# Patient Record
Sex: Male | Born: 1940 | Race: White | Hispanic: No | Smoking: Light tobacco smoker
Health system: Southern US, Community
[De-identification: ages and names within clinical notes are randomized; demographics above are authoritative.]

## PROBLEM LIST (undated history)

## (undated) DIAGNOSIS — F419 Anxiety disorder, unspecified: Secondary | ICD-10-CM

## (undated) DIAGNOSIS — G4730 Sleep apnea, unspecified: Secondary | ICD-10-CM

## (undated) DIAGNOSIS — J302 Other seasonal allergic rhinitis: Secondary | ICD-10-CM

## (undated) DIAGNOSIS — J42 Unspecified chronic bronchitis: Secondary | ICD-10-CM

## (undated) DIAGNOSIS — Z5189 Encounter for other specified aftercare: Secondary | ICD-10-CM

## (undated) DIAGNOSIS — Z87442 Personal history of urinary calculi: Secondary | ICD-10-CM

## (undated) DIAGNOSIS — H269 Unspecified cataract: Secondary | ICD-10-CM

## (undated) DIAGNOSIS — J449 Chronic obstructive pulmonary disease, unspecified: Secondary | ICD-10-CM

## (undated) DIAGNOSIS — I350 Nonrheumatic aortic (valve) stenosis: Secondary | ICD-10-CM

## (undated) DIAGNOSIS — M1990 Unspecified osteoarthritis, unspecified site: Secondary | ICD-10-CM

## (undated) DIAGNOSIS — N400 Enlarged prostate without lower urinary tract symptoms: Secondary | ICD-10-CM

## (undated) DIAGNOSIS — I2699 Other pulmonary embolism without acute cor pulmonale: Secondary | ICD-10-CM

## (undated) DIAGNOSIS — I2510 Atherosclerotic heart disease of native coronary artery without angina pectoris: Secondary | ICD-10-CM

## (undated) DIAGNOSIS — K635 Polyp of colon: Secondary | ICD-10-CM

## (undated) DIAGNOSIS — K219 Gastro-esophageal reflux disease without esophagitis: Secondary | ICD-10-CM

## (undated) DIAGNOSIS — I10 Essential (primary) hypertension: Secondary | ICD-10-CM

## (undated) DIAGNOSIS — T4145XA Adverse effect of unspecified anesthetic, initial encounter: Secondary | ICD-10-CM

## (undated) DIAGNOSIS — T8859XA Other complications of anesthesia, initial encounter: Secondary | ICD-10-CM

## (undated) DIAGNOSIS — N189 Chronic kidney disease, unspecified: Secondary | ICD-10-CM

## (undated) DIAGNOSIS — C4490 Unspecified malignant neoplasm of skin, unspecified: Secondary | ICD-10-CM

## (undated) DIAGNOSIS — E785 Hyperlipidemia, unspecified: Secondary | ICD-10-CM

## (undated) HISTORY — PX: SKIN SURGERY: SHX2413

## (undated) HISTORY — DX: Hyperlipidemia, unspecified: E78.5

## (undated) HISTORY — DX: Unspecified cataract: H26.9

## (undated) HISTORY — PX: SKIN CANCER EXCISION: SHX779

## (undated) HISTORY — DX: Encounter for other specified aftercare: Z51.89

## (undated) HISTORY — DX: Chronic kidney disease, unspecified: N18.9

## (undated) HISTORY — DX: Polyp of colon: K63.5

## (undated) HISTORY — DX: Unspecified malignant neoplasm of skin, unspecified: C44.90

## (undated) HISTORY — DX: Essential (primary) hypertension: I10

## (undated) HISTORY — DX: Nonrheumatic aortic (valve) stenosis: I35.0

## (undated) HISTORY — DX: Other pulmonary embolism without acute cor pulmonale: I26.99

## (undated) HISTORY — PX: RHINOPLASTY: SUR1284

## (undated) HISTORY — DX: Gastro-esophageal reflux disease without esophagitis: K21.9

## (undated) HISTORY — DX: Atherosclerotic heart disease of native coronary artery without angina pectoris: I25.10

## (undated) HISTORY — PX: OTHER SURGICAL HISTORY: SHX169

## (undated) HISTORY — PX: REPLACEMENT TOTAL KNEE: SUR1224

## (undated) HISTORY — PX: EYE SURGERY: SHX253

## (undated) HISTORY — PX: COLONOSCOPY: SHX174

---

## 1997-10-07 ENCOUNTER — Ambulatory Visit (HOSPITAL_COMMUNITY): Admission: RE | Admit: 1997-10-07 | Discharge: 1997-10-07 | Payer: Self-pay | Admitting: *Deleted

## 1997-12-14 ENCOUNTER — Ambulatory Visit: Admission: RE | Admit: 1997-12-14 | Discharge: 1997-12-14 | Payer: Self-pay | Admitting: Internal Medicine

## 1998-05-01 ENCOUNTER — Ambulatory Visit: Admission: RE | Admit: 1998-05-01 | Discharge: 1998-05-01 | Payer: Self-pay | Admitting: Otolaryngology

## 1998-05-20 HISTORY — PX: OTHER SURGICAL HISTORY: SHX169

## 1998-06-20 ENCOUNTER — Ambulatory Visit (HOSPITAL_COMMUNITY): Admission: RE | Admit: 1998-06-20 | Discharge: 1998-06-21 | Payer: Self-pay | Admitting: Otolaryngology

## 1998-09-12 ENCOUNTER — Ambulatory Visit: Admission: RE | Admit: 1998-09-12 | Discharge: 1998-09-12 | Payer: Self-pay | Admitting: Otolaryngology

## 2001-02-03 ENCOUNTER — Encounter (INDEPENDENT_AMBULATORY_CARE_PROVIDER_SITE_OTHER): Payer: Self-pay | Admitting: *Deleted

## 2001-02-03 ENCOUNTER — Ambulatory Visit (HOSPITAL_COMMUNITY): Admission: RE | Admit: 2001-02-03 | Discharge: 2001-02-03 | Payer: Self-pay | Admitting: Gastroenterology

## 2004-03-16 ENCOUNTER — Encounter (INDEPENDENT_AMBULATORY_CARE_PROVIDER_SITE_OTHER): Payer: Self-pay | Admitting: *Deleted

## 2004-03-16 ENCOUNTER — Ambulatory Visit (HOSPITAL_COMMUNITY): Admission: RE | Admit: 2004-03-16 | Discharge: 2004-03-16 | Payer: Self-pay | Admitting: *Deleted

## 2007-03-11 ENCOUNTER — Ambulatory Visit (HOSPITAL_COMMUNITY): Admission: RE | Admit: 2007-03-11 | Discharge: 2007-03-11 | Payer: Self-pay | Admitting: Otolaryngology

## 2007-03-11 ENCOUNTER — Encounter (INDEPENDENT_AMBULATORY_CARE_PROVIDER_SITE_OTHER): Payer: Self-pay | Admitting: Otolaryngology

## 2007-08-14 ENCOUNTER — Encounter (INDEPENDENT_AMBULATORY_CARE_PROVIDER_SITE_OTHER): Payer: Self-pay | Admitting: Otolaryngology

## 2007-08-14 ENCOUNTER — Ambulatory Visit (HOSPITAL_COMMUNITY): Admission: RE | Admit: 2007-08-14 | Discharge: 2007-08-15 | Payer: Self-pay | Admitting: Otolaryngology

## 2008-10-10 ENCOUNTER — Encounter: Admission: RE | Admit: 2008-10-10 | Discharge: 2008-10-10 | Payer: Self-pay | Admitting: Orthopedic Surgery

## 2009-02-16 DIAGNOSIS — G4733 Obstructive sleep apnea (adult) (pediatric): Secondary | ICD-10-CM | POA: Insufficient documentation

## 2009-02-16 DIAGNOSIS — K219 Gastro-esophageal reflux disease without esophagitis: Secondary | ICD-10-CM | POA: Insufficient documentation

## 2009-02-16 DIAGNOSIS — E785 Hyperlipidemia, unspecified: Secondary | ICD-10-CM | POA: Insufficient documentation

## 2009-02-16 DIAGNOSIS — E669 Obesity, unspecified: Secondary | ICD-10-CM | POA: Insufficient documentation

## 2009-07-06 DIAGNOSIS — J302 Other seasonal allergic rhinitis: Secondary | ICD-10-CM | POA: Insufficient documentation

## 2009-08-24 ENCOUNTER — Encounter (INDEPENDENT_AMBULATORY_CARE_PROVIDER_SITE_OTHER): Payer: Self-pay | Admitting: *Deleted

## 2009-09-19 ENCOUNTER — Encounter: Payer: Self-pay | Admitting: Gastroenterology

## 2009-09-20 ENCOUNTER — Encounter (INDEPENDENT_AMBULATORY_CARE_PROVIDER_SITE_OTHER): Payer: Self-pay | Admitting: *Deleted

## 2009-09-20 ENCOUNTER — Ambulatory Visit: Payer: Self-pay | Admitting: Gastroenterology

## 2009-10-06 ENCOUNTER — Ambulatory Visit: Payer: Self-pay | Admitting: Gastroenterology

## 2009-10-10 ENCOUNTER — Encounter: Payer: Self-pay | Admitting: Gastroenterology

## 2010-05-20 HISTORY — PX: JOINT REPLACEMENT: SHX530

## 2010-06-21 NOTE — Letter (Signed)
Summary: Patient Notice- Polyp Results   Gastroenterology  [address] [place], [place] [address]   Phone: [phone]  Fax: [phone]        Oct 10, 2009 MRN: [id number]    [name] [address] [place], [place]  [address]    Dear [name],  I am pleased to inform you that the colon polyp(s) removed during your recent colonoscopy was (were) found to be benign (no cancer detected) upon pathologic examination.  I recommend you have a repeat colonoscopy examination in _5 years to look for recurrent polyps, as having colon polyps increases your risk for having recurrent polyps or even colon cancer in the future.  Should you develop new or worsening symptoms of abdominal pain, bowel habit changes or bleeding from the rectum or bowels, please schedule an evaluation with either your primary care physician or with me.  Additional information/recommendations:  __ No further action with gastroenterology is needed at this time. Please      follow-up with your primary care physician for your other healthcare      needs.  __ Please call [phone] to schedule a return visit to review your      situation.  __ Please keep your follow-up visit as already scheduled.  _x_ Continue treatment plan as outlined the day of your exam.  Please call us if you are having persistent problems or have questions about your condition that have not been fully answered at this time.  Sincerely,  Louis Meckel MD  This letter has been electronically signed by your physician.  Appended Document: Patient Notice- Polyp Results letter mailed.

## 2010-06-21 NOTE — Letter (Signed)
Summary: [facility] Instructions  [facility]  [address] [place], [place] [address]   Phone: [phone]  Fax: [phone]       [name]    [birth year]    MRN: [id number]        Procedure Day Dorna Bloom:  Farrell Ours  10/06/09     Arrival Time:  7:30AM      Procedure Time:  8:00AM     Location of Procedure:                    Juliann Pares  [facility] (4th Floor)                        PREPARATION FOR COLONOSCOPY WITH MOVIPREP   Starting 5 days prior to your procedure 10/01/09 do not eat nuts, seeds, popcorn, corn, beans, peas,  salads, or any raw vegetables.  Do not take any fiber supplements (e.g. Metamucil, Citrucel, and Benefiber).  THE DAY BEFORE YOUR PROCEDURE         DATE: 10/05/09  DAY: THURSDAY  1.  Drink clear liquids the entire day-NO SOLID FOOD  2.  Do not drink anything colored red or purple.  Avoid juices with pulp.  No orange juice.  3.  Drink at least 64 oz. (8 glasses) of fluid/clear liquids during the day to prevent dehydration and help the prep work efficiently.  CLEAR LIQUIDS INCLUDE: Water Jello Ice Popsicles Tea (sugar ok, no milk/cream) Powdered fruit flavored drinks Coffee (sugar ok, no milk/cream) Gatorade Juice: apple, white grape, white cranberry  Lemonade Clear bullion, consomm, broth Carbonated beverages (any kind) Strained chicken noodle soup Hard Candy                             4.  In the morning, mix first dose of MoviPrep solution:    Empty 1 Pouch A and 1 Pouch B into the disposable container    Add lukewarm drinking water to the top line of the container. Mix to dissolve    Refrigerate (mixed solution should be used within 24 hrs)  5.  Begin drinking the prep at 5:00 p.m. The MoviPrep container is divided by 4 marks.   Every 15 minutes drink the solution down to the next mark (approximately 8 oz) until the full liter is complete.   6.  Follow completed prep with 16 oz of clear liquid of your choice (Nothing  red or purple).  Continue to drink clear liquids until bedtime.  7.  Before going to bed, mix second dose of MoviPrep solution:    Empty 1 Pouch A and 1 Pouch B into the disposable container    Add lukewarm drinking water to the top line of the container. Mix to dissolve    Refrigerate  THE DAY OF YOUR PROCEDURE      DATE: 10/06/09  DAY: FRIDAY  Beginning at 3:00AM (5 hours before procedure):         1. Every 15 minutes, drink the solution down to the next mark (approx 8 oz) until the full liter is complete.  2. Follow completed prep with 16 oz. of clear liquid of your choice.    3. You may drink clear liquids until 6:00AM (2 HOURS BEFORE PROCEDURE).   MEDICATION INSTRUCTIONS  Unless otherwise instructed, you should take regular prescription medications with a small sip of water   as early as possible the  morning of your procedure.         OTHER INSTRUCTIONS  You will need a responsible adult at least 68 years of age to accompany you and drive you home.   This person must remain in the waiting room during your procedure.  Wear loose fitting clothing that is easily removed.  Leave jewelry and other valuables at home.  However, you may wish to bring a book to read or  an iPod/MP3 player to listen to music as you wait for your procedure to start.  Remove all body piercing jewelry and leave at home.  Total time from sign-in until discharge is approximately 2-3 hours.  You should go home directly after your procedure and rest.  You can resume normal activities the  day after your procedure.  The day of your procedure you should not:   Drive   Make legal decisions   Operate machinery   Drink alcohol   Return to work  You will receive specific instructions about eating, activities and medications before you leave.    The above instructions have been reviewed and explained to me by   Wyona Almas RN  Sep 20, 2009 9:29 AM     I fully understand and can  verbalize these instructions _____________________________ Date _________

## 2010-06-21 NOTE — Letter (Signed)
Summary: Previsit letter  [facility]  [address] [place], [place] [address]   Phone: [phone]  Fax: [phone]       08/24/2009 MRN: [id number]  [name] [address] [place], [place]  [address]  Dear [name],  Welcome to the Gastroenterology Division at [facility].    You are scheduled to see a nurse for your pre-procedure visit on 09-22-09 at 9:00a.m. on the 3rd floor at [facility], [address]  We ask that you try to arrive at our office 15 minutes prior to your appointment time to allow for check-in.  Your nurse visit will consist of discussing your medical and surgical history, your immediate family medical history, and your medications.    Please bring a complete list of all your medications or, if you prefer, bring the medication bottles and we will list them.  We will need to be aware of both prescribed and over the counter drugs.  We will need to know exact dosage information as well.  If you are on blood thinners (Coumadin, Plavix, Aggrenox, Ticlid, etc.) please call our office today/prior to your appointment, as we need to consult with your physician about holding your medication.   Please be prepared to read and sign documents such as consent forms, a financial agreement, and acknowledgement forms.  If necessary, and with your consent, a friend or relative is welcome to sit-in on the nurse visit with you.  Please bring your insurance card so that we may make a copy of it.  If your insurance requires a referral to see a specialist, please bring your referral form from your primary care physician.  No co-pay is required for this nurse visit.     If you cannot keep your appointment, please call [phone] to cancel or reschedule prior to your appointment date.  This allows [place] the opportunity to schedule an appointment for another patient in need of care.    Thank you for choosing [facility] for your medical needs.  We  appreciate the opportunity to care for you.  Please visit [place] at our website  to learn more about our practice.                     Sincerely.                                                                                                                   The Gastroenterology Division

## 2010-06-21 NOTE — Procedures (Signed)
Summary: Colonoscopy  Patient: [name] Note: All result statuses are Final unless otherwise noted.  Tests: (1) Colonoscopy (COL)   COL Colonoscopy           DONE     [facility]     [address]     [place], [place]  [address]           COLONOSCOPY PROCEDURE REPORT           PATIENT:  Derek, Bender  MR#:  [id number]     BIRTHDATE:  [birth year], 69 yrs. old  GENDER:  male           ENDOSCOPIST:  Barbette Hair. Arlyce Dice, MD     Referred by:           PROCEDURE DATE:  10/06/2009     PROCEDURE:  Colonoscopy with snare polypectomy     ASA CLASS:  Class II     INDICATIONS:  1) screening  2) history of pre-cancerous     (adenomatous) colon polyps           MEDICATIONS:   Fentanyl 75 mcg IV, Versed 6 mg IV           DESCRIPTION OF PROCEDURE:   After the risks benefits and     alternatives of the procedure were thoroughly explained, informed     consent was obtained.  Digital rectal exam was performed and     revealed no abnormalities.   The LB CF-[id number] endoscope     was introduced through the anus and advanced to the cecum, which     was identified by both the appendix and ileocecal valve, without     limitations.  The quality of the prep was good, using MoviPrep.     The instrument was then slowly withdrawn as the colon was fully     examined.     <<PROCEDUREIMAGES>>           FINDINGS:  A sessile polyp was found in the mid transverse colon.     It was 3 mm in size. Polyp was snared without cautery. Retrieval     was successful (see image6). snare polyp  Moderate diverticulosis     was found in the sigmoid to descending colon segments (see     image2).  This was otherwise a normal examination of the colon     (see image3, image4, image5, image7, image11, and image12).     Retroflexed views in the rectum revealed no abnormalities.    The     time to cecum =  3.25  minutes. The scope was then withdrawn (time     =  8.25  min) from the patient and the procedure  completed.           COMPLICATIONS:  None           ENDOSCOPIC IMPRESSION:     1) 3 mm sessile polyp in the mid transverse colon     2) Moderate diverticulosis in the sigmoid to descending colon     segments     3) Otherwise normal examination     RECOMMENDATIONS:     1) If the polyp(s) removed today are proven to be adenomatous     (pre-cancerous) polyps, you will need a repeat colonoscopy in 5     years. Otherwise you should continue to follow colorectal cancer     screening guidelines for "routine risk" patients with colonoscopy     in  10 years.           REPEAT EXAM:   You will receive a letter from Dr. Arlyce Dice in 1-2     weeks, after reviewing the final pathology, with followup     recommendations.           ______________________________     Barbette Hair Arlyce Dice, MD           CC: Creola Corn, MD           n.     Rosalie Doctor:   Barbette Hair. Alioune Hodgkin at 10/06/2009 08:54 AM           Griffith Citron, [id number]  Note: An exclamation mark (!) indicates a result that was not dispersed into the flowsheet. Document Creation Date: 10/06/2009 1:52 PM _______________________________________________________________________  (1) Order result status: Final Collection or observation date-time: 10/06/2009 08:34 Requested date-time:  Receipt date-time:  Reported date-time:  Referring Physician:   Ordering Physician: Melvia Heaps [phone]) Specimen Source:  Source: Launa Grill Order Number: [id number] Lab site:   Appended Document: Colonoscopy     Procedures Next Due Date:    Colonoscopy: 09/2014

## 2010-06-21 NOTE — Miscellaneous (Signed)
Summary: [facility] Previsit/prep  Clinical Lists Changes  Medications: Added new medication of MOVIPREP 100 GM  SOLR (PEG-KCL-NACL-NASULF-NA ASC-C) As per prep instructions. - Signed Rx of MOVIPREP 100 GM  SOLR (PEG-KCL-NACL-NASULF-NA ASC-C) As per prep instructions.;  #1 x 0;  Signed;  Entered by: Wyona Almas RN;  Authorized by: Mardella Layman MD [facility];  Method used: Electronically to [facility] [address] [place], [place]  [id number], Ph: [phone] or [phone], Fax: [phone] Allergies: Added new allergy or adverse reaction of PENICILLIN Added new allergy or adverse reaction of CECLOR Observations: Added new observation of NKA: F (09/20/2009 8:53)    Prescriptions: MOVIPREP 100 GM  SOLR (PEG-KCL-NACL-NASULF-NA ASC-C) As per prep instructions.  #1 x 0   Entered by:   Wyona Almas RN   Authorized by:   Mardella Layman MD [facility]   Signed by:   Wyona Almas RN on 09/20/2009   Method used:   Electronically to        [facility] (retail)       [address]       [place], [place]  [id number]       Ph: [phone] or [phone]       Fax: [phone]   RxID:   [id number]

## 2010-06-21 NOTE — Letter (Signed)
Summary: Pre Visit No Show Letter  [facility]  [address] [place], [place] [address]   Phone: [phone]  Fax: [phone]        Sep 19, 2009 MRN: [id number]    [name] [address] [place], [place]  [address]    Dear Mr. HOADLEY,   We have been unable to reach you by phone concerning the pre-procedure visit that you missed on 09/19/09. For this reason,your procedure scheduled on 10/06/09 has been cancelled. Our scheduling staff will gladly assist you with rescheduling your appointments at a more convenient time. Please call our office at [phone] between the hours of 8:00am and 5:00pm, press option #2 to reach an appointment scheduler. Please consider updating your contact numbers at this time so that we can reach you by phone in the future with schedule changes or results.    Thank you,    Wyona Almas RN [facility]

## 2010-07-11 DIAGNOSIS — Z85820 Personal history of malignant melanoma of skin: Secondary | ICD-10-CM | POA: Insufficient documentation

## 2010-07-11 DIAGNOSIS — C069 Malignant neoplasm of mouth, unspecified: Secondary | ICD-10-CM | POA: Insufficient documentation

## 2010-07-11 DIAGNOSIS — E559 Vitamin D deficiency, unspecified: Secondary | ICD-10-CM | POA: Insufficient documentation

## 2010-10-02 NOTE — Op Note (Signed)
NAME:  [name], [name]                 ACCOUNT NO.:  [id number]   MEDICAL RECORD NO.:  [id number]          PATIENT TYPE:  AMB   LOCATION:  SDS                          FACILITY:  [facility]   PHYSICIAN:  Kinnie Scales. Annalee Genta, M.D.DATE OF BIRTH:  1941/04/11   DATE OF PROCEDURE:  03/11/2007  DATE OF DISCHARGE:                               OPERATIVE REPORT   PREOPERATIVE DIAGNOSIS:  Anterior floor of mouth carcinoma.   POSTOPERATIVE DIAGNOSIS:  Anterior floor of mouth carcinoma.   SURGICAL PROCEDURES:  CO2 laser excision of 1 x 2 cm superficial  anterior floor of mouth lesion with cannulation of submandibular ducts.   SURGEON:  Kinnie Scales. Annalee Genta, M.D.   ANESTHESIA:  General.   COMPLICATIONS:  None.   BLOOD LOSS:  Less than 50 mL.   DISPOSITION:  The patient was transferred from the operating room to the  recovery room in stable condition.   BRIEF HISTORY:  [name] is a 70 year old white male who has been  followed with a history of obstructive sleep apnea and has undergone  prior sleep apnea surgery.  He underwent evaluation by his dentist for  an anterior floor of mouth lesion.  The patient has been a chronic cigar  smoker and was found to have an area of ulcerated mucosa in the anterior  floor of mouth. Biopsy showed carcinoma in situ and he was referred to  our office for re-evaluation and further surgical intervention.  Examination in the office revealed a superficial mucosal lesion in the  anterior floor of mouth at the junction of the submandibular ducts. The  patient has normal tongue mobility and no other active head and neck  findings.  No lymphadenopathy or mass.  Given his history, I recommended  excisional biopsy of the area under general anesthesia. The risks,  benefits, and possible complications of the procedure were discussed in  detail and the patient understood and concurred with our plan which was  scheduled as above.   SURGICAL PROCEDURE:  The patient was brought  to the operating room on  March 11, 2007, and placed in a supine position on the operating  table.  General endotracheal anesthesia was established without  difficulty.  When the patient was adequately anesthetized and positioned  on the operating table, he was prepped and draped in a sterile fashion.  The patient's mouth was restrained using an anterior mouth guard, the  tongue was elevated, and the floor of mouth was inspected.  The patient  had a small erythroplakic lesion in the anterior floor of mouth adjacent  to the papilla of the submandibular ducts bilaterally.  The patient had  good flow through the ducts prior to the surgical procedure. The area  around the lesion was demarcated with a CO2 laser and excision was  carried out through the mucosa and underlying deep submucosa using the  CO2 laser. The deep component of the dissection was then transected  using the laser.  The submandibular ducts were identified and cannulated  bilaterally. The ducts were then opened using a #15 scalpel and the  lateral portions of  the duct mucosa were sutured to the lateral incision  sites bilaterally.  The patient had good flow through the ducts. The  anterior mucosa was then reapproximated with interrupted 4-0 gut suture  on a Keith needle.  The patient's oral cavity and oropharynx were  irrigated and suctioned.  There was no bleeding.  The orogastric tube was passed, the stomach  contents were aspirated.  The patient was awakened from his anesthetic,  he was extubated, and transferred from the operating room to the  recovery room in stable condition.           ______________________________  Kinnie Scales Annalee Genta, M.D.     DLS/MEDQ  D:  16/02/9603  T:  03/11/2007  Job:  [id number]

## 2010-10-02 NOTE — Op Note (Signed)
NAME:  [name], [name] NO.:  [id number]   MEDICAL RECORD NO.:  [id number]          PATIENT TYPE:  OIB   LOCATION:  5128                         FACILITY:  [facility]   PHYSICIAN:  Kinnie Scales. Annalee Genta, M.D.DATE OF BIRTH:  03-Jun-1940   DATE OF PROCEDURE:  08/14/2007  DATE OF DISCHARGE:                               OPERATIVE REPORT   PREOPERATIVE DIAGNOSES:  1. Right submandibular gland obstruction.  2. Status post excision, anterior floor of mouth cancer (October      2008).   POSTOPERATIVE DIAGNOSES:  1. Right submandibular gland obstruction.  2. Status post excision, anterior floor of mouth cancer (October      2008).   INDICATIONS FOR SURGERY:  1. Right submandibular gland obstruction.  2. Status post excision, anterior floor of mouth cancer (October      2008).   SURGICAL PROCEDURE:  Excision, right submandibular gland.   ANESTHESIA:  General endotracheal.   SURGEON:  Kinnie Scales. Annalee Genta, M.D.   COMPLICATIONS:  None.   ESTIMATED BLOOD LOSS:  Approximately 50 mL.   The patient was transferred from the operating room to the recovery room  in stable condition.   BRIEF HISTORY:  Mr. 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  The  patient underwent excision of an approximately 2-cm anterior floor of  mouth lesion in October 2008.  The submandibular ducts were involved  with the lesion bilaterally.  At the time of the procedure, care was  taken to divert the ducts to maintain adequate outflow.  On the left-  hand side the outcome was positive but on the right the patient had  scarring and significant recurrent obstruction with chronic symptoms of  fullness, swelling and pain.  Given the patient's history and  examination, we discussed various treatment options and he opted to  undergo excision of the submandibular gland for management of chronic  obstruction.  The risks, benefits  and possible complications of  procedure were discussed in detail.  The patient understood and  concurred with our plan for surgery, which is scheduled for August 14, 2007.   PROCEDURE:  The patient was brought to the operating room at [facility] OR, placed in supine position on the operating table.  General endotracheal anesthesia was established without difficulty and  when the patient was adequately anesthetized, he was positioned on the  operating table and prepped and draped in a sterile fashion.  He was  injected with 2 mL of 1% lidocaine and 1:100,000 solution of  epinephrine, which was injected in skin overlying the proposed skin  incision along the right submandibular space.  The surgical procedure  was then begun by creating an approximately 3-cm horizontally-oriented  skin incision in a preexisting skin crease.  This was carried through  the skin and deep subcutaneous tissue using a #15 scalpel blade.  The  soft tissue was retracted, subcutaneous fat was dissected, and the  platysma muscle was identified and divided.  Dissection was then carried  out along the  submandibular space with gentle elevation of the soft  tissue.  The marginal mandibular nerve was identified.  It was crossed  at the inferior aspect of the gland and it was carefully dissected  proximally and distally to preserve it throughout its course.  With the  inferior aspect of the gland visualized, the fascia overlying the gland,  which also included the previously-dissected marginal mandibular nerve,  was reflected superiorly.  Dissection then carried out in the  submandibular space.  The anterior and posterior bellies of the  digastric muscle were identified.  The inferior aspect the gland was  gently dissected free from the deep tissues.  The hypoglossal nerve was  identified and preserved throughout its course.  Dissection was then  carried out anteriorly, where the mylohyoid muscle was  identified.  This  was elevated and the anterior aspects of the gland were dissected.  The  duct was identified and palpated and traced anteriorly.  He was divided  and suture ligated.  Attention was then turned to the superior aspect of  the gland, where vascular contributions were divided and suture ligated.  The lingual nerve was identified.  Its contribution to the gland was  divided and suture ligated, carefully preserving the nerve.  Posteriorly, the common facial vein was identified and divided and  suture ligated in order to allow access to the posterior aspect of the  gland.  There was a moderate amount of dense adhesion, which was  carefully dissected.  The gland was removed and sent to pathology for  gross and microscopic evaluation.  The patient's surgical wound was then  thoroughly irrigated with sterile saline solution.  There was no  bleeding.  A 7-French round drain was then placed the depth of the  incision and carried out through the posterior aspect of the skin  incision and sutured in position with 4-0 Ethilon suture.  The wound was  then closed in multiple layers beginning with approximation of the  platysma muscle with 4-0 Vicryl suture, deep subcutaneous tissue closed  with the same stitch, and final skin edge was closed with 5-0 Ethilon  suture in a running locked fashion.  The wound was dressed with  bacitracin ointment.  The patient was then awakened from his anesthetic.  He was extubated and was transferred from the operating room to the  recovery room in stable condition.  There were no complications.  Blood  loss was approximately 50 mL.           ______________________________  Kinnie Scales. Annalee Genta, M.D.     DLS/MEDQ  D:  16/02/9603  T:  08/15/2007  Job:  [id number]

## 2010-12-20 ENCOUNTER — Encounter (HOSPITAL_COMMUNITY)
Admission: RE | Admit: 2010-12-20 | Discharge: 2010-12-20 | Disposition: A | Discharge status: Home / Self Care | Payer: Medicare Other | Source: Ambulatory Visit | Attending: Orthopedic Surgery | Admitting: Orthopedic Surgery

## 2010-12-20 LAB — BASIC METABOLIC PANEL
CO2: 28 mEq/L (ref 19–32)
Calcium: 9.6 mg/dL (ref 8.4–10.5)
Chloride: 103 mEq/L (ref 96–112)
GFR calc Af Amer: >60 mL/min (ref >60)
GFR calc non Af Amer: 53 mL/min — ABNORMAL LOW (ref >60)
Potassium: 4.3 mEq/L (ref 3.5–5.1)
Sodium: 140 mEq/L (ref 135–145)

## 2010-12-20 LAB — TYPE AND SCREEN: ABO/RH(D): AB POS

## 2010-12-20 LAB — CBC
HCT: 45.7 % (ref 39.0–52.0)
WBC: 7.3 10*3/uL (ref 4.0–10.5)

## 2010-12-20 LAB — URINALYSIS, ROUTINE W REFLEX MICROSCOPIC
Glucose, UA: NEGATIVE mg/dL
Hgb urine dipstick: NEGATIVE
Nitrite: NEGATIVE
pH: 6.5 (ref 5.0–8.0)

## 2010-12-20 LAB — DIFFERENTIAL
Basophils Absolute: 0 10*3/uL (ref 0.0–0.1)
Basophils Relative: 0 % (ref 0–1)
Eosinophils Absolute: 0.2 10*3/uL (ref 0.0–0.7)
Eosinophils Relative: 2 % (ref 0–5)
Lymphs Abs: 2 10*3/uL (ref 0.7–4.0)
Monocytes Relative: 7 % (ref 3–12)
Neutro Abs: 4.6 10*3/uL (ref 1.7–7.7)
Neutrophils Relative %: 63 % (ref 43–77)

## 2010-12-20 LAB — APTT: aPTT: 27 seconds (ref 24–37)

## 2010-12-28 ENCOUNTER — Inpatient Hospital Stay (HOSPITAL_COMMUNITY)
Admission: RE | Admit: 2010-12-28 | Discharge: 2010-12-31 | DRG: 470 | Disposition: A | Discharge status: Home Health | Payer: Medicare Other | Source: Ambulatory Visit | Attending: Orthopedic Surgery | Admitting: Orthopedic Surgery

## 2010-12-28 ENCOUNTER — Inpatient Hospital Stay (HOSPITAL_COMMUNITY): Payer: Medicare Other

## 2010-12-28 DIAGNOSIS — M1710 Unilateral primary osteoarthritis, unspecified knee: Principal | ICD-10-CM | POA: Diagnosis present

## 2010-12-28 DIAGNOSIS — Z01812 Encounter for preprocedural laboratory examination: Secondary | ICD-10-CM

## 2010-12-28 DIAGNOSIS — I10 Essential (primary) hypertension: Secondary | ICD-10-CM | POA: Diagnosis present

## 2010-12-28 DIAGNOSIS — Z79899 Other long term (current) drug therapy: Secondary | ICD-10-CM

## 2010-12-28 DIAGNOSIS — F1010 Alcohol abuse, uncomplicated: Secondary | ICD-10-CM | POA: Diagnosis present

## 2010-12-28 DIAGNOSIS — Z7982 Long term (current) use of aspirin: Secondary | ICD-10-CM

## 2010-12-28 DIAGNOSIS — Z880 Allergy status to penicillin: Secondary | ICD-10-CM

## 2010-12-28 DIAGNOSIS — Z7901 Long term (current) use of anticoagulants: Secondary | ICD-10-CM

## 2010-12-28 DIAGNOSIS — K219 Gastro-esophageal reflux disease without esophagitis: Secondary | ICD-10-CM | POA: Diagnosis present

## 2010-12-28 DIAGNOSIS — E669 Obesity, unspecified: Secondary | ICD-10-CM | POA: Diagnosis present

## 2010-12-28 DIAGNOSIS — E785 Hyperlipidemia, unspecified: Secondary | ICD-10-CM | POA: Diagnosis present

## 2010-12-28 DIAGNOSIS — F17200 Nicotine dependence, unspecified, uncomplicated: Secondary | ICD-10-CM | POA: Diagnosis present

## 2010-12-29 LAB — CBC
MCHC: 32.4 g/dL (ref 30.0–36.0)
MCV: 93.5 fL (ref 78.0–100.0)
RDW: 13.7 % (ref 11.5–15.5)
WBC: 9.9 10*3/uL (ref 4.0–10.5)

## 2010-12-29 LAB — BASIC METABOLIC PANEL
BUN: 13 mg/dL (ref 6–23)
CO2: 27 mEq/L (ref 19–32)
Chloride: 103 mEq/L (ref 96–112)
GFR calc Af Amer: >60 mL/min (ref >60)
Potassium: 4 mEq/L (ref 3.5–5.1)

## 2010-12-30 LAB — PROTIME-INR: INR: 1.31 (ref 0.00–1.49)

## 2010-12-30 LAB — BASIC METABOLIC PANEL
BUN: 11 mg/dL (ref 6–23)
CO2: 26 mEq/L (ref 19–32)
Calcium: 9 mg/dL (ref 8.4–10.5)
Chloride: 101 mEq/L (ref 96–112)
Chloride: 103 mEq/L (ref 96–112)
Creatinine, Ser: 0.99 mg/dL (ref 0.50–1.35)
Creatinine, Ser: 1.01 mg/dL (ref 0.50–1.35)
GFR calc Af Amer: >60 mL/min (ref >60)
GFR calc Af Amer: >60 mL/min (ref >60)
GFR calc non Af Amer: >60 mL/min (ref >60)
Glucose, Bld: 129 mg/dL — ABNORMAL HIGH (ref 70–99)
Potassium: 3.6 mEq/L (ref 3.5–5.1)
Sodium: 137 mEq/L (ref 135–145)

## 2010-12-30 LAB — CBC
MCH: 30.6 pg (ref 26.0–34.0)
MCHC: 33.1 g/dL (ref 30.0–36.0)
Platelets: 142 10*3/uL — ABNORMAL LOW (ref 150–400)
RDW: 13.6 % (ref 11.5–15.5)
WBC: 10.1 10*3/uL (ref 4.0–10.5)

## 2010-12-31 LAB — PROTIME-INR: INR: 1.48 (ref 0.00–1.49)

## 2010-12-31 LAB — BASIC METABOLIC PANEL
CO2: 30 mEq/L (ref 19–32)
Calcium: 9.1 mg/dL (ref 8.4–10.5)
Glucose, Bld: 111 mg/dL — ABNORMAL HIGH (ref 70–99)
Potassium: 4 mEq/L (ref 3.5–5.1)
Sodium: 137 mEq/L (ref 135–145)

## 2010-12-31 LAB — CBC
HCT: 39.1 % (ref 39.0–52.0)
Platelets: 176 10*3/uL (ref 150–400)
WBC: 11 10*3/uL — ABNORMAL HIGH (ref 4.0–10.5)

## 2011-01-04 NOTE — Op Note (Signed)
[name], [name] NO.:  [id number]  MEDICAL RECORD NO.:  [id number]  LOCATION:  2550                         FACILITY:  [facility]  PHYSICIAN:  [name], M.D. DATE OF BIRTH:  1941/03/26  DATE OF PROCEDURE:  12/28/2010 DATE OF DISCHARGE:                              OPERATIVE REPORT   PREOPERATIVE DIAGNOSIS:  Left knee end-stage osteoarthritis.  POSTOPERATIVE DIAGNOSIS:  Left knee end-stage osteoarthritis.  PROCEDURE PERFORMED:  Left total knee replacement using DePuy segmental rotating platform prosthesis.  ATTENDING SURGEON:  Derek Balls. Ranell Patrick, MD.  ASSISTANT:  [name]. Dixon, PA.  ANESTHESIA:  General anesthesia was used plus femoral block.  ESTIMATED BLOOD LOSS:  Minimal.  FLUID REPLACED:  2100 mL of crystalloid.  TOURNIQUET TIME:  One hour and 10 minutes at 350 mmHg.  INSTRUMENT COUNTS:  Correct.  There were no complications.  Perioperative antibiotics were given.  INDICATIONS:  The patient is a 70 year old male with worsening left knee pain secondary to end-stage osteoarthritis.  The patient has had progressive pain despite conservative management.  The patient desires operative treatment to restore function and eliminate pain.  Informed consent was obtained.  DESCRIPTION OF PROCEDURE:  After an adequate level of anesthesia was achieved, the patient was positioned in the supine position.  The left leg was correctly identified.  Nonsterile tourniquet was placed on the left proximal thigh.  Bump was used in the left hip.  We padded the right leg appropriately and secured to the table.  After sterile prep and drape of the left leg, we exsanguinated the limb using an Esmarch bandage, elevated the tourniquet to 350 mmHg.  A longitudinal midline incision was created with the knee in flexion.  Dissection down through subcutaneous tissues using the knife.  A fresh 10-blade was utilized for medial parapatellar arthrotomy.  We everted the  patella, divide the lateral patellofemoral ligaments.  We then entered the distal femur with a step-cut drill and then placed our intramedullary distal femoral resection guide, set on 5 degrees left with 10 mm of resection.  We then sized our femur to size 4, anterior down, performed our anterior- posterior chamfer cuts using the 4-in-1 block.  Next, we directed our attention towards the tibia.  We resected ACL, PCL, and meniscal tissue, subluxed the tibia anteriorly with a McCullough retractor, and then we went ahead and resect the tibia, 2 mm off the affected medial side, perpendicular long axis of the tibia with minimal posterior slope using a posterior cruciate-substituting prosthesis. At this point, we checked our gaps which were symmetric at 12.5 mm both in flexion and extension. We then went ahead and resected extra posterior bone off the posterior femoral condyles with lamina spreader for visualization.  We then went ahead and completed our tibial preparation with a modular drill and keel punch for the rotating platform tibial prosthesis and then did our box cut for the femur for the posterior cruciate-substituting femoral prosthesis.  We impacted size 4 tibia size 4 femur, reduced with a size 12.5 insert, and we were able to obtain full extension and excellent stability both in extension and flexion initially, and then went ahead and resurfaced the  patella going from a size 23 mm down to a size 16. At this point, we went ahead and drilled for the 38 patella and placed a 38 patellar button in place.  We had a nice tracking through full range of motion with no-hands technique.  We removed all trial components, pulse irrigated the knee thoroughly, and then cemented the components into place, size 4 tibia size 4 femur left, and then a 12.5 trial insert and then placed a clamp.  Once the cement was allowed to harden, we removed the excess cement with Cornish curved osteotome.  We  trialed with a 15 and felt like the 15 give little bit better flexion stability, but still get full extension, so we placed 15 real polyethylene insert in and reduced the knee.  We were happy with patellar tracking and stability.  We thoroughly irrigated the knee and closed the knee in the following manner.  We used a #1 Vicryl for the interrupted medial parapatellar arthrotomy repair and then closed with a knee in flexion, and then we closed subcutaneous tissues with 2-0 Vicryl followed by 4-0 running Monocryl for skin.  Steri-Strips and sterile dressing were applied.  The patient was placed in a knee immobilizer.     [name], M.D.     SRN/MEDQ  D:  12/28/2010  T:  12/28/2010  Job:  [id number]  Electronically Signed by Malon Kindle  on 01/04/2011 10:26:22 AM

## 2011-01-29 NOTE — Discharge Summary (Signed)
  [name], LADAY NO.:  [id number]  MEDICAL RECORD NO.:  [id number]  LOCATION:  5010                         FACILITY:  [facility]  PHYSICIAN:  Almedia Balls. Ranell Patrick, M.D. DATE OF BIRTH:  1940/11/16  DATE OF ADMISSION:  12/28/2010 DATE OF DISCHARGE:  12/31/2010                              DISCHARGE SUMMARY   ADMISSION DIAGNOSIS:  Left knee pain secondary to end-stage osteoarthritis.  DISCHARGE DIAGNOSIS:  Left knee pain secondary to end-stage osteoarthritis, status post total knee arthroplasty.  BRIEF HISTORY:  The patient is a 70 year old male with worsening left knee pain secondary to end-stage osteoarthritis.  The patient has elected for total knee replacement to decrease pain and increase function of the left lower extremity.  PROCEDURE:  The patient had left total knee arthroplasty by Dr. Malon Kindle on December 28, 2010.  Assistant was [employer], PA-C.  General anesthesia was used.  No complications.  HOSPITAL COURSE:  The patient was admitted on December 28, 2010, for the above-stated procedure, which he tolerated well.  After adequate time in the Postanesthesia Care Unit, he was transferred to 5000.  On postop day #1, the patient complained of some mild pain with actually had some bouts of hypertension, like 200/100s, which he is normally normotensive, around 120/80.  The patient was asymptomatic throughout the entire episode.  There was no signs of headaches, stroke, dizziness, blurred vision, diaphoresis, chest pains, or cough.  The patient was treated with antihypertensives and his blood pressure became under control after hospital consult.  We appreciate Internal Medicine seeing [name]. The patient overall was doing great and well with physical therapy; and after postop day #3, he was discharged to home.  DISCHARGE PLAN:  The patient was discharged to home on home health physical therapy.  His condition is stable.  His diet is  regular.  DISCHARGE MEDICATIONS: 1. Robaxin 500 mg p.o. q.6 h. 2. Percocet 5/325 one to two tablets q.4-6 h. p.r.n. pain. 3. Coumadin per pharmacy protocol. 4. Norvasc 5 mg daily. 5. Aspirin 81 mg daily. 6. Cozaar 50 mg daily. 7. Probenecid one tablet daily. 8. Testosterone 5 grams three times a day as needed.  FOLLOWUP:  The patient will follow back up with Dr. Malon Kindle in 2 weeks.     Thomas B. Dixon, P.A.   ______________________________ Almedia Balls. Ranell Patrick, M.D.    TBD/MEDQ  D:  12/31/2010  T:  12/31/2010  Job:  [id number]  Electronically Signed by Standley Dakins P.A. on 01/08/2011 09:10:45 AM Electronically Signed by Malon Kindle  on 01/29/2011 03:08:28 AM

## 2011-01-29 NOTE — H&P (Signed)
  NAMEREYES, ALDACO NO.:  [id number]  MEDICAL RECORD NO.:  [id number]  LOCATION:                                 FACILITY:  PHYSICIAN:  Almedia Balls. Ranell Patrick, M.D. DATE OF BIRTH:  Apr 20, 1941  DATE OF ADMISSION:  12/25/2010 DATE OF DISCHARGE:                             HISTORY & PHYSICAL   CHIEF COMPLAINT:  Left knee pain.  HISTORY OF PRESENT ILLNESS:  The patient is a 70 year old male with worsening left knee pain secondary to end-stage osteoarthritis.  The patient has elected to have left total knee arthroplasty by Dr. Malon Kindle to decrease pain and increase function.  PAST MEDICAL HISTORY:  Hypertension, sleep apnea, and hyperlipidemia.  FAMILY MEDICAL HISTORY:  Negative.  SOCIAL HISTORY:  The patient does smoke and use alcohol.  No illegal drugs.  The patient of Gwen Pounds, MD  DRUG ALLERGIES:  PENICILLIN and CECLOR.  CURRENT MEDICATIONS: 1. Probenecid 500 mg daily. 2. Losartan 2 mg daily. 3. TriCor 145 mg daily. 4. Cialis 20 mg p.r.n. 5. Nexium 40 mg daily. 6. Fish oil. 7. Centrum.  The patient was seen treated with home for wound for pain.  REVIEW OF SYSTEMS:  Pain on range of motion of bilateral knees with increased crepitus.  PHYSICAL EXAMINATION:  VITAL SIGNS:  Pulse 68, respirations 16, blood pressure 120/78. GENERAL:  The patient is a healthy-appearing 70 year old male, in no acute distress.  Pleasant mood and affect. Alert and oriented x3. HEAD and NECK:  Cranial nerves II-XII were grossly intact.  Neck shows full range of motion without any tenderness. CHEST:  Active breath sounds bilaterally.  No wheezes, rhonchi, or rales. HEART:  Regular rate and rhythm.  No murmur. ABDOMEN:  Nontender, nondistended with active bowel sounds. EXTREMITIES:  Moderate tenderness of the left knee with range of motion. Neurovascularly, he is intact distally.  No edema.  X-rays showed end-stage osteoarthritis, left knee.  IMPRESSION:   End-stage osteoarthritis, left knee.  PLAN OF ACTION:  Left total knee arthroplasty by Almedia Balls. Ranell Patrick, MD     Donnie Coffin. Dixon, P.A.   ______________________________ Almedia Balls. Ranell Patrick, M.D.    TBD/MEDQ  D:  12/25/2010  T:  12/25/2010  Job:  [id number]  Electronically Signed by Standley Dakins P.A. on 01/08/2011 09:10:50 AM Electronically Signed by Malon Kindle  on 01/29/2011 03:08:32 AM

## 2011-02-11 LAB — CBC
HCT: 41.6
Hemoglobin: 14.6
MCHC: 35
RBC: 4.57

## 2011-02-11 LAB — BASIC METABOLIC PANEL
CO2: 26
Calcium: 9.4
GFR calc Af Amer: >60
GFR calc non Af Amer: 56 — ABNORMAL LOW
Glucose, Bld: 108 — ABNORMAL HIGH
Potassium: 4.4
Sodium: 139

## 2011-02-27 LAB — CBC
HCT: 40.4
Hemoglobin: 13.9
MCHC: 34.3
MCV: 90.8
Platelets: 262
RDW: 13.5

## 2011-02-27 LAB — BASIC METABOLIC PANEL
BUN: 15
CO2: 25
Glucose, Bld: 98
Potassium: 4.1
Sodium: 134 — ABNORMAL LOW

## 2011-05-08 ENCOUNTER — Ambulatory Visit: Payer: Medicare Other | Admitting: Physician Assistant

## 2011-05-08 ENCOUNTER — Ambulatory Visit (INDEPENDENT_AMBULATORY_CARE_PROVIDER_SITE_OTHER): Payer: Medicare Other | Admitting: Physician Assistant

## 2011-05-08 ENCOUNTER — Encounter: Payer: Self-pay | Admitting: Physician Assistant

## 2011-05-08 ENCOUNTER — Telehealth: Payer: Self-pay

## 2011-05-08 ENCOUNTER — Other Ambulatory Visit (INDEPENDENT_AMBULATORY_CARE_PROVIDER_SITE_OTHER): Payer: Medicare Other

## 2011-05-08 VITALS — BP 142/82 | HR 112 | Ht 70.0 in | Wt 236.0 lb

## 2011-05-08 DIAGNOSIS — I739 Peripheral vascular disease, unspecified: Secondary | ICD-10-CM | POA: Insufficient documentation

## 2011-05-08 DIAGNOSIS — R197 Diarrhea, unspecified: Secondary | ICD-10-CM

## 2011-05-08 DIAGNOSIS — Z86010 Personal history of colonic polyps: Secondary | ICD-10-CM

## 2011-05-08 DIAGNOSIS — I10 Essential (primary) hypertension: Secondary | ICD-10-CM

## 2011-05-08 DIAGNOSIS — Z8719 Personal history of other diseases of the digestive system: Secondary | ICD-10-CM

## 2011-05-08 DIAGNOSIS — Z860101 Personal history of adenomatous and serrated colon polyps: Secondary | ICD-10-CM

## 2011-05-08 DIAGNOSIS — K5730 Diverticulosis of large intestine without perforation or abscess without bleeding: Secondary | ICD-10-CM | POA: Insufficient documentation

## 2011-05-08 DIAGNOSIS — M109 Gout, unspecified: Secondary | ICD-10-CM | POA: Insufficient documentation

## 2011-05-08 DIAGNOSIS — Z96659 Presence of unspecified artificial knee joint: Secondary | ICD-10-CM

## 2011-05-08 DIAGNOSIS — K5790 Diverticulosis of intestine, part unspecified, without perforation or abscess without bleeding: Secondary | ICD-10-CM

## 2011-05-08 LAB — CBC WITH DIFFERENTIAL/PLATELET
Basophils Absolute: 0 10*3/uL (ref 0.0–0.1)
Eosinophils Absolute: 0.1 10*3/uL (ref 0.0–0.7)
Lymphocytes Relative: 28.5 % (ref 12.0–46.0)
MCHC: 34.4 g/dL (ref 30.0–36.0)
Neutro Abs: 4.3 10*3/uL (ref 1.4–7.7)
Neutrophils Relative %: 58.6 % (ref 43.0–77.0)
Platelets: 178 10*3/uL (ref 150.0–400.0)
RDW: 15.1 % — ABNORMAL HIGH (ref 11.5–14.6)

## 2011-05-08 NOTE — Patient Instructions (Addendum)
You have been given a separate informational sheet regarding your tobacco use, the importance of quitting and local resources to help you quit.  Call us back this afternoon if bleeding occurs.  [phone].  Come to our lab tomorrow AM 05-09-2011. If you are feeling bad come to the 3rd floor and ask for Pam, assistant to [employer].

## 2011-05-08 NOTE — Telephone Encounter (Signed)
Dr Christella Hartigan left a voice mail about this pt, the pt called last night with bloody diarrhea and no other symptoms.  Dr Christella Hartigan asked to get the pt an appt today and a CBC.  Appt with Amy for today at 130 pm and he will also come in early for labs.  Pt has been notified

## 2011-05-08 NOTE — Progress Notes (Signed)
Subjective:    Patient ID: [name], male    DOB: 04-13-1941, 70 y.o.   MRN: [id number]  HPI [name] is a 70 year old white male known previously to Dr. Arlyce Dice from colonoscopy was done in May of 2011. This showed moderate diverticulosis and a sessile polyp in the mid transverse colon which was a tubular adenoma. He did undergo a left knee replacement in August and says he has done well. He comes in today as an acute abdominal with onset of rectal bleeding early this morning. He says he felt fine yesterday and got up at about 2 AM with urge for bowel movement and passed a lot of dark red blood. He says he has no associated pain cramping diaphoresis nausea weakness dizziness etc. He went back to bed at about 6 AM had a second bloody bowel movement of about the same volume. About 8:15 this morning he had a third bowel movement grossly bloody again. He feels fine currently denies any weakness dizziness etc. His pulse was noted to be 112 1 he checked and he says he always has a rapid pulse and his baseline heart rate is in the 90s.  The patient has not had any prior rectal bleeding, he has been taking a 325 mg aspirin once daily and to Aleve each day because of his knee.    Review of Systems  Constitutional: Negative.   HENT: Negative.   Eyes: Negative.   Respiratory: Negative.   Cardiovascular: Negative.   Gastrointestinal: Positive for blood in stool.  Genitourinary: Negative.   Skin: Negative.   Neurological: Negative.   Hematological: Negative.   Psychiatric/Behavioral: Negative.    Outpatient Encounter Prescriptions as of 05/08/2011  Medication Sig Dispense Refill  . aspirin 325 MG tablet Take 325 mg by mouth daily.        Marland Kitchen esomeprazole (NEXIUM) 40 MG capsule Take 40 mg by mouth daily before breakfast. As needed       . Fenofibrate (TRICOR PO) Take by mouth 1 day or 1 dose.        Marland Kitchen LOSARTAN POTASSIUM PO Take by mouth 1 day or 1 dose.        Marland Kitchen PROBENECID PO Take by mouth 1 day or 1  dose.        . Tadalafil (CIALIS PO) Take by mouth 1 day or 1 dose. As needed         Allergies  Allergen Reactions  . Cefaclor     REACTION: skin split  . Penicillins     REACTION: rash      Objective:   Physical Exam well-developed white male in no acute distress, alert and oriented x3, pleasant. Blood pressure 142/82, pulse 112. ;Heent; nontraumatic normocephalic EOMI PERRLA sclera  Supple no JVD, Cardiovascular; regular rate and rhythm with S1 and S2 slightly tachycardia, pulmonary; clear bilaterally, Abdomen; soft, nontender nondistended, bowel sounds active, no palpable mass or hepatosplenomegaly, Rectal; dark maroonish blood on examining glove, Extremities; no clubbing cyanosis or edema, he is status post left knee replacement, Psych; mood and affect normal and appropriate.    Assessment & Plan:  #1 70 yo male with acute painless rectal bleeding most consistent with diverticular hemorrhage. He is stable, and stat hgb this am is 15.    Pt advised that hospitalization  Is warranted, however he adamantly declines hospitalization at this time due to work constraints,deadlines etc.  He is agreeable to take it easy, stay in town, stop his asa, and aleve. He will  call or go to the emergency room if he has further active bleeding thru the day. He will  come in in am tomorrow for follow up labs , and will decide course at that time.

## 2011-05-08 NOTE — Progress Notes (Signed)
I have discussed this patient with Ms. Monica Becton. She explained that it was in his best interest to be admitted to the hospital and observe but he declined. I agree with the care provided. Iva Boop, MD, Clementeen Graham

## 2011-05-09 ENCOUNTER — Telehealth: Payer: Self-pay | Admitting: *Deleted

## 2011-05-09 ENCOUNTER — Other Ambulatory Visit (INDEPENDENT_AMBULATORY_CARE_PROVIDER_SITE_OTHER): Payer: Medicare Other

## 2011-05-09 DIAGNOSIS — Z8719 Personal history of other diseases of the digestive system: Secondary | ICD-10-CM

## 2011-05-09 LAB — CBC WITH DIFFERENTIAL/PLATELET
Basophils Absolute: 0 10*3/uL (ref 0.0–0.1)
Eosinophils Relative: 2.9 % (ref 0.0–5.0)
Hemoglobin: 15.6 g/dL (ref 13.0–17.0)
Lymphocytes Relative: 28.7 % (ref 12.0–46.0)
Monocytes Relative: 6 % (ref 3.0–12.0)
Neutro Abs: 4.2 10*3/uL (ref 1.4–7.7)
RBC: 5.12 Mil/uL (ref 4.22–5.81)
RDW: 14.8 % — ABNORMAL HIGH (ref 11.5–14.6)
WBC: 6.9 10*3/uL (ref 4.5–10.5)

## 2011-05-09 NOTE — Telephone Encounter (Signed)
The patient called and asked about his labs.  Amy said his labs were completely normal.  He said he is feeling fine and his stools had very little discoloration. I told him to call us if he has any further problems.

## 2011-06-06 DIAGNOSIS — M1710 Unilateral primary osteoarthritis, unspecified knee: Secondary | ICD-10-CM | POA: Diagnosis not present

## 2011-06-06 DIAGNOSIS — IMO0002 Reserved for concepts with insufficient information to code with codable children: Secondary | ICD-10-CM | POA: Diagnosis not present

## 2011-06-17 DIAGNOSIS — N400 Enlarged prostate without lower urinary tract symptoms: Secondary | ICD-10-CM | POA: Diagnosis not present

## 2011-06-17 DIAGNOSIS — E291 Testicular hypofunction: Secondary | ICD-10-CM | POA: Diagnosis not present

## 2011-07-10 DIAGNOSIS — I10 Essential (primary) hypertension: Secondary | ICD-10-CM | POA: Diagnosis not present

## 2011-07-10 DIAGNOSIS — R05 Cough: Secondary | ICD-10-CM | POA: Diagnosis not present

## 2011-07-10 DIAGNOSIS — J320 Chronic maxillary sinusitis: Secondary | ICD-10-CM | POA: Diagnosis not present

## 2011-07-10 DIAGNOSIS — R059 Cough, unspecified: Secondary | ICD-10-CM | POA: Diagnosis not present

## 2011-07-18 DIAGNOSIS — I10 Essential (primary) hypertension: Secondary | ICD-10-CM | POA: Diagnosis not present

## 2011-07-18 DIAGNOSIS — Z85819 Personal history of malignant neoplasm of unspecified site of lip, oral cavity, and pharynx: Secondary | ICD-10-CM | POA: Diagnosis not present

## 2011-07-18 DIAGNOSIS — J310 Chronic rhinitis: Secondary | ICD-10-CM | POA: Diagnosis not present

## 2011-07-23 DIAGNOSIS — E785 Hyperlipidemia, unspecified: Secondary | ICD-10-CM | POA: Diagnosis not present

## 2011-07-23 DIAGNOSIS — I10 Essential (primary) hypertension: Secondary | ICD-10-CM | POA: Diagnosis not present

## 2011-07-23 DIAGNOSIS — M810 Age-related osteoporosis without current pathological fracture: Secondary | ICD-10-CM | POA: Diagnosis not present

## 2011-07-23 DIAGNOSIS — Z125 Encounter for screening for malignant neoplasm of prostate: Secondary | ICD-10-CM | POA: Diagnosis not present

## 2011-07-23 DIAGNOSIS — M109 Gout, unspecified: Secondary | ICD-10-CM | POA: Diagnosis not present

## 2011-07-30 DIAGNOSIS — I10 Essential (primary) hypertension: Secondary | ICD-10-CM | POA: Diagnosis not present

## 2011-07-30 DIAGNOSIS — E291 Testicular hypofunction: Secondary | ICD-10-CM | POA: Insufficient documentation

## 2011-07-30 DIAGNOSIS — E559 Vitamin D deficiency, unspecified: Secondary | ICD-10-CM | POA: Diagnosis not present

## 2011-07-30 DIAGNOSIS — Z0000 Encounter for general adult medical examination without abnormal findings: Secondary | ICD-10-CM | POA: Diagnosis not present

## 2011-08-01 DIAGNOSIS — D485 Neoplasm of uncertain behavior of skin: Secondary | ICD-10-CM | POA: Diagnosis not present

## 2011-08-01 DIAGNOSIS — Z85820 Personal history of malignant melanoma of skin: Secondary | ICD-10-CM | POA: Diagnosis not present

## 2011-08-01 DIAGNOSIS — L570 Actinic keratosis: Secondary | ICD-10-CM | POA: Diagnosis not present

## 2011-08-01 DIAGNOSIS — D235 Other benign neoplasm of skin of trunk: Secondary | ICD-10-CM | POA: Diagnosis not present

## 2011-08-06 DIAGNOSIS — H2510 Age-related nuclear cataract, unspecified eye: Secondary | ICD-10-CM | POA: Diagnosis not present

## 2011-09-23 DIAGNOSIS — L905 Scar conditions and fibrosis of skin: Secondary | ICD-10-CM | POA: Diagnosis not present

## 2011-09-23 DIAGNOSIS — L820 Inflamed seborrheic keratosis: Secondary | ICD-10-CM | POA: Diagnosis not present

## 2011-10-15 DIAGNOSIS — Z85819 Personal history of malignant neoplasm of unspecified site of lip, oral cavity, and pharynx: Secondary | ICD-10-CM | POA: Diagnosis not present

## 2011-10-15 DIAGNOSIS — J310 Chronic rhinitis: Secondary | ICD-10-CM | POA: Diagnosis not present

## 2011-10-21 DIAGNOSIS — IMO0002 Reserved for concepts with insufficient information to code with codable children: Secondary | ICD-10-CM | POA: Diagnosis not present

## 2011-10-21 DIAGNOSIS — M6740 Ganglion, unspecified site: Secondary | ICD-10-CM | POA: Diagnosis not present

## 2011-10-21 DIAGNOSIS — M1710 Unilateral primary osteoarthritis, unspecified knee: Secondary | ICD-10-CM | POA: Diagnosis not present

## 2011-11-19 DIAGNOSIS — M6740 Ganglion, unspecified site: Secondary | ICD-10-CM | POA: Diagnosis not present

## 2011-12-09 DIAGNOSIS — M6740 Ganglion, unspecified site: Secondary | ICD-10-CM | POA: Diagnosis not present

## 2011-12-16 DIAGNOSIS — N400 Enlarged prostate without lower urinary tract symptoms: Secondary | ICD-10-CM | POA: Diagnosis not present

## 2011-12-16 DIAGNOSIS — N138 Other obstructive and reflux uropathy: Secondary | ICD-10-CM | POA: Diagnosis not present

## 2011-12-16 DIAGNOSIS — N401 Enlarged prostate with lower urinary tract symptoms: Secondary | ICD-10-CM | POA: Diagnosis not present

## 2011-12-16 DIAGNOSIS — E291 Testicular hypofunction: Secondary | ICD-10-CM | POA: Diagnosis not present

## 2011-12-24 DIAGNOSIS — J310 Chronic rhinitis: Secondary | ICD-10-CM | POA: Diagnosis not present

## 2011-12-24 DIAGNOSIS — K117 Disturbances of salivary secretion: Secondary | ICD-10-CM | POA: Diagnosis not present

## 2012-01-21 DIAGNOSIS — L570 Actinic keratosis: Secondary | ICD-10-CM | POA: Diagnosis not present

## 2012-01-21 DIAGNOSIS — L905 Scar conditions and fibrosis of skin: Secondary | ICD-10-CM | POA: Diagnosis not present

## 2012-01-21 DIAGNOSIS — D235 Other benign neoplasm of skin of trunk: Secondary | ICD-10-CM | POA: Diagnosis not present

## 2012-01-21 DIAGNOSIS — Z85820 Personal history of malignant melanoma of skin: Secondary | ICD-10-CM | POA: Diagnosis not present

## 2012-02-04 DIAGNOSIS — Z23 Encounter for immunization: Secondary | ICD-10-CM | POA: Diagnosis not present

## 2012-02-04 DIAGNOSIS — C069 Malignant neoplasm of mouth, unspecified: Secondary | ICD-10-CM | POA: Diagnosis not present

## 2012-02-04 DIAGNOSIS — G4733 Obstructive sleep apnea (adult) (pediatric): Secondary | ICD-10-CM | POA: Diagnosis not present

## 2012-02-04 DIAGNOSIS — I10 Essential (primary) hypertension: Secondary | ICD-10-CM | POA: Diagnosis not present

## 2012-04-08 DIAGNOSIS — G4733 Obstructive sleep apnea (adult) (pediatric): Secondary | ICD-10-CM | POA: Diagnosis not present

## 2012-04-08 DIAGNOSIS — Z85819 Personal history of malignant neoplasm of unspecified site of lip, oral cavity, and pharynx: Secondary | ICD-10-CM | POA: Diagnosis not present

## 2012-04-08 DIAGNOSIS — J310 Chronic rhinitis: Secondary | ICD-10-CM | POA: Diagnosis not present

## 2012-06-12 DIAGNOSIS — E291 Testicular hypofunction: Secondary | ICD-10-CM | POA: Diagnosis not present

## 2012-06-22 DIAGNOSIS — N400 Enlarged prostate without lower urinary tract symptoms: Secondary | ICD-10-CM | POA: Diagnosis not present

## 2012-06-22 DIAGNOSIS — E291 Testicular hypofunction: Secondary | ICD-10-CM | POA: Diagnosis not present

## 2012-07-07 DIAGNOSIS — M1710 Unilateral primary osteoarthritis, unspecified knee: Secondary | ICD-10-CM | POA: Diagnosis not present

## 2012-07-07 DIAGNOSIS — IMO0002 Reserved for concepts with insufficient information to code with codable children: Secondary | ICD-10-CM | POA: Diagnosis not present

## 2012-07-27 DIAGNOSIS — Z125 Encounter for screening for malignant neoplasm of prostate: Secondary | ICD-10-CM | POA: Diagnosis not present

## 2012-07-27 DIAGNOSIS — E785 Hyperlipidemia, unspecified: Secondary | ICD-10-CM | POA: Diagnosis not present

## 2012-07-27 DIAGNOSIS — M109 Gout, unspecified: Secondary | ICD-10-CM | POA: Diagnosis not present

## 2012-07-27 DIAGNOSIS — E559 Vitamin D deficiency, unspecified: Secondary | ICD-10-CM | POA: Diagnosis not present

## 2012-07-27 DIAGNOSIS — E291 Testicular hypofunction: Secondary | ICD-10-CM | POA: Diagnosis not present

## 2012-08-03 DIAGNOSIS — C069 Malignant neoplasm of mouth, unspecified: Secondary | ICD-10-CM | POA: Diagnosis not present

## 2012-08-03 DIAGNOSIS — M949 Disorder of cartilage, unspecified: Secondary | ICD-10-CM | POA: Diagnosis not present

## 2012-08-03 DIAGNOSIS — J309 Allergic rhinitis, unspecified: Secondary | ICD-10-CM | POA: Diagnosis not present

## 2012-08-03 DIAGNOSIS — E785 Hyperlipidemia, unspecified: Secondary | ICD-10-CM | POA: Diagnosis not present

## 2012-08-03 DIAGNOSIS — N200 Calculus of kidney: Secondary | ICD-10-CM | POA: Diagnosis not present

## 2012-08-03 DIAGNOSIS — Z0000 Encounter for general adult medical examination without abnormal findings: Secondary | ICD-10-CM | POA: Diagnosis not present

## 2012-08-03 DIAGNOSIS — M899 Disorder of bone, unspecified: Secondary | ICD-10-CM | POA: Diagnosis not present

## 2012-08-03 DIAGNOSIS — Z1212 Encounter for screening for malignant neoplasm of rectum: Secondary | ICD-10-CM | POA: Diagnosis not present

## 2012-08-03 DIAGNOSIS — I10 Essential (primary) hypertension: Secondary | ICD-10-CM | POA: Diagnosis not present

## 2012-08-03 DIAGNOSIS — E291 Testicular hypofunction: Secondary | ICD-10-CM | POA: Diagnosis not present

## 2012-08-03 DIAGNOSIS — M109 Gout, unspecified: Secondary | ICD-10-CM | POA: Diagnosis not present

## 2012-08-04 DIAGNOSIS — H2510 Age-related nuclear cataract, unspecified eye: Secondary | ICD-10-CM | POA: Diagnosis not present

## 2012-08-04 DIAGNOSIS — D485 Neoplasm of uncertain behavior of skin: Secondary | ICD-10-CM | POA: Diagnosis not present

## 2012-08-04 DIAGNOSIS — Z85820 Personal history of malignant melanoma of skin: Secondary | ICD-10-CM | POA: Diagnosis not present

## 2012-08-04 DIAGNOSIS — D235 Other benign neoplasm of skin of trunk: Secondary | ICD-10-CM | POA: Diagnosis not present

## 2012-09-01 DIAGNOSIS — M1710 Unilateral primary osteoarthritis, unspecified knee: Secondary | ICD-10-CM | POA: Diagnosis not present

## 2012-09-01 DIAGNOSIS — IMO0002 Reserved for concepts with insufficient information to code with codable children: Secondary | ICD-10-CM | POA: Diagnosis not present

## 2012-09-01 DIAGNOSIS — M79609 Pain in unspecified limb: Secondary | ICD-10-CM | POA: Diagnosis not present

## 2012-09-22 DIAGNOSIS — Z85819 Personal history of malignant neoplasm of unspecified site of lip, oral cavity, and pharynx: Secondary | ICD-10-CM | POA: Diagnosis not present

## 2012-10-19 DIAGNOSIS — D485 Neoplasm of uncertain behavior of skin: Secondary | ICD-10-CM | POA: Diagnosis not present

## 2012-10-19 DIAGNOSIS — D235 Other benign neoplasm of skin of trunk: Secondary | ICD-10-CM | POA: Diagnosis not present

## 2012-10-19 DIAGNOSIS — Z85820 Personal history of malignant melanoma of skin: Secondary | ICD-10-CM | POA: Diagnosis not present

## 2012-12-29 DIAGNOSIS — Z85819 Personal history of malignant neoplasm of unspecified site of lip, oral cavity, and pharynx: Secondary | ICD-10-CM | POA: Diagnosis not present

## 2012-12-29 DIAGNOSIS — G4733 Obstructive sleep apnea (adult) (pediatric): Secondary | ICD-10-CM | POA: Diagnosis not present

## 2012-12-29 DIAGNOSIS — K117 Disturbances of salivary secretion: Secondary | ICD-10-CM | POA: Diagnosis not present

## 2012-12-29 DIAGNOSIS — J310 Chronic rhinitis: Secondary | ICD-10-CM | POA: Diagnosis not present

## 2013-02-01 DIAGNOSIS — M109 Gout, unspecified: Secondary | ICD-10-CM | POA: Diagnosis not present

## 2013-02-01 DIAGNOSIS — Z1331 Encounter for screening for depression: Secondary | ICD-10-CM | POA: Diagnosis not present

## 2013-02-01 DIAGNOSIS — E291 Testicular hypofunction: Secondary | ICD-10-CM | POA: Diagnosis not present

## 2013-02-01 DIAGNOSIS — F17200 Nicotine dependence, unspecified, uncomplicated: Secondary | ICD-10-CM | POA: Diagnosis not present

## 2013-02-01 DIAGNOSIS — C069 Malignant neoplasm of mouth, unspecified: Secondary | ICD-10-CM | POA: Diagnosis not present

## 2013-02-01 DIAGNOSIS — Z85820 Personal history of malignant melanoma of skin: Secondary | ICD-10-CM | POA: Diagnosis not present

## 2013-02-01 DIAGNOSIS — I10 Essential (primary) hypertension: Secondary | ICD-10-CM | POA: Diagnosis not present

## 2013-02-01 DIAGNOSIS — Z23 Encounter for immunization: Secondary | ICD-10-CM | POA: Diagnosis not present

## 2013-02-01 DIAGNOSIS — E785 Hyperlipidemia, unspecified: Secondary | ICD-10-CM | POA: Diagnosis not present

## 2013-02-01 DIAGNOSIS — E559 Vitamin D deficiency, unspecified: Secondary | ICD-10-CM | POA: Diagnosis not present

## 2013-02-02 DIAGNOSIS — L819 Disorder of pigmentation, unspecified: Secondary | ICD-10-CM | POA: Diagnosis not present

## 2013-02-02 DIAGNOSIS — L570 Actinic keratosis: Secondary | ICD-10-CM | POA: Diagnosis not present

## 2013-02-02 DIAGNOSIS — Z85820 Personal history of malignant melanoma of skin: Secondary | ICD-10-CM | POA: Diagnosis not present

## 2013-03-17 DIAGNOSIS — M25569 Pain in unspecified knee: Secondary | ICD-10-CM | POA: Diagnosis not present

## 2013-04-02 DIAGNOSIS — J310 Chronic rhinitis: Secondary | ICD-10-CM | POA: Diagnosis not present

## 2013-04-02 DIAGNOSIS — Z85819 Personal history of malignant neoplasm of unspecified site of lip, oral cavity, and pharynx: Secondary | ICD-10-CM | POA: Diagnosis not present

## 2013-04-19 DIAGNOSIS — L570 Actinic keratosis: Secondary | ICD-10-CM | POA: Diagnosis not present

## 2013-04-19 DIAGNOSIS — Z85828 Personal history of other malignant neoplasm of skin: Secondary | ICD-10-CM | POA: Diagnosis not present

## 2013-04-19 DIAGNOSIS — Z85820 Personal history of malignant melanoma of skin: Secondary | ICD-10-CM | POA: Diagnosis not present

## 2013-04-19 DIAGNOSIS — L820 Inflamed seborrheic keratosis: Secondary | ICD-10-CM | POA: Diagnosis not present

## 2013-06-16 DIAGNOSIS — N400 Enlarged prostate without lower urinary tract symptoms: Secondary | ICD-10-CM | POA: Diagnosis not present

## 2013-06-16 DIAGNOSIS — E291 Testicular hypofunction: Secondary | ICD-10-CM | POA: Diagnosis not present

## 2013-06-22 DIAGNOSIS — K117 Disturbances of salivary secretion: Secondary | ICD-10-CM | POA: Diagnosis not present

## 2013-06-22 DIAGNOSIS — J310 Chronic rhinitis: Secondary | ICD-10-CM | POA: Diagnosis not present

## 2013-06-22 DIAGNOSIS — Z85819 Personal history of malignant neoplasm of unspecified site of lip, oral cavity, and pharynx: Secondary | ICD-10-CM | POA: Diagnosis not present

## 2013-06-22 DIAGNOSIS — G4733 Obstructive sleep apnea (adult) (pediatric): Secondary | ICD-10-CM | POA: Diagnosis not present

## 2013-06-28 DIAGNOSIS — D485 Neoplasm of uncertain behavior of skin: Secondary | ICD-10-CM | POA: Diagnosis not present

## 2013-06-28 DIAGNOSIS — L820 Inflamed seborrheic keratosis: Secondary | ICD-10-CM | POA: Diagnosis not present

## 2013-07-12 DIAGNOSIS — N139 Obstructive and reflux uropathy, unspecified: Secondary | ICD-10-CM | POA: Diagnosis not present

## 2013-07-12 DIAGNOSIS — E291 Testicular hypofunction: Secondary | ICD-10-CM | POA: Diagnosis not present

## 2013-07-12 DIAGNOSIS — N138 Other obstructive and reflux uropathy: Secondary | ICD-10-CM | POA: Diagnosis not present

## 2013-07-12 DIAGNOSIS — N401 Enlarged prostate with lower urinary tract symptoms: Secondary | ICD-10-CM | POA: Diagnosis not present

## 2013-07-20 DIAGNOSIS — K130 Diseases of lips: Secondary | ICD-10-CM | POA: Diagnosis not present

## 2013-07-20 DIAGNOSIS — M25569 Pain in unspecified knee: Secondary | ICD-10-CM | POA: Diagnosis not present

## 2013-07-20 DIAGNOSIS — M25519 Pain in unspecified shoulder: Secondary | ICD-10-CM | POA: Diagnosis not present

## 2013-07-29 DIAGNOSIS — M25569 Pain in unspecified knee: Secondary | ICD-10-CM | POA: Diagnosis not present

## 2013-08-02 DIAGNOSIS — M109 Gout, unspecified: Secondary | ICD-10-CM | POA: Diagnosis not present

## 2013-08-02 DIAGNOSIS — R609 Edema, unspecified: Secondary | ICD-10-CM | POA: Diagnosis not present

## 2013-08-02 DIAGNOSIS — M899 Disorder of bone, unspecified: Secondary | ICD-10-CM | POA: Diagnosis not present

## 2013-08-02 DIAGNOSIS — Z125 Encounter for screening for malignant neoplasm of prostate: Secondary | ICD-10-CM | POA: Diagnosis not present

## 2013-08-02 DIAGNOSIS — M949 Disorder of cartilage, unspecified: Secondary | ICD-10-CM | POA: Diagnosis not present

## 2013-08-02 DIAGNOSIS — I10 Essential (primary) hypertension: Secondary | ICD-10-CM | POA: Diagnosis not present

## 2013-08-02 DIAGNOSIS — E559 Vitamin D deficiency, unspecified: Secondary | ICD-10-CM | POA: Diagnosis not present

## 2013-08-02 DIAGNOSIS — E785 Hyperlipidemia, unspecified: Secondary | ICD-10-CM | POA: Diagnosis not present

## 2013-08-02 DIAGNOSIS — E291 Testicular hypofunction: Secondary | ICD-10-CM | POA: Diagnosis not present

## 2013-08-05 DIAGNOSIS — I6529 Occlusion and stenosis of unspecified carotid artery: Secondary | ICD-10-CM | POA: Diagnosis not present

## 2013-08-05 DIAGNOSIS — I10 Essential (primary) hypertension: Secondary | ICD-10-CM | POA: Diagnosis not present

## 2013-08-05 DIAGNOSIS — M810 Age-related osteoporosis without current pathological fracture: Secondary | ICD-10-CM | POA: Diagnosis not present

## 2013-08-05 DIAGNOSIS — Z0000 Encounter for general adult medical examination without abnormal findings: Secondary | ICD-10-CM | POA: Diagnosis not present

## 2013-08-05 DIAGNOSIS — N138 Other obstructive and reflux uropathy: Secondary | ICD-10-CM | POA: Diagnosis not present

## 2013-08-05 DIAGNOSIS — E291 Testicular hypofunction: Secondary | ICD-10-CM | POA: Diagnosis not present

## 2013-08-05 DIAGNOSIS — C069 Malignant neoplasm of mouth, unspecified: Secondary | ICD-10-CM | POA: Diagnosis not present

## 2013-08-05 DIAGNOSIS — N401 Enlarged prostate with lower urinary tract symptoms: Secondary | ICD-10-CM | POA: Diagnosis not present

## 2013-08-05 DIAGNOSIS — Z1212 Encounter for screening for malignant neoplasm of rectum: Secondary | ICD-10-CM | POA: Diagnosis not present

## 2013-08-05 DIAGNOSIS — E559 Vitamin D deficiency, unspecified: Secondary | ICD-10-CM | POA: Diagnosis not present

## 2013-08-06 ENCOUNTER — Other Ambulatory Visit (HOSPITAL_COMMUNITY): Payer: Self-pay | Admitting: Internal Medicine

## 2013-08-06 ENCOUNTER — Ambulatory Visit (HOSPITAL_COMMUNITY)
Admission: RE | Admit: 2013-08-06 | Discharge: 2013-08-06 | Disposition: A | Discharge status: Home / Self Care | Payer: Medicare Other | Source: Ambulatory Visit | Attending: Vascular Surgery | Admitting: Vascular Surgery

## 2013-08-06 DIAGNOSIS — I739 Peripheral vascular disease, unspecified: Secondary | ICD-10-CM

## 2013-08-06 DIAGNOSIS — I779 Disorder of arteries and arterioles, unspecified: Secondary | ICD-10-CM

## 2013-08-10 DIAGNOSIS — I10 Essential (primary) hypertension: Secondary | ICD-10-CM | POA: Diagnosis not present

## 2013-08-10 DIAGNOSIS — M545 Low back pain, unspecified: Secondary | ICD-10-CM | POA: Diagnosis not present

## 2013-08-10 DIAGNOSIS — IMO0002 Reserved for concepts with insufficient information to code with codable children: Secondary | ICD-10-CM | POA: Diagnosis not present

## 2013-08-10 DIAGNOSIS — M1710 Unilateral primary osteoarthritis, unspecified knee: Secondary | ICD-10-CM | POA: Diagnosis not present

## 2013-08-10 DIAGNOSIS — M4310 Spondylolisthesis, site unspecified: Secondary | ICD-10-CM | POA: Diagnosis not present

## 2013-08-10 DIAGNOSIS — Z87891 Personal history of nicotine dependence: Secondary | ICD-10-CM | POA: Diagnosis not present

## 2013-08-11 DIAGNOSIS — H2510 Age-related nuclear cataract, unspecified eye: Secondary | ICD-10-CM | POA: Diagnosis not present

## 2013-08-24 DIAGNOSIS — N138 Other obstructive and reflux uropathy: Secondary | ICD-10-CM | POA: Diagnosis not present

## 2013-08-24 DIAGNOSIS — E291 Testicular hypofunction: Secondary | ICD-10-CM | POA: Diagnosis not present

## 2013-08-24 DIAGNOSIS — N139 Obstructive and reflux uropathy, unspecified: Secondary | ICD-10-CM | POA: Diagnosis not present

## 2013-08-24 DIAGNOSIS — N401 Enlarged prostate with lower urinary tract symptoms: Secondary | ICD-10-CM | POA: Diagnosis not present

## 2013-09-13 ENCOUNTER — Encounter: Payer: Self-pay | Admitting: Vascular Surgery

## 2013-09-14 ENCOUNTER — Ambulatory Visit (INDEPENDENT_AMBULATORY_CARE_PROVIDER_SITE_OTHER): Payer: Medicare Other | Admitting: Vascular Surgery

## 2013-09-14 ENCOUNTER — Encounter: Payer: Self-pay | Admitting: Vascular Surgery

## 2013-09-14 VITALS — BP 111/75 | HR 99 | Ht 70.0 in | Wt 224.0 lb

## 2013-09-14 DIAGNOSIS — I6529 Occlusion and stenosis of unspecified carotid artery: Secondary | ICD-10-CM | POA: Diagnosis not present

## 2013-09-14 NOTE — Progress Notes (Signed)
Patient name: [name] MRN: [id number] DOB: 1940-07-22 Sex: male   Referred by: Virgina Jock  Reason for referral:  Chief Complaint  Patient presents with  . New Evaluation    carotid stenosis     HISTORY OF PRESENT ILLNESS: Patient is a very pleasant 73 year old gentleman who is been very compliant for 10 years of having lifeline screening. His most recent study showed a marked progression in stenosis in his right internal carotid artery. He's all our vascular lab on 08/06/2013 for repeat evaluation this does show proximal 80% stenosis of his right internal carotid artery. No significant stenosis in his left carotid system. He is right-handed. He specifically denies any prior episodes of amaurosis fugax, transient ischemic attack or stroke. He is a lifelong cigar smoker. He does have a history of hyperlipidemia and elevated blood pressure which is controlled with medication. He denies any prior cardiac difficulties.  Past Medical History  Diagnosis Date  . Hyperlipemia   . GERD (gastroesophageal reflux disease)   . Gout   . Hypertension   . Colon polyps     Past Surgical History  Procedure Laterality Date  . Replacement total knee Left   . 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 Marital Status: Divorced    Spouse Name: N/A    Number of Children: N/A  . Years of Education: N/A   Occupational History  . 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 Smoking status: Current Every Day Smoker    Types: Cigars  . Smokeless tobacco: Never Used     Comment: pt states he smokes a couple of cigars per day  . Alcohol Use: 12.6 oz/week    7 Glasses of wine, 14 Shots of liquor per week  . Drug Use: No  . Sexual Activity: Not on file   Other Topics Concern  . Not on file   Social History Narrative  . No narrative on file    Family History  Problem Relation Age of Onset   . Diverticulosis Mother   . Cancer Mother   . Varicose Veins Mother   . Hyperlipidemia Father   . Heart disease Father   . Heart attack Father     Allergies as of 09/14/2013 - Review Complete 09/14/2013  Allergen Reaction Noted  . Cefaclor  09/20/2009  . Penicillins  09/20/2009    Current Outpatient Prescriptions on File Prior to Visit  Medication Sig Dispense Refill  . aspirin 325 MG tablet Take 325 mg by mouth daily.        Marland Kitchen esomeprazole (NEXIUM) 40 MG capsule Take 40 mg by mouth as needed. As needed      . Fenofibrate (TRICOR PO) Take 145 mg by mouth daily.       Marland Kitchen LOSARTAN POTASSIUM PO Take 1 tablet by mouth daily.       Marland Kitchen PROBENECID PO Take 500 mg by mouth daily.       . Tadalafil (CIALIS PO) Take 5 mg by mouth as needed. As needed       No current facility-administered medications on file prior to visit.     REVIEW OF SYSTEMS:  Positives indicated with an "X"  CARDIOVASCULAR:  [ ]  chest pain   [ ]  chest pressure   [ ]  palpitations   [ ]  orthopnea   [ ]  dyspnea  on exertion   [ ]  claudication   [ ]  rest pain   [ ]  DVT   [ ]  phlebitis PULMONARY:   [ ]  productive cough   [ ]  asthma   [ ]  wheezing NEUROLOGIC:   [ ]  weakness  [ ]  paresthesias  [ ]  aphasia  [ ]  amaurosis  [ ]  dizziness HEMATOLOGIC:   [ ]  bleeding problems   [ ]  clotting disorders MUSCULOSKELETAL:  [ ]  joint pain   [ ]  joint swelling GASTROINTESTINAL: [ ]   blood in stool  [ ]   hematemesis GENITOURINARY:  [ ]   dysuria  [ ]   hematuria PSYCHIATRIC:  [ ]  history of major depression INTEGUMENTARY:  [ ]  rashes  [ ]  ulcers CONSTITUTIONAL:  [ ]  fever   [ ]  chills  PHYSICAL EXAMINATION:  General: The patient is a well-nourished male, in no acute distress. Vital signs are BP 111/75  Pulse 99  Ht 5\' 10"  (1.778 m)  Wt 224 lb (101.606 kg)  BMI 32.14 kg/m2  SpO2 97% Pulmonary: There is a good air exchange bilaterally without wheezing or rales. Abdomen: Soft and non-tender with normal pitch bowel  sounds. Musculoskeletal: There are no major deformities.  There is no significant extremity pain. Neurologic: No focal weakness or paresthesias are detected, Skin: There are no ulcer or rashes noted. Psychiatric: The patient has normal affect. Cardiovascular: There is a regular rate and rhythm without significant murmur appreciated. Carotid arteries without bruits bilaterally Pulse status 2+ radial 2+ femoral and 2 posterior cells pedis pulses bilaterally  [facility] Vascular Lab Studies:  Markedly elevated velocities right carotid system with velocity in the systolic of 932 and diastolic 99 cm per sec  Impression and Plan:  High-grade right internal carotid artery stenosis. I discussed the significance of this great length with the patient. I did explain that he is at the threshold of repair. I explained that we could repeat the study in 6 months but certainly feel comfortable with proceeding with surgery with his stenosis at the 80% range. He does not want to continue to watch this off of this to the appropriate he wished to proceed with elective repair. I would agree with his young age in excellent health. I did explain the procedure and one night hospitalization typically. Also explained the 1.5 risk of stroke with surgery and also the very slight risk for cranial nerve injury bleeding or infection. He did have impacted salivary gland excised on the right side several years ago and I do not feel that this will make any difference regarding his surgery. He will check his schedule. 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

## 2013-09-16 ENCOUNTER — Other Ambulatory Visit: Payer: Self-pay

## 2013-09-21 DIAGNOSIS — Z85819 Personal history of malignant neoplasm of unspecified site of lip, oral cavity, and pharynx: Secondary | ICD-10-CM | POA: Diagnosis not present

## 2013-09-21 DIAGNOSIS — K117 Disturbances of salivary secretion: Secondary | ICD-10-CM | POA: Diagnosis not present

## 2013-09-23 ENCOUNTER — Encounter (HOSPITAL_COMMUNITY)
Admission: RE | Admit: 2013-09-23 | Discharge: 2013-09-23 | Disposition: A | Discharge status: Home / Self Care | Payer: Medicare Other | Source: Ambulatory Visit | Attending: Vascular Surgery | Admitting: Vascular Surgery

## 2013-09-23 ENCOUNTER — Encounter (HOSPITAL_COMMUNITY): Payer: Self-pay

## 2013-09-23 DIAGNOSIS — Z01810 Encounter for preprocedural cardiovascular examination: Secondary | ICD-10-CM | POA: Diagnosis not present

## 2013-09-23 DIAGNOSIS — Z01812 Encounter for preprocedural laboratory examination: Secondary | ICD-10-CM | POA: Diagnosis not present

## 2013-09-23 HISTORY — DX: Sleep apnea, unspecified: G47.30

## 2013-09-23 HISTORY — DX: Benign prostatic hyperplasia without lower urinary tract symptoms: N40.0

## 2013-09-23 HISTORY — DX: Unspecified osteoarthritis, unspecified site: M19.90

## 2013-09-23 LAB — CBC
HCT: 46.4 % (ref 39.0–52.0)
Hemoglobin: 15.3 g/dL (ref 13.0–17.0)
MCH: 31.1 pg (ref 26.0–34.0)
MCHC: 33 g/dL (ref 30.0–36.0)
MCV: 94.3 fL (ref 78.0–100.0)
PLATELETS: 171 10*3/uL (ref 150–400)
RBC: 4.92 MIL/uL (ref 4.22–5.81)
RDW: 13.9 % (ref 11.5–15.5)
WBC: 6.6 10*3/uL (ref 4.0–10.5)

## 2013-09-23 LAB — URINALYSIS, ROUTINE W REFLEX MICROSCOPIC
BILIRUBIN URINE: NEGATIVE
Glucose, UA: NEGATIVE mg/dL
Hgb urine dipstick: NEGATIVE
Ketones, ur: NEGATIVE mg/dL
Leukocytes, UA: NEGATIVE
Nitrite: NEGATIVE
PROTEIN: NEGATIVE mg/dL
SPECIFIC GRAVITY, URINE: 1.024 (ref 1.005–1.030)
UROBILINOGEN UA: 1 mg/dL (ref 0.0–1.0)
pH: 6.5 (ref 5.0–8.0)

## 2013-09-23 LAB — COMPREHENSIVE METABOLIC PANEL
ALK PHOS: 35 U/L — AB (ref 39–117)
ALT: 22 U/L (ref 0–53)
AST: 17 U/L (ref 0–37)
Albumin: 3.8 g/dL (ref 3.5–5.2)
BUN: 24 mg/dL — ABNORMAL HIGH (ref 6–23)
CO2: 26 mEq/L (ref 19–32)
Calcium: 9.2 mg/dL (ref 8.4–10.5)
Chloride: 103 mEq/L (ref 96–112)
Creatinine, Ser: 1.25 mg/dL (ref 0.50–1.35)
GFR calc Af Amer: 65 mL/min — ABNORMAL LOW (ref >90)
GFR calc non Af Amer: 56 mL/min — ABNORMAL LOW (ref >90)
Glucose, Bld: 120 mg/dL — ABNORMAL HIGH (ref 70–99)
POTASSIUM: 3.5 meq/L — AB (ref 3.7–5.3)
SODIUM: 143 meq/L (ref 137–147)
Total Bilirubin: 0.3 mg/dL (ref 0.3–1.2)
Total Protein: 6.6 g/dL (ref 6.0–8.3)

## 2013-09-23 LAB — TYPE AND SCREEN
ABO/RH(D): AB POS
Antibody Screen: NEGATIVE

## 2013-09-23 LAB — PROTIME-INR
INR: 1.21 (ref 0.00–1.49)
Prothrombin Time: 15 seconds (ref 11.6–15.2)

## 2013-09-23 LAB — SURGICAL PCR SCREEN
MRSA, PCR: NEGATIVE
STAPHYLOCOCCUS AUREUS: NEGATIVE

## 2013-09-23 LAB — APTT: aPTT: 27 seconds (ref 24–37)

## 2013-09-23 MED ORDER — CHLORHEXIDINE GLUCONATE 4 % EX LIQD: 60.0000 mL | Freq: Once | CUTANEOUS | Status: DC

## 2013-09-23 NOTE — Pre-Procedure Instructions (Signed)
[name]  09/23/2013   Your procedure is scheduled on:  10-01-2013  Friday   Report to [facility] Admitting at 5:30 AM .  Call this number if you have problems the morning of surgery: [phone]   Remember:   Do not eat food or drink liquids after midnight.    Take these medicines the morning of surgery with A SIP OF WATER: nexium,   Do not wear jewelry,  Do not wear lotions, powders, or perfumes. You may wear deodorant.  Do not shave 48 hours prior to surgery. Men may shave face and neck.  Do not bring valuables to the hospital.  [facility] is not responsible  for any belongings or valuables.               Contacts, dentures or bridgework may not be worn into surgery.   Leave suitcase in the car. After surgery it may be brought to your room.  For patients admitted to the hospital, discharge time is determined by your treatment team.               Patients discharged the day of surgery will not be allowed to drive home.    Special Instructions: SEE ATTACHED INSTRUCTIONS FOR CHG SHOWER/BATH   Please read over the following fact sheets that you were given: Pain Booklet, Blood Transfusion Information and Surgical Site Infection Prevention

## 2013-09-23 NOTE — Progress Notes (Signed)
Requested CXR from Dr. Shon Baton .

## 2013-09-28 ENCOUNTER — Encounter: Payer: Self-pay | Admitting: Gastroenterology

## 2013-09-28 ENCOUNTER — Ambulatory Visit (INDEPENDENT_AMBULATORY_CARE_PROVIDER_SITE_OTHER): Payer: Medicare Other | Admitting: Gastroenterology

## 2013-09-28 VITALS — BP 90/60 | HR 104 | Ht 67.32 in | Wt 222.0 lb

## 2013-09-28 DIAGNOSIS — K5730 Diverticulosis of large intestine without perforation or abscess without bleeding: Secondary | ICD-10-CM | POA: Diagnosis not present

## 2013-09-28 DIAGNOSIS — R195 Other fecal abnormalities: Secondary | ICD-10-CM | POA: Diagnosis not present

## 2013-09-28 DIAGNOSIS — Z86010 Personal history of colonic polyps: Secondary | ICD-10-CM

## 2013-09-28 DIAGNOSIS — K5790 Diverticulosis of intestine, part unspecified, without perforation or abscess without bleeding: Secondary | ICD-10-CM

## 2013-09-28 NOTE — Patient Instructions (Addendum)
You have been given a separate informational sheet regarding your tobacco use, the importance of quitting and local resources to help you quit. You have been scheduled for an endoscopy with propofol. Please follow written instructions given to you at your visit today. If you use inhalers (even only as needed), please bring them with you on the day of your procedure. Your physician has requested that you go to www.startemmi.com and enter the access code given to you at your visit today. This web site gives a general overview about your procedure. However, you should still follow specific instructions given to you by our office regarding your preparation for the procedure.

## 2013-09-28 NOTE — Assessment & Plan Note (Signed)
Occult blood may be related to NSAID use.  Peptic ulcer disease and he wrote his or ulcerations along the GI tract are possibilities.  Colonic bleeding source is less likely.  Recommendations #1 upper endoscopy

## 2013-09-28 NOTE — Progress Notes (Signed)
_                                                                                                                History of Present Illness:  Pleasant 73 year old white male referred for Hemoccult-positive stool.  This was noted on routine testing.  The patient has no GI complaints including abdominal pain, change in bowel habits, melena or hematochezia.  He takes Aleve at least twice daily for arthritic pain.  He takes Nexium when necessary for pyrosis.  An adenomatous polyp was removed in 2011.  Diverticulosis was also seen.  He had an acute lower GI bleed in 2012 (patient refused hospitalization) that was felt due to  a diverticular bleed.  Recent hemoglobin was 15.3.    Past Medical History  Diagnosis Date  . GERD (gastroesophageal reflux disease)   . Gout     pt denies 10/01/2013  . Hypertension   . Colon polyps   . Sleep apnea     had suegery in 2000 for sleep apnea  . Prostate hypertrophy   . Arthritis   . Skin cancer    Past Surgical History  Procedure Laterality Date  . Replacement total knee Left   . 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 Removal of melonomia    . Sleep apnea surgery    . Rhinoplasty     family history includes Diverticulosis in his mother; Heart attack in his father; Heart disease in his father; Hyperlipidemia in his father; Skin cancer in his mother; Varicose Veins in his mother. Current Outpatient Prescriptions  Medication Sig Dispense Refill  . aspirin 325 MG tablet Take 325 mg by mouth at bedtime.       . Cholecalciferol (VITAMIN D PO) Take 6,000 Units by mouth daily.      Marland Kitchen esomeprazole (NEXIUM) 40 MG capsule Take 40 mg by mouth daily as needed (acid reflux).       . fenofibrate (TRICOR) 145 MG tablet Take 145 mg by mouth daily.      Marland Kitchen losartan-hydrochlorothiazide (HYZAAR) 100-25 MG per tablet Take 1 tablet by mouth daily.      . naproxen sodium (ALEVE) 220 MG  tablet Take 440 mg by mouth daily.      . Omega-3 Fatty Acids (FISH OIL) 1200 MG CAPS Take 4,800 mg by mouth at bedtime.      . probenecid (BENEMID) 500 MG tablet Take 500 mg by mouth daily.      . rosuvastatin (CRESTOR) 10 MG tablet Take 10 mg by mouth daily.      . tadalafil (CIALIS) 5 MG tablet Take 5 mg by mouth daily.      . Testosterone (AXIRON) 30 MG/ACT SOLN Place 180 mg onto the skin See admin instructions. Apply 3 pumps (90 mg) in each armpit every morning      . clindamycin (CLEOCIN) 150 MG capsule Take 600 mg by mouth See admin instructions. Take 4 capsules  by mouth 1 hour prior to dental appointment       No current facility-administered medications for this visit.   Allergies as of 09/28/2013 - Review Complete 09/28/2013  Allergen Reaction Noted  . Cefaclor Other (See Comments) 09/20/2009  . Penicillins Rash 09/20/2009    reports that he has been smoking Cigars.  He has never used smokeless tobacco. He reports that he drinks about 12.6 ounces of alcohol per week. He reports that he does not use illicit drugs.     Review of Systems: He complains of arthritic pain in his hands Pertinent positive and negative review of systems were noted in the above HPI section. All other review of systems were otherwise negative.  Vital signs were reviewed in today's medical record Physical Exam: General: Well developed , well nourished, no acute distress Skin: anicteric Head: Normocephalic and atraumatic Eyes:  sclerae anicteric, EOMI Ears: Normal auditory acuity Mouth: No deformity or lesions Neck: Supple, no masses or thyromegaly Lungs: Clear throughout to auscultation Heart: Regular rate and rhythm; no murmurs, rubs or bruits Abdomen: Soft, non tender and non distended. No masses, hepatosplenomegaly or hernias noted. Normal Bowel sounds Rectal:deferred Musculoskeletal: Symmetrical with no gross deformities  Skin: No lesions on visible extremities Pulses:  Normal pulses  noted Extremities: No clubbing, cyanosis, edema or deformities noted Neurological: Alert oriented x 4, grossly nonfocal Cervical Nodes:  No significant cervical adenopathy Inguinal Nodes: No significant inguinal adenopathy Psychological:  Alert and cooperative. Normal mood and affect  See Assessment and Plan under Problem List

## 2013-09-28 NOTE — Assessment & Plan Note (Signed)
Followup colonoscopy 2016 

## 2013-09-30 MED ORDER — VANCOMYCIN HCL 10 G IV SOLR
1500.0000 mg | INTRAVENOUS | Status: AC
  Administered 2013-10-01: 1500 mg via INTRAVENOUS
  Filled 2013-09-30: qty 1500

## 2013-09-30 MED ORDER — SODIUM CHLORIDE 0.9 % IV SOLN: INTRAVENOUS | Status: DC

## 2013-10-01 ENCOUNTER — Encounter (HOSPITAL_COMMUNITY): Payer: Medicare Other | Admitting: Certified Registered Nurse Anesthetist

## 2013-10-01 ENCOUNTER — Inpatient Hospital Stay (HOSPITAL_COMMUNITY): Payer: Medicare Other | Admitting: Certified Registered Nurse Anesthetist

## 2013-10-01 ENCOUNTER — Inpatient Hospital Stay (HOSPITAL_COMMUNITY)
Admission: RE | Admit: 2013-10-01 | Discharge: 2013-10-02 | DRG: 038 | Disposition: A | Discharge status: Home / Self Care | Payer: Medicare Other | Source: Ambulatory Visit | Attending: Vascular Surgery | Admitting: Vascular Surgery

## 2013-10-01 ENCOUNTER — Encounter (HOSPITAL_COMMUNITY)
Admission: RE | Disposition: A | Discharge status: Home / Self Care | Payer: Self-pay | Source: Ambulatory Visit | Attending: Vascular Surgery

## 2013-10-01 ENCOUNTER — Encounter (HOSPITAL_COMMUNITY): Payer: Self-pay | Admitting: *Deleted

## 2013-10-01 DIAGNOSIS — F17200 Nicotine dependence, unspecified, uncomplicated: Secondary | ICD-10-CM | POA: Diagnosis present

## 2013-10-01 DIAGNOSIS — I6529 Occlusion and stenosis of unspecified carotid artery: Secondary | ICD-10-CM | POA: Diagnosis not present

## 2013-10-01 DIAGNOSIS — K219 Gastro-esophageal reflux disease without esophagitis: Secondary | ICD-10-CM | POA: Diagnosis present

## 2013-10-01 DIAGNOSIS — Z85828 Personal history of other malignant neoplasm of skin: Secondary | ICD-10-CM

## 2013-10-01 DIAGNOSIS — Z79899 Other long term (current) drug therapy: Secondary | ICD-10-CM | POA: Diagnosis not present

## 2013-10-01 DIAGNOSIS — N390 Urinary tract infection, site not specified: Secondary | ICD-10-CM | POA: Diagnosis not present

## 2013-10-01 DIAGNOSIS — Z85820 Personal history of malignant melanoma of skin: Secondary | ICD-10-CM | POA: Diagnosis not present

## 2013-10-01 DIAGNOSIS — Z96659 Presence of unspecified artificial knee joint: Secondary | ICD-10-CM

## 2013-10-01 DIAGNOSIS — D126 Benign neoplasm of colon, unspecified: Secondary | ICD-10-CM | POA: Diagnosis not present

## 2013-10-01 DIAGNOSIS — I10 Essential (primary) hypertension: Secondary | ICD-10-CM | POA: Diagnosis present

## 2013-10-01 DIAGNOSIS — Z7982 Long term (current) use of aspirin: Secondary | ICD-10-CM | POA: Diagnosis not present

## 2013-10-01 DIAGNOSIS — E785 Hyperlipidemia, unspecified: Secondary | ICD-10-CM | POA: Diagnosis not present

## 2013-10-01 DIAGNOSIS — Z860100 Personal history of colon polyps, unspecified: Secondary | ICD-10-CM

## 2013-10-01 HISTORY — PX: PATCH ANGIOPLASTY: SHX6230

## 2013-10-01 HISTORY — PX: ENDARTERECTOMY: SHX5162

## 2013-10-01 HISTORY — PX: CAROTID ENDARTERECTOMY: SUR193

## 2013-10-01 SURGERY — ENDARTERECTOMY, CAROTID
Anesthesia: General | Site: Neck | Laterality: Right

## 2013-10-01 MED ORDER — EPHEDRINE SULFATE 50 MG/ML IJ SOLN
INTRAMUSCULAR | Status: AC
  Filled 2013-10-01: qty 1

## 2013-10-01 MED ORDER — SODIUM CHLORIDE 0.9 % IV SOLN
500.0000 mL | Freq: Once | INTRAVENOUS | Status: AC | PRN
  Administered 2013-10-01 (×2): 500 mL via INTRAVENOUS

## 2013-10-01 MED ORDER — ROCURONIUM BROMIDE 50 MG/5ML IV SOLN
INTRAVENOUS | Status: AC
  Filled 2013-10-01: qty 1

## 2013-10-01 MED ORDER — EPHEDRINE SULFATE 50 MG/ML IJ SOLN
INTRAMUSCULAR | Status: DC | PRN
  Administered 2013-10-01 (×2): 10 mg via INTRAVENOUS

## 2013-10-01 MED ORDER — DOPAMINE-DEXTROSE 3.2-5 MG/ML-% IV SOLN
3.0000 ug/kg/min | INTRAVENOUS | Status: DC
  Administered 2013-10-01: 3 ug/kg/min via INTRAVENOUS
  Administered 2013-10-01: 4 ug/kg/min via INTRAVENOUS

## 2013-10-01 MED ORDER — METOPROLOL TARTRATE 1 MG/ML IV SOLN: 2.0000 mg | INTRAVENOUS | Status: DC | PRN

## 2013-10-01 MED ORDER — NEOSTIGMINE METHYLSULFATE 10 MG/10ML IV SOLN
INTRAVENOUS | Status: AC
  Filled 2013-10-01: qty 1

## 2013-10-01 MED ORDER — OXYCODONE-ACETAMINOPHEN 5-325 MG PO TABS: 1.0000 | ORAL_TABLET | ORAL | Status: DC | PRN

## 2013-10-01 MED ORDER — 0.9 % SODIUM CHLORIDE (POUR BTL) OPTIME
TOPICAL | Status: DC | PRN
  Administered 2013-10-01: 2000 mL

## 2013-10-01 MED ORDER — OXYCODONE-ACETAMINOPHEN 5-325 MG PO TABS: 1.0000 | ORAL_TABLET | Freq: Four times a day (QID) | ORAL | Status: DC | PRN

## 2013-10-01 MED ORDER — STERILE WATER FOR INJECTION IJ SOLN
INTRAMUSCULAR | Status: AC
  Filled 2013-10-01: qty 10

## 2013-10-01 MED ORDER — DOPAMINE-DEXTROSE 3.2-5 MG/ML-% IV SOLN
INTRAVENOUS | Status: AC
  Filled 2013-10-01: qty 250

## 2013-10-01 MED ORDER — ALBUMIN HUMAN 5 % IV SOLN
INTRAVENOUS | Status: AC
  Filled 2013-10-01: qty 250

## 2013-10-01 MED ORDER — PANTOPRAZOLE SODIUM 40 MG PO TBEC: 40.0000 mg | DELAYED_RELEASE_TABLET | Freq: Every day | ORAL | Status: DC

## 2013-10-01 MED ORDER — ROCURONIUM BROMIDE 100 MG/10ML IV SOLN
INTRAVENOUS | Status: DC | PRN
  Administered 2013-10-01: 50 mg via INTRAVENOUS
  Administered 2013-10-01: 30 mg via INTRAVENOUS

## 2013-10-01 MED ORDER — PHENOL 1.4 % MT LIQD: 1.0000 | OROMUCOSAL | Status: DC | PRN

## 2013-10-01 MED ORDER — SENNOSIDES-DOCUSATE SODIUM 8.6-50 MG PO TABS
1.0000 | ORAL_TABLET | Freq: Every evening | ORAL | Status: DC | PRN
  Filled 2013-10-01: qty 1

## 2013-10-01 MED ORDER — SODIUM CHLORIDE 0.9 % IV SOLN
INTRAVENOUS | Status: DC
  Administered 2013-10-01: 20:00:00 via INTRAVENOUS

## 2013-10-01 MED ORDER — PROTAMINE SULFATE 10 MG/ML IV SOLN
INTRAVENOUS | Status: DC | PRN
  Administered 2013-10-01: 10 mg via INTRAVENOUS
  Administered 2013-10-01: 20 mg via INTRAVENOUS

## 2013-10-01 MED ORDER — NEOSTIGMINE METHYLSULFATE 10 MG/10ML IV SOLN
INTRAVENOUS | Status: DC | PRN
  Administered 2013-10-01 (×2): 2 mg via INTRAVENOUS

## 2013-10-01 MED ORDER — ONDANSETRON HCL 4 MG/2ML IJ SOLN
INTRAMUSCULAR | Status: DC | PRN
  Administered 2013-10-01: 4 mg via INTRAVENOUS

## 2013-10-01 MED ORDER — LIDOCAINE HCL (PF) 1 % IJ SOLN
INTRAMUSCULAR | Status: AC
  Filled 2013-10-01: qty 30

## 2013-10-01 MED ORDER — ONDANSETRON HCL 4 MG/2ML IJ SOLN: 4.0000 mg | Freq: Four times a day (QID) | INTRAMUSCULAR | Status: DC | PRN

## 2013-10-01 MED ORDER — HEPARIN SODIUM (PORCINE) 1000 UNIT/ML IJ SOLN
INTRAMUSCULAR | Status: DC | PRN
  Administered 2013-10-01: 9 mL via INTRAVENOUS

## 2013-10-01 MED ORDER — LOSARTAN POTASSIUM 50 MG PO TABS
100.0000 mg | ORAL_TABLET | Freq: Every day | ORAL | Status: DC
  Filled 2013-10-01: qty 2

## 2013-10-01 MED ORDER — ONDANSETRON HCL 4 MG/2ML IJ SOLN
INTRAMUSCULAR | Status: AC
  Filled 2013-10-01: qty 2

## 2013-10-01 MED ORDER — MAGNESIUM SULFATE 40 MG/ML IJ SOLN: 2.0000 g | Freq: Every day | INTRAMUSCULAR | Status: DC | PRN

## 2013-10-01 MED ORDER — ALBUMIN HUMAN 5 % IV SOLN
INTRAVENOUS | Status: DC | PRN
Start: 2013-10-01 — End: 2013-10-01
  Administered 2013-10-01: 09:00:00 via INTRAVENOUS

## 2013-10-01 MED ORDER — ALUM & MAG HYDROXIDE-SIMETH 200-200-20 MG/5ML PO SUSP: 15.0000 mL | ORAL | Status: DC | PRN

## 2013-10-01 MED ORDER — ALBUMIN HUMAN 5 % IV SOLN
12.5000 g | Freq: Once | INTRAVENOUS | Status: AC
  Administered 2013-10-01: 12.5 g via INTRAVENOUS

## 2013-10-01 MED ORDER — MIDAZOLAM HCL 5 MG/5ML IJ SOLN
INTRAMUSCULAR | Status: DC | PRN
  Administered 2013-10-01: 2 mg via INTRAVENOUS

## 2013-10-01 MED ORDER — GLYCOPYRROLATE 0.2 MG/ML IJ SOLN
INTRAMUSCULAR | Status: AC
  Filled 2013-10-01: qty 6

## 2013-10-01 MED ORDER — LIDOCAINE HCL (CARDIAC) 20 MG/ML IV SOLN
INTRAVENOUS | Status: AC
  Filled 2013-10-01: qty 5

## 2013-10-01 MED ORDER — HYDROCHLOROTHIAZIDE 25 MG PO TABS
25.0000 mg | ORAL_TABLET | Freq: Every day | ORAL | Status: DC
  Filled 2013-10-01: qty 1

## 2013-10-01 MED ORDER — MORPHINE SULFATE 2 MG/ML IJ SOLN
2.0000 mg | INTRAMUSCULAR | Status: DC | PRN
  Administered 2013-10-01: 2 mg via INTRAVENOUS
  Filled 2013-10-01: qty 1

## 2013-10-01 MED ORDER — FENTANYL CITRATE 0.05 MG/ML IJ SOLN
INTRAMUSCULAR | Status: AC
  Filled 2013-10-01: qty 5

## 2013-10-01 MED ORDER — ACETAMINOPHEN 650 MG RE SUPP
325.0000 mg | RECTAL | Status: DC | PRN
Start: 2013-10-01 — End: 2013-10-02

## 2013-10-01 MED ORDER — LOSARTAN POTASSIUM-HCTZ 100-25 MG PO TABS: 1.0000 | ORAL_TABLET | Freq: Every day | ORAL | Status: DC

## 2013-10-01 MED ORDER — ATORVASTATIN CALCIUM 20 MG PO TABS
20.0000 mg | ORAL_TABLET | Freq: Every day | ORAL | Status: DC
  Filled 2013-10-01: qty 1

## 2013-10-01 MED ORDER — BISACODYL 10 MG RE SUPP: 10.0000 mg | Freq: Every day | RECTAL | Status: DC | PRN

## 2013-10-01 MED ORDER — SODIUM CHLORIDE 0.9 % IR SOLN
Status: DC | PRN
  Administered 2013-10-01: 08:00:00

## 2013-10-01 MED ORDER — CLINDAMYCIN HCL 300 MG PO CAPS: 600.0000 mg | ORAL_CAPSULE | ORAL | Status: DC

## 2013-10-01 MED ORDER — PHENYLEPHRINE HCL 10 MG/ML IJ SOLN
10.0000 mg | INTRAVENOUS | Status: DC | PRN
  Administered 2013-10-01: 20 ug/min via INTRAVENOUS

## 2013-10-01 MED ORDER — LABETALOL HCL 5 MG/ML IV SOLN: 10.0000 mg | INTRAVENOUS | Status: DC | PRN

## 2013-10-01 MED ORDER — PROPOFOL 10 MG/ML IV BOLUS
INTRAVENOUS | Status: AC
  Filled 2013-10-01: qty 20

## 2013-10-01 MED ORDER — DOCUSATE SODIUM 100 MG PO CAPS: 100.0000 mg | ORAL_CAPSULE | Freq: Every day | ORAL | Status: DC

## 2013-10-01 MED ORDER — GUAIFENESIN-DM 100-10 MG/5ML PO SYRP: 15.0000 mL | ORAL_SOLUTION | ORAL | Status: DC | PRN

## 2013-10-01 MED ORDER — MIDAZOLAM HCL 2 MG/2ML IJ SOLN
INTRAMUSCULAR | Status: AC
  Filled 2013-10-01: qty 2

## 2013-10-01 MED ORDER — LACTATED RINGERS IV SOLN
INTRAVENOUS | Status: DC | PRN
  Administered 2013-10-01 (×2): via INTRAVENOUS

## 2013-10-01 MED ORDER — ACETAMINOPHEN 325 MG PO TABS: 325.0000 mg | ORAL_TABLET | ORAL | Status: DC | PRN

## 2013-10-01 MED ORDER — FENTANYL CITRATE 0.05 MG/ML IJ SOLN
25.0000 ug | INTRAMUSCULAR | Status: DC | PRN
  Administered 2013-10-01: 25 ug via INTRAVENOUS

## 2013-10-01 MED ORDER — FENTANYL CITRATE 0.05 MG/ML IJ SOLN
INTRAMUSCULAR | Status: AC
  Filled 2013-10-01: qty 2

## 2013-10-01 MED ORDER — POTASSIUM CHLORIDE CRYS ER 20 MEQ PO TBCR: 20.0000 meq | EXTENDED_RELEASE_TABLET | Freq: Every day | ORAL | Status: DC | PRN

## 2013-10-01 MED ORDER — TADALAFIL 5 MG PO TABS: 5.0000 mg | ORAL_TABLET | Freq: Every day | ORAL | Status: DC

## 2013-10-01 MED ORDER — ENOXAPARIN SODIUM 40 MG/0.4ML ~~LOC~~ SOLN
40.0000 mg | SUBCUTANEOUS | Status: DC
  Filled 2013-10-01 (×2): qty 0.4

## 2013-10-01 MED ORDER — FENTANYL CITRATE 0.05 MG/ML IJ SOLN
INTRAMUSCULAR | Status: DC | PRN
  Administered 2013-10-01 (×2): 100 ug via INTRAVENOUS
  Administered 2013-10-01: 50 ug via INTRAVENOUS

## 2013-10-01 MED ORDER — HYDRALAZINE HCL 20 MG/ML IJ SOLN: 10.0000 mg | INTRAMUSCULAR | Status: DC | PRN

## 2013-10-01 MED ORDER — GLYCOPYRROLATE 0.2 MG/ML IJ SOLN
INTRAMUSCULAR | Status: DC | PRN
  Administered 2013-10-01: 0.4 mg via INTRAVENOUS
  Administered 2013-10-01: 0.2 mg via INTRAVENOUS
  Administered 2013-10-01: 0.4 mg via INTRAVENOUS
  Administered 2013-10-01: 0.2 mg via INTRAVENOUS

## 2013-10-01 MED ORDER — ASPIRIN 325 MG PO TABS
325.0000 mg | ORAL_TABLET | Freq: Every day | ORAL | Status: DC
  Filled 2013-10-01: qty 1

## 2013-10-01 MED ORDER — PROPOFOL 10 MG/ML IV BOLUS
INTRAVENOUS | Status: DC | PRN
  Administered 2013-10-01: 150 mg via INTRAVENOUS

## 2013-10-01 MED ORDER — LIDOCAINE HCL (CARDIAC) 20 MG/ML IV SOLN
INTRAVENOUS | Status: DC | PRN
  Administered 2013-10-01: 80 mg via INTRAVENOUS

## 2013-10-01 MED ORDER — PROBENECID 500 MG PO TABS
500.0000 mg | ORAL_TABLET | Freq: Every day | ORAL | Status: DC
  Administered 2013-10-02: 500 mg via ORAL
  Filled 2013-10-01 (×2): qty 1

## 2013-10-01 MED ORDER — VANCOMYCIN HCL IN DEXTROSE 1-5 GM/200ML-% IV SOLN
1000.0000 mg | Freq: Two times a day (BID) | INTRAVENOUS | Status: AC
  Administered 2013-10-01 – 2013-10-02 (×2): 1000 mg via INTRAVENOUS
  Filled 2013-10-01 (×2): qty 200

## 2013-10-01 SURGICAL SUPPLY — 50 items
APL SKNCLS STERI-STRIP NONHPOA (GAUZE/BANDAGES/DRESSINGS) ×2
BENZOIN TINCTURE PRP APPL 2/3 (GAUZE/BANDAGES/DRESSINGS) ×3 IMPLANT
BLADE 10 SAFETY STRL DISP (BLADE) ×3 IMPLANT
CANISTER SUCTION 2500CC (MISCELLANEOUS) ×3 IMPLANT
CATH ROBINSON RED A/P 18FR (CATHETERS) ×3 IMPLANT
CLIP LIGATING EXTRA MED SLVR (CLIP) ×3 IMPLANT
CLIP LIGATING EXTRA SM BLUE (MISCELLANEOUS) ×3 IMPLANT
COVER SURGICAL LIGHT HANDLE (MISCELLANEOUS) ×3 IMPLANT
CRADLE DONUT ADULT HEAD (MISCELLANEOUS) ×3 IMPLANT
DECANTER SPIKE VIAL GLASS SM (MISCELLANEOUS) IMPLANT
DRAIN HEMOVAC 1/8 X 5 (WOUND CARE) IMPLANT
DRAPE WARM FLUID 44X44 (DRAPE) ×3 IMPLANT
DRSG COVADERM 4X8 (GAUZE/BANDAGES/DRESSINGS) ×1 IMPLANT
ELECT REM PT RETURN 9FT ADLT (ELECTROSURGICAL) ×3
ELECTRODE REM PT RTRN 9FT ADLT (ELECTROSURGICAL) ×2 IMPLANT
EVACUATOR SILICONE 100CC (DRAIN) IMPLANT
GEL ULTRASOUND 20GR AQUASONIC (MISCELLANEOUS) IMPLANT
GLOVE BIOGEL PI IND STRL 7.0 (GLOVE) IMPLANT
GLOVE BIOGEL PI IND STRL 7.5 (GLOVE) IMPLANT
GLOVE BIOGEL PI INDICATOR 7.0 (GLOVE) ×1
GLOVE BIOGEL PI INDICATOR 7.5 (GLOVE) ×1
GLOVE ECLIPSE 6.5 STRL STRAW (GLOVE) ×1 IMPLANT
GLOVE SS BIOGEL STRL SZ 7 (GLOVE) IMPLANT
GLOVE SS BIOGEL STRL SZ 7.5 (GLOVE) ×2 IMPLANT
GLOVE SUPERSENSE BIOGEL SZ 7 (GLOVE) ×1
GLOVE SUPERSENSE BIOGEL SZ 7.5 (GLOVE) ×1
GOWN STRL REUS W/ TWL LRG LVL3 (GOWN DISPOSABLE) ×6 IMPLANT
GOWN STRL REUS W/TWL LRG LVL3 (GOWN DISPOSABLE) ×9
KIT BASIN OR (CUSTOM PROCEDURE TRAY) ×3 IMPLANT
KIT ROOM TURNOVER OR (KITS) ×3 IMPLANT
NEEDLE 22X1 1/2 (OR ONLY) (NEEDLE) IMPLANT
NS IRRIG 1000ML POUR BTL (IV SOLUTION) ×6 IMPLANT
PACK CAROTID (CUSTOM PROCEDURE TRAY) ×3 IMPLANT
PAD ARMBOARD 7.5X6 YLW CONV (MISCELLANEOUS) ×6 IMPLANT
PATCH HEMASHIELD 8X75 (Vascular Products) ×1 IMPLANT
SHUNT CAROTID BYPASS 10 (VASCULAR PRODUCTS) ×1 IMPLANT
SHUNT CAROTID BYPASS 12FRX15.5 (VASCULAR PRODUCTS) IMPLANT
SPONGE INTESTINAL PEANUT (DISPOSABLE) ×1 IMPLANT
STRIP CLOSURE SKIN 1/2X4 (GAUZE/BANDAGES/DRESSINGS) ×3 IMPLANT
SUT ETHILON 3 0 PS 1 (SUTURE) IMPLANT
SUT PROLENE 6 0 CC (SUTURE) ×5 IMPLANT
SUT SILK 3 0 (SUTURE)
SUT SILK 3-0 18XBRD TIE 12 (SUTURE) IMPLANT
SUT VIC AB 3-0 SH 27 (SUTURE) ×6
SUT VIC AB 3-0 SH 27X BRD (SUTURE) ×4 IMPLANT
SUT VICRYL 4-0 PS2 18IN ABS (SUTURE) ×3 IMPLANT
SYR CONTROL 10ML LL (SYRINGE) IMPLANT
TOWEL OR 17X24 6PK STRL BLUE (TOWEL DISPOSABLE) ×3 IMPLANT
TOWEL OR 17X26 10 PK STRL BLUE (TOWEL DISPOSABLE) ×3 IMPLANT
WATER STERILE IRR 1000ML POUR (IV SOLUTION) ×3 IMPLANT

## 2013-10-01 NOTE — Anesthesia Procedure Notes (Signed)
Procedure Name: Intubation Date/Time: 10/01/2013 7:44 AM Performed by: Ollen Bowl Pre-anesthesia Checklist: Patient identified, Emergency Drugs available, Suction available, Patient being monitored and Timeout performed Patient Re-evaluated:Patient Re-evaluated prior to inductionOxygen Delivery Method: Circle system utilized and Simple face mask Preoxygenation: Pre-oxygenation with 100% oxygen Intubation Type: IV induction Ventilation: Mask ventilation without difficulty Laryngoscope Size: Mac and 4 Grade View: Grade II Tube type: Oral Tube size: 7.5 mm Number of attempts: 1 Airway Equipment and Method: Patient positioned with wedge pillow and Stylet Placement Confirmation: ETT inserted through vocal cords under direct vision,  positive ETCO2 and breath sounds checked- equal and bilateral Secured at: 23 cm Tube secured with: Tape Dental Injury: Teeth and Oropharynx as per pre-operative assessment

## 2013-10-01 NOTE — Anesthesia Postprocedure Evaluation (Signed)
  Anesthesia Post-op Note  Patient: [name]  Procedure(s) Performed: Procedure(s): ENDARTERECTOMY CAROTID-RIGHT (Right) PATCH ANGIOPLASTY USING HEMASHIELD FINESSE PATCH (Right)  Patient Location: PACU  Anesthesia Type:General  Level of Consciousness: awake  Airway and Oxygen Therapy: Patient Spontanous Breathing  Post-op Pain: mild  Post-op Assessment: Post-op Vital signs reviewed  Post-op Vital Signs: Reviewed  Last Vitals:  Filed Vitals:   10/01/13 1245  BP: 98/51  Pulse: 73  Temp:   Resp: 18    Complications: No apparent anesthesia complications

## 2013-10-01 NOTE — H&P (View-Only) (Signed)
Patient name: [name] MRN: [id number] DOB: Oct 11, 1940 Sex: male   Referred by: Virgina Jock  Reason for referral:  Chief Complaint  Patient presents with  . New Evaluation    carotid stenosis     HISTORY OF PRESENT ILLNESS: Patient is a very pleasant 73 year old gentleman who is been very compliant for 10 years of having lifeline screening. His most recent study showed a marked progression in stenosis in his right internal carotid artery. He's all our vascular lab on 08/06/2013 for repeat evaluation this does show proximal 80% stenosis of his right internal carotid artery. No significant stenosis in his left carotid system. He is right-handed. He specifically denies any prior episodes of amaurosis fugax, transient ischemic attack or stroke. He is a lifelong cigar smoker. He does have a history of hyperlipidemia and elevated blood pressure which is controlled with medication. He denies any prior cardiac difficulties.  Past Medical History  Diagnosis Date  . Hyperlipemia   . GERD (gastroesophageal reflux disease)   . Gout   . Hypertension   . Colon polyps     Past Surgical History  Procedure Laterality Date  . Replacement total knee Left   . 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 Marital Status: Divorced    Spouse Name: N/A    Number of Children: N/A  . Years of Education: N/A   Occupational History  . 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 Smoking status: Current Every Day Smoker    Types: Cigars  . Smokeless tobacco: Never Used     Comment: pt states he smokes a couple of cigars per day  . Alcohol Use: 12.6 oz/week    7 Glasses of wine, 14 Shots of liquor per week  . Drug Use: No  . Sexual Activity: Not on file   Other Topics Concern  . Not on file   Social History Narrative  . No narrative on file    Family History  Problem Relation Age of Onset   . Diverticulosis Mother   . Cancer Mother   . Varicose Veins Mother   . Hyperlipidemia Father   . Heart disease Father   . Heart attack Father     Allergies as of 09/14/2013 - Review Complete 09/14/2013  Allergen Reaction Noted  . Cefaclor  09/20/2009  . Penicillins  09/20/2009    Current Outpatient Prescriptions on File Prior to Visit  Medication Sig Dispense Refill  . aspirin 325 MG tablet Take 325 mg by mouth daily.        Marland Kitchen esomeprazole (NEXIUM) 40 MG capsule Take 40 mg by mouth as needed. As needed      . Fenofibrate (TRICOR PO) Take 145 mg by mouth daily.       Marland Kitchen LOSARTAN POTASSIUM PO Take 1 tablet by mouth daily.       Marland Kitchen PROBENECID PO Take 500 mg by mouth daily.       . Tadalafil (CIALIS PO) Take 5 mg by mouth as needed. As needed       No current facility-administered medications on file prior to visit.     REVIEW OF SYSTEMS:  Positives indicated with an "X"  CARDIOVASCULAR:  [ ]  chest pain   [ ]  chest pressure   [ ]  palpitations   [ ]  orthopnea   [ ]  dyspnea  on exertion   [ ]  claudication   [ ]  rest pain   [ ]  DVT   [ ]  phlebitis PULMONARY:   [ ]  productive cough   [ ]  asthma   [ ]  wheezing NEUROLOGIC:   [ ]  weakness  [ ]  paresthesias  [ ]  aphasia  [ ]  amaurosis  [ ]  dizziness HEMATOLOGIC:   [ ]  bleeding problems   [ ]  clotting disorders MUSCULOSKELETAL:  [ ]  joint pain   [ ]  joint swelling GASTROINTESTINAL: [ ]   blood in stool  [ ]   hematemesis GENITOURINARY:  [ ]   dysuria  [ ]   hematuria PSYCHIATRIC:  [ ]  history of major depression INTEGUMENTARY:  [ ]  rashes  [ ]  ulcers CONSTITUTIONAL:  [ ]  fever   [ ]  chills  PHYSICAL EXAMINATION:  General: The patient is a well-nourished male, in no acute distress. Vital signs are BP 111/75  Pulse 99  Ht 5\' 10"  (1.778 m)  Wt 224 lb (101.606 kg)  BMI 32.14 kg/m2  SpO2 97% Pulmonary: There is a good air exchange bilaterally without wheezing or rales. Abdomen: Soft and non-tender with normal pitch bowel  sounds. Musculoskeletal: There are no major deformities.  There is no significant extremity pain. Neurologic: No focal weakness or paresthesias are detected, Skin: There are no ulcer or rashes noted. Psychiatric: The patient has normal affect. Cardiovascular: There is a regular rate and rhythm without significant murmur appreciated. Carotid arteries without bruits bilaterally Pulse status 2+ radial 2+ femoral and 2 posterior cells pedis pulses bilaterally  VVS Vascular Lab Studies:  Markedly elevated velocities right carotid system with velocity in the systolic of 932 and diastolic 99 cm per sec  Impression and Plan:  High-grade right internal carotid artery stenosis. I discussed the significance of this great length with the patient. I did explain that he is at the threshold of repair. I explained that we could repeat the study in 6 months but certainly feel comfortable with proceeding with surgery with his stenosis at the 80% range. He does not want to continue to watch this off of this to the appropriate he wished to proceed with elective repair. I would agree with his young age in excellent health. I did explain the procedure and one night hospitalization typically. Also explained the 1.5 risk of stroke with surgery and also the very slight risk for cranial nerve injury bleeding or infection. He did have impacted salivary gland excised on the right side several years ago and I do not feel that this will make any difference regarding his surgery. He will check his schedule. 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

## 2013-10-01 NOTE — Transfer of Care (Signed)
Immediate Anesthesia Transfer of Care Note  Patient: [name]  Procedure(s) Performed: Procedure(s): ENDARTERECTOMY CAROTID-RIGHT (Right) PATCH ANGIOPLASTY USING HEMASHIELD FINESSE PATCH (Right)  Patient Location: PACU  Anesthesia Type:General  Level of Consciousness: awake, alert  and oriented  Airway & Oxygen Therapy: Patient Spontanous Breathing and Patient connected to face mask oxygen  Post-op Assessment: Report given to PACU RN, Post -op Vital signs reviewed and stable, Patient moving all extremities X 4 and Patient able to stick tongue midline  Post vital signs: Reviewed and stable  Complications: No apparent anesthesia complications

## 2013-10-01 NOTE — Interval H&P Note (Signed)
History and Physical Interval Note:  10/01/2013 6:52 AM  [name]  has presented today for surgery, with the diagnosis of Right Internal Carotid Artery Stenosis  The various methods of treatment have been discussed with the patient and family. After consideration of risks, benefits and other options for treatment, the patient has consented to  Procedure(s): ENDARTERECTOMY CAROTID-RIGHT (Right) as a surgical intervention .  The patient's history has been reviewed, patient examined, no change in status, stable for surgery.  I have reviewed the patient's chart and labs.  Questions were answered to the patient's satisfaction.     Arvilla Meres Early

## 2013-10-01 NOTE — Anesthesia Preprocedure Evaluation (Signed)
Anesthesia Evaluation  Patient identified by MRN, date of birth, ID band Patient awake    Reviewed: Allergy & Precautions, H&P , NPO status   Airway Mallampati: II      Dental   Pulmonary sleep apnea , Current Smoker,  breath sounds clear to auscultation        Cardiovascular hypertension, + Peripheral Vascular Disease Rhythm:Regular Rate:Normal     Neuro/Psych    GI/Hepatic GERD-  ,  Endo/Other    Renal/GU      Musculoskeletal   Abdominal   Peds  Hematology   Anesthesia Other Findings   Reproductive/Obstetrics                           Anesthesia Physical Anesthesia Plan  ASA: III  Anesthesia Plan: General   Post-op Pain Management:    Induction: Intravenous  Airway Management Planned: Oral ETT  Additional Equipment: Arterial line  Intra-op Plan:   Post-operative Plan: Possible Post-op intubation/ventilation  Informed Consent: I have reviewed the patients History and Physical, chart, labs and discussed the procedure including the risks, benefits and alternatives for the proposed anesthesia with the patient or authorized representative who has indicated his/her understanding and acceptance.   Dental advisory given  Plan Discussed with: CRNA and Anesthesiologist  Anesthesia Plan Comments:         Anesthesia Quick Evaluation

## 2013-10-01 NOTE — Op Note (Signed)
     Patient name: [name] MRN: [id number] DOB: 06/11/1940 Sex: male  10/01/2013 Pre-operative Diagnosis: Asymptomatic right carotid stenosis Post-operative diagnosis:  Same Surgeon:  Rosetta Posner, M.D. Assistants:  Theda Sers Procedure:    right carotid Endarterectomy with Dacron patch angioplasty Anesthesia:  General Blood Loss:  See anesthesia record   Indications for surgery:  Severe asymptomatic  Procedure in detail:  The patient was taken to the operating and placed in the supine position. The neck was prepped and draped in the usual sterile fashion. An incision was made anterior to the sternocleidomastoid muscle and continued with electrocautery through the platysma muscle. The muscle was retracted posteriorly and the carotid sheath was opened. The facial vein was ligated with 2-0 silk ties and divided. The common carotid artery was encircled with an umbilical tape and Rummel tourniquet. Dissection was continued onto the carotid bifurcation. The superior thyroid artery was controlled with a 2-0 silk Potts tie. The external carotid organ was encircled with a vessel loop and the internal carotid was encircled with umbilical tape and Rummel tourniquet. The hypoglossal and vagus nerves were identified and preserved.  The patient was given systemic heparinization. After adequate circulation time, the internal,external and common carotid arteries were occluded. The common carotid was opened with an 11 blade and the arteriotomy was continued with Potts scissors onto the internal carotid artery. A 10 shunt was passed up the internal carotid artery, allowed to back bleed, and then passed down the common carotid artery. The shunt was secured with Rummel tourniquet. The endarterectomy was begun on the common carotid artery  plaque was divided proximally with Potts scissors. The endarterectomy was continued onto the carotid bifurcation. The external carotid was endarterectomized by eversion technique  and the internal carotid artery was endarterectomized in an open fashion. Remaining debris was removed from the endarterectomy plane. A Dacron patch was brought to the field and sewn as a patch angioplasty. Prior to completing the anastomosis, the shunt was removed and the usual flushing maneuvers were undertaken. The anastomosis was then completed and flow was restored first to the external and then the internal carotid artery. Excellent flow characteristics were noted with hand-held Doppler in the internal and external carotid arteries.  The patient was given protamine to reverse the heparin. Hemostasis was obtained with electrocautery. The wounds were irrigated with saline. The wound was closed by first reapproximating the sternocleidomastoid muscle over the carotid artery with interrupted 3-0 Vicryl sutures. Next, the platysma was closed with a running 3-0 Vicryl suture. The skin was closed with a 4-0 subcuticular Vicryl suture. Benzoin and Steri-Strips were applied to the incision. A sterile dressing was placed over the incision. All sponge and needle counts were correct. The patient was awakened in the operating room, neurologically intact. They were transferred to the PACU in stable condition.  Carotid stenosis at surgery:80%  Disposition:  To PACU in stable condition,neurologically intact  Relevant Operative Details:  Normal anatomy  Rosetta Posner, M.D. [facility] [place] Office: [phone] Pager:  [phone]

## 2013-10-02 LAB — CBC
HCT: 35.7 % — ABNORMAL LOW (ref 39.0–52.0)
HEMATOCRIT: 39 % (ref 39.0–52.0)
Hemoglobin: 11.1 g/dL — ABNORMAL LOW (ref 13.0–17.0)
Hemoglobin: 12.6 g/dL — ABNORMAL LOW (ref 13.0–17.0)
MCH: 28.4 pg (ref 26.0–34.0)
MCH: 31 pg (ref 26.0–34.0)
MCHC: 31.1 g/dL (ref 30.0–36.0)
MCHC: 32.3 g/dL (ref 30.0–36.0)
MCV: 91.3 fL (ref 78.0–100.0)
MCV: 96.1 fL (ref 78.0–100.0)
PLATELETS: 199 10*3/uL (ref 150–400)
Platelets: 136 10*3/uL — ABNORMAL LOW (ref 150–400)
RBC: 3.91 MIL/uL — AB (ref 4.22–5.81)
RBC: 4.06 MIL/uL — ABNORMAL LOW (ref 4.22–5.81)
RDW: 14.3 % (ref 11.5–15.5)
RDW: 14.1 % (ref 11.5–15.5)
WBC: 31.2 10*3/uL — ABNORMAL HIGH (ref 4.0–10.5)
WBC: 5.9 10*3/uL (ref 4.0–10.5)

## 2013-10-02 LAB — URINALYSIS, ROUTINE W REFLEX MICROSCOPIC
Bilirubin Urine: NEGATIVE
GLUCOSE, UA: NEGATIVE mg/dL
Hgb urine dipstick: NEGATIVE
KETONES UR: NEGATIVE mg/dL
LEUKOCYTES UA: NEGATIVE
Nitrite: NEGATIVE
Protein, ur: NEGATIVE mg/dL
Specific Gravity, Urine: 1.016 (ref 1.005–1.030)
Urobilinogen, UA: 0.2 mg/dL (ref 0.0–1.0)
pH: 5 (ref 5.0–8.0)

## 2013-10-02 LAB — BASIC METABOLIC PANEL
BUN: 18 mg/dL (ref 6–23)
CALCIUM: 9 mg/dL (ref 8.4–10.5)
CO2: 24 mEq/L (ref 19–32)
Chloride: 106 mEq/L (ref 96–112)
Creatinine, Ser: 1.26 mg/dL (ref 0.50–1.35)
GFR calc Af Amer: 65 mL/min — ABNORMAL LOW (ref >90)
GFR calc non Af Amer: 56 mL/min — ABNORMAL LOW (ref >90)
Glucose, Bld: 199 mg/dL — ABNORMAL HIGH (ref 70–99)
Potassium: 4.8 mEq/L (ref 3.7–5.3)
SODIUM: 142 meq/L (ref 137–147)

## 2013-10-02 MED ORDER — CIPROFLOXACIN HCL 500 MG PO TABS: 500.0000 mg | ORAL_TABLET | Freq: Two times a day (BID) | ORAL | Status: DC

## 2013-10-02 NOTE — Progress Notes (Addendum)
  [facility] Progress Note  10/02/2013 8:04 AM 1 Day Post-Op  Subjective:  No complaints this am with exception of burning with urination  afebrile HR 40's-60's 95'K-932'I systolic 71% RA  Gtts: Dopamine   Filed Vitals:   10/02/13 0725  BP: 124/80  Pulse: 57  Temp:   Resp: 25     Physical Exam: Neuro:  In tact Incision:  C/d/i  CBC    Component Value Date/Time   WBC 31.2* 10/02/2013 0420   RBC 3.91* 10/02/2013 0420   HGB 11.1* 10/02/2013 0420   HCT 35.7* 10/02/2013 0420   PLT 199 10/02/2013 0420   MCV 91.3 10/02/2013 0420   MCH 28.4 10/02/2013 0420   MCHC 31.1 10/02/2013 0420   RDW 14.3 10/02/2013 0420   LYMPHSABS 2.0 05/09/2011 0908   MONOABS 0.4 05/09/2011 0908   EOSABS 0.2 05/09/2011 0908   BASOSABS 0.0 05/09/2011 0908    BMET    Component Value Date/Time   NA 142 10/02/2013 0420   K 4.8 10/02/2013 0420   CL 106 10/02/2013 0420   CO2 24 10/02/2013 0420   GLUCOSE 199* 10/02/2013 0420   BUN 18 10/02/2013 0420   CREATININE 1.26 10/02/2013 0420   CALCIUM 9.0 10/02/2013 0420   GFRNONAA 56* 10/02/2013 0420   GFRAA 65* 10/02/2013 0420     Intake/Output Summary (Last 24 hours) at 10/02/13 0804 Last data filed at 10/02/13 0725  Gross per 24 hour  Intake 3288.48 ml  Output    800 ml  Net 2488.48 ml      Assessment/Plan:  This is a 73 y.o. male who is s/p right CEA 1 Day Post-Op  -pt is doing well this am.  He does have an elevated WBC this am.  He states he has burning with urination and a urine sample has been collected.  He is afebrile.  Will empirically start him on Cipro for presumed UTI and will f/u the u/a and Cx outpatient. -will redraw stat cbc to check WBC -pt is requiring dopamine for BP support this am-wean dopamine -pt did have some bradycardia-he is not on a beta blocker and is not symptomatic -pt neuro exam is in tact -pt has ambulated -pt has voided   Leontine Locket, PA-C [facility] [phone] Agree with  above assessment Neck incision looks good Neurologic exam normal Unexplained increase in white count from 6600 to 31,000-we will repeat UA was negative  Will discharge on 1 week of Cipro   Addendum: Pt repeat CBC with WBC of 5.9 and U/A negative.  Will not send on Cipro.  Hulen Shouts Rhyne 10/02/2013 10:29 AM Agree with above-white count was lab error

## 2013-10-02 NOTE — Progress Notes (Signed)
Kerin Salen to be D/C'd Home per MD order.  Discussed with the patient and all questions fully answered.    Medication List         ALEVE 220 MG tablet  Generic drug:  naproxen sodium  Take 440 mg by mouth daily.     aspirin 325 MG tablet  Take 325 mg by mouth at bedtime.     AXIRON 30 MG/ACT Soln  Generic drug:  Testosterone  Place 180 mg onto the skin See admin instructions. Apply 3 pumps (90 mg) in each armpit every morning     clindamycin 150 MG capsule  Commonly known as:  CLEOCIN  Take 600 mg by mouth See admin instructions. Take 4 capsules by mouth 1 hour prior to dental appointment     esomeprazole 40 MG capsule  Commonly known as:  NEXIUM  Take 40 mg by mouth daily as needed (acid reflux).     fenofibrate 145 MG tablet  Commonly known as:  TRICOR  Take 145 mg by mouth daily.     Fish Oil 1200 MG Caps  Take 4,800 mg by mouth at bedtime.     losartan-hydrochlorothiazide 100-25 MG per tablet  Commonly known as:  HYZAAR  Take 1 tablet by mouth daily.     oxyCODONE-acetaminophen 5-325 MG per tablet  Commonly known as:  PERCOCET/ROXICET  Take 1 tablet by mouth every 6 (six) hours as needed for moderate pain.     probenecid 500 MG tablet  Commonly known as:  BENEMID  Take 500 mg by mouth daily.     rosuvastatin 10 MG tablet  Commonly known as:  CRESTOR  Take 10 mg by mouth daily.     tadalafil 5 MG tablet  Commonly known as:  CIALIS  Take 5 mg by mouth daily.     VITAMIN D PO  Take 6,000 Units by mouth daily.        VVS, Skin clean, dry and intact without evidence of skin break down, no evidence of skin tears noted. IV catheter discontinued intact. Site without signs and symptoms of complications. Dressing and pressure applied.  An After Visit Summary was printed and given to the patient.  D/c education completed with patient/family including follow up instructions, medication list, d/c activities limitations if indicated, with other d/c instructions  as indicated by MD - patient able to verbalize understanding, all questions fully answered.   Patient instructed to return to ED, call 911, or call MD for any changes in condition.   Patient escorted via [place], and D/C home via private auto.  Gracy Bruins 10/02/2013 11:47 AM

## 2013-10-02 NOTE — Discharge Summary (Signed)
[facility] Discharge Summary  [name] [birth year] 73 y.o. male  [id number]  Admission Date: 10/01/2013  Discharge Date: 10/02/13  Physician: Rosetta Posner, MD  Admission Diagnosis: Right Internal Carotid Artery Stenosis   HPI:   This is a 73 y.o. male who is been very compliant for 10 years of having lifeline screening. His most recent study showed a marked progression in stenosis in his right internal carotid artery. He's all our vascular lab on 08/06/2013 for repeat evaluation this does show proximal 80% stenosis of his right internal carotid artery. No significant stenosis in his left carotid system. He is right-handed. He specifically denies any prior episodes of amaurosis fugax, transient ischemic attack or stroke. He is a lifelong cigar smoker. He does have a history of hyperlipidemia and elevated blood pressure which is controlled with medication. He denies any prior cardiac difficulties.  Hospital Course:  The patient was admitted to the hospital and taken to the operating room on 10/01/2013 and underwent right carotid endarterectomy.  The pt tolerated the procedure well and was transported to the PACU in good condition.   By POD 1, the pt neuro status in tact.  He did have a leukocytosis being 31K.  This was repeated and was normal at [facility].  He was requiring a dopamine gtt for BP support and this was weaned successfully.  The remainder of the hospital course consisted of increasing mobilization and increasing intake of solids without difficulty.    Recent Labs  10/02/13 0420  NA 142  K 4.8  CL 106  CO2 24  GLUCOSE 199*  BUN 18  CALCIUM 9.0    Recent Labs  10/02/13 0420 10/02/13 0930  WBC 31.2* 5.9  HGB 11.1* 12.6*  HCT 35.7* 39.0  PLT 199 136*   No results found for this basename: INR,  in the last 72 hours  Discharge Instructions:   The patient is discharged to home with extensive instructions on wound care and progressive  ambulation.  They are instructed not to drive or perform any heavy lifting until returning to see the physician in his office.  Discharge Instructions   CAROTID Sugery: Call MD for difficulty swallowing or speaking; weakness in arms or legs that is a new symtom; severe headache.  If you have increased swelling in the neck and/or  are having difficulty breathing, CALL 911    Complete by:  As directed      Call MD for:  redness, tenderness, or signs of infection (pain, swelling, bleeding, redness, odor or green/yellow discharge around incision site)    Complete by:  As directed      Call MD for:  severe or increased pain, loss or decreased feeling  in affected limb(s)    Complete by:  As directed      Call MD for:  temperature >100.5    Complete by:  As directed      Discharge instructions    Complete by:  As directed   You may shower in 24 hours after surgery.     Driving Restrictions    Complete by:  As directed   No driving for  24 hours     Increase activity slowly    Complete by:  As directed   Walk with assistance use walker or cane as needed     Lifting restrictions    Complete by:  As directed   No lifting for 6 weeks     Resume previous diet  Complete by:  As directed            Discharge Diagnosis:  Right Internal Carotid Artery Stenosis  Secondary Diagnosis: Patient Active Problem List   Diagnosis Date Noted  . Carotid artery stenosis 10/01/2013  . Nonspecific abnormal finding in stool contents 09/28/2013  . Occlusion and stenosis of carotid artery without mention of cerebral infarction 09/14/2013  . Hx of adenomatous colonic polyps 05/08/2011  . Diverticulosis 05/08/2011  . HTN (hypertension) 05/08/2011  . Gout 05/08/2011  . 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 GERD (gastroesophageal reflux disease)   . Gout     pt denies 10/01/2013  . Hypertension   . Colon polyps   . Sleep apnea     had suegery in 2000 for  sleep apnea  . Prostate hypertrophy   . Arthritis   . Skin cancer       Medication List         ALEVE 220 MG tablet  Generic drug:  naproxen sodium  Take 440 mg by mouth daily.     aspirin 325 MG tablet  Take 325 mg by mouth at bedtime.     AXIRON 30 MG/ACT Soln  Generic drug:  Testosterone  Place 180 mg onto the skin See admin instructions. Apply 3 pumps (90 mg) in each armpit every morning     clindamycin 150 MG capsule  Commonly known as:  CLEOCIN  Take 600 mg by mouth See admin instructions. Take 4 capsules by mouth 1 hour prior to dental appointment     esomeprazole 40 MG capsule  Commonly known as:  NEXIUM  Take 40 mg by mouth daily as needed (acid reflux).     fenofibrate 145 MG tablet  Commonly known as:  TRICOR  Take 145 mg by mouth daily.     Fish Oil 1200 MG Caps  Take 4,800 mg by mouth at bedtime.     losartan-hydrochlorothiazide 100-25 MG per tablet  Commonly known as:  HYZAAR  Take 1 tablet by mouth daily.     oxyCODONE-acetaminophen 5-325 MG per tablet  Commonly known as:  PERCOCET/ROXICET  Take 1 tablet by mouth every 6 (six) hours as needed for moderate pain.     probenecid 500 MG tablet  Commonly known as:  BENEMID  Take 500 mg by mouth daily.     rosuvastatin 10 MG tablet  Commonly known as:  CRESTOR  Take 10 mg by mouth daily.     tadalafil 5 MG tablet  Commonly known as:  CIALIS  Take 5 mg by mouth daily.     VITAMIN D PO  Take 6,000 Units by mouth daily.        Roxicet #30 No Refill  Disposition: home  Patient's condition: is Good  Follow up: 1. Dr.  Donnetta Hutching in 2 weeks.   Leontine Locket, PA-C [facility] [phone]  --- For [facility] use --- Instructions: Press F2 to tab through selections.  Delete question if not applicable.   Modified Rankin score at D/C (0-6): 0  IV medication needed for:  1. Hypertension: No 2. Hypotension: Yes  Post-op Complications: No  1. Post-op CVA or TIA:  No  If yes: Event classification (right eye, left eye, right cortical, left cortical, verterobasilar, other): n/a  If yes: Timing of event (intra-op, <6 hrs post-op, >=6 hrs post-op, unknown): n/a  2. CN injury: No  If yes: CN n/a injuried  3. Myocardial infarction: No  If yes: Dx by (EKG or clinical, Troponin): n/a  4.  CHF: No  5.  Dysrhythmia (new): No  6. Wound infection: No  7. Reperfusion symptoms: No  8. Return to OR: No  If yes: return to OR for (bleeding, neurologic, other CEA incision, other): n/a  Discharge medications: Statin use:  Yes If No: [ ]  For Medical reasons, [ ]  Non-compliant, [ ]  Not-indicated ASA use:  Yes  If No: [ ]  For Medical reasons, [ ]  Non-compliant, [ ]  Not-indicated Beta blocker use:  No If No: [ ]  For Medical reasons, [ ]  Non-compliant, [ ]  Not-indicated ACE-Inhibitor use:  No If No: [ ]  For Medical reasons, [ ]  Non-compliant, [ ]  Not-indicated ARB:  yes P2Y12 Antagonist use: No, [ ]  Plavix, [ ]  Plasugrel, [ ]  Ticlopinine, [ ]  Ticagrelor, [ ]  Other, [ ]  No for medical reason, [ ]  Non-compliant, [ ]  Not-indicated Anti-coagulant use:  No, [ ]  Warfarin, [ ]  Rivaroxaban, [ ]  Dabigatran, [ ]  Other, [ ]  No for medical reason, [ ]  Non-compliant, [ ]  Not-indicated

## 2013-10-04 ENCOUNTER — Telehealth: Payer: Self-pay | Admitting: Vascular Surgery

## 2013-10-04 NOTE — Telephone Encounter (Addendum)
Message copied by Gena Fray on Mon Oct 04, 2013 11:38 AM ------      Message from: Peter Minium K      Created: Mon Oct 04, 2013 10:05 AM      Regarding: Schedule                   ----- Message -----         From: Gabriel Earing, PA-C         Sent: 10/02/2013   8:12 AM           To: [facility] Charge Pool            S/p right CEA 10/01/13.  F/u with Dr. Donnetta Hutching in 2 weeks.  Please make note to check u/a from hospitalization.            Thanks,      Aldona Bar ------  10/04/13: lm for pt re: appt info, dpm

## 2013-10-05 ENCOUNTER — Encounter (HOSPITAL_COMMUNITY): Payer: Self-pay | Admitting: Vascular Surgery

## 2013-10-05 NOTE — Progress Notes (Signed)
Utilization review completed.  

## 2013-10-12 ENCOUNTER — Ambulatory Visit (AMBULATORY_SURGERY_CENTER): Payer: Medicare Other | Admitting: Gastroenterology

## 2013-10-12 ENCOUNTER — Encounter: Payer: Self-pay | Admitting: Gastroenterology

## 2013-10-12 VITALS — BP 119/91 | HR 71 | Temp 97.0°F | Resp 20 | Ht 67.0 in | Wt 222.0 lb

## 2013-10-12 DIAGNOSIS — K2990 Gastroduodenitis, unspecified, without bleeding: Secondary | ICD-10-CM

## 2013-10-12 DIAGNOSIS — I2510 Atherosclerotic heart disease of native coronary artery without angina pectoris: Secondary | ICD-10-CM | POA: Diagnosis not present

## 2013-10-12 DIAGNOSIS — R195 Other fecal abnormalities: Secondary | ICD-10-CM | POA: Diagnosis not present

## 2013-10-12 DIAGNOSIS — K2970 Gastritis, unspecified, without bleeding: Secondary | ICD-10-CM

## 2013-10-12 DIAGNOSIS — K2940 Chronic atrophic gastritis without bleeding: Secondary | ICD-10-CM

## 2013-10-12 DIAGNOSIS — I10 Essential (primary) hypertension: Secondary | ICD-10-CM | POA: Diagnosis not present

## 2013-10-12 DIAGNOSIS — D131 Benign neoplasm of stomach: Secondary | ICD-10-CM | POA: Diagnosis not present

## 2013-10-12 DIAGNOSIS — K219 Gastro-esophageal reflux disease without esophagitis: Secondary | ICD-10-CM | POA: Diagnosis not present

## 2013-10-12 DIAGNOSIS — M109 Gout, unspecified: Secondary | ICD-10-CM | POA: Diagnosis not present

## 2013-10-12 HISTORY — PX: ESOPHAGOGASTRODUODENOSCOPY ENDOSCOPY: SHX5814

## 2013-10-12 MED ORDER — SODIUM CHLORIDE 0.9 % IV SOLN: 500.0000 mL | INTRAVENOUS | Status: DC

## 2013-10-12 NOTE — Patient Instructions (Signed)
YOU HAD AN ENDOSCOPIC PROCEDURE TODAY AT THE [facility]: Refer to the procedure report that was given to you for any specific questions about what was found during the examination.  If the procedure report does not answer your questions, please call your gastroenterologist to clarify.  If you requested that your care partner not be given the details of your procedure findings, then the procedure report has been included in a sealed envelope for you to review at your convenience later.  YOU SHOULD EXPECT: Some feelings of bloating in the abdomen. Passage of more gas than usual.  Walking can help get rid of the air that was put into your GI tract during the procedure and reduce the bloating. If you had a lower endoscopy (such as a colonoscopy or flexible sigmoidoscopy) you may notice spotting of blood in your stool or on the toilet paper. If you underwent a bowel prep for your procedure, then you may not have a normal bowel movement for a few days.  DIET: Your first meal following the procedure should be a light meal and then it is ok to progress to your normal diet.  A half-sandwich or bowl of soup is an example of a good first meal.  Heavy or fried foods are harder to digest and may make you feel nauseous or bloated.  Likewise meals heavy in dairy and vegetables can cause extra gas to form and this can also increase the bloating.  Drink plenty of fluids but you should avoid alcoholic beverages for 24 hours.  ACTIVITY: Your care partner should take you home directly after the procedure.  You should plan to take it easy, moving slowly for the rest of the day.  You can resume normal activity the day after the procedure however you should NOT DRIVE or use heavy machinery for 24 hours (because of the sedation medicines used during the test).    SYMPTOMS TO REPORT IMMEDIATELY: A gastroenterologist can be reached at any hour.  During normal business hours, 8:30 AM to 5:00 PM Monday through Friday,  call [phone].  After hours and on weekends, please call the GI answering service at [phone] who will take a message and have the physician on call contact you.   Following lower endoscopy (colonoscopy or flexible sigmoidoscopy):  Excessive amounts of blood in the stool  Significant tenderness or worsening of abdominal pains  Swelling of the abdomen that is new, acute  Fever of 100F or higher  Following upper endoscopy (EGD)  Vomiting of blood or coffee ground material  New chest pain or pain under the shoulder blades  Painful or persistently difficult swallowing  New shortness of breath  Fever of 100F or higher  Black, tarry-looking stools  FOLLOW UP: If any biopsies were taken you will be contacted by phone or by letter within the next 1-3 weeks.  Call your gastroenterologist if you have not heard about the biopsies in 3 weeks.  Our staff will call the home number listed on your records the next business day following your procedure to check on you and address any questions or concerns that you may have at that time regarding the information given to you following your procedure. This is a courtesy call and so if there is no answer at the home number and we have not heard from you through the emergency physician on call, we will assume that you have returned to your regular daily activities without incident.  SIGNATURES/CONFIDENTIALITY: You and/or your care   partner have signed paperwork which will be entered into your electronic medical record.  These signatures attest to the fact that that the information above on your After Visit Summary has been reviewed and is understood.  Full responsibility of the confidentiality of this discharge information lies with you and/or your care-partner.  Recommendations Hold NSAIDS Follow up hemoccults in 1 month Consider arthrotec for arthritic pain

## 2013-10-12 NOTE — Progress Notes (Signed)
Called to room to assist during endoscopic procedure.  Patient ID and intended procedure confirmed with present staff. Received instructions for my participation in the procedure from the performing physician.  

## 2013-10-12 NOTE — Op Note (Signed)
[facility]  [address] [place], [id number]   ENDOSCOPY PROCEDURE REPORT  PATIENT: [name], [name]  MR#: [id number] BIRTHDATE: [birth year] , 73  yrs. old GENDER: Male ENDOSCOPIST: Inda Castle, MD REFERRED BY:  Shon Baton, M.D. PROCEDURE DATE:  10/12/2013 PROCEDURE:  EGD w/ biopsy ASA CLASS:     Class II INDICATIONS:  Occult blood positive. MEDICATIONS: MAC sedation, administered by CRNA, propofol (Diprivan) 100mg  IV, and Simethicone 0.6cc PO TOPICAL ANESTHETIC: Cetacaine Spray  DESCRIPTION OF PROCEDURE: After the risks benefits and alternatives of the procedure were thoroughly explained, informed consent was obtained.  The LB [id number] endoscope was introduced through the mouth and advanced to the third portion of the duodenum. Without limitations.  The instrument was slowly withdrawn as the mucosa was fully examined.      In the gastric antrum there were multiple areas of subepithelial hemorrhagic mucosa.  Biopsies were taken.  It was moderate edema in the gastric body and cardia.   The remainder of the upper endoscopy exam was otherwise normal.  Retroflexed views revealed no abnormalities.     The scope was then withdrawn from the patient and the procedure completed.  COMPLICATIONS: There were no complications. ENDOSCOPIC IMPRESSION: 1.   In the gastric antrum there were multiple areas of subepithelial hemorrhagic mucosa.  Biopsies were taken.  It was moderate edema in the gastric body and cardia - NSAID-induced gastritis 2.   The remainder of the upper endoscopy exam was otherwise normal  RECOMMENDATIONS: hold NSAIDs Followup Hemoccults in one month To consider arthrotec for  arthritic pain REPEAT EXAM:  eSigned:  Inda Castle, MD 10/12/2013 11:11 AM   CC:

## 2013-10-12 NOTE — Progress Notes (Signed)
Report to pacu rn, vss, bbs=clear 

## 2013-10-13 ENCOUNTER — Telehealth: Payer: Self-pay

## 2013-10-13 NOTE — Telephone Encounter (Signed)
  Follow up Call-  Call back number 10/12/2013  Post procedure Call Back phone  # [phone]  Permission to leave phone message Yes     Patient questions:  Do you have a fever, pain , or abdominal swelling? no Pain Score  0 *  Have you tolerated food without any problems? yes  Have you been able to return to your normal activities? yes  Do you have any questions about your discharge instructions: Diet   no Medications  no Follow up visit  no  Do you have questions or concerns about your Care? no  Actions: * If pain score is 4 or above: No action needed, pain <4.  No problems per the pt. maw

## 2013-10-15 ENCOUNTER — Other Ambulatory Visit: Payer: Self-pay

## 2013-10-15 DIAGNOSIS — D649 Anemia, unspecified: Secondary | ICD-10-CM

## 2013-10-18 DIAGNOSIS — L819 Disorder of pigmentation, unspecified: Secondary | ICD-10-CM | POA: Diagnosis not present

## 2013-10-18 DIAGNOSIS — L570 Actinic keratosis: Secondary | ICD-10-CM | POA: Diagnosis not present

## 2013-10-18 DIAGNOSIS — Z85820 Personal history of malignant melanoma of skin: Secondary | ICD-10-CM | POA: Diagnosis not present

## 2013-10-18 DIAGNOSIS — D235 Other benign neoplasm of skin of trunk: Secondary | ICD-10-CM | POA: Diagnosis not present

## 2013-10-22 ENCOUNTER — Encounter: Payer: Self-pay | Admitting: Gastroenterology

## 2013-10-25 ENCOUNTER — Encounter: Payer: Self-pay | Admitting: Vascular Surgery

## 2013-10-26 ENCOUNTER — Ambulatory Visit (INDEPENDENT_AMBULATORY_CARE_PROVIDER_SITE_OTHER): Payer: Self-pay | Admitting: Vascular Surgery

## 2013-10-26 ENCOUNTER — Encounter: Payer: Self-pay | Admitting: Vascular Surgery

## 2013-10-26 VITALS — BP 93/59 | HR 102 | Resp 18 | Ht 70.0 in | Wt 215.5 lb

## 2013-10-26 DIAGNOSIS — I6529 Occlusion and stenosis of unspecified carotid artery: Secondary | ICD-10-CM

## 2013-10-26 NOTE — Progress Notes (Signed)
Here today for followup of his right carotid endarterectomy for severe asymptomatic right carotid stenosis. He did well Hospital discharged to home on postoperative day 1. He reports that he had no difficulty following the procedure. He did have difficulty sleeping immediately following the procedure but is returned to his baseline sleeping pattern. No significant pain and reports no use of anything other than over-the-counter pain medication. I has had no neurologic deficit  Physical exam his right neck incision is well healed. . He has no bruits bilaterally and is grossly intact neurologically  Impression and plan stable status post right carotid endarterectomy for severe asymptomatic disease on 10/01/2013. We'll continues usual activities. We'll see him again in 6 months with repeat carotid duplex

## 2013-11-12 NOTE — OR Nursing (Signed)
Addendum to scope.

## 2013-11-18 ENCOUNTER — Telehealth: Payer: Self-pay | Admitting: *Deleted

## 2013-11-18 ENCOUNTER — Other Ambulatory Visit (INDEPENDENT_AMBULATORY_CARE_PROVIDER_SITE_OTHER): Payer: Medicare Other

## 2013-11-18 DIAGNOSIS — D649 Anemia, unspecified: Secondary | ICD-10-CM | POA: Diagnosis not present

## 2013-11-18 LAB — HEMOCCULT SLIDES (X 3 CARDS)

## 2013-11-18 NOTE — Telephone Encounter (Signed)
The patient called me back and apologized for doing the stool cards incorrectly.  He said he had another set at home and  I told him if he wants to call me when he gets home I can go over it with him once more.  He thanked me and said he would call me if he needs to.  I told him to be sure he does the card x 3 with 3 different bowel movements.

## 2013-11-18 NOTE — Telephone Encounter (Signed)
We got a call from our lab advising that this patient brought in his stool card and the lab told me he has to redo them.  He put the stool sample on the wrong side of the cards.  I LM for the patient asking him to come to our office and ask for me. I advised I will give him another set of the cards and explain where to put the stool sample.

## 2013-12-27 DIAGNOSIS — Z85819 Personal history of malignant neoplasm of unspecified site of lip, oral cavity, and pharynx: Secondary | ICD-10-CM | POA: Diagnosis not present

## 2013-12-27 DIAGNOSIS — K219 Gastro-esophageal reflux disease without esophagitis: Secondary | ICD-10-CM | POA: Diagnosis not present

## 2013-12-27 DIAGNOSIS — J310 Chronic rhinitis: Secondary | ICD-10-CM | POA: Diagnosis not present

## 2013-12-27 DIAGNOSIS — K117 Disturbances of salivary secretion: Secondary | ICD-10-CM | POA: Diagnosis not present

## 2014-01-12 DIAGNOSIS — Z5189 Encounter for other specified aftercare: Secondary | ICD-10-CM | POA: Diagnosis not present

## 2014-01-12 DIAGNOSIS — M25819 Other specified joint disorders, unspecified shoulder: Secondary | ICD-10-CM | POA: Diagnosis not present

## 2014-02-08 DIAGNOSIS — I10 Essential (primary) hypertension: Secondary | ICD-10-CM | POA: Diagnosis not present

## 2014-02-08 DIAGNOSIS — E785 Hyperlipidemia, unspecified: Secondary | ICD-10-CM | POA: Diagnosis not present

## 2014-02-08 DIAGNOSIS — Z23 Encounter for immunization: Secondary | ICD-10-CM | POA: Diagnosis not present

## 2014-02-08 DIAGNOSIS — C069 Malignant neoplasm of mouth, unspecified: Secondary | ICD-10-CM | POA: Diagnosis not present

## 2014-02-08 DIAGNOSIS — Z85820 Personal history of malignant melanoma of skin: Secondary | ICD-10-CM | POA: Diagnosis not present

## 2014-02-08 DIAGNOSIS — I6529 Occlusion and stenosis of unspecified carotid artery: Secondary | ICD-10-CM | POA: Diagnosis not present

## 2014-02-08 DIAGNOSIS — Z1331 Encounter for screening for depression: Secondary | ICD-10-CM | POA: Diagnosis not present

## 2014-02-08 DIAGNOSIS — E669 Obesity, unspecified: Secondary | ICD-10-CM | POA: Diagnosis not present

## 2014-02-08 DIAGNOSIS — F17200 Nicotine dependence, unspecified, uncomplicated: Secondary | ICD-10-CM | POA: Diagnosis not present

## 2014-02-08 DIAGNOSIS — E291 Testicular hypofunction: Secondary | ICD-10-CM | POA: Diagnosis not present

## 2014-02-09 DIAGNOSIS — L570 Actinic keratosis: Secondary | ICD-10-CM | POA: Diagnosis not present

## 2014-02-09 DIAGNOSIS — L821 Other seborrheic keratosis: Secondary | ICD-10-CM | POA: Diagnosis not present

## 2014-03-01 ENCOUNTER — Encounter: Payer: Self-pay | Admitting: Gastroenterology

## 2014-03-09 DIAGNOSIS — Z85819 Personal history of malignant neoplasm of unspecified site of lip, oral cavity, and pharynx: Secondary | ICD-10-CM | POA: Diagnosis not present

## 2014-03-09 DIAGNOSIS — K117 Disturbances of salivary secretion: Secondary | ICD-10-CM | POA: Diagnosis not present

## 2014-03-09 DIAGNOSIS — J310 Chronic rhinitis: Secondary | ICD-10-CM | POA: Diagnosis not present

## 2014-03-28 DIAGNOSIS — R0989 Other specified symptoms and signs involving the circulatory and respiratory systems: Secondary | ICD-10-CM | POA: Diagnosis not present

## 2014-03-28 DIAGNOSIS — Z6832 Body mass index (BMI) 32.0-32.9, adult: Secondary | ICD-10-CM | POA: Diagnosis not present

## 2014-04-18 DIAGNOSIS — D225 Melanocytic nevi of trunk: Secondary | ICD-10-CM | POA: Diagnosis not present

## 2014-04-18 DIAGNOSIS — Z08 Encounter for follow-up examination after completed treatment for malignant neoplasm: Secondary | ICD-10-CM | POA: Diagnosis not present

## 2014-04-18 DIAGNOSIS — Z85820 Personal history of malignant melanoma of skin: Secondary | ICD-10-CM | POA: Diagnosis not present

## 2014-04-18 DIAGNOSIS — L814 Other melanin hyperpigmentation: Secondary | ICD-10-CM | POA: Diagnosis not present

## 2014-04-25 ENCOUNTER — Encounter: Payer: Self-pay | Admitting: Vascular Surgery

## 2014-04-26 ENCOUNTER — Encounter: Payer: Self-pay | Admitting: Family

## 2014-04-26 ENCOUNTER — Ambulatory Visit (INDEPENDENT_AMBULATORY_CARE_PROVIDER_SITE_OTHER): Payer: Medicare Other | Admitting: Family

## 2014-04-26 ENCOUNTER — Ambulatory Visit (HOSPITAL_COMMUNITY)
Admission: RE | Admit: 2014-04-26 | Discharge: 2014-04-26 | Disposition: A | Discharge status: Home / Self Care | Payer: Medicare Other | Source: Ambulatory Visit | Attending: Vascular Surgery | Admitting: Vascular Surgery

## 2014-04-26 VITALS — BP 92/65 | HR 99 | Resp 16 | Ht 70.0 in | Wt 219.0 lb

## 2014-04-26 DIAGNOSIS — Z9889 Other specified postprocedural states: Secondary | ICD-10-CM | POA: Diagnosis not present

## 2014-04-26 DIAGNOSIS — I6529 Occlusion and stenosis of unspecified carotid artery: Secondary | ICD-10-CM

## 2014-04-26 DIAGNOSIS — Z48812 Encounter for surgical aftercare following surgery on the circulatory system: Secondary | ICD-10-CM

## 2014-04-26 DIAGNOSIS — I6522 Occlusion and stenosis of left carotid artery: Secondary | ICD-10-CM | POA: Diagnosis not present

## 2014-04-26 DIAGNOSIS — I6523 Occlusion and stenosis of bilateral carotid arteries: Secondary | ICD-10-CM | POA: Insufficient documentation

## 2014-04-26 NOTE — Patient Instructions (Addendum)
Stroke Prevention Some medical conditions and behaviors are associated with an increased chance of having a stroke. You may prevent a stroke by making healthy choices and managing medical conditions. HOW CAN I REDUCE MY RISK OF HAVING A STROKE?   Stay physically active. Get at least 30 minutes of activity on most or all days.  Do not smoke. It may also be helpful to avoid exposure to secondhand smoke.  Limit alcohol use. Moderate alcohol use is considered to be:  No more than 2 drinks per day for men.  No more than 1 drink per day for nonpregnant women.  Eat healthy foods. This involves:  Eating 5 or more servings of fruits and vegetables a day.  Making dietary changes that address high blood pressure (hypertension), high cholesterol, diabetes, or obesity.  Manage your cholesterol levels.  Making food choices that are high in fiber and low in saturated fat, trans fat, and cholesterol may control cholesterol levels.  Take any prescribed medicines to control cholesterol as directed by your health care provider.  Manage your diabetes.  Controlling your carbohydrate and sugar intake is recommended to manage diabetes.  Take any prescribed medicines to control diabetes as directed by your health care provider.  Control your hypertension.  Making food choices that are low in salt (sodium), saturated fat, trans fat, and cholesterol is recommended to manage hypertension.  Take any prescribed medicines to control hypertension as directed by your health care provider.  Maintain a healthy weight.  Reducing calorie intake and making food choices that are low in sodium, saturated fat, trans fat, and cholesterol are recommended to manage weight.  Stop drug abuse.  Avoid taking birth control pills.  Talk to your health care provider about the risks of taking birth control pills if you are over 35 years old, smoke, get migraines, or have ever had a blood clot.  Get evaluated for sleep  disorders (sleep apnea).  Talk to your health care provider about getting a sleep evaluation if you snore a lot or have excessive sleepiness.  Take medicines only as directed by your health care provider.  For some people, aspirin or blood thinners (anticoagulants) are helpful in reducing the risk of forming abnormal blood clots that can lead to stroke. If you have the irregular heart rhythm of atrial fibrillation, you should be on a blood thinner unless there is a good reason you cannot take them.  Understand all your medicine instructions.  Make sure that other conditions (such as anemia or atherosclerosis) are addressed. SEEK IMMEDIATE MEDICAL CARE IF:   You have sudden weakness or numbness of the face, arm, or leg, especially on one side of the body.  Your face or eyelid droops to one side.  You have sudden confusion.  You have trouble speaking (aphasia) or understanding.  You have sudden trouble seeing in one or both eyes.  You have sudden trouble walking.  You have dizziness.  You have a loss of balance or coordination.  You have a sudden, severe headache with no known cause.  You have new chest pain or an irregular heartbeat. Any of these symptoms may represent a serious problem that is an emergency. Do not wait to see if the symptoms will go away. Get medical help at once. Call your local emergency services (911 in [place] Do not drive yourself to the hospital. Document Released: 06/13/2004 Document Revised: 09/20/2013 Document Reviewed: 11/06/2012 ExitCare Patient Information 2015 [employer]. This information is not intended to replace advice given   to you by your health care provider. Make sure you discuss any questions you have with your health care provider.    Smoking Cessation Quitting smoking is important to your health and has many advantages. However, it is not always easy to quit since nicotine is a very addictive drug. Oftentimes, people try 3 times or  more before being able to quit. This document explains the best ways for you to prepare to quit smoking. Quitting takes hard work and a lot of effort, but you can do it. ADVANTAGES OF QUITTING SMOKING  You will live longer, feel better, and live better.  Your body will feel the impact of quitting smoking almost immediately.  Within 20 minutes, blood pressure decreases. Your pulse returns to its normal level.  After 8 hours, carbon monoxide levels in the blood return to normal. Your oxygen level increases.  After 24 hours, the chance of having a heart attack starts to decrease. Your breath, hair, and body stop smelling like smoke.  After 48 hours, damaged nerve endings begin to recover. Your sense of taste and smell improve.  After 72 hours, the body is virtually free of nicotine. Your bronchial tubes relax and breathing becomes easier.  After 2 to 12 weeks, lungs can hold more air. Exercise becomes easier and circulation improves.  The risk of having a heart attack, stroke, cancer, or lung disease is greatly reduced.  After 1 year, the risk of coronary heart disease is cut in half.  After 5 years, the risk of stroke falls to the same as a nonsmoker.  After 10 years, the risk of lung cancer is cut in half and the risk of other cancers decreases significantly.  After 15 years, the risk of coronary heart disease drops, usually to the level of a nonsmoker.  If you are pregnant, quitting smoking will improve your chances of having a healthy baby.  The people you live with, especially any children, will be healthier.  You will have extra money to spend on things other than cigarettes. QUESTIONS TO THINK ABOUT BEFORE ATTEMPTING TO QUIT You may want to talk about your answers with your health care provider.  Why do you want to quit?  If you tried to quit in the past, what helped and what did not?  What will be the most difficult situations for you after you quit? How will you plan to  handle them?  Who can help you through the tough times? Your family? Friends? A health care provider?  What pleasures do you get from smoking? What ways can you still get pleasure if you quit? Here are some questions to ask your health care provider:  How can you help me to be successful at quitting?  What medicine do you think would be best for me and how should I take it?  What should I do if I need more help?  What is smoking withdrawal like? How can I get information on withdrawal? GET READY  Set a quit date.  Change your environment by getting rid of all cigarettes, ashtrays, matches, and lighters in your home, car, or work. Do not let people smoke in your home.  Review your past attempts to quit. Think about what worked and what did not. GET SUPPORT AND ENCOURAGEMENT You have a better chance of being successful if you have help. You can get support in many ways.  Tell your family, friends, and coworkers that you are going to quit and need their support. Ask them   not to smoke around you.  Get individual, group, or telephone counseling and support. Programs are available at local hospitals and health centers. Call your local health department for information about programs in your area.  Spiritual beliefs and practices may help some smokers quit.  Download a "quit meter" on your computer to keep track of quit statistics, such as how long you have gone without smoking, cigarettes not smoked, and money saved.  Get a self-help book about quitting smoking and staying off tobacco. LEARN NEW SKILLS AND BEHAVIORS  Distract yourself from urges to smoke. Talk to someone, go for a walk, or occupy your time with a task.  Change your normal routine. Take a different route to work. Drink tea instead of coffee. Eat breakfast in a different place.  Reduce your stress. Take a hot bath, exercise, or read a book.  Plan something enjoyable to do every day. Reward yourself for not  smoking.  Explore interactive web-based programs that specialize in helping you quit. GET MEDICINE AND USE IT CORRECTLY Medicines can help you stop smoking and decrease the urge to smoke. Combining medicine with the above behavioral methods and support can greatly increase your chances of successfully quitting smoking.  Nicotine replacement therapy helps deliver nicotine to your body without the negative effects and risks of smoking. Nicotine replacement therapy includes nicotine gum, lozenges, inhalers, nasal sprays, and skin patches. Some may be available over-the-counter and others require a prescription.  Antidepressant medicine helps people abstain from smoking, but how this works is unknown. This medicine is available by prescription.  Nicotinic receptor partial agonist medicine simulates the effect of nicotine in your brain. This medicine is available by prescription. Ask your health care provider for advice about which medicines to use and how to use them based on your health history. Your health care provider will tell you what side effects to look out for if you choose to be on a medicine or therapy. Carefully read the information on the package. Do not use any other product containing nicotine while using a nicotine replacement product.  RELAPSE OR DIFFICULT SITUATIONS Most relapses occur within the first 3 months after quitting. Do not be discouraged if you start smoking again. Remember, most people try several times before finally quitting. You may have symptoms of withdrawal because your body is used to nicotine. You may crave cigarettes, be irritable, feel very hungry, cough often, get headaches, or have difficulty concentrating. The withdrawal symptoms are only temporary. They are strongest when you first quit, but they will go away within 10-14 days. To reduce the chances of relapse, try to:  Avoid drinking alcohol. Drinking lowers your chances of successfully quitting.  Reduce the  amount of caffeine you consume. Once you quit smoking, the amount of caffeine in your body increases and can give you symptoms, such as a rapid heartbeat, sweating, and anxiety.  Avoid smokers because they can make you want to smoke.  Do not let weight gain distract you. Many smokers will gain weight when they quit, usually less than 10 pounds. Eat a healthy diet and stay active. You can always lose the weight gained after you quit.  Find ways to improve your mood other than smoking. FOR MORE INFORMATION  www.smokefree.gov  Document Released: 04/30/2001 Document Revised: 09/20/2013 Document Reviewed: 08/15/2011 ExitCare Patient Information 2015 [employer]. This information is not intended to replace advice given to you by your health care provider. Make sure you discuss any questions you have with your health   care provider.    Smoking Cessation, Tips for Success If you are ready to quit smoking, congratulations! You have chosen to help yourself be healthier. Cigarettes bring nicotine, tar, carbon monoxide, and other irritants into your body. Your lungs, heart, and blood vessels will be able to work better without these poisons. There are many different ways to quit smoking. Nicotine gum, nicotine patches, a nicotine inhaler, or nicotine nasal spray can help with physical craving. Hypnosis, support groups, and medicines help break the habit of smoking. WHAT THINGS CAN I DO TO MAKE QUITTING EASIER?  Here are some tips to help you quit for good:  Pick a date when you will quit smoking completely. Tell all of your friends and family about your plan to quit on that date.  Do not try to slowly cut down on the number of cigarettes you are smoking. Pick a quit date and quit smoking completely starting on that day.  Throw away all cigarettes.   Clean and remove all ashtrays from your home, work, and car.  On a card, write down your reasons for quitting. Carry the card with you and read it  when you get the urge to smoke.  Cleanse your body of nicotine. Drink enough water and fluids to keep your urine clear or pale yellow. Do this after quitting to flush the nicotine from your body.  Learn to predict your moods. Do not let a bad situation be your excuse to have a cigarette. Some situations in your life might tempt you into wanting a cigarette.  Never have "just one" cigarette. It leads to wanting another and another. Remind yourself of your decision to quit.  Change habits associated with smoking. If you smoked while driving or when feeling stressed, try other activities to replace smoking. Stand up when drinking your coffee. Brush your teeth after eating. Sit in a different chair when you read the paper. Avoid alcohol while trying to quit, and try to drink fewer caffeinated beverages. Alcohol and caffeine may urge you to smoke.  Avoid foods and drinks that can trigger a desire to smoke, such as sugary or spicy foods and alcohol.  Ask people who smoke not to smoke around you.  Have something planned to do right after eating or having a cup of coffee. For example, plan to take a walk or exercise.  Try a relaxation exercise to calm you down and decrease your stress. Remember, you may be tense and nervous for the first 2 weeks after you quit, but this will pass.  Find new activities to keep your hands busy. Play with a pen, coin, or rubber band. Doodle or draw things on paper.  Brush your teeth right after eating. This will help cut down on the craving for the taste of tobacco after meals. You can also try mouthwash.   Use oral substitutes in place of cigarettes. Try using lemon drops, carrots, cinnamon sticks, or chewing gum. Keep them handy so they are available when you have the urge to smoke.  When you have the urge to smoke, try deep breathing.  Designate your home as a nonsmoking area.  If you are a heavy smoker, ask your health care provider about a prescription for  nicotine chewing gum. It can ease your withdrawal from nicotine.  Reward yourself. Set aside the cigarette money you save and buy yourself something nice.  Look for support from others. Join a support group or smoking cessation program. Ask someone at home or at work   to help you with your plan to quit smoking.  Always ask yourself, "Do I need this cigarette or is this just a reflex?" Tell yourself, "Today, I choose not to smoke," or "I do not want to smoke." You are reminding yourself of your decision to quit.  Do not replace cigarette smoking with electronic cigarettes (commonly called e-cigarettes). The safety of e-cigarettes is unknown, and some may contain harmful chemicals.  If you relapse, do not give up! Plan ahead and think about what you will do the next time you get the urge to smoke. HOW WILL I FEEL WHEN I QUIT SMOKING? You may have symptoms of withdrawal because your body is used to nicotine (the addictive substance in cigarettes). You may crave cigarettes, be irritable, feel very hungry, cough often, get headaches, or have difficulty concentrating. The withdrawal symptoms are only temporary. They are strongest when you first quit but will go away within 10-14 days. When withdrawal symptoms occur, stay in control. Think about your reasons for quitting. Remind yourself that these are signs that your body is healing and getting used to being without cigarettes. Remember that withdrawal symptoms are easier to treat than the major diseases that smoking can cause.  Even after the withdrawal is over, expect periodic urges to smoke. However, these cravings are generally short lived and will go away whether you smoke or not. Do not smoke! WHAT RESOURCES ARE AVAILABLE TO HELP ME QUIT SMOKING? Your health care provider can direct you to community resources or hospitals for support, which may include:  Group support.  Education.  Hypnosis.  Therapy. Document Released: 02/02/2004 Document  Revised: 09/20/2013 Document Reviewed: 10/22/2012 ExitCare Patient Information 2015 [employer]. This information is not intended to replace advice given to you by your health care provider. Make sure you discuss any questions you have with your health care provider.  

## 2014-04-26 NOTE — Progress Notes (Signed)
Established Carotid Patient   History of Present Illness  [name] is a 73 y.o. male patient of Dr. Donnetta Hutching who is status post right carotid endarterectomy on 10/01/2013 for severe (80% stenosis) asymptomatic disease.  He returns today for follow up.  The patient denies any history of TIA or stroke symptoms, specifically the patient denies a history of amaurosis fugax or monocular blindness, denies a history unilateral  of facial drooping, denies a history of hemiplegia, and denies a history of receptive or expressive aphasia.    The patient states he has "a pinched nerve" in his back and has pain in both of his hips from this, sees [facility] for this. Pt states his right knee is a candidate for replacement and his right shoulder sustained an injury in the past and he receives injections in the shoulder and knee for pain.  He is physically active.  The patient denies New Medical or Surgical History.  Pt Diabetic: No Pt smoker: smoker  (couple of cigars daily, started smoking in his 20's,  states he has no intention of quitting) Pt states he consumes about 14 ETOH drinks/week  Pt meds include: Statin : Yes ASA: Yes Other anticoagulants/antiplatelets: no   Past Medical History  Diagnosis Date  . GERD (gastroesophageal reflux disease)   . Gout     pt denies 10/01/2013  . Hypertension   . Colon polyps   . Sleep apnea     had suegery in 2000 for sleep apnea  . Prostate hypertrophy   . Arthritis   . Skin cancer     Social History History  Substance Use Topics  . Smoking status: Current Every Day Smoker -- 50 years    Types: Cigars  . Smokeless tobacco: Never Used     Comment: pt states he smokes a couple of cigars per day  . Alcohol Use: 12.6 oz/week    7 Glasses of wine, 14 Shots of liquor per week    Family History Family History  Problem Relation Age of Onset  . Diverticulosis Mother   . Skin cancer Mother   . Varicose Veins Mother   .  Hyperlipidemia Father   . Heart disease Father   . Heart attack Father     Surgical History Past Surgical History  Procedure Laterality Date  . Replacement total knee Left   . 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 Removal of melonomia    . Sleep apnea surgery    . Rhinoplasty    . Endarterectomy Right 10/01/2013    Procedure: ENDARTERECTOMY CAROTID-RIGHT;  Surgeon: Rosetta Posner, MD;  Location: [facility];  Service: Vascular;  Laterality: Right;  . Patch angioplasty Right 10/01/2013    Procedure: PATCH ANGIOPLASTY USING HEMASHIELD FINESSE PATCH;  Surgeon: Rosetta Posner, MD;  Location: [facility];  Service: Vascular;  Laterality: Right;  . Esophagogastroduodenoscopy endoscopy  10-12-2013  . Carotid endarterectomy Right Oct 01, 2013    CE    Allergies  Allergen Reactions  . Cefaclor Other (See Comments)     skin split  . Penicillins Rash    Current Outpatient Prescriptions  Medication Sig Dispense Refill  . aspirin 325 MG tablet Take 325 mg by mouth at bedtime.     . Cholecalciferol (VITAMIN D PO) Take 6,000 Units by mouth daily.    . clindamycin (CLEOCIN) 150 MG capsule Take 600 mg by mouth See admin instructions. Take  4 capsules by mouth 1 hour prior to dental appointment    . esomeprazole (NEXIUM) 40 MG capsule Take 40 mg by mouth daily as needed (acid reflux).     . fenofibrate (TRICOR) 145 MG tablet Take 145 mg by mouth daily.    Marland Kitchen losartan-hydrochlorothiazide (HYZAAR) 100-25 MG per tablet Take 1 tablet by mouth daily.    . naproxen sodium (ALEVE) 220 MG tablet Take 440 mg by mouth daily.    . Omega-3 Fatty Acids (FISH OIL) 1200 MG CAPS Take 4,800 mg by mouth at bedtime.    Marland Kitchen oxyCODONE-acetaminophen (PERCOCET/ROXICET) 5-325 MG per tablet Take 1 tablet by mouth every 6 (six) hours as needed for moderate pain. 30 tablet 0  . probenecid (BENEMID) 500 MG tablet Take 500 mg by mouth daily.    . rosuvastatin (CRESTOR) 10 MG  tablet Take 10 mg by mouth daily.    . tadalafil (CIALIS) 5 MG tablet Take 5 mg by mouth daily.    . Testosterone (AXIRON) 30 MG/ACT SOLN Place 180 mg onto the skin See admin instructions. Apply 3 pumps (90 mg) in each armpit every morning     No current facility-administered medications for this visit.    Review of Systems : See HPI for pertinent positives and negatives.  Physical Examination  Filed Vitals:   04/26/14 0940 04/26/14 0943  BP: 109/69 92/65  Pulse: 101 99  Resp:  16  Height:  5\' 10"  (1.778 m)  Weight:  219 lb (99.338 kg)  SpO2:  93%   Body mass index is 31.42 kg/(m^2).  General: WDWN obese male in NAD GAIT: normal Eyes: PERRLA Pulmonary:  Non-labored, CTAB, Negative  Rales, Negative rhonchi, & Negative wheezing.  Cardiac: regular Rhythm,  Negative detected murmur.  VASCULAR EXAM Carotid Bruits Right Left   Negative Negative    Aorta is not palpable. Radial pulses: right is 2+ palpable, left is not palpable but left ulnar pulse is 2+ and left brachial is 2+ palpable.                                                                                                                            LE Pulses Right Left       POPLITEAL  not palpable   not palpable       POSTERIOR TIBIAL  not palpable    palpable        DORSALIS PEDIS      ANTERIOR TIBIAL  palpable   palpable     Gastrointestinal: soft, nontender, BS WNL, no r/g,  negative palpated masses.  Musculoskeletal: Negative muscle atrophy/wasting. M/S 5/5 throughout, extremities without ischemic changes.  Neurologic: A&O X 3; Appropriate Affect, Speech is normal CN 2-12 intact, pain and light touch intact in extremities, motor exam as listed above.   Non-Invasive Vascular Imaging CAROTID DUPLEX 04/26/2014   CEREBROVASCULAR DUPLEX EVALUATION    INDICATION: Carotid artery disease.    PREVIOUS INTERVENTION(S): Right carotid endarterectomy 10/01/2013.  DUPLEX EXAM:     RIGHT  LEFT  Peak  Systolic Velocities (cm/s) End Diastolic Velocities (cm/s) Plaque LOCATION Peak Systolic Velocities (cm/s) End Diastolic Velocities (cm/s) Plaque  116 26  CCA PROXIMAL 85 10   123 28  CCA MID 108 23   94 22  CCA DISTAL 80 18   101 13  ECA 74 12   48 12  ICA PROXIMAL 120              35                                 HT  72 24  ICA MID 160 48 HT  67 22  ICA DISTAL 113 28      ICA / CCA Ratio (PSV) 1.48  Antegrade  Vertebral Flow Antegrade   937 Brachial Systolic Pressure (mmHg) 169  Triphasic  Brachial Artery Waveforms Triphasic     Plaque Morphology:  HM = Homogeneous, HT = Heterogeneous, CP = Calcific Plaque, SP = Smooth Plaque, IP = Irregular Plaque     ADDITIONAL FINDINGS:     IMPRESSION: Patent right carotid endarterectomy site with no evidence of hyperplasia or restenosis.  Left internal carotid artery velocities suggest a 40-59% stenosis.     Compared to the previous exam:  This is the first post-operative exam.      Assessment: [name] is a 73 y.o. male who is status post right carotid endarterectomy on 10/01/2013 for severe (80% stenosis) asymptomatic disease. 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  Unfortunately the patient continues to smoke about 2 cigars daily and states he has no intention of quitting. Fortunately he does not have DM.  Plan: Follow-up in 6 months with Carotid Duplex according to surveillance timeline.   The patient was counseled re smoking cessation and given several free resources re smoking cessation.   I discussed in depth with the patient the nature of atherosclerosis, and emphasized the importance of maximal medical management including strict control of blood pressure, blood glucose, and lipid levels, obtaining regular exercise, and cessation of smoking.  The patient is aware that without maximal medical management the underlying atherosclerotic disease  process will progress, limiting the benefit of any interventions. The patient was given information about stroke prevention and what symptoms should prompt the patient to seek immediate medical care. Thank you for allowing [place] to participate in this patient's care.  Clemon Chambers, RN, MSN, FNP-C [facility] Office: [phone]  Clinic Physician: Early  04/26/2014 9:42 AM

## 2014-04-26 NOTE — Addendum Note (Signed)
Addended by: Dorthula Rue L on: 04/26/2014 02:15 PM   Modules accepted: Orders

## 2014-04-28 DIAGNOSIS — K117 Disturbances of salivary secretion: Secondary | ICD-10-CM | POA: Diagnosis not present

## 2014-04-28 DIAGNOSIS — Z85819 Personal history of malignant neoplasm of unspecified site of lip, oral cavity, and pharynx: Secondary | ICD-10-CM | POA: Diagnosis not present

## 2014-05-03 ENCOUNTER — Other Ambulatory Visit (HOSPITAL_COMMUNITY): Payer: Medicare Other

## 2014-05-03 ENCOUNTER — Ambulatory Visit: Payer: Medicare Other | Admitting: Vascular Surgery

## 2014-05-09 DIAGNOSIS — S8991XA Unspecified injury of right lower leg, initial encounter: Secondary | ICD-10-CM | POA: Diagnosis not present

## 2014-05-09 DIAGNOSIS — M1711 Unilateral primary osteoarthritis, right knee: Secondary | ICD-10-CM | POA: Diagnosis not present

## 2014-05-10 ENCOUNTER — Other Ambulatory Visit (HOSPITAL_COMMUNITY): Payer: Medicare Other

## 2014-05-10 ENCOUNTER — Ambulatory Visit: Payer: Medicare Other | Admitting: Vascular Surgery

## 2014-05-18 DIAGNOSIS — Z9889 Other specified postprocedural states: Secondary | ICD-10-CM | POA: Diagnosis not present

## 2014-05-18 DIAGNOSIS — Z01810 Encounter for preprocedural cardiovascular examination: Secondary | ICD-10-CM | POA: Diagnosis not present

## 2014-05-18 DIAGNOSIS — I6523 Occlusion and stenosis of bilateral carotid arteries: Secondary | ICD-10-CM | POA: Diagnosis not present

## 2014-05-18 DIAGNOSIS — Z136 Encounter for screening for cardiovascular disorders: Secondary | ICD-10-CM | POA: Diagnosis not present

## 2014-05-26 DIAGNOSIS — M1711 Unilateral primary osteoarthritis, right knee: Secondary | ICD-10-CM | POA: Diagnosis not present

## 2014-06-03 DIAGNOSIS — Z01810 Encounter for preprocedural cardiovascular examination: Secondary | ICD-10-CM | POA: Diagnosis not present

## 2014-06-03 DIAGNOSIS — I10 Essential (primary) hypertension: Secondary | ICD-10-CM | POA: Diagnosis not present

## 2014-06-03 DIAGNOSIS — E782 Mixed hyperlipidemia: Secondary | ICD-10-CM | POA: Diagnosis not present

## 2014-06-17 DIAGNOSIS — I6523 Occlusion and stenosis of bilateral carotid arteries: Secondary | ICD-10-CM | POA: Diagnosis not present

## 2014-06-17 DIAGNOSIS — Z136 Encounter for screening for cardiovascular disorders: Secondary | ICD-10-CM | POA: Diagnosis not present

## 2014-06-17 DIAGNOSIS — E782 Mixed hyperlipidemia: Secondary | ICD-10-CM | POA: Diagnosis not present

## 2014-06-29 DIAGNOSIS — M1711 Unilateral primary osteoarthritis, right knee: Secondary | ICD-10-CM | POA: Diagnosis not present

## 2014-06-29 DIAGNOSIS — Z85819 Personal history of malignant neoplasm of unspecified site of lip, oral cavity, and pharynx: Secondary | ICD-10-CM | POA: Diagnosis not present

## 2014-06-29 DIAGNOSIS — K117 Disturbances of salivary secretion: Secondary | ICD-10-CM | POA: Diagnosis not present

## 2014-07-05 DIAGNOSIS — E291 Testicular hypofunction: Secondary | ICD-10-CM | POA: Diagnosis not present

## 2014-07-13 DIAGNOSIS — R350 Frequency of micturition: Secondary | ICD-10-CM | POA: Diagnosis not present

## 2014-07-13 DIAGNOSIS — E291 Testicular hypofunction: Secondary | ICD-10-CM | POA: Diagnosis not present

## 2014-07-13 DIAGNOSIS — N500 Atrophy of testis: Secondary | ICD-10-CM | POA: Diagnosis not present

## 2014-07-13 DIAGNOSIS — N401 Enlarged prostate with lower urinary tract symptoms: Secondary | ICD-10-CM | POA: Diagnosis not present

## 2014-07-18 ENCOUNTER — Telehealth: Payer: Self-pay | Admitting: Gastroenterology

## 2014-07-18 NOTE — Telephone Encounter (Signed)
Patient was visiting his son in [place]. He had his normal bowel movement. No straining. No pain. He noted bright red blood on the bowel movement. He has not seen any blood since. He has no other symptoms. He is going out of country soon on a scuba dive. He wants evaluation and reassurance prior to this trip. Appointment made.

## 2014-07-20 ENCOUNTER — Encounter: Payer: Self-pay | Admitting: Gastroenterology

## 2014-07-20 ENCOUNTER — Ambulatory Visit (INDEPENDENT_AMBULATORY_CARE_PROVIDER_SITE_OTHER): Payer: Medicare Other | Admitting: Gastroenterology

## 2014-07-20 VITALS — BP 134/84 | HR 90 | Ht 70.0 in | Wt 224.4 lb

## 2014-07-20 DIAGNOSIS — K625 Hemorrhage of anus and rectum: Secondary | ICD-10-CM | POA: Diagnosis not present

## 2014-07-20 DIAGNOSIS — Z86010 Personal history of colonic polyps: Secondary | ICD-10-CM

## 2014-07-20 MED ORDER — HYDROCORTISONE 2.5 % RE CREA: 1.0000 "application " | TOPICAL_CREAM | Freq: Two times a day (BID) | RECTAL | Status: DC

## 2014-07-20 NOTE — Progress Notes (Signed)
Reviewed and agree with management. Chealsea Paske D. Davontae Prusinski, M.D., FACG  

## 2014-07-20 NOTE — Patient Instructions (Signed)
Your prescription has been sent to your pharmacy  It has been recommended to you by your physician that you have a(n) Colonoscopy completed in May. Per your request, we did not schedule the procedure(s) today. Please contact our office at [phone] should you decide to have the procedure completed.  When you call back , you will be scheduled to see a Pre-visit nurse to sign paperwork and get your instructions

## 2014-07-20 NOTE — Progress Notes (Signed)
     07/20/2014 ZIYAD DYAR [id number] 06-07-1940   History of Present Illness:  This is a pleasant 74 year old male who is known to Dr. Deatra Ina.  He had a colonoscopy in 09/2009 at which time he had one tubular adenoma removed.  Also had diverticulosis.  Repeat colonoscopy recommended in 5 years from that time.  He presents to our office today after an episode of rectal bleeding.  He says that normal his BM's are very regular, twice a day without straining.  This past Saturday morning, he had to strain to move his bowels, however, and noticed some bright red blood in the toilet bowl.  He had another BM later that day without blood and then two BM's a day since that time without any further evidence of bleeding.  Denies any other complaints.  He was concerned because he is leaving for an out of country trip in a couple of weeks and wanted to be sure that everything is ok before he leaves.   Current Medications, Allergies, Past Medical History, Past Surgical History, Family History and Social History were reviewed in [employer] record.   Physical Exam: BP 134/84 mmHg  Pulse 90  Ht 5\' 10"  (1.778 m)  Wt 224 lb 6.4 oz (101.787 kg)  BMI 32.20 kg/m2 General: Well developed white male in no acute distress Head: Normocephalic and atraumatic Eyes:  Sclerae anicteric, conjunctiva pink  Ears: Normal auditory acuity Abdomen: Soft, non-distended.  Normal bowel sounds.  Non-tender. Rectal:  No external hemorrhoids noted.  DRE did not reveal any masses.  Brown stool noted on exam glove that was heme negative. Musculoskeletal: Symmetrical with no gross deformities  Extremities: No edema  Neurological: Alert oriented x 4, grossly non-focal Psychological:  Alert and cooperative. Normal mood and affect  Assessment and Recommendations: -Rectal bleeding:  One self-limiting episode after straining to have a BM.  Most likely secondary to some internal hemorrhoids.  Will give  hydrocortisone cream to use BID internally if needed; he is going on a trip out of the country and would like something to use just in case bleeding recurs. -Personal history of colon polyps:  Due for colonoscopy in May.  Will work on getting this scheduled for him.  The risks, benefits, and alternatives were discussed with the patient and he consents to proceed.   CC:  Dr. Shon Baton

## 2014-07-28 DIAGNOSIS — Z85819 Personal history of malignant neoplasm of unspecified site of lip, oral cavity, and pharynx: Secondary | ICD-10-CM | POA: Diagnosis not present

## 2014-07-28 DIAGNOSIS — Z0000 Encounter for general adult medical examination without abnormal findings: Secondary | ICD-10-CM | POA: Diagnosis not present

## 2014-08-02 DIAGNOSIS — M1711 Unilateral primary osteoarthritis, right knee: Secondary | ICD-10-CM | POA: Diagnosis not present

## 2014-08-02 DIAGNOSIS — M7541 Impingement syndrome of right shoulder: Secondary | ICD-10-CM | POA: Diagnosis not present

## 2014-08-15 DIAGNOSIS — Z008 Encounter for other general examination: Secondary | ICD-10-CM | POA: Diagnosis not present

## 2014-08-15 DIAGNOSIS — E291 Testicular hypofunction: Secondary | ICD-10-CM | POA: Diagnosis not present

## 2014-08-15 DIAGNOSIS — E785 Hyperlipidemia, unspecified: Secondary | ICD-10-CM | POA: Diagnosis not present

## 2014-08-15 DIAGNOSIS — I10 Essential (primary) hypertension: Secondary | ICD-10-CM | POA: Diagnosis not present

## 2014-08-15 DIAGNOSIS — Z125 Encounter for screening for malignant neoplasm of prostate: Secondary | ICD-10-CM | POA: Diagnosis not present

## 2014-08-15 DIAGNOSIS — E559 Vitamin D deficiency, unspecified: Secondary | ICD-10-CM | POA: Diagnosis not present

## 2014-08-15 DIAGNOSIS — M109 Gout, unspecified: Secondary | ICD-10-CM | POA: Diagnosis not present

## 2014-08-16 DIAGNOSIS — H02403 Unspecified ptosis of bilateral eyelids: Secondary | ICD-10-CM | POA: Diagnosis not present

## 2014-08-16 DIAGNOSIS — H2513 Age-related nuclear cataract, bilateral: Secondary | ICD-10-CM | POA: Diagnosis not present

## 2014-08-18 DIAGNOSIS — E291 Testicular hypofunction: Secondary | ICD-10-CM | POA: Diagnosis not present

## 2014-08-22 DIAGNOSIS — Z0000 Encounter for general adult medical examination without abnormal findings: Secondary | ICD-10-CM | POA: Diagnosis not present

## 2014-08-22 DIAGNOSIS — R739 Hyperglycemia, unspecified: Secondary | ICD-10-CM | POA: Insufficient documentation

## 2014-08-22 DIAGNOSIS — E559 Vitamin D deficiency, unspecified: Secondary | ICD-10-CM | POA: Diagnosis not present

## 2014-08-22 DIAGNOSIS — Z23 Encounter for immunization: Secondary | ICD-10-CM | POA: Diagnosis not present

## 2014-08-22 DIAGNOSIS — R0989 Other specified symptoms and signs involving the circulatory and respiratory systems: Secondary | ICD-10-CM | POA: Diagnosis not present

## 2014-08-22 DIAGNOSIS — I6529 Occlusion and stenosis of unspecified carotid artery: Secondary | ICD-10-CM | POA: Diagnosis not present

## 2014-08-22 DIAGNOSIS — E291 Testicular hypofunction: Secondary | ICD-10-CM | POA: Diagnosis not present

## 2014-08-22 DIAGNOSIS — Z1212 Encounter for screening for malignant neoplasm of rectum: Secondary | ICD-10-CM | POA: Diagnosis not present

## 2014-08-22 DIAGNOSIS — F17210 Nicotine dependence, cigarettes, uncomplicated: Secondary | ICD-10-CM | POA: Diagnosis not present

## 2014-08-22 DIAGNOSIS — Z6832 Body mass index (BMI) 32.0-32.9, adult: Secondary | ICD-10-CM | POA: Diagnosis not present

## 2014-08-22 DIAGNOSIS — K625 Hemorrhage of anus and rectum: Secondary | ICD-10-CM | POA: Diagnosis not present

## 2014-08-22 DIAGNOSIS — M8580 Other specified disorders of bone density and structure, unspecified site: Secondary | ICD-10-CM | POA: Diagnosis not present

## 2014-08-22 DIAGNOSIS — Z01818 Encounter for other preprocedural examination: Secondary | ICD-10-CM | POA: Diagnosis not present

## 2014-08-22 DIAGNOSIS — I10 Essential (primary) hypertension: Secondary | ICD-10-CM | POA: Diagnosis not present

## 2014-08-23 ENCOUNTER — Other Ambulatory Visit (HOSPITAL_COMMUNITY): Payer: Self-pay | Admitting: *Deleted

## 2014-08-23 NOTE — H&P (Signed)
[name] is an 74 y.o. male.    Chief Complaint: right knee pain  HPI: Pt is a 74 y.o. male complaining of right knee pain for multiple years. Pain had continually increased since the beginning. X-rays in the clinic show end-stage arthritic changes of the right knee. Pt has tried various conservative treatments which have failed to alleviate their symptoms, including injections and therapy. Various options are discussed with the patient. Risks, benefits and expectations were discussed with the patient. Patient understand the risks, benefits and expectations and wishes to proceed with surgery.   PCP:  Precious Reel, MD  D/C Plans:  Home with HHPT  PMH: Past Medical History  Diagnosis Date  . GERD (gastroesophageal reflux disease)   . Gout     pt denies 10/01/2013  . Hypertension   . Colon polyps   . Sleep apnea     had suegery in 2000 for sleep apnea  . Prostate hypertrophy   . Arthritis   . Skin cancer     PSH: Past Surgical History  Procedure Laterality Date  . Replacement total knee Left   . 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 Removal of melonomia    . Sleep apnea surgery    . Rhinoplasty    . Endarterectomy Right 10/01/2013    Procedure: ENDARTERECTOMY CAROTID-RIGHT;  Surgeon: Rosetta Posner, MD;  Location: [facility];  Service: Vascular;  Laterality: Right;  . Patch angioplasty Right 10/01/2013    Procedure: PATCH ANGIOPLASTY USING HEMASHIELD FINESSE PATCH;  Surgeon: Rosetta Posner, MD;  Location: [facility];  Service: Vascular;  Laterality: Right;  . Esophagogastroduodenoscopy endoscopy  10-12-2013  . Carotid endarterectomy Right Oct 01, 2013    CE    Social History:  reports that he has been smoking Cigars.  He has never used smokeless tobacco. He reports that he drinks about 12.6 oz of alcohol per week. He reports that he does not use illicit drugs.  Allergies:  Allergies  Allergen Reactions  . Cefaclor  Other (See Comments)     skin split  . Penicillins Rash    Medications: No current facility-administered medications for this encounter.   Current Outpatient Prescriptions  Medication Sig Dispense Refill  . aspirin 81 MG tablet Take 81 mg by mouth daily.    . Cholecalciferol (VITAMIN D PO) Take 6,000 Units by mouth daily.    . clindamycin (CLEOCIN) 150 MG capsule Take 600 mg by mouth See admin instructions. Take 4 capsules by mouth 1 hour prior to dental appointment    . esomeprazole (NEXIUM) 40 MG capsule Take 40 mg by mouth daily as needed (acid reflux).     . fenofibrate (TRICOR) 145 MG tablet Take 145 mg by mouth daily.    . fluticasone (FLONASE) 50 MCG/ACT nasal spray Place 2 sprays into both nostrils daily as needed for allergies or rhinitis.    Marland Kitchen losartan-hydrochlorothiazide (HYZAAR) 100-25 MG per tablet Take 1 tablet by mouth daily.    . naproxen sodium (ALEVE) 220 MG tablet Take 440 mg by mouth daily as needed.     . Omega-3 Fatty Acids (FISH OIL) 1200 MG CAPS Take 1 capsule by mouth daily.     . probenecid (BENEMID) 500 MG tablet Take 500 mg by mouth daily.    . rosuvastatin (CRESTOR) 10 MG tablet Take 10 mg by mouth daily.    . tadalafil (CIALIS) 5 MG tablet Take  5 mg by mouth daily.    Marland Kitchen testosterone (ANDRODERM) 4 MG/24HR PT24 patch Place 1 patch onto the skin daily.    . hydrocortisone (ANUSOL-HC) 2.5 % rectal cream Place 1 application rectally 2 (two) times daily. (Patient not taking: Reported on 08/23/2014) 30 g 1  . oxyCODONE-acetaminophen (PERCOCET/ROXICET) 5-325 MG per tablet Take 1 tablet by mouth every 6 (six) hours as needed for moderate pain. (Patient not taking: Reported on 08/23/2014) 30 tablet 0    No results found for this or any previous visit (from the past 48 hour(s)). No results found.  ROS: Pain with rom of the right lower extremity  Physical Exam:  Alert and oriented 74 y.o. male in no acute distress Cranial nerves 2-12 intact Cervical spine: full rom  with no tenderness, nv intact distally Chest: active breath sounds bilaterally, no wheeze rhonchi or rales Heart: regular rate and rhythm, no murmur Abd: non tender non distended with active bowel sounds Hip is stable with rom  Right knee with moderate tenderness to medial and lateral joint line nv intact distally Antalgic gait No rashes or edema  Assessment/Plan Assessment: right knee end stage osteoarthritis  Plan: Patient will undergo a right total knee by Dr. [name] at [facility]. Risks benefits and expectations were discussed with the patient. Patient understand risks, benefits and expectations and wishes to proceed.

## 2014-08-23 NOTE — Pre-Procedure Instructions (Addendum)
BERN FARE  08/23/2014   Your procedure is scheduled on:  Friday, September 02, 2014 at 7:30 AM.   Report to [facility] Entrance "A" Admitting Office at 5:30 AM.   Call this number if you have problems the morning of surgery: [phone]               Any questions prior to day of surgery, please call [phone] between 8 & 4 PM.    Remember:   Do not eat food or drink liquids after midnight Thursday, 09/01/14.   Take these medicines the morning of surgery with A SIP OF WATER: Oxycodone - if needed, Flonase nasal spray - if needed, Nexium - if needed  Stop Aspirin, NSAIDS (Aleve, Ibuprofen, etc.) and Fish Oil 7 days prior to surgery.   Do not wear jewelry.  Do not wear lotions, powders, or cologne.   Men may shave face and neck.  Do not bring valuables to the hospital.  [facility] is not responsible  for any belongings or valuables.               Contacts, dentures or bridgework may not be worn into surgery.  Leave suitcase in the car. After surgery it may be brought to your room.  For patients admitted to the hospital, discharge time is determined by your   treatment team.               Special Instructions: See "Preparing for Surgery" Instruction sheet.    Please read over the following fact sheets that you were given: Pain Booklet, Coughing and Deep Breathing, MRSA Information and Surgical Site Infection Prevention

## 2014-08-24 ENCOUNTER — Encounter (HOSPITAL_COMMUNITY): Payer: Self-pay

## 2014-08-24 ENCOUNTER — Encounter (HOSPITAL_COMMUNITY)
Admission: RE | Admit: 2014-08-24 | Discharge: 2014-08-24 | Disposition: A | Discharge status: Home / Self Care | Payer: Medicare Other | Source: Ambulatory Visit | Attending: Orthopedic Surgery | Admitting: Orthopedic Surgery

## 2014-08-24 DIAGNOSIS — M179 Osteoarthritis of knee, unspecified: Secondary | ICD-10-CM | POA: Diagnosis not present

## 2014-08-24 DIAGNOSIS — Z7982 Long term (current) use of aspirin: Secondary | ICD-10-CM | POA: Insufficient documentation

## 2014-08-24 DIAGNOSIS — I10 Essential (primary) hypertension: Secondary | ICD-10-CM | POA: Diagnosis not present

## 2014-08-24 DIAGNOSIS — F17290 Nicotine dependence, other tobacco product, uncomplicated: Secondary | ICD-10-CM | POA: Diagnosis not present

## 2014-08-24 DIAGNOSIS — Z79899 Other long term (current) drug therapy: Secondary | ICD-10-CM | POA: Diagnosis not present

## 2014-08-24 DIAGNOSIS — Z01812 Encounter for preprocedural laboratory examination: Secondary | ICD-10-CM | POA: Diagnosis not present

## 2014-08-24 DIAGNOSIS — Z96652 Presence of left artificial knee joint: Secondary | ICD-10-CM | POA: Diagnosis not present

## 2014-08-24 DIAGNOSIS — Z01818 Encounter for other preprocedural examination: Secondary | ICD-10-CM | POA: Insufficient documentation

## 2014-08-24 DIAGNOSIS — Z880 Allergy status to penicillin: Secondary | ICD-10-CM | POA: Diagnosis not present

## 2014-08-24 DIAGNOSIS — K219 Gastro-esophageal reflux disease without esophagitis: Secondary | ICD-10-CM | POA: Insufficient documentation

## 2014-08-24 DIAGNOSIS — G4733 Obstructive sleep apnea (adult) (pediatric): Secondary | ICD-10-CM | POA: Insufficient documentation

## 2014-08-24 HISTORY — DX: Adverse effect of unspecified anesthetic, initial encounter: T41.45XA

## 2014-08-24 HISTORY — DX: Other complications of anesthesia, initial encounter: T88.59XA

## 2014-08-24 LAB — CBC
HCT: 46.2 % (ref 39.0–52.0)
Hemoglobin: 15.3 g/dL (ref 13.0–17.0)
MCH: 30.9 pg (ref 26.0–34.0)
MCHC: 33.1 g/dL (ref 30.0–36.0)
MCV: 93.3 fL (ref 78.0–100.0)
Platelets: 239 10*3/uL (ref 150–400)
RBC: 4.95 MIL/uL (ref 4.22–5.81)
RDW: 13.7 % (ref 11.5–15.5)
WBC: 9.2 10*3/uL (ref 4.0–10.5)

## 2014-08-24 LAB — BASIC METABOLIC PANEL
Anion gap: 11 (ref 5–15)
BUN: 24 mg/dL — AB (ref 6–23)
CHLORIDE: 103 mmol/L (ref 96–112)
CO2: 23 mmol/L (ref 19–32)
Calcium: 9.4 mg/dL (ref 8.4–10.5)
Creatinine, Ser: 1.44 mg/dL — ABNORMAL HIGH (ref 0.50–1.35)
GFR calc non Af Amer: 47 mL/min — ABNORMAL LOW (ref >90)
GFR, EST AFRICAN AMERICAN: 55 mL/min — AB (ref >90)
Glucose, Bld: 137 mg/dL — ABNORMAL HIGH (ref 70–99)
Potassium: 4.3 mmol/L (ref 3.5–5.1)
Sodium: 137 mmol/L (ref 135–145)

## 2014-08-24 LAB — APTT: APTT: 28 s (ref 24–37)

## 2014-08-24 LAB — PROTIME-INR
INR: 1.19 (ref 0.00–1.49)
PROTHROMBIN TIME: 15.2 s (ref 11.6–15.2)

## 2014-08-24 LAB — SURGICAL PCR SCREEN
MRSA, PCR: NEGATIVE
STAPHYLOCOCCUS AUREUS: POSITIVE — AB

## 2014-08-24 NOTE — Progress Notes (Signed)
Requested Stress Test ,OV and EKG from Dr. Irven Shelling office

## 2014-08-24 NOTE — Progress Notes (Signed)
Prescription for Mupirocin ointment call to [facility]. Pt. Notified.

## 2014-08-26 ENCOUNTER — Encounter (HOSPITAL_COMMUNITY): Payer: Self-pay

## 2014-08-26 NOTE — Progress Notes (Signed)
Anesthesia Chart Review:  Patient is a 74 year old male scheduled for right TKA on 09/02/14 by Dr. [name].  History includes smoking, HTN, GERD, arthritis, BPH, OSA treated with surgery '00, rhinoplasty, left TKA '08, melanoma excision, carotid artery disease s/p right CEA 10/01/13 (Dr. Donnetta Hutching). BMI is consistent with obesity.  PCP is Dr. Virgina Jock. Cardiologist is Dr. Einar Gip who felt patient was at "acceptable cardiac vascular risk." for right TKA.  Of note, Dr. Einar Gip noted that patient has an "aortic sclerotic murmur."  He did not obtain an echo as he did not feel findings were consistent with severe AS.  He plans to see him back in two years and obtain echo at that time unless there is a change in his symptoms.   06/03/14 Nuclear stress test [facility] CV): Perfusion imaging demonstrates very mild diaphragmatic attenuation artifact in the inferior wall.  There was no evidence of ischemia or infarct. LV systolic function calculated by QGS was normal at 52% with normal endocardial thickening in all vascular territories.  Low risk study.   05/18/14 EKG [facility] CV): NSR.  04/26/14 Carotid duplex: Patent right CEA site with no evidence of hyperplasia or restenosis. LICA velocities suggest 40-59% stenosis.    Preoperative labs noted. Cr 1.4.  By cardiology notes, Cr 02/08/14 was 1.4.  CBC, PT/PTT WNL.   If no acute changes then I anticipate that he could proceed as planned.  George Hugh [facility] Short Stay Center/Anesthesiology Phone [phone] 08/26/2014 1:23 PM

## 2014-09-01 MED ORDER — CHLORHEXIDINE GLUCONATE 4 % EX LIQD
60.0000 mL | Freq: Once | CUTANEOUS | Status: DC
  Filled 2014-09-01: qty 60

## 2014-09-01 MED ORDER — CLINDAMYCIN PHOSPHATE 900 MG/50ML IV SOLN
900.0000 mg | INTRAVENOUS | Status: AC
  Administered 2014-09-02: 900 mg via INTRAVENOUS
  Filled 2014-09-01: qty 50

## 2014-09-02 ENCOUNTER — Inpatient Hospital Stay (HOSPITAL_COMMUNITY)
Admission: RE | Admit: 2014-09-02 | Discharge: 2014-09-04 | DRG: 470 | Disposition: A | Discharge status: Home Health | Payer: Medicare Other | Source: Ambulatory Visit | Attending: Orthopedic Surgery | Admitting: Orthopedic Surgery

## 2014-09-02 ENCOUNTER — Inpatient Hospital Stay (HOSPITAL_COMMUNITY): Payer: Medicare Other

## 2014-09-02 ENCOUNTER — Inpatient Hospital Stay (HOSPITAL_COMMUNITY): Payer: Medicare Other | Admitting: Vascular Surgery

## 2014-09-02 ENCOUNTER — Encounter (HOSPITAL_COMMUNITY): Payer: Self-pay | Admitting: *Deleted

## 2014-09-02 ENCOUNTER — Inpatient Hospital Stay (HOSPITAL_COMMUNITY): Payer: Medicare Other | Admitting: Certified Registered Nurse Anesthetist

## 2014-09-02 ENCOUNTER — Encounter (HOSPITAL_COMMUNITY)
Admission: RE | Disposition: A | Discharge status: Home Health | Payer: Self-pay | Source: Ambulatory Visit | Attending: Orthopedic Surgery

## 2014-09-02 DIAGNOSIS — Z471 Aftercare following joint replacement surgery: Secondary | ICD-10-CM | POA: Diagnosis not present

## 2014-09-02 DIAGNOSIS — Z7982 Long term (current) use of aspirin: Secondary | ICD-10-CM

## 2014-09-02 DIAGNOSIS — F17290 Nicotine dependence, other tobacco product, uncomplicated: Secondary | ICD-10-CM | POA: Diagnosis present

## 2014-09-02 DIAGNOSIS — Z880 Allergy status to penicillin: Secondary | ICD-10-CM | POA: Diagnosis not present

## 2014-09-02 DIAGNOSIS — Z96652 Presence of left artificial knee joint: Secondary | ICD-10-CM | POA: Diagnosis present

## 2014-09-02 DIAGNOSIS — Z79899 Other long term (current) drug therapy: Secondary | ICD-10-CM

## 2014-09-02 DIAGNOSIS — Z9109 Other allergy status, other than to drugs and biological substances: Secondary | ICD-10-CM | POA: Diagnosis not present

## 2014-09-02 DIAGNOSIS — M1711 Unilateral primary osteoarthritis, right knee: Secondary | ICD-10-CM | POA: Diagnosis not present

## 2014-09-02 DIAGNOSIS — M179 Osteoarthritis of knee, unspecified: Secondary | ICD-10-CM | POA: Diagnosis not present

## 2014-09-02 DIAGNOSIS — K219 Gastro-esophageal reflux disease without esophagitis: Secondary | ICD-10-CM | POA: Diagnosis present

## 2014-09-02 DIAGNOSIS — Z96651 Presence of right artificial knee joint: Secondary | ICD-10-CM

## 2014-09-02 DIAGNOSIS — Z96659 Presence of unspecified artificial knee joint: Secondary | ICD-10-CM

## 2014-09-02 DIAGNOSIS — I10 Essential (primary) hypertension: Secondary | ICD-10-CM | POA: Diagnosis present

## 2014-09-02 DIAGNOSIS — G8918 Other acute postprocedural pain: Secondary | ICD-10-CM | POA: Diagnosis not present

## 2014-09-02 DIAGNOSIS — M25561 Pain in right knee: Secondary | ICD-10-CM | POA: Diagnosis not present

## 2014-09-02 HISTORY — PX: TOTAL KNEE ARTHROPLASTY: SHX125

## 2014-09-02 HISTORY — PX: JOINT REPLACEMENT: SHX530

## 2014-09-02 LAB — CBC
HEMATOCRIT: 40.2 % (ref 39.0–52.0)
Hemoglobin: 13.1 g/dL (ref 13.0–17.0)
MCH: 30.9 pg (ref 26.0–34.0)
MCHC: 32.6 g/dL (ref 30.0–36.0)
MCV: 94.8 fL (ref 78.0–100.0)
PLATELETS: 183 10*3/uL (ref 150–400)
RBC: 4.24 MIL/uL (ref 4.22–5.81)
RDW: 14.2 % (ref 11.5–15.5)
WBC: 9.4 10*3/uL (ref 4.0–10.5)

## 2014-09-02 LAB — CREATININE, SERUM
Creatinine, Ser: 1.36 mg/dL — ABNORMAL HIGH (ref 0.50–1.35)
GFR calc Af Amer: 58 mL/min — ABNORMAL LOW (ref >90)
GFR, EST NON AFRICAN AMERICAN: 50 mL/min — AB (ref >90)

## 2014-09-02 SURGERY — ARTHROPLASTY, KNEE, TOTAL
Anesthesia: Spinal | Site: Knee | Laterality: Right

## 2014-09-02 MED ORDER — FERROUS SULFATE 325 (65 FE) MG PO TABS
325.0000 mg | ORAL_TABLET | Freq: Three times a day (TID) | ORAL | Status: DC
  Administered 2014-09-03: 325 mg via ORAL
  Filled 2014-09-02 (×2): qty 1

## 2014-09-02 MED ORDER — HYDROMORPHONE HCL 1 MG/ML IJ SOLN
1.0000 mg | INTRAMUSCULAR | Status: DC | PRN
  Administered 2014-09-02 – 2014-09-03 (×4): 1 mg via INTRAVENOUS
  Administered 2014-09-03: 2 mg via INTRAVENOUS
  Filled 2014-09-02: qty 1
  Filled 2014-09-02: qty 2
  Filled 2014-09-02 (×4): qty 1

## 2014-09-02 MED ORDER — FENOFIBRATE 160 MG PO TABS
160.0000 mg | ORAL_TABLET | Freq: Every day | ORAL | Status: DC
  Filled 2014-09-02: qty 1

## 2014-09-02 MED ORDER — VITAMIN D 1000 UNITS PO TABS
6000.0000 [IU] | ORAL_TABLET | Freq: Every day | ORAL | Status: DC
  Filled 2014-09-02: qty 6

## 2014-09-02 MED ORDER — ONDANSETRON HCL 4 MG/2ML IJ SOLN
INTRAMUSCULAR | Status: DC | PRN
  Administered 2014-09-02: 4 mg via INTRAVENOUS

## 2014-09-02 MED ORDER — TESTOSTERONE 4 MG/24HR TD PT24: 1.0000 | MEDICATED_PATCH | Freq: Every day | TRANSDERMAL | Status: DC

## 2014-09-02 MED ORDER — METOCLOPRAMIDE HCL 5 MG/ML IJ SOLN: 5.0000 mg | Freq: Three times a day (TID) | INTRAMUSCULAR | Status: DC | PRN

## 2014-09-02 MED ORDER — FENTANYL CITRATE (PF) 100 MCG/2ML IJ SOLN
INTRAMUSCULAR | Status: DC | PRN
  Administered 2014-09-02 (×5): 50 ug via INTRAVENOUS

## 2014-09-02 MED ORDER — SODIUM CHLORIDE 0.9 % IV SOLN
10.0000 mg | INTRAVENOUS | Status: DC | PRN
  Administered 2014-09-02: 20 ug/min via INTRAVENOUS

## 2014-09-02 MED ORDER — HYDROCHLOROTHIAZIDE 25 MG PO TABS
25.0000 mg | ORAL_TABLET | Freq: Every day | ORAL | Status: DC
  Filled 2014-09-02: qty 1

## 2014-09-02 MED ORDER — PROPOFOL 10 MG/ML IV BOLUS
INTRAVENOUS | Status: AC
  Filled 2014-09-02: qty 20

## 2014-09-02 MED ORDER — OXYCODONE-ACETAMINOPHEN 7.5-325 MG PO TABS: 1.0000 | ORAL_TABLET | ORAL | Status: DC | PRN

## 2014-09-02 MED ORDER — OXYCODONE HCL 5 MG PO TABS
ORAL_TABLET | ORAL | Status: AC
  Administered 2014-09-02: 10 mg via ORAL
  Filled 2014-09-02: qty 2

## 2014-09-02 MED ORDER — PANTOPRAZOLE SODIUM 40 MG PO TBEC
80.0000 mg | DELAYED_RELEASE_TABLET | Freq: Every day | ORAL | Status: DC
  Administered 2014-09-03: 80 mg via ORAL
  Filled 2014-09-02: qty 2

## 2014-09-02 MED ORDER — LACTATED RINGERS IV SOLN
INTRAVENOUS | Status: DC | PRN
  Administered 2014-09-02 (×2): via INTRAVENOUS

## 2014-09-02 MED ORDER — ACETAMINOPHEN 650 MG RE SUPP
650.0000 mg | Freq: Four times a day (QID) | RECTAL | Status: DC | PRN
  Filled 2014-09-02: qty 1

## 2014-09-02 MED ORDER — WARFARIN SODIUM 5 MG PO TABS: 5.0000 mg | ORAL_TABLET | Freq: Every day | ORAL | Status: DC

## 2014-09-02 MED ORDER — LIDOCAINE HCL (CARDIAC) 20 MG/ML IV SOLN
INTRAVENOUS | Status: DC | PRN
  Administered 2014-09-02: 80 mg via INTRAVENOUS

## 2014-09-02 MED ORDER — METHOCARBAMOL 500 MG PO TABS: 500.0000 mg | ORAL_TABLET | Freq: Three times a day (TID) | ORAL | Status: DC | PRN

## 2014-09-02 MED ORDER — FENTANYL CITRATE (PF) 250 MCG/5ML IJ SOLN
INTRAMUSCULAR | Status: AC
  Filled 2014-09-02: qty 5

## 2014-09-02 MED ORDER — DOCUSATE SODIUM 100 MG PO CAPS
100.0000 mg | ORAL_CAPSULE | Freq: Two times a day (BID) | ORAL | Status: DC
  Administered 2014-09-02 – 2014-09-03 (×3): 100 mg via ORAL
  Filled 2014-09-02 (×3): qty 1

## 2014-09-02 MED ORDER — LOSARTAN POTASSIUM 50 MG PO TABS
100.0000 mg | ORAL_TABLET | Freq: Every day | ORAL | Status: DC
  Filled 2014-09-02: qty 2

## 2014-09-02 MED ORDER — PROPOFOL INFUSION 10 MG/ML OPTIME
INTRAVENOUS | Status: DC | PRN
Start: 2014-09-02 — End: 2014-09-02
  Administered 2014-09-02: 75 ug/kg/min via INTRAVENOUS

## 2014-09-02 MED ORDER — MENTHOL 3 MG MT LOZG: 1.0000 | LOZENGE | OROMUCOSAL | Status: DC | PRN

## 2014-09-02 MED ORDER — LIDOCAINE HCL (CARDIAC) 20 MG/ML IV SOLN
INTRAVENOUS | Status: AC
  Filled 2014-09-02: qty 5

## 2014-09-02 MED ORDER — SODIUM CHLORIDE 0.9 % IJ SOLN
INTRAMUSCULAR | Status: AC
  Filled 2014-09-02: qty 10

## 2014-09-02 MED ORDER — ROPIVACAINE HCL 5 MG/ML IJ SOLN
INTRAMUSCULAR | Status: DC | PRN
  Administered 2014-09-02: 25 mL via PERINEURAL

## 2014-09-02 MED ORDER — ROSUVASTATIN CALCIUM 10 MG PO TABS
10.0000 mg | ORAL_TABLET | Freq: Every day | ORAL | Status: DC
Start: 2014-09-02 — End: 2014-09-04
  Filled 2014-09-02: qty 1

## 2014-09-02 MED ORDER — POLYETHYLENE GLYCOL 3350 17 G PO PACK: 17.0000 g | PACK | Freq: Every day | ORAL | Status: DC | PRN

## 2014-09-02 MED ORDER — ONDANSETRON HCL 4 MG PO TABS: 4.0000 mg | ORAL_TABLET | Freq: Four times a day (QID) | ORAL | Status: DC | PRN

## 2014-09-02 MED ORDER — WARFARIN VIDEO
Freq: Once | Status: AC
  Administered 2014-09-04: 09:00:00

## 2014-09-02 MED ORDER — EPHEDRINE SULFATE 50 MG/ML IJ SOLN
INTRAMUSCULAR | Status: AC
  Filled 2014-09-02: qty 1

## 2014-09-02 MED ORDER — LOSARTAN POTASSIUM-HCTZ 100-25 MG PO TABS: 1.0000 | ORAL_TABLET | Freq: Every day | ORAL | Status: DC

## 2014-09-02 MED ORDER — METHOCARBAMOL 500 MG PO TABS
500.0000 mg | ORAL_TABLET | Freq: Four times a day (QID) | ORAL | Status: DC | PRN
  Administered 2014-09-02 – 2014-09-03 (×5): 500 mg via ORAL
  Filled 2014-09-02 (×5): qty 1

## 2014-09-02 MED ORDER — ASPIRIN EC 81 MG PO TBEC
81.0000 mg | DELAYED_RELEASE_TABLET | Freq: Every day | ORAL | Status: DC
  Filled 2014-09-02 (×3): qty 1

## 2014-09-02 MED ORDER — OMEGA-3-ACID ETHYL ESTERS 1 G PO CAPS
1.0000 g | ORAL_CAPSULE | Freq: Every day | ORAL | Status: DC
  Filled 2014-09-02: qty 1

## 2014-09-02 MED ORDER — WARFARIN - PHARMACIST DOSING INPATIENT: Freq: Every day | Status: DC

## 2014-09-02 MED ORDER — WARFARIN SODIUM 7.5 MG PO TABS
7.5000 mg | ORAL_TABLET | Freq: Once | ORAL | Status: AC
  Administered 2014-09-02: 7.5 mg via ORAL
  Filled 2014-09-02: qty 1

## 2014-09-02 MED ORDER — SODIUM CHLORIDE 0.9 % IV SOLN
INTRAVENOUS | Status: DC
  Administered 2014-09-02 – 2014-09-03 (×2): via INTRAVENOUS

## 2014-09-02 MED ORDER — FLUTICASONE PROPIONATE 50 MCG/ACT NA SUSP
2.0000 | Freq: Every day | NASAL | Status: DC | PRN
  Filled 2014-09-02: qty 16

## 2014-09-02 MED ORDER — OXYCODONE HCL 5 MG PO TABS
5.0000 mg | ORAL_TABLET | ORAL | Status: DC | PRN
  Administered 2014-09-02 – 2014-09-03 (×9): 10 mg via ORAL
  Filled 2014-09-02: qty 1
  Filled 2014-09-02 (×9): qty 2

## 2014-09-02 MED ORDER — ROCURONIUM BROMIDE 50 MG/5ML IV SOLN
INTRAVENOUS | Status: AC
Start: 2014-09-02 — End: 2014-09-02
  Filled 2014-09-02: qty 1

## 2014-09-02 MED ORDER — PATIENT'S GUIDE TO USING COUMADIN BOOK
Freq: Once | Status: DC
Start: 2014-09-02 — End: 2014-09-04
  Filled 2014-09-02: qty 1

## 2014-09-02 MED ORDER — METHOCARBAMOL 500 MG PO TABS
ORAL_TABLET | ORAL | Status: AC
  Administered 2014-09-02: 500 mg via ORAL
  Filled 2014-09-02: qty 1

## 2014-09-02 MED ORDER — TADALAFIL 5 MG PO TABS
5.0000 mg | ORAL_TABLET | Freq: Every day | ORAL | Status: DC
  Filled 2014-09-02: qty 1

## 2014-09-02 MED ORDER — BISACODYL 10 MG RE SUPP: 10.0000 mg | Freq: Every day | RECTAL | Status: DC | PRN

## 2014-09-02 MED ORDER — MIDAZOLAM HCL 5 MG/5ML IJ SOLN
INTRAMUSCULAR | Status: DC | PRN
  Administered 2014-09-02: 2 mg via INTRAVENOUS

## 2014-09-02 MED ORDER — ACETAMINOPHEN 325 MG PO TABS
ORAL_TABLET | ORAL | Status: AC
  Administered 2014-09-02: 650 mg via ORAL
  Filled 2014-09-02: qty 2

## 2014-09-02 MED ORDER — METHOCARBAMOL 1000 MG/10ML IJ SOLN
500.0000 mg | INTRAVENOUS | Status: DC
  Filled 2014-09-02: qty 5

## 2014-09-02 MED ORDER — SODIUM CHLORIDE 0.9 % IR SOLN
Status: DC | PRN
  Administered 2014-09-02: 1000 mL

## 2014-09-02 MED ORDER — MIDAZOLAM HCL 2 MG/2ML IJ SOLN
INTRAMUSCULAR | Status: AC
  Filled 2014-09-02: qty 2

## 2014-09-02 MED ORDER — ONDANSETRON HCL 4 MG/2ML IJ SOLN: 4.0000 mg | Freq: Four times a day (QID) | INTRAMUSCULAR | Status: DC | PRN

## 2014-09-02 MED ORDER — PROBENECID 500 MG PO TABS
500.0000 mg | ORAL_TABLET | Freq: Every day | ORAL | Status: DC
  Filled 2014-09-02 (×3): qty 1

## 2014-09-02 MED ORDER — ACETAMINOPHEN 325 MG PO TABS
650.0000 mg | ORAL_TABLET | Freq: Four times a day (QID) | ORAL | Status: DC | PRN
  Administered 2014-09-02: 650 mg via ORAL
  Filled 2014-09-02: qty 2

## 2014-09-02 MED ORDER — ENOXAPARIN SODIUM 30 MG/0.3ML ~~LOC~~ SOLN
30.0000 mg | Freq: Two times a day (BID) | SUBCUTANEOUS | Status: DC
  Administered 2014-09-03 (×2): 30 mg via SUBCUTANEOUS
  Filled 2014-09-02 (×4): qty 0.3

## 2014-09-02 MED ORDER — PHENYLEPHRINE HCL 10 MG/ML IJ SOLN
INTRAMUSCULAR | Status: DC | PRN
  Administered 2014-09-02: 80 ug via INTRAVENOUS

## 2014-09-02 MED ORDER — CLINDAMYCIN PHOSPHATE 900 MG/50ML IV SOLN
900.0000 mg | Freq: Four times a day (QID) | INTRAVENOUS | Status: AC
  Administered 2014-09-02 (×2): 900 mg via INTRAVENOUS
  Filled 2014-09-02 (×3): qty 50

## 2014-09-02 MED ORDER — METOCLOPRAMIDE HCL 5 MG PO TABS: 5.0000 mg | ORAL_TABLET | Freq: Three times a day (TID) | ORAL | Status: DC | PRN

## 2014-09-02 MED ORDER — ARTIFICIAL TEARS OP OINT
TOPICAL_OINTMENT | OPHTHALMIC | Status: AC
  Filled 2014-09-02: qty 3.5

## 2014-09-02 MED ORDER — ONDANSETRON HCL 4 MG/2ML IJ SOLN
INTRAMUSCULAR | Status: AC
  Filled 2014-09-02: qty 2

## 2014-09-02 MED ORDER — PHENOL 1.4 % MT LIQD: 1.0000 | OROMUCOSAL | Status: DC | PRN

## 2014-09-02 MED ORDER — METHOCARBAMOL 1000 MG/10ML IJ SOLN
500.0000 mg | Freq: Four times a day (QID) | INTRAVENOUS | Status: DC | PRN
  Filled 2014-09-02: qty 5

## 2014-09-02 MED ORDER — PROPOFOL 10 MG/ML IV BOLUS
INTRAVENOUS | Status: DC | PRN
  Administered 2014-09-02 (×2): 20 mg via INTRAVENOUS
  Administered 2014-09-02: 30 mg via INTRAVENOUS

## 2014-09-02 MED ORDER — HYDROCORTISONE 2.5 % RE CREA
1.0000 "application " | TOPICAL_CREAM | Freq: Two times a day (BID) | RECTAL | Status: DC | PRN
  Filled 2014-09-02: qty 28.35

## 2014-09-02 SURGICAL SUPPLY — 57 items
BANDAGE ESMARK 6X9 LF (GAUZE/BANDAGES/DRESSINGS) ×1 IMPLANT
BLADE SAG 18X100X1.27 (BLADE) ×2 IMPLANT
BLADE SAW SGTL 13.0X1.19X90.0M (BLADE) ×2 IMPLANT
BNDG CMPR 9X6 STRL LF SNTH (GAUZE/BANDAGES/DRESSINGS) ×1
BNDG CMPR MED 10X6 ELC LF (GAUZE/BANDAGES/DRESSINGS) ×1
BNDG ELASTIC 6X10 VLCR STRL LF (GAUZE/BANDAGES/DRESSINGS) ×2 IMPLANT
BNDG ESMARK 6X9 LF (GAUZE/BANDAGES/DRESSINGS) ×2
BNDG GAUZE ELAST 4 BULKY (GAUZE/BANDAGES/DRESSINGS) ×4 IMPLANT
BOWL SMART MIX CTS (DISPOSABLE) ×2 IMPLANT
CAP KNEE TOTAL 3 SIGMA ×1 IMPLANT
CEMENT HV SMART SET (Cement) ×4 IMPLANT
COVER SURGICAL LIGHT HANDLE (MISCELLANEOUS) ×2 IMPLANT
CUFF TOURNIQUET SINGLE 34IN LL (TOURNIQUET CUFF) IMPLANT
CUFF TOURNIQUET SINGLE 44IN (TOURNIQUET CUFF) IMPLANT
DRAPE EXTREMITY T 121X128X90 (DRAPE) ×2 IMPLANT
DRAPE IMP U-DRAPE 54X76 (DRAPES) ×2 IMPLANT
DRAPE PROXIMA HALF (DRAPES) ×2 IMPLANT
DRAPE U-SHAPE 47X51 STRL (DRAPES) ×2 IMPLANT
DRSG ADAPTIC 3X8 NADH LF (GAUZE/BANDAGES/DRESSINGS) ×2 IMPLANT
DRSG PAD ABDOMINAL 8X10 ST (GAUZE/BANDAGES/DRESSINGS) ×4 IMPLANT
DURAPREP 26ML APPLICATOR (WOUND CARE) ×2 IMPLANT
ELECT CAUTERY BLADE 6.4 (BLADE) ×2 IMPLANT
ELECT REM PT RETURN 9FT ADLT (ELECTROSURGICAL) ×2
ELECTRODE REM PT RTRN 9FT ADLT (ELECTROSURGICAL) ×1 IMPLANT
GAUZE SPONGE 4X4 12PLY STRL (GAUZE/BANDAGES/DRESSINGS) ×2 IMPLANT
GLOVE BIOGEL PI ORTHO PRO 7.5 (GLOVE) ×1
GLOVE BIOGEL PI ORTHO PRO SZ8 (GLOVE) ×1
GLOVE ORTHO TXT STRL SZ7.5 (GLOVE) ×2 IMPLANT
GLOVE PI ORTHO PRO STRL 7.5 (GLOVE) ×1 IMPLANT
GLOVE PI ORTHO PRO STRL SZ8 (GLOVE) ×1 IMPLANT
GLOVE SURG ORTHO 8.5 STRL (GLOVE) ×2 IMPLANT
GOWN STRL REUS W/ TWL XL LVL3 (GOWN DISPOSABLE) ×3 IMPLANT
GOWN STRL REUS W/TWL XL LVL3 (GOWN DISPOSABLE) ×6
HANDPIECE INTERPULSE COAX TIP (DISPOSABLE) ×2
IMMOBILIZER KNEE 22 UNIV (SOFTGOODS) IMPLANT
KIT BASIN OR (CUSTOM PROCEDURE TRAY) ×2 IMPLANT
KIT MANIFOLD (MISCELLANEOUS) ×2 IMPLANT
KIT ROOM TURNOVER OR (KITS) ×2 IMPLANT
MANIFOLD NEPTUNE II (INSTRUMENTS) ×2 IMPLANT
NS IRRIG 1000ML POUR BTL (IV SOLUTION) ×2 IMPLANT
PACK TOTAL JOINT (CUSTOM PROCEDURE TRAY) ×2 IMPLANT
PACK UNIVERSAL I (CUSTOM PROCEDURE TRAY) ×2 IMPLANT
PAD ARMBOARD 7.5X6 YLW CONV (MISCELLANEOUS) ×4 IMPLANT
SET HNDPC FAN SPRY TIP SCT (DISPOSABLE) ×1 IMPLANT
STRIP CLOSURE SKIN 1/2X4 (GAUZE/BANDAGES/DRESSINGS) ×4 IMPLANT
SUCTION FRAZIER TIP 10 FR DISP (SUCTIONS) ×2 IMPLANT
SUT MNCRL AB 3-0 PS2 18 (SUTURE) ×2 IMPLANT
SUT VIC AB 0 CT1 27 (SUTURE) ×4
SUT VIC AB 0 CT1 27XBRD ANBCTR (SUTURE) ×2 IMPLANT
SUT VIC AB 1 CT1 27 (SUTURE) ×6
SUT VIC AB 1 CT1 27XBRD ANBCTR (SUTURE) ×3 IMPLANT
SUT VIC AB 2-0 CT1 27 (SUTURE) ×4
SUT VIC AB 2-0 CT1 TAPERPNT 27 (SUTURE) ×2 IMPLANT
TOWEL OR 17X24 6PK STRL BLUE (TOWEL DISPOSABLE) ×2 IMPLANT
TOWEL OR 17X26 10 PK STRL BLUE (TOWEL DISPOSABLE) ×2 IMPLANT
TRAY FOLEY CATH 16FRSI W/METER (SET/KITS/TRAYS/PACK) IMPLANT
WATER STERILE IRR 1000ML POUR (IV SOLUTION) ×4 IMPLANT

## 2014-09-02 NOTE — Transfer of Care (Signed)
Immediate Anesthesia Transfer of Care Note  Patient: [name]  Procedure(s) Performed: Procedure(s): RIGHT TOTAL KNEE ARTHROPLASTY (Right)  Patient Location: PACU  Anesthesia Type:General  Level of Consciousness: awake, alert , oriented and patient cooperative  Airway & Oxygen Therapy: Patient Spontanous Breathing and Patient connected to nasal cannula oxygen  Post-op Assessment: Report given to RN, Post -op Vital signs reviewed and stable and Patient moving all extremities X 4  Post vital signs: Reviewed and stable  Last Vitals:  Filed Vitals:   09/02/14 0555  BP: 106/77  Pulse: 84  Temp: 36.8 C  Resp: 20    Complications: No apparent anesthesia complications

## 2014-09-02 NOTE — Op Note (Signed)
NAMEKENWOOD, ROSIAK NO.:  [id number]  MEDICAL RECORD NO.:  [id number]  LOCATION:  5N06C                        FACILITY:  [facility]  PHYSICIAN:  [name]. [name], M.D. DATE OF BIRTH:  10-Jun-1940  DATE OF PROCEDURE:  09/02/2014 DATE OF DISCHARGE:                              OPERATIVE REPORT   PREOPERATIVE DIAGNOSIS:  Right knee end-stage osteoarthritis.  POSTOPERATIVE DIAGNOSIS:  Right knee end-stage osteoarthritis.  PROCEDURE PERFORMED:  Right total knee replacement using DePuy Sigma rotating platform prosthesis.  ATTENDING SURGEON:  [name]. [name], MD.  ASSISTANT:  Abbott Pao. Dixon, PA-C, who scrubbed the entire procedure and was necessary for satisfactory completion of surgery.  ANESTHESIA:  Spinal block anesthesia plus MAC was used.  ESTIMATED BLOOD LOSS:  Minimal.  FLUID REPLACEMENT:  1200 mL crystalloid.  INSTRUMENT COUNTS:  Correct.  COMPLICATIONS:  There were no complications.  ANTIBIOTICS:  Perioperative antibiotics were given.  INDICATIONS:  The patient is a 74 year old male who suffers from end- stage arthritis of the right knee.  The patient has had disabling pain, limiting activities.  The patient was unable to walk more than several blocks due to pain and has bone-on-bone on a plain film, standing x- rays, and has had numerous injections over the years.  He is status post left total knee.  He has done well with that.  Desires right total knee replacement to restore function, eliminate pain to his knee.  Informed consent was obtained.  DESCRIPTION OF PROCEDURE:  After an adequate level of anesthesia was achieved, the patient was positioned in the supine position.  The right leg was correctly identified and sterilely prepped and draped in the usual manner after placement of a nonsterile tourniquet.  After, a time- out was called.  We elevated the leg and exsanguinated using the Esmarch bandage and elevated the tourniquet to 300 mmHg.  A  longitudinal midline incision was created with the knee in flexion.  The medial parapatellar arthrotomy was created with a fresh 10 blade scalpel.  The patella was everted, lateral patellofemoral ligaments were divided.  We then entered the distal femur using a step-cut drill.  We then placed the intramedullary distal femoral resection guide, set on 10 mm resection, 5 degrees right.  We made our distal cut.  We then sized the femur size 5, anterior down and made our cuts with the 4 in 1 jig.  Anterior, posterior, and chamfer cuts were performed.  ACL, PCL, remaining meniscal tissue removed.  Tibia subluxed anteriorly.  The tibia was cut proximally with the external alignment jig, set on 2 mm resection off the affected medial side.  This was an external alignment jig, with minimal posterior slope to this posterior cruciate substituting prosthesis, with our cut perpendicular to the long axis of the tibia. Once we had our tibial cut performed, we removed excess soft osteophytes off the medial tibia and sized the proximal tibia to a size 4.  We checked our gaps, which were symmetric.  It looked like a little more than 10 mm, probably 12.5 but were symmetric.  We removed excess posterior bone using a three-quarter inch curved osteotome and rongeur. Next, we went ahead  and completed our tibial preparation with a modular drill and keel punch for the tibial tray and left that trial in place, marking the midline for that.  We made good coverage of the bone with a 4.  We then went ahead and did our box cut for the posterior cruciate substituting femoral prosthesis.  Once we had that cut made with the oscillating saw, we then impacted the real size 5 right femur in place, we reduced the knee with a 12.5 poly and a nice tight fit.  We were able to get full extension; we were stable in flexion.  We then resurfaced the patella going from a size 24 mm down to 17 and then cut and drilled the lugs for the  38 patella.  We then placed the patellar button in place and ranged the knee and had nice patellar tracking with no-touch technique.  We then went ahead and removed all trial components, pulse irrigated the knee, thoroughly dried and cemented the components into place with DePuy high viscosity cement, tibia, femur, then patella.  We used a patellar clamp.  We placed a 12.5 poly insert in place with the knee in full extension for compression of the components.  We allowed the cement to harden, removed excess cement with quarter-inch curved osteotome, inspecting the posterior aspect of the knee as well.  With the trial 12.5 being nice and secure with full extension and stability in flexion and extension, we went ahead and selected 12.5 real poly, irrigated the knee thoroughly with the pulse irrigator, and then placed the real 12.5 poly in place; and we were able to reduce the knee with a nice snap medially.  We then placed the knee in about 30 degrees of flexion, irrigated thoroughly and then repaired our parapatellar arthrotomy with interrupted #1 Vicryl suture, followed by 0 Vicryl and 2- 0 Vicryl layered subcutaneous closure and 4-0 Monocryl for skin.  Steri- Strips applied followed by sterile dressing.  The patient tolerated the surgery well.     [name]. [name], M.D.     SRN/MEDQ  D:  09/02/2014  T:  09/02/2014  Job:  [id number]

## 2014-09-02 NOTE — Progress Notes (Signed)
Utilization review completed.  

## 2014-09-02 NOTE — Anesthesia Preprocedure Evaluation (Signed)
Anesthesia Evaluation  Patient identified by MRN, date of birth, ID band Patient awake    Reviewed: Allergy & Precautions, NPO status , Patient's Chart, lab work & pertinent test results  Airway Mallampati: II  TM Distance: >3 FB Neck ROM: Full    Dental no notable dental hx.    Pulmonary sleep apnea , Current Smoker,  breath sounds clear to auscultation  Pulmonary exam normal       Cardiovascular hypertension, + Peripheral Vascular Disease Rhythm:Regular Rate:Normal     Neuro/Psych negative neurological ROS  negative psych ROS   GI/Hepatic negative GI ROS, Neg liver ROS,   Endo/Other  negative endocrine ROS  Renal/GU negative Renal ROS  negative genitourinary   Musculoskeletal negative musculoskeletal ROS (+)   Abdominal   Peds negative pediatric ROS (+)  Hematology negative hematology ROS (+)   Anesthesia Other Findings   Reproductive/Obstetrics negative OB ROS                             Anesthesia Physical Anesthesia Plan  ASA: III  Anesthesia Plan: Spinal   Post-op Pain Management:    Induction: Intravenous  Airway Management Planned: Simple Face Mask  Additional Equipment:   Intra-op Plan:   Post-operative Plan:   Informed Consent: I have reviewed the patients History and Physical, chart, labs and discussed the procedure including the risks, benefits and alternatives for the proposed anesthesia with the patient or authorized representative who has indicated his/her understanding and acceptance.   Dental advisory given  Plan Discussed with: CRNA and Surgeon  Anesthesia Plan Comments:         Anesthesia Quick Evaluation

## 2014-09-02 NOTE — Interval H&P Note (Signed)
History and Physical Interval Note:  09/02/2014 7:28 AM  Kerin Salen  has presented today for surgery, with the diagnosis of RIGHT KNEE OA  The various methods of treatment have been discussed with the patient and family. After consideration of risks, benefits and other options for treatment, the patient has consented to  Procedure(s): RIGHT TOTAL KNEE ARTHROPLASTY (Right) as a surgical intervention .  The patient's history has been reviewed, patient examined, no change in status, stable for surgery.  I have reviewed the patient's chart and labs.  Questions were answered to the patient's satisfaction.     Tiera Mensinger,STEVEN R

## 2014-09-02 NOTE — Progress Notes (Signed)
Patient refusing to allow staff to dispense hospital meds, and refusing to allow staff to send home meds to pharmacy to dispense. States he "had a bad episode" during a previous hospitalization and that he will not take any medication from [place], unless it is pain meds, coumadin, or antibiotics. Dr. [name] made aware, and instructed staff to ensure patient does not take naproxen.  Patient states understanding. Will continue to monitor.

## 2014-09-02 NOTE — Anesthesia Procedure Notes (Addendum)
Anesthesia Regional Block:  Femoral nerve block  Pre-Anesthetic Checklist: ,, timeout performed, Correct Patient, Correct Site, Correct Laterality, Correct Procedure, Correct Position, site marked, Risks and benefits discussed,  Surgical consent,  Pre-op evaluation,  At surgeon's request and post-op pain management  Laterality: Right  Prep: chloraprep       Needles:  Injection technique: Single-shot  Needle Type: Echogenic Stimulator Needle     Needle Length: 9cm 9 cm Needle Gauge: 21 and 21 G    Additional Needles:  Procedures: ultrasound guided (picture in chart) Femoral nerve block Narrative:  Injection made incrementally with aspirations every 5 mL.  Performed by: Personally   Additional Notes: Patient tolerated the procedure well without complications   Spinal Patient location during procedure: OR Staffing Performed by: anesthesiologist  Preanesthetic Checklist Completed: patient identified, site marked, surgical consent, pre-op evaluation, timeout performed, IV checked, risks and benefits discussed and monitors and equipment checked Spinal Block Patient position: sitting Prep: Betadine Patient monitoring: heart rate, continuous pulse ox and blood pressure Injection technique: single-shot Needle Needle type: Sprotte  Needle gauge: 24 G Needle length: 9 cm Additional Notes Expiration date of kit checked and confirmed. Patient tolerated procedure well, without complications.

## 2014-09-02 NOTE — Brief Op Note (Signed)
09/02/2014  9:55 AM  PATIENT:  Kerin Salen  74 y.o. male  PRE-OPERATIVE DIAGNOSIS:  RIGHT KNEE OA, END STAGE  POST-OPERATIVE DIAGNOSIS:  RIGHT KNEE OA, END STAGE  PROCEDURE:  Procedure(s): RIGHT TOTAL KNEE ARTHROPLASTY (Right),DePuy Sigma RP  SURGEON:  Surgeon(s) and Role:    * Netta Cedars, MD - Primary  PHYSICIAN ASSISTANT:   ASSISTANTS: [name], PA-C   ANESTHESIA:   regional and MAC  EBL:  Total I/O In: 1500 [I.V.:1500] Out: 480 [Urine:430; Blood:50]  BLOOD ADMINISTERED:none  DRAINS: none   LOCAL MEDICATIONS USED:  NONE  SPECIMEN:  No Specimen  DISPOSITION OF SPECIMEN:  N/A  COUNTS:  YES  TOURNIQUET:   Total Tourniquet Time Documented: Thigh (Right) - 100 minutes Total: Thigh (Right) - 100 minutes   DICTATION: .Other Dictation: Dictation Number [id number]  PLAN OF CARE: Admit to inpatient   PATIENT DISPOSITION:  PACU - hemodynamically stable.   Delay start of Pharmacological VTE agent (>24hrs) due to surgical blood loss or risk of bleeding: no

## 2014-09-02 NOTE — Discharge Instructions (Signed)
Weight Bearing as Tolerated right knee  Please keep the incision clean and dry for one week, then ok to shower.  DO NOT PROP ANYTHING BEHIND THE KNEE, prop under the ankle to work on knee extension(straightening)  Do exercises every hour while awake.  CPM 6-8 hours per day in two hour blocks  Follow up with Dr [name] in 2 weeks  Coumadin for INR target of 2.5-3.0 for 30 days

## 2014-09-02 NOTE — Progress Notes (Addendum)
ANTICOAGULATION CONSULT NOTE - Initial Consult  Pharmacy Consult for Coumadin Indication: VTE prophylaxis  Allergies  Allergen Reactions  . Cefaclor Other (See Comments)     skin split  . Penicillins Rash    Patient Measurements: Height: 5\' 8"  (172.7 cm) Weight: 218 lb (98.884 kg) IBW/kg (Calculated) : 68.4  Vital Signs: Temp: 98.7 F (37.1 C) (04/15 1417) Temp Source: Oral (04/15 1417) BP: 133/78 mmHg (04/15 1417) Pulse Rate: 80 (04/15 1417)  Pre-op labs from 08/24/14:   PT = 15.2 sec, INR 1.19   Hgb 15.3, platelet count 239   Creatinine 1.44  Estimated Creatinine Clearance: 52.9 mL/min (by C-G formula based on Cr of 1.44).   Medical History: Past Medical History  Diagnosis Date  . GERD (gastroesophageal reflux disease)   . Gout     pt denies 10/01/2013  . Hypertension   . Colon polyps   . Prostate hypertrophy   . Arthritis   . Skin cancer   . Complication of anesthesia     after surgery 07-2013 unable to sleep for 3 days  . Sleep apnea     had suegery in 2000 for sleep apnea   Assessment:   74 yr old male s/p right TKA to begin Coumadin for VTE prophylaxis.  Will also begin Lovenox 30 mg sq q12hrs tonight (~ 12 hrs post-op) and continue until INR >/= 1.8.   Home meds continued, including EC ASA 81 mg daily.  Was off Aspirin for ~ 5 days pre-op.   Hx includes diverticular bleed in 2012 and blood in stool in March 2016, possibly due to internal hemorrhoids. Patient reports recent check at Dr. [employer] [employer] office that was negative.  He will let us know if any recurrence while on Coumadin. No problems when on Coumadin for ~30 days after left knee surgery.   Ferrous sulfate 325 mg TID ordered post-op, plus Docusate.  He is aware that iron can darken stools and may cause constipation.  Goal of Therapy:  INR 2-3 Monitor platelets by anticoagulation protocol: Yes   Plan:   Coumadin 7.5 mg x 1 tonight.  Lovenox 30 mg sq q12hrs until INR >/= 1.8.  Daily PT/INR.  Coumadin  book and video ordered.  Coumadin education prior to discharge.  Arty Baumgartner,  Pager: [phone] 09/02/2014,2:56 PM

## 2014-09-02 NOTE — Anesthesia Postprocedure Evaluation (Signed)
  Anesthesia Post-op Note  Patient: [name]  Procedure(s) Performed: Procedure(s) (LRB): RIGHT TOTAL KNEE ARTHROPLASTY (Right)  Patient Location: PACU  Anesthesia Type: Spinal  Level of Consciousness: awake and alert   Airway and Oxygen Therapy: Patient Spontanous Breathing  Post-op Pain: mild  Post-op Assessment: Post-op Vital signs reviewed, Patient's Cardiovascular Status Stable, Respiratory Function Stable, Patent Airway and No signs of Nausea or vomiting  Last Vitals:  Filed Vitals:   09/02/14 1130  BP:   Pulse: 76  Temp:   Resp: 18    Post-op Vital Signs: stable   Complications: No apparent anesthesia complications

## 2014-09-02 NOTE — Evaluation (Signed)
Physical Therapy Evaluation Patient Details Name: [name] MRN: [id number] DOB: 07/08/1940 Today's Date: 09/02/2014   History of Present Illness  74 y.o. male admitted to [facility] on 09/02/14 for elective R TKA.  Pt with significant PMHx of gout, HTN, and L TKA.    Clinical Impression  Pt is POD #0 and is moving well min to min guard assist with RW.  Pt will likely progress well enough to go home with his "partner" Deborah's 24/7 assist and a RW.   PT to follow acutely for deficits listed below.       Follow Up Recommendations Home health PT;Supervision for mobility/OOB    Equipment Recommendations  Rolling walker with 5" wheels    Recommendations for Other Services   NA    Precautions / Restrictions Precautions Precautions: Knee Precaution Booklet Issued: Yes (comment) Precaution Comments: exercise handout given (not filled out) Required Braces or Orthoses: Knee Immobilizer - Right Restrictions Weight Bearing Restrictions: Yes RLE Weight Bearing: Weight bearing as tolerated      Mobility  Bed Mobility Overal bed mobility: Needs Assistance Bed Mobility: Supine to Sit     Supine to sit: Min assist     General bed mobility comments: Min assist to help progress right leg over EOB.   Transfers Overall transfer level: Needs assistance Equipment used: Rolling walker (2 wheeled) Transfers: Sit to/from Stand Sit to Stand: Min guard         General transfer comment: Min guard assist to steady pt for balance during transitions.   Ambulation/Gait Ambulation/Gait assistance: Min guard Ambulation Distance (Feet): 8 Feet Assistive device: Rolling walker (2 wheeled) Gait Pattern/deviations: Step-to pattern     General Gait Details: Verbal cues for safest LE sequencing during gait.   Min guard assist for safety.          Balance Overall balance assessment: Needs assistance Sitting-balance support: Feet supported;No upper extremity supported Sitting balance-Leahy  Scale: Good     Standing balance support: Bilateral upper extremity supported Standing balance-Leahy Scale: Poor                               Pertinent Vitals/Pain Pain Assessment: 0-10 Pain Score: 2  Pain Location: right knee Pain Descriptors / Indicators: Aching;Burning Pain Intervention(s): Limited activity within patient's tolerance;Monitored during session;Repositioned    Home Living Family/patient expects to be discharged to:: Private residence Living Arrangements: Spouse/significant other (Debroah) Available Help at Discharge: Family;Available 24 hours/day Type of Home: House Home Access: Stairs to enter Entrance Stairs-Rails: Right;Left;Can reach both ("grab bars") Entrance Stairs-Number of Steps: 3 Home Layout: Able to live on main level with bedroom/bathroom Home Equipment: Cane - single point;Bedside commode;Grab bars - tub/shower      Prior Function Level of Independence: Independent         Comments: works full time from home     Hand Dominance   Dominant Hand: Right    Extremity/Trunk Assessment   Upper Extremity Assessment: Overall WFL for tasks assessed           Lower Extremity Assessment: RLE deficits/detail RLE Deficits / Details: right leg with normal post op pain and weakness.  Pt with 3/4 ankle PF/DF, 2/5 knee, 2+/5 hip flexion    Cervical / Trunk Assessment: Normal  Communication   Communication: No difficulties  Cognition Arousal/Alertness: Awake/alert Behavior During Therapy: WFL for tasks assessed/performed Overall Cognitive Status: Within Functional Limits for tasks assessed  Exercises Total Joint Exercises Ankle Circles/Pumps: AROM;Both;10 reps;Supine      Assessment/Plan    PT Assessment Patient needs continued PT services  PT Diagnosis Difficulty walking;Abnormality of gait;Generalized weakness;Acute pain   PT Problem List Decreased strength;Decreased range of  motion;Decreased activity tolerance;Decreased balance;Decreased mobility;Decreased knowledge of use of DME;Decreased knowledge of precautions;Pain  PT Treatment Interventions DME instruction;Gait training;Stair training;Functional mobility training;Therapeutic activities;Therapeutic exercise;Balance training;Neuromuscular re-education;Patient/family education;Modalities;Manual techniques   PT Goals (Current goals can be found in the Care Plan section) Acute Rehab PT Goals Patient Stated Goal: to go home in AM on Sunday PT Goal Formulation: With patient Time For Goal Achievement: 09/09/14 Potential to Achieve Goals: Good    Frequency 7X/week    End of Session Equipment Utilized During Treatment: Right knee immobilizer Activity Tolerance: Patient limited by fatigue;Patient limited by pain Patient left: in chair;with call bell/phone within reach           Time: 1633-1650 PT Time Calculation (min) (ACUTE ONLY): 17 min   Charges:   PT Evaluation $Initial PT Evaluation Tier I: 1 Procedure          Terica Yogi B. Sherrill, [place], DPT [phone]   09/02/2014, 5:07 PM

## 2014-09-02 NOTE — Progress Notes (Signed)
Orthopedic Tech Progress Note Patient Details:  [name] 04-20-1941 [id number]  CPM Right Knee CPM Right Knee: On Right Knee Flexion (Degrees): 60 Right Knee Extension (Degrees): 0   Cammer, Theodoro Parma 09/02/2014, 11:46 AM

## 2014-09-03 LAB — BASIC METABOLIC PANEL
Anion gap: 9 (ref 5–15)
BUN: 12 mg/dL (ref 6–23)
CALCIUM: 8.3 mg/dL — AB (ref 8.4–10.5)
CO2: 29 mmol/L (ref 19–32)
Chloride: 97 mmol/L (ref 96–112)
Creatinine, Ser: 1.22 mg/dL (ref 0.50–1.35)
GFR, EST AFRICAN AMERICAN: 67 mL/min — AB (ref >90)
GFR, EST NON AFRICAN AMERICAN: 57 mL/min — AB (ref >90)
Glucose, Bld: 113 mg/dL — ABNORMAL HIGH (ref 70–99)
Potassium: 4.1 mmol/L (ref 3.5–5.1)
Sodium: 135 mmol/L (ref 135–145)

## 2014-09-03 LAB — PROTIME-INR
INR: 1.4 (ref 0.00–1.49)
PROTHROMBIN TIME: 17.3 s — AB (ref 11.6–15.2)

## 2014-09-03 LAB — CBC
HCT: 36.9 % — ABNORMAL LOW (ref 39.0–52.0)
HEMOGLOBIN: 11.9 g/dL — AB (ref 13.0–17.0)
MCH: 30.9 pg (ref 26.0–34.0)
MCHC: 32.2 g/dL (ref 30.0–36.0)
MCV: 95.8 fL (ref 78.0–100.0)
Platelets: 154 10*3/uL (ref 150–400)
RBC: 3.85 MIL/uL — ABNORMAL LOW (ref 4.22–5.81)
RDW: 14 % (ref 11.5–15.5)
WBC: 7.6 10*3/uL (ref 4.0–10.5)

## 2014-09-03 MED ORDER — ALUM & MAG HYDROXIDE-SIMETH 200-200-20 MG/5ML PO SUSP
30.0000 mL | Freq: Four times a day (QID) | ORAL | Status: DC | PRN
  Administered 2014-09-03: 30 mL via ORAL
  Filled 2014-09-03: qty 30

## 2014-09-03 MED ORDER — WARFARIN SODIUM 7.5 MG PO TABS
7.5000 mg | ORAL_TABLET | Freq: Once | ORAL | Status: AC
  Administered 2014-09-03: 7.5 mg via ORAL
  Filled 2014-09-03: qty 1

## 2014-09-03 NOTE — Progress Notes (Signed)
Physical Therapy Treatment Patient Details Name: [name] MRN: [id number] DOB: 01/13/1941 Today's Date: 09/03/2014    History of Present Illness 74 y.o. male admitted to [facility] on 09/02/14 for elective R TKA.  Pt with significant PMHx of gout, HTN, and L TKA.      PT Comments    Patient agreeable to therapy, Supervision for mobility and transfers, demonstrates good quad strength but still used knee Immobilizer for tx session.  Patient cleared by PT to ambulate in room/hall with RW and KI.  Patient remains appropriate for skilled PT services, will continue.  Follow Up Recommendations  Home health PT;Supervision for mobility/OOB     Equipment Recommendations  Rolling walker with 5" wheels    Recommendations for Other Services       Precautions / Restrictions Precautions Precautions: Knee Required Braces or Orthoses: Knee Immobilizer - Right Restrictions Weight Bearing Restrictions: Yes RLE Weight Bearing: Weight bearing as tolerated    Mobility  Bed Mobility Overal bed mobility: Needs Assistance Bed Mobility: Supine to Sit     Supine to sit: Supervision        Transfers Overall transfer level: Needs assistance Equipment used: Rolling walker (2 wheeled) Transfers: Sit to/from Stand Sit to Stand: Supervision            Ambulation/Gait Ambulation/Gait assistance: Supervision Ambulation Distance (Feet): 75 Feet Assistive device: Rolling walker (2 wheeled) Gait Pattern/deviations: Step-through pattern;Antalgic   Gait velocity interpretation: Below normal speed for age/gender     Stairs Stairs: Yes Stairs assistance: Supervision Stair Management: Two rails Number of Stairs: 2 General stair comments: good sequencing  Wheelchair Mobility    Modified Rankin (Stroke Patients Only)       Balance Overall balance assessment: Needs assistance   Sitting balance-Leahy Scale: Good       Standing balance-Leahy Scale: Poor                       Cognition Arousal/Alertness: Awake/alert Behavior During Therapy: WFL for tasks assessed/performed Overall Cognitive Status: Within Functional Limits for tasks assessed                      Exercises Total Joint Exercises Ankle Circles/Pumps: AROM;Both;10 reps;Supine Quad Sets: AROM;Right;10 reps;Supine Heel Slides: AROM;Right;10 reps;Supine Straight Leg Raises: AROM;10 reps;Supine;Right Long Arc Quad: AROM;10 reps;Right;Seated Knee Flexion: AROM;Right;10 reps;Seated Goniometric ROM: 5-95    General Comments        Pertinent Vitals/Pain Pain Assessment: 0-10 Pain Score: 3  Pain Location: R knee Pain Descriptors / Indicators: Aching Pain Intervention(s): Monitored during session;Premedicated before session    Home Living                      Prior Function            PT Goals (current goals can now be found in the care plan section) Acute Rehab PT Goals Patient Stated Goal: to go home in AM on Sunday PT Goal Formulation: With patient Time For Goal Achievement: 09/09/14 Potential to Achieve Goals: Good    Frequency  7X/week    PT Plan      Co-evaluation             End of Session Equipment Utilized During Treatment: Right knee immobilizer Activity Tolerance: Patient limited by fatigue;Patient limited by pain Patient left: in bed;with call bell/phone within reach     Time: 1145-1220 PT Time Calculation (min) (ACUTE ONLY): 35 min  Charges:  $  Gait Training: 8-22 mins $Therapeutic Exercise: 8-22 mins                    G Codes:      Mekaila Tarnow L 2014-09-14, 1:37 PM

## 2014-09-03 NOTE — Progress Notes (Signed)
[place] for Coumadin Indication: VTE prophylaxis  Allergies  Allergen Reactions  . Cefaclor Other (See Comments)     skin split  . Penicillins Rash    Patient Measurements: Height: 5\' 8"  (172.7 cm) Weight: 218 lb (98.884 kg) IBW/kg (Calculated) : 68.4  Vital Signs: Temp: 99 F (37.2 C) (04/16 0509) BP: 102/68 mmHg (04/16 0509) Pulse Rate: 81 (04/16 0509)  Estimated Creatinine Clearance: 62.4 mL/min (by C-G formula based on Cr of 1.22).  Assessment: 74 yr old male s/p right TKA to begin Coumadin for VTE prophylaxis, also on Lovenox 30 mg sq q12h - continue until INR >/= 1.8.  INR currently 1.4. Hgb has dropped post-op, plts relatively stable. No overt bleeding noted.  Goal of Therapy:  INR 2-3 Monitor platelets by anticoagulation protocol: Yes   Plan:  -warfarin 7.5 mg x 1 tonight. -Lovenox 30 mg subQ q12h until INR >/= 1.8. -Daily PT/INR  Cristina Mattern D. Amour Trigg, PharmD, BCPS Clinical Pharmacist Pager: [phone] 09/03/2014 11:42 AM

## 2014-09-03 NOTE — Progress Notes (Signed)
OT Cancellation Note  Patient Details Name: [name] MRN: [id number] DOB: [birth year]   Cancelled Treatment:    Reason Eval/Treat Not Completed: OT screened, no needs identified, will sign off  Darlina Rumpf [place], OTR/L [phone]  09/03/2014, 9:40 AM

## 2014-09-03 NOTE — Progress Notes (Signed)
CSW received referral for questionable SNF.   Chart reviewed. PT recommended HHPT  Will advise RN Case Manager for assessment of home health and DME needs.   CSW will sign off. Please re-consult is CSW needs arise.    [facility]  2S, 81M, 5N, 6N, [place], PEDS/PICU [phone]

## 2014-09-03 NOTE — Progress Notes (Signed)
Orthopedics Progress Note  Subjective: I am doing well this morning  Objective:  Filed Vitals:   09/03/14 0509  BP: 102/68  Pulse: 81  Temp: 99 F (37.2 C)  Resp: 16    General: Awake and alert  Musculoskeletal: right knee dressing CDI, patient in CPM well above 90 degrees. Neurovascularly intact  Lab Results  Component Value Date   WBC 7.6 09/03/2014   HGB 11.9* 09/03/2014   HCT 36.9* 09/03/2014   MCV 95.8 09/03/2014   PLT 154 09/03/2014       Component Value Date/Time   NA 135 09/03/2014 0620   K 4.1 09/03/2014 0620   CL 97 09/03/2014 0620   CO2 29 09/03/2014 0620   GLUCOSE 113* 09/03/2014 0620   BUN 12 09/03/2014 0620   CREATININE 1.22 09/03/2014 0620   CALCIUM 8.3* 09/03/2014 0620   GFRNONAA 57* 09/03/2014 0620   GFRAA 67* 09/03/2014 0620    Lab Results  Component Value Date   INR 1.40 09/03/2014   INR 1.19 08/24/2014   INR 1.21 09/23/2013    Assessment/Plan: POD #1 s/p Procedure(s): RIGHT TOTAL KNEE ARTHROPLASTY Plan D/C tomorrow  Remo Lipps R. [name], MD 09/03/2014 9:16 AM

## 2014-09-03 NOTE — Care Management Note (Signed)
CARE MANAGEMENT NOTE 09/03/2014  Patient:  [name], [name]   Account Number:  [id number]  Date Initiated:  09/02/2014  Documentation initiated by:  [facility]  Subjective/Objective Assessment:   s/p rt total knee arthroplasty     Action/Plan:   PT/OT evals   Anticipated DC Date:  09/04/2014   Anticipated DC Plan:  [place]  CM consult      [facility] Choice  HOME HEALTH   Choice offered to / List presented to:  C-1 Patient   DME arranged  CPM      DME agency  [employer]     [place] arranged  HH-2 PT      [place]   Status of service:  Completed, signed off Medicare Important Message given?   (If response is "NO", the following Medicare IM given date fields will be blank) Date Medicare IM given:   Medicare IM given by:   Date Additional Medicare IM given:  09/03/2014 Additional Medicare IM given by:  Norina Buzzard  Discharge Disposition:  [place]  Per UR Regulation:  Reviewed for med. necessity/level of care/duration of stay  If discussed at [place] of Stay Meetings, dates discussed:    Comments:  09/02/14 Set up with [place] for HHPT by MD office.

## 2014-09-03 NOTE — Progress Notes (Signed)
PT Cancellation Note  Patient Details Name: [name] MRN: [id number] DOB: [birth year]   Cancelled Treatment:    Reason Eval/Treat Not Completed: Patient declined, no reason specified (Patient reports pending procedure by nursing. )   Judith Blonder L 09/03/2014, 4:37 PM

## 2014-09-04 LAB — CBC
HEMATOCRIT: 38 % — AB (ref 39.0–52.0)
HEMOGLOBIN: 12.5 g/dL — AB (ref 13.0–17.0)
MCH: 30.5 pg (ref 26.0–34.0)
MCHC: 32.9 g/dL (ref 30.0–36.0)
MCV: 92.7 fL (ref 78.0–100.0)
Platelets: 145 10*3/uL — ABNORMAL LOW (ref 150–400)
RBC: 4.1 MIL/uL — AB (ref 4.22–5.81)
RDW: 13.6 % (ref 11.5–15.5)
WBC: 7.8 10*3/uL (ref 4.0–10.5)

## 2014-09-04 LAB — PROTIME-INR
INR: 2.22 — AB (ref 0.00–1.49)
PROTHROMBIN TIME: 24.8 s — AB (ref 11.6–15.2)

## 2014-09-04 NOTE — Discharge Summary (Signed)
Physician Discharge Summary   Patient ID: LUE DUBUQUE MRN: [id number] DOB/AGE: 28-Apr-1941 74 y.o.  Admit date: 09/02/2014 Discharge date: 09/04/2014  Admission Diagnoses:  Active Problems:   S/P TKR (total knee replacement) using cement   Discharge Diagnoses:  Same   Surgeries: Procedure(s): RIGHT TOTAL KNEE ARTHROPLASTY on 09/02/2014   Consultants: OT, PT, case management  Discharged Condition: Stable  Hospital Course: ORIS STAFFIERI is an 74 y.o. male who was admitted 09/02/2014 with a chief complaint of right knee pain, and found to have a diagnosis of right knee primary osteoarthritis, end-stage.  They were brought to the operating room on 09/02/2014 and underwent the above named procedures.    The patient had an uncomplicated hospital course and was stable for discharge.  Recent vital signs:  Filed Vitals:   09/04/14 0551  BP: 166/84  Pulse: 99  Temp: 98.2 F (36.8 C)  Resp: 18    Recent laboratory studies:  Results for orders placed or performed during the hospital encounter of 09/02/14  CBC  Result Value Ref Range   WBC 9.4 4.0 - 10.5 K/uL   RBC 4.24 4.22 - 5.81 MIL/uL   Hemoglobin 13.1 13.0 - 17.0 g/dL   HCT 40.2 39.0 - 52.0 %   MCV 94.8 78.0 - 100.0 fL   MCH 30.9 26.0 - 34.0 pg   MCHC 32.6 30.0 - 36.0 g/dL   RDW 14.2 11.5 - 15.5 %   Platelets 183 150 - 400 K/uL  Creatinine, serum  Result Value Ref Range   Creatinine, Ser 1.36 (H) 0.50 - 1.35 mg/dL   GFR calc non Af Amer 50 (L) >90 mL/min   GFR calc Af Amer 58 (L) >90 mL/min  Protime-INR  Result Value Ref Range   Prothrombin Time 17.3 (H) 11.6 - 15.2 seconds   INR 1.40 0.00 - 1.49  CBC  Result Value Ref Range   WBC 7.6 4.0 - 10.5 K/uL   RBC 3.85 (L) 4.22 - 5.81 MIL/uL   Hemoglobin 11.9 (L) 13.0 - 17.0 g/dL   HCT 36.9 (L) 39.0 - 52.0 %   MCV 95.8 78.0 - 100.0 fL   MCH 30.9 26.0 - 34.0 pg   MCHC 32.2 30.0 - 36.0 g/dL   RDW 14.0 11.5 - 15.5 %   Platelets 154 150 - 400 K/uL  Basic metabolic  panel  Result Value Ref Range   Sodium 135 135 - 145 mmol/L   Potassium 4.1 3.5 - 5.1 mmol/L   Chloride 97 96 - 112 mmol/L   CO2 29 19 - 32 mmol/L   Glucose, Bld 113 (H) 70 - 99 mg/dL   BUN 12 6 - 23 mg/dL   Creatinine, Ser 1.22 0.50 - 1.35 mg/dL   Calcium 8.3 (L) 8.4 - 10.5 mg/dL   GFR calc non Af Amer 57 (L) >90 mL/min   GFR calc Af Amer 67 (L) >90 mL/min   Anion gap 9 5 - 15  Protime-INR  Result Value Ref Range   Prothrombin Time 24.8 (H) 11.6 - 15.2 seconds   INR 2.22 (H) 0.00 - 1.49  CBC  Result Value Ref Range   WBC 7.8 4.0 - 10.5 K/uL   RBC 4.10 (L) 4.22 - 5.81 MIL/uL   Hemoglobin 12.5 (L) 13.0 - 17.0 g/dL   HCT 38.0 (L) 39.0 - 52.0 %   MCV 92.7 78.0 - 100.0 fL   MCH 30.5 26.0 - 34.0 pg   MCHC 32.9 30.0 - 36.0 g/dL  RDW 13.6 11.5 - 15.5 %   Platelets 145 (L) 150 - 400 K/uL    Discharge Medications:     Medication List    STOP taking these medications        ALEVE 220 MG tablet  Generic drug:  naproxen sodium     clindamycin 150 MG capsule  Commonly known as:  CLEOCIN     oxyCODONE-acetaminophen 5-325 MG per tablet  Commonly known as:  PERCOCET/ROXICET  Replaced by:  oxyCODONE-acetaminophen 7.5-325 MG per tablet      TAKE these medications        aspirin 81 MG tablet  Take 81 mg by mouth daily.     esomeprazole 40 MG capsule  Commonly known as:  NEXIUM  Take 40 mg by mouth daily as needed (acid reflux).     fenofibrate 145 MG tablet  Commonly known as:  TRICOR  Take 145 mg by mouth daily.     Fish Oil 1200 MG Caps  Take 1 capsule by mouth daily.     fluticasone 50 MCG/ACT nasal spray  Commonly known as:  FLONASE  Place 2 sprays into both nostrils daily as needed for allergies or rhinitis.     hydrocortisone 2.5 % rectal cream  Commonly known as:  ANUSOL-HC  Place 1 application rectally 2 (two) times daily.     losartan-hydrochlorothiazide 100-25 MG per tablet  Commonly known as:  HYZAAR  Take 1 tablet by mouth daily.     methocarbamol  500 MG tablet  Commonly known as:  ROBAXIN  Take 1 tablet (500 mg total) by mouth 3 (three) times daily as needed.     oxyCODONE-acetaminophen 7.5-325 MG per tablet  Commonly known as:  PERCOCET  Take 1 tablet by mouth every 4 (four) hours as needed for severe pain.     probenecid 500 MG tablet  Commonly known as:  BENEMID  Take 500 mg by mouth daily.     rosuvastatin 10 MG tablet  Commonly known as:  CRESTOR  Take 10 mg by mouth daily.     tadalafil 5 MG tablet  Commonly known as:  CIALIS  Take 5 mg by mouth daily.     testosterone 4 MG/24HR Pt24 patch  Commonly known as:  ANDRODERM  Place 1 patch onto the skin daily.     VITAMIN D PO  Take 6,000 Units by mouth daily.     warfarin 5 MG tablet  Commonly known as:  COUMADIN  Take 1 tablet (5 mg total) by mouth daily. Take as directed per the pharmacist for INR target from 2.5-3.0 for 30 days post op        Diagnostic Studies: Dg Knee Right Port  09/02/2014   CLINICAL DATA:  Status post total knee replacement  EXAM: PORTABLE RIGHT KNEE - 1-2 VIEW  COMPARISON:  None.  FINDINGS: Frontal and lateral views were obtained. The patient is status post total knee replacement with femoral and tibial components appearing well-seated. No fracture or dislocation. Air in the joint is an expected postoperative finding.  IMPRESSION: Prosthetic components appear well seated. No acute fracture or dislocation.   Electronically Signed   By: Lowella Grip III M.D.   On: 09/02/2014 10:51    Disposition: 01-Home or Self Care        Follow-up Information    Follow up with Breana Litts,STEVEN R, MD. Call in 2 weeks.   Specialty:  Orthopedic Surgery   Why:  [phone]   Contact information:  [address] [place] [address] [phone]        Signed: Augustin Schooling 09/04/2014, 8:19 AM

## 2014-09-04 NOTE — Progress Notes (Signed)
Patient and wife watching Coumadin video. Discharge instructions reviewed. Rx given to patient.

## 2014-09-04 NOTE — Progress Notes (Signed)
Patient ready for d/c home. Denies need for pain Rx. Wife has left with his belongings to get the car. Patient in w/c with Nursing Tech to take down to meet wife at [address] main entrance to drive home.

## 2014-09-04 NOTE — Progress Notes (Signed)
Physical Therapy Treatment Patient Details Name: CLAIBORNE STROBLE MRN: [id number] DOB: 12/12/1940 Today's Date: 09/04/2014    History of Present Illness 74 y.o. male admitted to [facility] on 09/02/14 for elective R TKA.  Pt with significant PMHx of gout, HTN, and L TKA.      PT Comments    Reinforced education re: positioning for optimal R knee extension; OK for dc home from PT standpoint   Follow Up Recommendations  Home health PT;Supervision for mobility/OOB     Equipment Recommendations  Rolling walker with 5" wheels    Recommendations for Other Services       Precautions / Restrictions Precautions Precautions: Knee Required Braces or Orthoses: Knee Immobilizer - Right Restrictions Weight Bearing Restrictions: Yes RLE Weight Bearing: Weight bearing as tolerated    Mobility  Bed Mobility                  Transfers Overall transfer level: Needs assistance Equipment used: Rolling walker (2 wheeled) Transfers: Sit to/from Stand Sit to Stand: Supervision         General transfer comment: Cues to self-monitor for R knee stability at initial stance; noted stood from bed without RW close  Ambulation/Gait Ambulation/Gait assistance: Supervision Ambulation Distance (Feet): 150 Feet Assistive device: Rolling walker (2 wheeled) Gait Pattern/deviations: Step-through pattern     General Gait Details: Cues for posture and to activate R quad for stance stability; no knee buckling noted   Stairs Stairs: Yes Stairs assistance: Supervision Stair Management: Two rails Number of Stairs: 3 General stair comments: good sequencing  Wheelchair Mobility    Modified Rankin (Stroke Patients Only)       Balance     Sitting balance-Leahy Scale: Good       Standing balance-Leahy Scale: Fair                      Cognition Arousal/Alertness: Awake/alert Behavior During Therapy: WFL for tasks assessed/performed Overall Cognitive Status: Within Functional Limits  for tasks assessed (slightly impulsive)                      Exercises      General Comments General comments (skin integrity, edema, etc.): Pt requested we defer therex, because he would like to dc soon      Pertinent Vitals/Pain Pain Assessment: 0-10 Pain Score: 7  Pain Location: R knee Pain Descriptors / Indicators: Aching;Discomfort;Grimacing Pain Intervention(s): Monitored during session (pt choosing not to have pain meds)    Home Living                      Prior Function            PT Goals (current goals can now be found in the care plan section) Acute Rehab PT Goals Patient Stated Goal: home PT Goal Formulation: With patient Time For Goal Achievement: 09/09/14 Potential to Achieve Goals: Good Progress towards PT goals: Progressing toward goals    Frequency  7X/week    PT Plan Current plan remains appropriate    Co-evaluation             End of Session   Activity Tolerance: Patient tolerated treatment well Patient left: with family/visitor present (Sitting EOBq)     Time: 0177-9390 PT Time Calculation (min) (ACUTE ONLY): 12 min  Charges:  $Gait Training: 8-22 mins  G Codes:      Roney Marion [facility] 09/04/2014, 11:16 AM  Roney Marion, [place] Pager [phone] Office [phone]

## 2014-09-04 NOTE — Progress Notes (Signed)
Orthopedics Progress Note  Subjective: I feel better today. I am ready to go home   Objective:  Filed Vitals:   09/04/14 0551  BP: 166/84  Pulse: 99  Temp: 98.2 F (36.8 C)  Resp: 18    General: Awake and alert  Musculoskeletal: right knee dressing changed.  Some bruising but minimal swelling Neurovascularly intact  Lab Results  Component Value Date   WBC 7.8 09/04/2014   HGB 12.5* 09/04/2014   HCT 38.0* 09/04/2014   MCV 92.7 09/04/2014   PLT 145* 09/04/2014       Component Value Date/Time   NA 135 09/03/2014 0620   K 4.1 09/03/2014 0620   CL 97 09/03/2014 0620   CO2 29 09/03/2014 0620   GLUCOSE 113* 09/03/2014 0620   BUN 12 09/03/2014 0620   CREATININE 1.22 09/03/2014 0620   CALCIUM 8.3* 09/03/2014 0620   GFRNONAA 57* 09/03/2014 0620   GFRAA 67* 09/03/2014 0620    Lab Results  Component Value Date   INR 2.22* 09/04/2014   INR 1.40 09/03/2014   INR 1.19 08/24/2014    Assessment/Plan: POD #2 s/p Procedure(s): RIGHT TOTAL KNEE ARTHROPLASTY Stable for discharge home. INR therapeutic. Home health PT, OT, RN for coumadin monitoring for 30 days. Rx in chart  [name]. [name], MD 09/04/2014 8:15 AM

## 2014-09-05 ENCOUNTER — Encounter (HOSPITAL_COMMUNITY): Payer: Self-pay | Admitting: Orthopedic Surgery

## 2014-09-05 DIAGNOSIS — I10 Essential (primary) hypertension: Secondary | ICD-10-CM | POA: Diagnosis not present

## 2014-09-05 DIAGNOSIS — M1990 Unspecified osteoarthritis, unspecified site: Secondary | ICD-10-CM | POA: Diagnosis not present

## 2014-09-05 DIAGNOSIS — Z7901 Long term (current) use of anticoagulants: Secondary | ICD-10-CM | POA: Diagnosis not present

## 2014-09-05 DIAGNOSIS — Z5181 Encounter for therapeutic drug level monitoring: Secondary | ICD-10-CM | POA: Diagnosis not present

## 2014-09-05 DIAGNOSIS — Z471 Aftercare following joint replacement surgery: Secondary | ICD-10-CM | POA: Diagnosis not present

## 2014-09-05 DIAGNOSIS — M109 Gout, unspecified: Secondary | ICD-10-CM | POA: Diagnosis not present

## 2014-09-06 DIAGNOSIS — Z5181 Encounter for therapeutic drug level monitoring: Secondary | ICD-10-CM | POA: Diagnosis not present

## 2014-09-06 DIAGNOSIS — Z471 Aftercare following joint replacement surgery: Secondary | ICD-10-CM | POA: Diagnosis not present

## 2014-09-06 DIAGNOSIS — M109 Gout, unspecified: Secondary | ICD-10-CM | POA: Diagnosis not present

## 2014-09-06 DIAGNOSIS — Z7901 Long term (current) use of anticoagulants: Secondary | ICD-10-CM | POA: Diagnosis not present

## 2014-09-06 DIAGNOSIS — M1990 Unspecified osteoarthritis, unspecified site: Secondary | ICD-10-CM | POA: Diagnosis not present

## 2014-09-06 DIAGNOSIS — I10 Essential (primary) hypertension: Secondary | ICD-10-CM | POA: Diagnosis not present

## 2014-09-08 DIAGNOSIS — Z7901 Long term (current) use of anticoagulants: Secondary | ICD-10-CM | POA: Diagnosis not present

## 2014-09-08 DIAGNOSIS — Z5181 Encounter for therapeutic drug level monitoring: Secondary | ICD-10-CM | POA: Diagnosis not present

## 2014-09-08 DIAGNOSIS — M1990 Unspecified osteoarthritis, unspecified site: Secondary | ICD-10-CM | POA: Diagnosis not present

## 2014-09-08 DIAGNOSIS — M109 Gout, unspecified: Secondary | ICD-10-CM | POA: Diagnosis not present

## 2014-09-08 DIAGNOSIS — Z471 Aftercare following joint replacement surgery: Secondary | ICD-10-CM | POA: Diagnosis not present

## 2014-09-08 DIAGNOSIS — I10 Essential (primary) hypertension: Secondary | ICD-10-CM | POA: Diagnosis not present

## 2014-09-09 DIAGNOSIS — M1990 Unspecified osteoarthritis, unspecified site: Secondary | ICD-10-CM | POA: Diagnosis not present

## 2014-09-09 DIAGNOSIS — Z7901 Long term (current) use of anticoagulants: Secondary | ICD-10-CM | POA: Diagnosis not present

## 2014-09-09 DIAGNOSIS — I10 Essential (primary) hypertension: Secondary | ICD-10-CM | POA: Diagnosis not present

## 2014-09-09 DIAGNOSIS — Z5181 Encounter for therapeutic drug level monitoring: Secondary | ICD-10-CM | POA: Diagnosis not present

## 2014-09-09 DIAGNOSIS — Z471 Aftercare following joint replacement surgery: Secondary | ICD-10-CM | POA: Diagnosis not present

## 2014-09-09 DIAGNOSIS — M109 Gout, unspecified: Secondary | ICD-10-CM | POA: Diagnosis not present

## 2014-09-11 DIAGNOSIS — Z471 Aftercare following joint replacement surgery: Secondary | ICD-10-CM | POA: Diagnosis not present

## 2014-09-11 DIAGNOSIS — M109 Gout, unspecified: Secondary | ICD-10-CM | POA: Diagnosis not present

## 2014-09-11 DIAGNOSIS — Z5181 Encounter for therapeutic drug level monitoring: Secondary | ICD-10-CM | POA: Diagnosis not present

## 2014-09-11 DIAGNOSIS — M1990 Unspecified osteoarthritis, unspecified site: Secondary | ICD-10-CM | POA: Diagnosis not present

## 2014-09-11 DIAGNOSIS — Z7901 Long term (current) use of anticoagulants: Secondary | ICD-10-CM | POA: Diagnosis not present

## 2014-09-11 DIAGNOSIS — I10 Essential (primary) hypertension: Secondary | ICD-10-CM | POA: Diagnosis not present

## 2014-09-12 DIAGNOSIS — Z471 Aftercare following joint replacement surgery: Secondary | ICD-10-CM | POA: Diagnosis not present

## 2014-09-12 DIAGNOSIS — R300 Dysuria: Secondary | ICD-10-CM | POA: Diagnosis not present

## 2014-09-12 DIAGNOSIS — M1711 Unilateral primary osteoarthritis, right knee: Secondary | ICD-10-CM | POA: Diagnosis not present

## 2014-09-14 DIAGNOSIS — M1711 Unilateral primary osteoarthritis, right knee: Secondary | ICD-10-CM | POA: Diagnosis not present

## 2014-09-15 DIAGNOSIS — Z96651 Presence of right artificial knee joint: Secondary | ICD-10-CM | POA: Diagnosis not present

## 2014-09-15 DIAGNOSIS — Z471 Aftercare following joint replacement surgery: Secondary | ICD-10-CM | POA: Diagnosis not present

## 2014-09-15 DIAGNOSIS — M1711 Unilateral primary osteoarthritis, right knee: Secondary | ICD-10-CM | POA: Diagnosis not present

## 2014-09-20 DIAGNOSIS — Z5181 Encounter for therapeutic drug level monitoring: Secondary | ICD-10-CM | POA: Diagnosis not present

## 2014-09-20 DIAGNOSIS — M1711 Unilateral primary osteoarthritis, right knee: Secondary | ICD-10-CM | POA: Diagnosis not present

## 2014-09-22 DIAGNOSIS — M1711 Unilateral primary osteoarthritis, right knee: Secondary | ICD-10-CM | POA: Diagnosis not present

## 2014-09-27 DIAGNOSIS — M1711 Unilateral primary osteoarthritis, right knee: Secondary | ICD-10-CM | POA: Diagnosis not present

## 2014-09-28 ENCOUNTER — Telehealth: Payer: Self-pay

## 2014-09-28 NOTE — Telephone Encounter (Signed)
CALLED PATIENT TO FIND OUT WHO THE PRESCRIBER OF THE COUMADIN IS. PT STATED HE TOOK HIS LAST DOSE TODAY. HE WAS ONLY TAKING IT FOR A KNEE REPLACEMENT THAT HE HAD.

## 2014-09-28 NOTE — Telephone Encounter (Signed)
Pt on Coumadin.  Wilber Bihari PA in March.  Wasn't addressed.  How long to hold?  Pt coming in tomorrow5/12/16.  Please advise.  Thank you, Angela/PV

## 2014-09-28 NOTE — Telephone Encounter (Signed)
We need to check with his PCP whether Coumadin can be held.  If it is permissible then I would hold it for 5 days prior to procedure.

## 2014-09-29 ENCOUNTER — Ambulatory Visit (AMBULATORY_SURGERY_CENTER): Payer: Self-pay | Admitting: *Deleted

## 2014-09-29 VITALS — Ht 70.0 in | Wt 212.2 lb

## 2014-09-29 DIAGNOSIS — M1711 Unilateral primary osteoarthritis, right knee: Secondary | ICD-10-CM | POA: Diagnosis not present

## 2014-09-29 DIAGNOSIS — Z86010 Personal history of colonic polyps: Secondary | ICD-10-CM

## 2014-09-29 MED ORDER — NA SULFATE-K SULFATE-MG SULF 17.5-3.13-1.6 GM/177ML PO SOLN: ORAL | Status: DC

## 2014-09-29 NOTE — Progress Notes (Signed)
No egg or soy allergy  No anesthesia or intubation problems per pt  No diet medications taken  Registered in [place]  Pt had knee replacement on 09-02-14 and took Coumadin until 09-28-14.  He is now off Coumadin

## 2014-10-04 DIAGNOSIS — M1711 Unilateral primary osteoarthritis, right knee: Secondary | ICD-10-CM | POA: Diagnosis not present

## 2014-10-06 DIAGNOSIS — M1711 Unilateral primary osteoarthritis, right knee: Secondary | ICD-10-CM | POA: Diagnosis not present

## 2014-10-10 DIAGNOSIS — Z85820 Personal history of malignant melanoma of skin: Secondary | ICD-10-CM | POA: Diagnosis not present

## 2014-10-10 DIAGNOSIS — L570 Actinic keratosis: Secondary | ICD-10-CM | POA: Diagnosis not present

## 2014-10-10 DIAGNOSIS — L814 Other melanin hyperpigmentation: Secondary | ICD-10-CM | POA: Diagnosis not present

## 2014-10-10 DIAGNOSIS — D225 Melanocytic nevi of trunk: Secondary | ICD-10-CM | POA: Diagnosis not present

## 2014-10-10 DIAGNOSIS — Z08 Encounter for follow-up examination after completed treatment for malignant neoplasm: Secondary | ICD-10-CM | POA: Diagnosis not present

## 2014-10-11 ENCOUNTER — Ambulatory Visit (AMBULATORY_SURGERY_CENTER): Payer: Medicare Other | Admitting: Gastroenterology

## 2014-10-11 ENCOUNTER — Encounter: Payer: Self-pay | Admitting: Gastroenterology

## 2014-10-11 VITALS — BP 111/72 | HR 84 | Temp 97.6°F | Resp 15 | Ht 70.0 in | Wt 212.0 lb

## 2014-10-11 DIAGNOSIS — Z86010 Personal history of colonic polyps: Secondary | ICD-10-CM | POA: Diagnosis not present

## 2014-10-11 DIAGNOSIS — I10 Essential (primary) hypertension: Secondary | ICD-10-CM | POA: Diagnosis not present

## 2014-10-11 DIAGNOSIS — K5730 Diverticulosis of large intestine without perforation or abscess without bleeding: Secondary | ICD-10-CM

## 2014-10-11 DIAGNOSIS — D120 Benign neoplasm of cecum: Secondary | ICD-10-CM | POA: Diagnosis not present

## 2014-10-11 DIAGNOSIS — I6529 Occlusion and stenosis of unspecified carotid artery: Secondary | ICD-10-CM | POA: Diagnosis not present

## 2014-10-11 DIAGNOSIS — G4730 Sleep apnea, unspecified: Secondary | ICD-10-CM | POA: Diagnosis not present

## 2014-10-11 MED ORDER — SODIUM CHLORIDE 0.9 % IV SOLN: 500.0000 mL | INTRAVENOUS | Status: DC

## 2014-10-11 NOTE — Patient Instructions (Signed)
YOU HAD AN ENDOSCOPIC PROCEDURE TODAY AT [facility]:   Refer to the procedure report that was given to you for any specific questions about what was found during the examination.  If the procedure report does not answer your questions, please call your gastroenterologist to clarify.  If you requested that your care partner not be given the details of your procedure findings, then the procedure report has been included in a sealed envelope for you to review at your convenience later.  YOU SHOULD EXPECT: Some feelings of bloating in the abdomen. Passage of more gas than usual.  Walking can help get rid of the air that was put into your GI tract during the procedure and reduce the bloating. If you had a lower endoscopy (such as a colonoscopy or flexible sigmoidoscopy) you may notice spotting of blood in your stool or on the toilet paper. If you underwent a bowel prep for your procedure, you may not have a normal bowel movement for a few days.  Please Note:  You might notice some irritation and congestion in your nose or some drainage.  This is from the oxygen used during your procedure.  There is no need for concern and it should clear up in a day or so.  SYMPTOMS TO REPORT IMMEDIATELY:   Following lower endoscopy (colonoscopy or flexible sigmoidoscopy):  Excessive amounts of blood in the stool  Significant tenderness or worsening of abdominal pains  Swelling of the abdomen that is new, acute  Fever of 100F or higher   For urgent or emergent issues, a gastroenterologist can be reached at any hour by calling [phone].   DIET: Your first meal following the procedure should be a small meal and then it is ok to progress to your normal diet. Heavy or fried foods are harder to digest and may make you feel nauseous or bloated.  Likewise, meals heavy in dairy and vegetables can increase bloating.  Drink plenty of fluids but you should avoid alcoholic beverages for 24 hours. Try to  increase the fiber in your diet.  ACTIVITY:  You should plan to take it easy for the rest of today and you should NOT DRIVE or use heavy machinery until tomorrow (because of the sedation medicines used during the test).    FOLLOW UP: Our staff will call the number listed on your records the next business day following your procedure to check on you and address any questions or concerns that you may have regarding the information given to you following your procedure. If we do not reach you, we will leave a message.  However, if you are feeling well and you are not experiencing any problems, there is no need to return our call.  We will assume that you have returned to your regular daily activities without incident.  If any biopsies were taken you will be contacted by phone or by letter within the next 1-3 weeks.  Please call us at [phone] if you have not heard about the biopsies in 3 weeks.    SIGNATURES/CONFIDENTIALITY: You and/or your care partner have signed paperwork which will be entered into your electronic medical record.  These signatures attest to the fact that that the information above on your After Visit Summary has been reviewed and is understood.  Full responsibility of the confidentiality of this discharge information lies with you and/or your care-partner.  HOLD ALL ASPIRIN AND NSAIDS FOR TWO WEEKS TO PREVENT BLEEDING.

## 2014-10-11 NOTE — Op Note (Addendum)
[facility]  [address] [place], [address]   COLONOSCOPY PROCEDURE REPORT  PATIENT: [name], [name]  MR#: [id number] BIRTHDATE: 1940/07/24 , 74  yrs. old GENDER: male ENDOSCOPIST: Inda Castle, MD REFERRED ZW:CHEN Virgina Jock, M.D. PROCEDURE DATE:  10/11/2014 PROCEDURE:   Colonoscopy, screening, Colonoscopy with cold biopsy polypectomy, Colonoscopy with snare polypectomy, and Submucosal injection, any substance First Screening Colonoscopy - Avg.  risk and is 50 yrs.  old or older - No.  Prior Negative Screening - Now for repeat screening. N/A  History of Adenoma - Now for follow-up colonoscopy & has been > or = to 3 yrs.  Yes hx of adenoma.  Has been 3 or more years since last colonoscopy.  Polyps removed today? Yes ASA CLASS:   Class II INDICATIONS:PH Colon Adenoma. 2011 MEDICATIONS: Monitored anesthesia care and Propofol 250 mg IV  DESCRIPTION OF PROCEDURE:   After the risks benefits and alternatives of the procedure were thoroughly explained, informed consent was obtained.  The digital rectal exam revealed no abnormalities of the rectum.   The LB [id number]  endoscope was introduced through the anus and advanced to the cecum, which was identified by both the appendix and ileocecal valve. No adverse events experienced.   The quality of the prep was (Suprep was used) good.  The instrument was then slowly withdrawn as the colon was fully examined. Estimated blood loss is zero unless otherwise noted in this procedure report.      COLON FINDINGS: There was moderate diverticulosis noted in the descending colon and sigmoid colon.   A sessile polyp measuring 40 mm in size was found at the cecum.  A saline Injection 5cc was given to lift the mucosal wall.  A polypectomy was performed using snare cautery.  The resection was complete, the polyp tissue was completely retrieved and sent to histology.  3 hemoclips were placed to close the mucosal gap and  minimize risk for bleeding.   A sessile polyp measuring 2 mm in size was found at the cecum.  A polypectomy was performed with cold forceps. Internal hemorrhoids were seen.  Retroflexed views revealed no abnormalities. The time to cecum = 3.2 Withdrawal time = 20.4   The scope was withdrawn and the procedure completed. COMPLICATIONS: There were no immediate complications.  ENDOSCOPIC IMPRESSION: 1.   Moderate diverticulosis was noted in the descending colon and sigmoid colon 2.   Sessile polyp was found at the cecum; saline was given to lift the mucosal wall.; polypectomy was performed using snare cautery; by placing hemoclips 3.   Sessile polyp was found at the cecum; polypectomy was performed with cold forceps 4.  Internal hemorrhoids  RECOMMENDATIONS: 1.  Hold Aspirin and all other NSAIDS for 2 weeks. 2.  Colonoscopy  1 year  eSigned:  Inda Castle, MD 10/11/2014 9:56 AM Revised: 10/11/2014 9:56 AM  cc:   PATIENT NAME:  [name], [name] MR#: [id number]

## 2014-10-11 NOTE — Progress Notes (Signed)
Called to room to assist during endoscopic procedure.  Patient ID and intended procedure confirmed with present staff. Received instructions for my participation in the procedure from the performing physician.  

## 2014-10-12 ENCOUNTER — Telehealth: Payer: Self-pay

## 2014-10-12 NOTE — Telephone Encounter (Signed)
Left message on answering machine. 

## 2014-10-20 ENCOUNTER — Encounter: Payer: Self-pay | Admitting: Gastroenterology

## 2014-10-20 DIAGNOSIS — Z471 Aftercare following joint replacement surgery: Secondary | ICD-10-CM | POA: Diagnosis not present

## 2014-10-20 DIAGNOSIS — Z5181 Encounter for therapeutic drug level monitoring: Secondary | ICD-10-CM | POA: Diagnosis not present

## 2014-10-20 DIAGNOSIS — Z96651 Presence of right artificial knee joint: Secondary | ICD-10-CM | POA: Diagnosis not present

## 2014-10-24 DIAGNOSIS — Z85819 Personal history of malignant neoplasm of unspecified site of lip, oral cavity, and pharynx: Secondary | ICD-10-CM | POA: Diagnosis not present

## 2014-10-28 ENCOUNTER — Encounter: Payer: Self-pay | Admitting: Family

## 2014-11-01 ENCOUNTER — Encounter: Payer: Self-pay | Admitting: Family

## 2014-11-01 ENCOUNTER — Ambulatory Visit (INDEPENDENT_AMBULATORY_CARE_PROVIDER_SITE_OTHER): Payer: Medicare Other | Admitting: Family

## 2014-11-01 ENCOUNTER — Ambulatory Visit (HOSPITAL_COMMUNITY)
Admission: RE | Admit: 2014-11-01 | Discharge: 2014-11-01 | Disposition: A | Discharge status: Home / Self Care | Payer: Medicare Other | Source: Ambulatory Visit | Attending: Family | Admitting: Family

## 2014-11-01 VITALS — BP 89/85 | HR 101 | Resp 16 | Ht 70.0 in | Wt 210.0 lb

## 2014-11-01 DIAGNOSIS — Z9889 Other specified postprocedural states: Secondary | ICD-10-CM

## 2014-11-01 DIAGNOSIS — Z48812 Encounter for surgical aftercare following surgery on the circulatory system: Secondary | ICD-10-CM | POA: Insufficient documentation

## 2014-11-01 DIAGNOSIS — I6522 Occlusion and stenosis of left carotid artery: Secondary | ICD-10-CM | POA: Diagnosis not present

## 2014-11-01 NOTE — Patient Instructions (Signed)
Stroke Prevention Some medical conditions and behaviors are associated with an increased chance of having a stroke. You may prevent a stroke by making healthy choices and managing medical conditions. HOW CAN I REDUCE MY RISK OF HAVING A STROKE?   Stay physically active. Get at least 30 minutes of activity on most or all days.  Do not smoke. It may also be helpful to avoid exposure to secondhand smoke.  Limit alcohol use. Moderate alcohol use is considered to be:  No more than 2 drinks per day for men.  No more than 1 drink per day for nonpregnant women.  Eat healthy foods. This involves:  Eating 5 or more servings of fruits and vegetables a day.  Making dietary changes that address high blood pressure (hypertension), high cholesterol, diabetes, or obesity.  Manage your cholesterol levels.  Making food choices that are high in fiber and low in saturated fat, trans fat, and cholesterol may control cholesterol levels.  Take any prescribed medicines to control cholesterol as directed by your health care provider.  Manage your diabetes.  Controlling your carbohydrate and sugar intake is recommended to manage diabetes.  Take any prescribed medicines to control diabetes as directed by your health care provider.  Control your hypertension.  Making food choices that are low in salt (sodium), saturated fat, trans fat, and cholesterol is recommended to manage hypertension.  Take any prescribed medicines to control hypertension as directed by your health care provider.  Maintain a healthy weight.  Reducing calorie intake and making food choices that are low in sodium, saturated fat, trans fat, and cholesterol are recommended to manage weight.  Stop drug abuse.  Avoid taking birth control pills.  Talk to your health care provider about the risks of taking birth control pills if you are over 35 years old, smoke, get migraines, or have ever had a blood clot.  Get evaluated for sleep  disorders (sleep apnea).  Talk to your health care provider about getting a sleep evaluation if you snore a lot or have excessive sleepiness.  Take medicines only as directed by your health care provider.  For some people, aspirin or blood thinners (anticoagulants) are helpful in reducing the risk of forming abnormal blood clots that can lead to stroke. If you have the irregular heart rhythm of atrial fibrillation, you should be on a blood thinner unless there is a good reason you cannot take them.  Understand all your medicine instructions.  Make sure that other conditions (such as anemia or atherosclerosis) are addressed. SEEK IMMEDIATE MEDICAL CARE IF:   You have sudden weakness or numbness of the face, arm, or leg, especially on one side of the body.  Your face or eyelid droops to one side.  You have sudden confusion.  You have trouble speaking (aphasia) or understanding.  You have sudden trouble seeing in one or both eyes.  You have sudden trouble walking.  You have dizziness.  You have a loss of balance or coordination.  You have a sudden, severe headache with no known cause.  You have new chest pain or an irregular heartbeat. Any of these symptoms may represent a serious problem that is an emergency. Do not wait to see if the symptoms will go away. Get medical help at once. Call your local emergency services (911 in [place] Do not drive yourself to the hospital. Document Released: 06/13/2004 Document Revised: 09/20/2013 Document Reviewed: 11/06/2012 [employer] Patient Information 2015 [employer]. This information is not intended to replace advice given   to you by your health care provider. Make sure you discuss any questions you have with your health care provider.  

## 2014-11-01 NOTE — Progress Notes (Signed)
Established Carotid Patient   History of Present Illness  [name] is a 74 y.o. male  patient of Dr. Donnetta Hutching who is status post right carotid endarterectomy on 10/01/2013 for severe (80% stenosis) asymptomatic disease.  He returns today for follow up.  The patient denies any history of TIA or stroke symptoms, specifically the patient denies a history of amaurosis fugax or monocular blindness, denies a history unilateral of facial drooping, denies a history of hemiplegia, and denies a history of receptive or expressive aphasia.   The patient states he has "a pinched nerve" in his back and has pain in both of his hips from this, sees [facility] for this. Pt states his right knee is a candidate for replacement and his right shoulder sustained an injury in the past and he receives injections in the shoulder and knee for pain.  He is physically active. He scuba dives.  The patient reports New Medical or Surgical History: right knee total replacement April 2016.  Pt Diabetic: No Pt smoker: smoker (couple of cigars daily, started smoking in his 20's, states he has no intention of quitting) Pt states he consumes about 14 ETOH drinks/week  He is trying to lose weight, states at his heaviest he was 240 pounds.   Pt meds include: Statin : Yes ASA: Yes Other anticoagulants/antiplatelets: no    Past Medical History  Diagnosis Date  . GERD (gastroesophageal reflux disease)   . Hypertension   . Colon polyps   . Prostate hypertrophy   . Arthritis   . Skin cancer   . Complication of anesthesia     after surgery 07-2013 unable to sleep for 3 days  . Allergy   . Cataract     having cataract surgery July 2016  . Sleep apnea     had suegery in 2000 for sleep apnea, no CPAP needed  . Hyperlipidemia   . Chronic kidney disease     hx kidney stones    Social History History  Substance Use Topics  . Smoking status: Current Every Day Smoker -- 50 years    Types: Cigars   . Smokeless tobacco: Never Used     Comment: pt states he smokes a couple of cigars per day  . Alcohol Use: 12.6 oz/week    7 Glasses of wine, 14 Shots of liquor per week    Family History Family History  Problem Relation Age of Onset  . Diverticulosis Mother   . Skin cancer Mother   . Varicose Veins Mother   . Hyperlipidemia Father   . Heart disease Father   . Heart attack Father   . Colon cancer Neg Hx   . Esophageal cancer Neg Hx   . Rectal cancer Neg Hx   . Stomach cancer Neg Hx     Surgical History Past Surgical History  Procedure Laterality Date  . Replacement total knee Left   . 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 Removal of melonomia      shoulder  . Sleep apnea surgery    . Rhinoplasty      1999  . Endarterectomy Right 10/01/2013    Procedure: ENDARTERECTOMY CAROTID-RIGHT;  Surgeon: Rosetta Posner, MD;  Location: [facility];  Service: Vascular;  Laterality: Right;  . Patch angioplasty Right 10/01/2013    Procedure: PATCH ANGIOPLASTY USING HEMASHIELD FINESSE PATCH;  Surgeon: Rosetta Posner, MD;  Location: [facility];  Service:  Vascular;  Laterality: Right;  . Esophagogastroduodenoscopy endoscopy  10-12-2013  . Carotid endarterectomy Right Oct 01, 2013    CE  . Rhinoplasty    . Total knee arthroplasty Right 09/02/2014    Procedure: RIGHT TOTAL KNEE ARTHROPLASTY;  Surgeon: Netta Cedars, MD;  Location: [facility];  Service: Orthopedics;  Laterality: Right;  . Colonoscopy    . Skin cancer excision      squamous cell cancer under tongue  . Joint replacement Right September 02, 2014    Knee  . Joint replacement Left 2012    Knee    Allergies  Allergen Reactions  . Cefaclor Other (See Comments)     skin split  . Penicillins Rash    Current Outpatient Prescriptions  Medication Sig Dispense Refill  . aspirin 81 MG tablet Take 81 mg by mouth daily.    . Cholecalciferol (VITAMIN D PO) Take 6,000 Units by mouth  daily.    Marland Kitchen esomeprazole (NEXIUM) 40 MG capsule Take 40 mg by mouth daily as needed (acid reflux).     . fenofibrate (TRICOR) 145 MG tablet Take 145 mg by mouth daily.    . fluticasone (FLONASE) 50 MCG/ACT nasal spray Place 2 sprays into both nostrils daily as needed for allergies or rhinitis.    . hydrocortisone (ANUSOL-HC) 2.5 % rectal cream Place 1 application rectally 2 (two) times daily. 30 g 1  . losartan-hydrochlorothiazide (HYZAAR) 100-25 MG per tablet Take 1 tablet by mouth daily.    . methocarbamol (ROBAXIN) 500 MG tablet Take 1 tablet (500 mg total) by mouth 3 (three) times daily as needed. 60 tablet 1  . Omega-3 Fatty Acids (FISH OIL) 1200 MG CAPS Take 1 capsule by mouth daily.     . probenecid (BENEMID) 500 MG tablet Take 500 mg by mouth daily.    . rosuvastatin (CRESTOR) 10 MG tablet Take 10 mg by mouth daily.    . tadalafil (CIALIS) 5 MG tablet Take 5 mg by mouth daily.    Marland Kitchen testosterone (ANDRODERM) 4 MG/24HR PT24 patch Place 1 patch onto the skin daily.     No current facility-administered medications for this visit.    Review of Systems : See HPI for pertinent positives and negatives.  Physical Examination  Filed Vitals:   11/01/14 0943 11/01/14 0946  BP: 102/73 89/85  Pulse: 99 101  Resp:  16  Height:  5\' 10"  (1.778 m)  Weight:  210 lb (95.255 kg)  SpO2:  96%   Body mass index is 30.13 kg/(m^2).  General: WDWN obese male in NAD GAIT: normal Eyes: PERRLA Pulmonary: Non-labored, CTAB, Negative Rales, Negative rhonchi, & Negative wheezing.  Cardiac: regular Rhythm, no detected murmur.  VASCULAR EXAM Carotid Bruits Right Left   Negative Negative   Aorta is not palpable. Radial pulses: right is 2+ palpable, left is faintly palpable but left brachial is 2+ palpable.      LE Pulses Right Left   POPLITEAL not  palpable  not palpable   POSTERIOR TIBIAL not palpable   palpable    DORSALIS PEDIS  ANTERIOR TIBIAL palpable  palpable     Gastrointestinal: soft, nontender, BS WNL, no r/g, no palpable masses.  Musculoskeletal: Negative muscle atrophy/wasting. M/S 5/5 throughout, extremities without ischemic changes.  Neurologic: A&O X 3; Appropriate Affect, Speech is normal CN 2-12 intact, pain and light touch intact in extremities, motor exam as listed above.          Non-Invasive Vascular Imaging CAROTID DUPLEX 11/01/2014  CEREBROVASCULAR DUPLEX EVALUATION    INDICATION: Carotid endarterectomy     PREVIOUS INTERVENTION(S): Right carotid endarterectomy on 10/01/13    DUPLEX EXAM:     RIGHT  LEFT  Peak Systolic Velocities (cm/s) End Diastolic Velocities (cm/s) Plaque LOCATION Peak Systolic Velocities (cm/s) End Diastolic Velocities (cm/s) Plaque  74 14  CCA PROXIMAL 97 15   90 19  CCA MID 89 15   79 17 HM CCA DISTAL 58 13   80 0  ECA 47 7 CP  68 25  ICA PROXIMAL 137 42 CP  70 27  ICA MID 128 32   49 18  ICA DISTAL 76 21     Not Calculated ICA / CCA Ratio (PSV) 2.4  Antegrade Vertebral Flow Antegrade   Brachial Systolic Pressure (mmHg)   Multiphasic (subclavian artery) Brachial Artery Waveforms Multiphasic (subclavian artery)    Plaque Morphology:  HM = Homogeneous, HT = Heterogeneous, CP = Calcific Plaque, SP = Smooth Plaque, IP = Irregular Plaque     ADDITIONAL FINDINGS: No significant stenosis of the bilateral external or common carotid arteries.    IMPRESSION: 1. Patent right carotid endarterectomy site with no right internal carotid artery stenosis. 2. Doppler velocities suggest a 50-69% stenosis of the left proximal internal carotid artery.    Compared to the previous exam:  No significant change in the bilateral internal carotid artery velocities noted when compared to the previous exam on 04/26/14.        Assessment: [name] is a  74 y.o. male who is status post right carotid endarterectomy on 10/01/2013 for severe (80% stenosis) asymptomatic disease. He has no history of stroke or TIA. Today's carotid Duplex suggests a patent right carotid endarterectomy site with no right internal carotid artery stenosis and  50-69% stenosis of the left proximal internal carotid artery. No significant change in the bilateral internal carotid artery velocities noted when compared to the previous exam on 04/26/14.   Plan: Follow-up in 6 months with Carotid Duplex.   I discussed in depth with the patient the nature of atherosclerosis, and emphasized the importance of maximal medical management including strict control of blood pressure, blood glucose, and lipid levels, obtaining regular exercise, and cessation of smoking.  The patient is aware that without maximal medical management the underlying atherosclerotic disease process will progress, limiting the benefit of any interventions. The patient was given information about stroke prevention and what symptoms should prompt the patient to seek immediate medical care. Thank you for allowing [place] to participate in this patient's care.  Clemon Chambers, RN, MSN, FNP-C [facility] Office: [phone]  Clinic Physician: Early  11/01/2014 10:12 AM   VASCULAR QUALITY INITIATIVE FOLLOW UP DATA:  Current smoker: Valu.Nieves  ] yes  [  ] no  Living status: [ X ]  Home  [  ] Nursing home  [  ] Homeless    MEDS:  ASA Valu.Nieves  ] yes  [  ] no- [  ] medical reason  [  ] non compliant  STATIN  [ X ] yes  [  ] no- [  ] medical reason  [  ] non compliant  Beta blocker [  ] yes  Valu.Nieves  ] no- Valu.Nieves  ] medical reason  [  ] non compliant  ACE inhibitor [  ] yes  Valu.Nieves  ] no- Valu.Nieves  ] medical reason  [  ] non compliant  P2Y12 Antagonist Valu.Nieves  ] none  [  ]  clopidogrel-Plavix  [  ] ticlopidine-Ticlid   [  ] prasugrel-Effient  [  ] ticagrelor- Brilinta    Anticoagulant [ X ] None  [  ] warfarin   [  ] rivaroxaban-Xarelto [  ] dabigatran- Pradaxa  Neurologic event since D/C:  Valu.Nieves  ] no  [  ] yes: [  ] eye event  [  ] cortical event  [  ] VB event  [  ] non specific event  [  ] right  [  ] left  [  ] TIA  [  ] stroke  Date:   Modified Rankin Score: 0

## 2014-11-14 ENCOUNTER — Other Ambulatory Visit: Payer: Self-pay

## 2014-11-29 DIAGNOSIS — L820 Inflamed seborrheic keratosis: Secondary | ICD-10-CM | POA: Diagnosis not present

## 2014-11-29 DIAGNOSIS — L570 Actinic keratosis: Secondary | ICD-10-CM | POA: Diagnosis not present

## 2014-12-08 DIAGNOSIS — H25812 Combined forms of age-related cataract, left eye: Secondary | ICD-10-CM | POA: Diagnosis not present

## 2014-12-08 DIAGNOSIS — H2512 Age-related nuclear cataract, left eye: Secondary | ICD-10-CM | POA: Diagnosis not present

## 2014-12-19 DIAGNOSIS — I2699 Other pulmonary embolism without acute cor pulmonale: Secondary | ICD-10-CM

## 2014-12-19 HISTORY — DX: Other pulmonary embolism without acute cor pulmonale: I26.99

## 2014-12-27 DIAGNOSIS — I2699 Other pulmonary embolism without acute cor pulmonale: Secondary | ICD-10-CM | POA: Diagnosis not present

## 2014-12-27 DIAGNOSIS — R042 Hemoptysis: Secondary | ICD-10-CM | POA: Diagnosis not present

## 2014-12-27 DIAGNOSIS — R0781 Pleurodynia: Secondary | ICD-10-CM | POA: Diagnosis not present

## 2014-12-27 DIAGNOSIS — J984 Other disorders of lung: Secondary | ICD-10-CM | POA: Diagnosis not present

## 2014-12-27 DIAGNOSIS — F17210 Nicotine dependence, cigarettes, uncomplicated: Secondary | ICD-10-CM | POA: Diagnosis not present

## 2014-12-27 DIAGNOSIS — F17200 Nicotine dependence, unspecified, uncomplicated: Secondary | ICD-10-CM | POA: Diagnosis not present

## 2014-12-27 DIAGNOSIS — R0602 Shortness of breath: Secondary | ICD-10-CM | POA: Diagnosis not present

## 2014-12-27 DIAGNOSIS — J9811 Atelectasis: Secondary | ICD-10-CM | POA: Diagnosis not present

## 2014-12-27 DIAGNOSIS — Z6831 Body mass index (BMI) 31.0-31.9, adult: Secondary | ICD-10-CM | POA: Diagnosis not present

## 2014-12-27 DIAGNOSIS — J90 Pleural effusion, not elsewhere classified: Secondary | ICD-10-CM | POA: Diagnosis not present

## 2015-01-03 ENCOUNTER — Other Ambulatory Visit (HOSPITAL_COMMUNITY): Payer: Self-pay | Admitting: Internal Medicine

## 2015-01-03 DIAGNOSIS — K117 Disturbances of salivary secretion: Secondary | ICD-10-CM | POA: Diagnosis not present

## 2015-01-03 DIAGNOSIS — I2699 Other pulmonary embolism without acute cor pulmonale: Secondary | ICD-10-CM

## 2015-01-03 DIAGNOSIS — Z85819 Personal history of malignant neoplasm of unspecified site of lip, oral cavity, and pharynx: Secondary | ICD-10-CM | POA: Diagnosis not present

## 2015-01-03 DIAGNOSIS — J310 Chronic rhinitis: Secondary | ICD-10-CM | POA: Diagnosis not present

## 2015-01-04 ENCOUNTER — Encounter: Payer: Self-pay | Admitting: Internal Medicine

## 2015-01-04 ENCOUNTER — Ambulatory Visit (INDEPENDENT_AMBULATORY_CARE_PROVIDER_SITE_OTHER): Payer: Medicare Other | Admitting: Internal Medicine

## 2015-01-04 VITALS — BP 114/72 | HR 97 | Ht 70.0 in | Wt 208.0 lb

## 2015-01-04 DIAGNOSIS — I2692 Saddle embolus of pulmonary artery without acute cor pulmonale: Secondary | ICD-10-CM

## 2015-01-04 DIAGNOSIS — I2699 Other pulmonary embolism without acute cor pulmonale: Secondary | ICD-10-CM | POA: Diagnosis not present

## 2015-01-04 DIAGNOSIS — I6522 Occlusion and stenosis of left carotid artery: Secondary | ICD-10-CM | POA: Diagnosis not present

## 2015-01-04 NOTE — Patient Instructions (Addendum)
ICD-9-CM ICD-10-CM   1. 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    I think you are improved but still have residual clost Continue Eliquis Daily - likely might need it for a few years Do Bilateral Duplex Venous Lower Extremity - LLE edema and PE   - will call with results Be careful about bleeding risk with falls and diving Be careful of activities that increase her clot risk such as being sedentary too long Try to quit smoking STop testosterone patch - communciated post visit  Plan - We'll call with results of Doppler - Return November 2016   - Reassess symptoms at that time  - Rediscuss scuba diving at the time of follow-up  - Any worsening please call us immediately

## 2015-01-04 NOTE — Progress Notes (Addendum)
Subjective:    Patient ID: [name], male    DOB: 1941/04/04, 74 y.o.   MRN: [id number]  HPI    IOV 01/04/2015  Chief Complaint  Patient presents with  . Pulmonary Consult    Pt referred by Dr. Shon Baton for PE. Pt denies SOB and CP/tightness. Pt c/o heomptysis, decreasing in blood volume that pt is coughing up.  Pt states the hemoptysis started on 8.8.16 with pain in right shoulder.     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 He had negative cardiac stress test in March 2016. In April 2016 had right total knee replacement. After that he has been pretty active. He denies any long trips. Around first week of August 2016 noticed left calf swelling that then improved. Then on 12/26/2014 started having right acute scapular pain that was pleuritic and severe associated with hemoptysis. This is per his history and also per review of referring physician's chart which echo the same information. Physical exam at primary care physician noticed bibasal crackles. He was subjected to CT angiography that showed bilateral saddle emboli. I do not have the visual images with me but I do have the report. The CT scan was done 12/27/2014 at [facility] on [address] [place]. Reports says but large bilateral pulmonary emboli in the distal main pulmonary arteries as well as lobar arteries. There is associated scarring and atelectasis. This opacity in the right middle lobe which could represent lung infarct or residual scarring. He was then started on ELIQUIS herapy as an outpatient. He was never on oxygen therapy. He was never hospitalized. Since then he significantly improved. Hemoptysis is resolved. Right infrascapular pain is also improved significantly. Currently he is feeling fine and is actively working. He denies any syncope orany problems. 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 Testosterone patch      has a past medical history of GERD (gastroesophageal reflux disease); Hypertension; Colon polyps; Prostate hypertrophy; Arthritis; Skin cancer; Complication of anesthesia; Allergy; Cataract; Sleep apnea; Hyperlipidemia; and Chronic kidney disease.   reports that he has been smoking Cigars.  He has never used smokeless tobacco.  Past Surgical History  Procedure Laterality Date  . Replacement total knee Left   . 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 Removal of melonomia      shoulder  . Sleep apnea surgery    . Rhinoplasty      1999  . Endarterectomy Right 10/01/2013    Procedure: ENDARTERECTOMY CAROTID-RIGHT;  Surgeon: Rosetta Posner, MD;  Location: [facility];  Service: Vascular;  Laterality: Right;  . Patch angioplasty Right 10/01/2013    Procedure: PATCH ANGIOPLASTY USING HEMASHIELD FINESSE PATCH;  Surgeon: Rosetta Posner, MD;  Location: [facility];  Service: Vascular;  Laterality: Right;  . Esophagogastroduodenoscopy endoscopy  10-12-2013  . Carotid endarterectomy Right Oct 01, 2013    CE  . Rhinoplasty    . Total knee arthroplasty Right 09/02/2014    Procedure: RIGHT TOTAL KNEE ARTHROPLASTY;  Surgeon: Netta Cedars, MD;  Location: [facility];  Service: Orthopedics;  Laterality: Right;  . Colonoscopy    . Skin cancer excision      squamous cell cancer under tongue  . Joint replacement Right September 02, 2014  Knee  . Joint replacement Left 2012    Knee    Allergies  Allergen Reactions  . Cefaclor Other (See Comments)     skin split  . Penicillins Rash    Immunization History  Administered Date(s) Administered  . Influenza Split 02/17/2014  . Pneumococcal-Unspecified 05/20/2012  . Zoster 05/20/2012    Family History  Problem Relation Age of Onset  . Diverticulosis Mother   . Skin cancer Mother   . Varicose Veins Mother   . Hyperlipidemia Father   .  Heart disease Father   . Heart attack Father   . Colon cancer Neg Hx   . Esophageal cancer Neg Hx   . Rectal cancer Neg Hx   . Stomach cancer Neg Hx      Current outpatient prescriptions:  .  apixaban (ELIQUIS) 5 MG TABS tablet, Take 5 mg by mouth 2 (two) times daily., Disp: , Rfl:  .  Cholecalciferol (VITAMIN D PO), Take 6,000 Units by mouth daily., Disp: , Rfl:  .  esomeprazole (NEXIUM) 40 MG capsule, Take 40 mg by mouth daily as needed (acid reflux). , Disp: , Rfl:  .  fenofibrate (TRICOR) 145 MG tablet, Take 145 mg by mouth daily., Disp: , Rfl:  .  fluticasone (FLONASE) 50 MCG/ACT nasal spray, Place 2 sprays into both nostrils daily as needed for allergies or rhinitis., Disp: , Rfl:  .  losartan-hydrochlorothiazide (HYZAAR) 100-25 MG per tablet, Take 1 tablet by mouth daily., Disp: , Rfl:  .  probenecid (BENEMID) 500 MG tablet, Take 500 mg by mouth daily., Disp: , Rfl:  .  rosuvastatin (CRESTOR) 10 MG tablet, Take 10 mg by mouth daily., Disp: , Rfl:  .  tadalafil (CIALIS) 5 MG tablet, Take 5 mg by mouth daily., Disp: , Rfl:  .  testosterone (ANDRODERM) 4 MG/24HR PT24 patch, Place 1 patch onto the skin daily., Disp: , Rfl:     Review of Systems  Constitutional: Negative for fever and unexpected weight change.  HENT: Negative for congestion, dental problem, ear pain, nosebleeds, postnasal drip, rhinorrhea, sinus pressure, sneezing, sore throat and trouble swallowing.   Eyes: Negative for redness and itching.  Respiratory: Positive for cough. Negative for chest tightness, shortness of breath and wheezing.   Cardiovascular: Negative for palpitations and leg swelling.  Gastrointestinal: Negative for nausea and vomiting.  Genitourinary: Negative for dysuria.  Musculoskeletal: Negative for joint swelling.  Skin: Negative for rash.  Neurological: Negative for headaches.  Hematological: Does not bruise/bleed easily.  Psychiatric/Behavioral: Negative for dysphoric mood. The patient is  not nervous/anxious.        Objective:   Physical Exam  Constitutional: He is oriented to person, place, and time. He appears well-developed and well-nourished. No distress.  Body mass index is 29.84 kg/(m^2).   HENT:  Head: Normocephalic and atraumatic.  Right Ear: External ear normal.  Left Ear: External ear normal.  Mouth/Throat: Oropharynx is clear and moist. No oropharyngeal exudate.  Eyes: Conjunctivae and EOM are normal. Pupils are equal, round, and reactive to light. Right eye exhibits no discharge. Left eye exhibits no discharge. No scleral icterus.  Neck: Normal range of motion. Neck supple. No JVD present. No tracheal deviation present. No thyromegaly present.  Cardiovascular: Normal rate, regular rhythm and intact distal pulses.  Exam reveals no gallop and no friction rub.   No murmur heard. Pulmonary/Chest: Effort normal and breath sounds normal. No respiratory distress. He has no wheezes. He has no rales. He exhibits no tenderness.  Abdominal:  Soft. Bowel sounds are normal. He exhibits no distension and no mass. There is no tenderness. There is no rebound and no guarding.  Musculoskeletal: Normal range of motion. He exhibits no edema or tenderness.  Left calf more prominent and warm Bilateral varicose veins present  Lymphadenopathy:    He has no cervical adenopathy.  Neurological: He is alert and oriented to person, place, and time. He has normal reflexes. No cranial nerve deficit. Coordination normal.  Skin: Skin is warm and dry. No rash noted. He is not diaphoretic. No erythema. No pallor.  To my superficial exam I could not see any evidence of skin cancer   Psychiatric: He has a normal mood and affect. His behavior is normal. Judgment and thought content normal.  Nursing note and vitals reviewed.   Filed Vitals:   01/04/15 1505  BP: 114/72  Pulse: 97  Height: 5\' 10"  (1.778 m)  Weight: 208 lb (94.348 kg)  SpO2: 94%          Assessment & Plan:      ICD-9-CM ICD-10-CM   1. 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 Cancer recurrence - cl;inically none - pcp to address this  - unknown  - testoterone patch - communicated post visit to stop the patch   Continue Eliquis Daily - likely might need it for a few years Do Bilateral Duplex Venous Lower Extremity - LLE edema and PE   - will call with results Be careful about bleeding risk with falls and diving Be careful of activities that increase her clot risk such as being sedentary too long Try to quit smoking  Plan - We'll call with results of Doppler - Return November 2016   - Reassess symptoms at that time  - Rediscuss scuba diving at the time of follow-up  - Any worsening please call us immediately    Dr. Brand Males, M.D., [facility].C.P Pulmonary and Critical Care Medicine Staff Physician [facility] Pager: [phone], If no answer or between  15:00h - 7:00h: call [phone]  01/04/2015 4:12 PM

## 2015-01-05 ENCOUNTER — Ambulatory Visit (HOSPITAL_COMMUNITY): Payer: Medicare Other | Attending: Cardiology

## 2015-01-05 ENCOUNTER — Other Ambulatory Visit: Payer: Self-pay

## 2015-01-05 ENCOUNTER — Telehealth: Payer: Self-pay | Admitting: Internal Medicine

## 2015-01-05 DIAGNOSIS — Z8249 Family history of ischemic heart disease and other diseases of the circulatory system: Secondary | ICD-10-CM | POA: Diagnosis not present

## 2015-01-05 DIAGNOSIS — I517 Cardiomegaly: Secondary | ICD-10-CM | POA: Insufficient documentation

## 2015-01-05 DIAGNOSIS — I2699 Other pulmonary embolism without acute cor pulmonale: Secondary | ICD-10-CM

## 2015-01-05 DIAGNOSIS — F17200 Nicotine dependence, unspecified, uncomplicated: Secondary | ICD-10-CM | POA: Insufficient documentation

## 2015-01-05 DIAGNOSIS — I059 Rheumatic mitral valve disease, unspecified: Secondary | ICD-10-CM | POA: Insufficient documentation

## 2015-01-05 DIAGNOSIS — E785 Hyperlipidemia, unspecified: Secondary | ICD-10-CM | POA: Insufficient documentation

## 2015-01-05 DIAGNOSIS — I10 Essential (primary) hypertension: Secondary | ICD-10-CM | POA: Diagnosis not present

## 2015-01-05 NOTE — Telephone Encounter (Signed)
[name]  Let [name] know that testosterone patch likely a risk factor for blood clot. Possible. HE should talk to PCP Derek Reel, MD and see if can come off it  Thanks  Dr. Brand Males, M.D., [facility].C.P Pulmonary and Critical Care Medicine Staff Physician [facility] Pager: [phone], If no answer or between  15:00h - 7:00h: call [phone]  01/05/2015 10:26 AM

## 2015-01-05 NOTE — Telephone Encounter (Signed)
Spoke with patient, aware of rec's per MR Pt states that he will not wear the patch until he speaks with PCP about this.  Pt states that he will also contact our office and let us know the outcome of his conversation with PCP.  Nothing further needed at this time.

## 2015-01-09 ENCOUNTER — Ambulatory Visit (HOSPITAL_COMMUNITY)
Admission: RE | Admit: 2015-01-09 | Discharge: 2015-01-09 | Disposition: A | Discharge status: Home / Self Care | Payer: Medicare Other | Source: Ambulatory Visit | Attending: Internal Medicine | Admitting: Internal Medicine

## 2015-01-09 ENCOUNTER — Telehealth: Payer: Self-pay | Admitting: Internal Medicine

## 2015-01-09 DIAGNOSIS — I82412 Acute embolism and thrombosis of left femoral vein: Secondary | ICD-10-CM | POA: Diagnosis not present

## 2015-01-09 DIAGNOSIS — I82432 Acute embolism and thrombosis of left popliteal vein: Secondary | ICD-10-CM | POA: Insufficient documentation

## 2015-01-09 DIAGNOSIS — I2692 Saddle embolus of pulmonary artery without acute cor pulmonale: Secondary | ICD-10-CM

## 2015-01-09 DIAGNOSIS — I2699 Other pulmonary embolism without acute cor pulmonale: Secondary | ICD-10-CM | POA: Diagnosis not present

## 2015-01-09 DIAGNOSIS — I82890 Acute embolism and thrombosis of other specified veins: Secondary | ICD-10-CM | POA: Insufficient documentation

## 2015-01-09 DIAGNOSIS — I82442 Acute embolism and thrombosis of left tibial vein: Secondary | ICD-10-CM | POA: Diagnosis not present

## 2015-01-09 NOTE — Telephone Encounter (Signed)
Spoke with pt.  Dicussed below per MR.  Pt verbalized understanding, is in agreement with plan, and voiced no further questions or concerns at this time.

## 2015-01-09 NOTE — Telephone Encounter (Signed)
Let patient know  A) that xarelto will Rx his DVT B) any worsening leg swelling to report immediately C) any worsening dyspnea to repott immediately D) quit smokig and hold off testosterone E) avoid excess trauma, jumping around, long car drives next several week to prevent DVT migration to lung F) watch leg swelling - should go away next few weeks  No change in Nov 2016 fu plan

## 2015-01-09 NOTE — Telephone Encounter (Signed)
Received call report from [facility] with Vascular Lab with prelim results on doppler: positive left leg DVT from mid femoral vein to ankle.  Pt still there; needs to know if ok for pt to leave.  Call back [phone] ext [phone]  Spoke with MR.  Ok per MR for pt to leave.  Advised to stay on xarelto and office will call back later today with further recs.  Spoke with LaDonna.  Advised of above per MR.  She verbalized understanding and will relay information to pt.  MR, please advise on further recs.  Thank you.

## 2015-01-12 DIAGNOSIS — H25811 Combined forms of age-related cataract, right eye: Secondary | ICD-10-CM | POA: Diagnosis not present

## 2015-01-12 DIAGNOSIS — H2511 Age-related nuclear cataract, right eye: Secondary | ICD-10-CM | POA: Diagnosis not present

## 2015-01-25 DIAGNOSIS — M25552 Pain in left hip: Secondary | ICD-10-CM | POA: Diagnosis not present

## 2015-02-20 DIAGNOSIS — E785 Hyperlipidemia, unspecified: Secondary | ICD-10-CM | POA: Diagnosis not present

## 2015-02-20 DIAGNOSIS — R0602 Shortness of breath: Secondary | ICD-10-CM | POA: Diagnosis not present

## 2015-02-20 DIAGNOSIS — E669 Obesity, unspecified: Secondary | ICD-10-CM | POA: Diagnosis not present

## 2015-02-20 DIAGNOSIS — I2699 Other pulmonary embolism without acute cor pulmonale: Secondary | ICD-10-CM | POA: Diagnosis not present

## 2015-02-20 DIAGNOSIS — I6521 Occlusion and stenosis of right carotid artery: Secondary | ICD-10-CM | POA: Diagnosis not present

## 2015-02-20 DIAGNOSIS — Z23 Encounter for immunization: Secondary | ICD-10-CM | POA: Diagnosis not present

## 2015-02-20 DIAGNOSIS — I10 Essential (primary) hypertension: Secondary | ICD-10-CM | POA: Diagnosis not present

## 2015-02-20 DIAGNOSIS — Z6830 Body mass index (BMI) 30.0-30.9, adult: Secondary | ICD-10-CM | POA: Diagnosis not present

## 2015-02-20 DIAGNOSIS — F17210 Nicotine dependence, cigarettes, uncomplicated: Secondary | ICD-10-CM | POA: Diagnosis not present

## 2015-02-21 DIAGNOSIS — J9811 Atelectasis: Secondary | ICD-10-CM | POA: Diagnosis not present

## 2015-02-21 DIAGNOSIS — I2699 Other pulmonary embolism without acute cor pulmonale: Secondary | ICD-10-CM | POA: Diagnosis not present

## 2015-02-22 DIAGNOSIS — K117 Disturbances of salivary secretion: Secondary | ICD-10-CM | POA: Diagnosis not present

## 2015-02-22 DIAGNOSIS — Z85819 Personal history of malignant neoplasm of unspecified site of lip, oral cavity, and pharynx: Secondary | ICD-10-CM | POA: Diagnosis not present

## 2015-04-05 DIAGNOSIS — Z85819 Personal history of malignant neoplasm of unspecified site of lip, oral cavity, and pharynx: Secondary | ICD-10-CM | POA: Diagnosis not present

## 2015-04-05 DIAGNOSIS — K117 Disturbances of salivary secretion: Secondary | ICD-10-CM | POA: Diagnosis not present

## 2015-04-10 DIAGNOSIS — D485 Neoplasm of uncertain behavior of skin: Secondary | ICD-10-CM | POA: Diagnosis not present

## 2015-04-10 DIAGNOSIS — Z85820 Personal history of malignant melanoma of skin: Secondary | ICD-10-CM | POA: Diagnosis not present

## 2015-04-10 DIAGNOSIS — D1801 Hemangioma of skin and subcutaneous tissue: Secondary | ICD-10-CM | POA: Diagnosis not present

## 2015-04-10 DIAGNOSIS — L821 Other seborrheic keratosis: Secondary | ICD-10-CM | POA: Diagnosis not present

## 2015-04-10 DIAGNOSIS — L570 Actinic keratosis: Secondary | ICD-10-CM | POA: Diagnosis not present

## 2015-05-01 ENCOUNTER — Other Ambulatory Visit: Payer: Self-pay | Admitting: Otolaryngology

## 2015-05-01 DIAGNOSIS — K1379 Other lesions of oral mucosa: Secondary | ICD-10-CM | POA: Diagnosis not present

## 2015-05-01 DIAGNOSIS — K1370 Unspecified lesions of oral mucosa: Secondary | ICD-10-CM | POA: Diagnosis not present

## 2015-05-03 ENCOUNTER — Other Ambulatory Visit: Payer: Self-pay | Admitting: *Deleted

## 2015-05-03 ENCOUNTER — Encounter: Payer: Self-pay | Admitting: Family

## 2015-05-03 DIAGNOSIS — I6523 Occlusion and stenosis of bilateral carotid arteries: Secondary | ICD-10-CM

## 2015-05-08 ENCOUNTER — Encounter: Payer: Self-pay | Admitting: Family

## 2015-05-08 ENCOUNTER — Ambulatory Visit (HOSPITAL_COMMUNITY)
Admission: RE | Admit: 2015-05-08 | Discharge: 2015-05-08 | Disposition: A | Discharge status: Home / Self Care | Payer: Medicare Other | Source: Ambulatory Visit | Attending: Family | Admitting: Family

## 2015-05-08 ENCOUNTER — Encounter: Payer: Medicare Other | Admitting: Family

## 2015-05-08 ENCOUNTER — Inpatient Hospital Stay (HOSPITAL_COMMUNITY): Admission: RE | Admit: 2015-05-08 | Payer: Medicare Other | Source: Ambulatory Visit

## 2015-05-08 VITALS — BP 111/78 | HR 92 | Temp 98.7°F | Resp 16 | Ht 70.0 in | Wt 208.0 lb

## 2015-05-08 DIAGNOSIS — N189 Chronic kidney disease, unspecified: Secondary | ICD-10-CM | POA: Insufficient documentation

## 2015-05-08 DIAGNOSIS — E785 Hyperlipidemia, unspecified: Secondary | ICD-10-CM | POA: Diagnosis not present

## 2015-05-08 DIAGNOSIS — I6523 Occlusion and stenosis of bilateral carotid arteries: Secondary | ICD-10-CM | POA: Diagnosis not present

## 2015-05-08 DIAGNOSIS — I129 Hypertensive chronic kidney disease with stage 1 through stage 4 chronic kidney disease, or unspecified chronic kidney disease: Secondary | ICD-10-CM | POA: Diagnosis not present

## 2015-05-08 NOTE — Progress Notes (Signed)
Left after carotid duplex performed and before being evaluated.

## 2015-05-11 ENCOUNTER — Ambulatory Visit: Payer: Medicare Other | Admitting: Internal Medicine

## 2015-05-12 ENCOUNTER — Other Ambulatory Visit: Payer: Self-pay

## 2015-05-12 DIAGNOSIS — Z9889 Other specified postprocedural states: Secondary | ICD-10-CM | POA: Insufficient documentation

## 2015-05-12 DIAGNOSIS — I6522 Occlusion and stenosis of left carotid artery: Secondary | ICD-10-CM

## 2015-05-12 NOTE — Patient Instructions (Signed)
Dear Mr. [name], Your recent Vascular Lab study  December 19,2016  Indicates; No significant change when compared to the  Previous exam on  November 01, 2014. Please follow up in  One Year !            Stroke Prevention Some medical conditions and behaviors are associated with an increased chance of having a stroke. You may prevent a stroke by making healthy choices and managing medical conditions. HOW CAN I REDUCE MY RISK OF HAVING A STROKE?   Stay physically active. Get at least 30 minutes of activity on most or all days.  Do not smoke. It may also be helpful to avoid exposure to secondhand smoke.  Limit alcohol use. Moderate alcohol use is considered to be:  No more than 2 drinks per day for men.  No more than 1 drink per day for nonpregnant women.  Eat healthy foods. This involves:  Eating 5 or more servings of fruits and vegetables a day.  Making dietary changes that address high blood pressure (hypertension), high cholesterol, diabetes, or obesity.  Manage your cholesterol levels.  Making food choices that are high in fiber and low in saturated fat, trans fat, and cholesterol may control cholesterol levels.  Take any prescribed medicines to control cholesterol as directed by your health care provider.  Manage your diabetes.  Controlling your carbohydrate and sugar intake is recommended to manage diabetes.  Take any prescribed medicines to control diabetes as directed by your health care provider.  Control your hypertension.  Making food choices that are low in salt (sodium), saturated fat, trans fat, and cholesterol is recommended to manage hypertension.  Ask your health care provider if you need treatment to lower your blood pressure. Take any prescribed medicines to control hypertension as directed by your health care provider.  If you are 56-71 years of age, have your blood pressure checked every 3-5 years. If you are 74 years of age or older, have  your blood pressure checked every year.  Maintain a healthy weight.  Reducing calorie intake and making food choices that are low in sodium, saturated fat, trans fat, and cholesterol are recommended to manage weight.  Stop drug abuse.  Avoid taking birth control pills.  Talk to your health care provider about the risks of taking birth control pills if you are over 28 years old, smoke, get migraines, or have ever had a blood clot.  Get evaluated for sleep disorders (sleep apnea).  Talk to your health care provider about getting a sleep evaluation if you snore a lot or have excessive sleepiness.  Take medicines only as directed by your health care provider.  For some people, aspirin or blood thinners (anticoagulants) are helpful in reducing the risk of forming abnormal blood clots that can lead to stroke. If you have the irregular heart rhythm of atrial fibrillation, you should be on a blood thinner unless there is a good reason you cannot take them.  Understand all your medicine instructions.  Make sure that other conditions (such as anemia or atherosclerosis) are addressed. SEEK IMMEDIATE MEDICAL CARE IF:   You have sudden weakness or numbness of the face, arm, or leg, especially on one side of the body.  Your face or eyelid droops to one side.  You have sudden confusion.  You have trouble speaking (aphasia) or understanding.  You have sudden trouble seeing in one or both eyes.  You have sudden trouble walking.  You have dizziness.  You have  a loss of balance or coordination.  You have a sudden, severe headache with no known cause.  You have new chest pain or an irregular heartbeat. Any of these symptoms may represent a serious problem that is an emergency. Do not wait to see if the symptoms will go away. Get medical help at once. Call your local emergency services (911 in [place] Do not drive yourself to the hospital.   This information is not intended to replace advice  given to you by your health care provider. Make sure you discuss any questions you have with your health care provider.   Document Released: 06/13/2004 Document Revised: 05/27/2014 Document Reviewed: 11/06/2012 Elsevier Interactive Patient Education [employer].

## 2015-05-16 NOTE — Progress Notes (Signed)
Apparently this patient left before being seen.

## 2015-05-29 ENCOUNTER — Encounter: Payer: Self-pay | Admitting: Family

## 2015-05-29 ENCOUNTER — Other Ambulatory Visit: Payer: Self-pay

## 2015-05-29 DIAGNOSIS — Z9889 Other specified postprocedural states: Secondary | ICD-10-CM

## 2015-05-29 DIAGNOSIS — I6522 Occlusion and stenosis of left carotid artery: Secondary | ICD-10-CM

## 2015-05-29 NOTE — Patient Instructions (Signed)
Dear Mr. [name], Your recent Vascular Lab study on May 08, 2015 indicates: No significant change when compared  To the previous exam on November 01, 2014.  Please follow up in  One Year !          Stroke Prevention Some medical conditions and behaviors are associated with an increased chance of having a stroke. You may prevent a stroke by making healthy choices and managing medical conditions. HOW CAN I REDUCE MY RISK OF HAVING A STROKE?   Stay physically active. Get at least 30 minutes of activity on most or all days.  Do not smoke. It may also be helpful to avoid exposure to secondhand smoke.  Limit alcohol use. Moderate alcohol use is considered to be:  No more than 2 drinks per day for men.  No more than 1 drink per day for nonpregnant women.  Eat healthy foods. This involves:  Eating 5 or more servings of fruits and vegetables a day.  Making dietary changes that address high blood pressure (hypertension), high cholesterol, diabetes, or obesity.  Manage your cholesterol levels.  Making food choices that are high in fiber and low in saturated fat, trans fat, and cholesterol may control cholesterol levels.  Take any prescribed medicines to control cholesterol as directed by your health care provider.  Manage your diabetes.  Controlling your carbohydrate and sugar intake is recommended to manage diabetes.  Take any prescribed medicines to control diabetes as directed by your health care provider.  Control your hypertension.  Making food choices that are low in salt (sodium), saturated fat, trans fat, and cholesterol is recommended to manage hypertension.  Ask your health care provider if you need treatment to lower your blood pressure. Take any prescribed medicines to control hypertension as directed by your health care provider.  If you are 24-43 years of age, have your blood pressure checked every 3-5 years. If you are 25 years of age or older, have your  blood pressure checked every year.  Maintain a healthy weight.  Reducing calorie intake and making food choices that are low in sodium, saturated fat, trans fat, and cholesterol are recommended to manage weight.  Stop drug abuse.  Avoid taking birth control pills.  Talk to your health care provider about the risks of taking birth control pills if you are over 8 years old, smoke, get migraines, or have ever had a blood clot.  Get evaluated for sleep disorders (sleep apnea).  Talk to your health care provider about getting a sleep evaluation if you snore a lot or have excessive sleepiness.  Take medicines only as directed by your health care provider.  For some people, aspirin or blood thinners (anticoagulants) are helpful in reducing the risk of forming abnormal blood clots that can lead to stroke. If you have the irregular heart rhythm of atrial fibrillation, you should be on a blood thinner unless there is a good reason you cannot take them.  Understand all your medicine instructions.  Make sure that other conditions (such as anemia or atherosclerosis) are addressed. SEEK IMMEDIATE MEDICAL CARE IF:   You have sudden weakness or numbness of the face, arm, or leg, especially on one side of the body.  Your face or eyelid droops to one side.  You have sudden confusion.  You have trouble speaking (aphasia) or understanding.  You have sudden trouble seeing in one or both eyes.  You have sudden trouble walking.  You have dizziness.  You have a loss  of balance or coordination.  You have a sudden, severe headache with no known cause.  You have new chest pain or an irregular heartbeat. Any of these symptoms may represent a serious problem that is an emergency. Do not wait to see if the symptoms will go away. Get medical help at once. Call your local emergency services (911 in [place] Do not drive yourself to the hospital.   This information is not intended to replace advice given  to you by your health care provider. Make sure you discuss any questions you have with your health care provider.   Document Released: 06/13/2004 Document Revised: 05/27/2014 Document Reviewed: 11/06/2012 Elsevier Interactive Patient Education [employer].

## 2015-05-31 DIAGNOSIS — L570 Actinic keratosis: Secondary | ICD-10-CM | POA: Diagnosis not present

## 2015-06-01 DIAGNOSIS — M1612 Unilateral primary osteoarthritis, left hip: Secondary | ICD-10-CM | POA: Diagnosis not present

## 2015-06-08 DIAGNOSIS — M1612 Unilateral primary osteoarthritis, left hip: Secondary | ICD-10-CM | POA: Diagnosis not present

## 2015-06-13 DIAGNOSIS — Z8589 Personal history of malignant neoplasm of other organs and systems: Secondary | ICD-10-CM | POA: Diagnosis not present

## 2015-06-13 DIAGNOSIS — K1370 Unspecified lesions of oral mucosa: Secondary | ICD-10-CM | POA: Diagnosis not present

## 2015-07-11 DIAGNOSIS — I2699 Other pulmonary embolism without acute cor pulmonale: Secondary | ICD-10-CM | POA: Diagnosis not present

## 2015-07-12 DIAGNOSIS — I10 Essential (primary) hypertension: Secondary | ICD-10-CM | POA: Diagnosis not present

## 2015-07-13 DIAGNOSIS — N401 Enlarged prostate with lower urinary tract symptoms: Secondary | ICD-10-CM | POA: Diagnosis not present

## 2015-07-13 DIAGNOSIS — E291 Testicular hypofunction: Secondary | ICD-10-CM | POA: Diagnosis not present

## 2015-07-19 DIAGNOSIS — N138 Other obstructive and reflux uropathy: Secondary | ICD-10-CM | POA: Diagnosis not present

## 2015-07-19 DIAGNOSIS — Z0000 Encounter for general adult medical examination without abnormal findings: Secondary | ICD-10-CM | POA: Diagnosis not present

## 2015-07-19 DIAGNOSIS — Z85819 Personal history of malignant neoplasm of unspecified site of lip, oral cavity, and pharynx: Secondary | ICD-10-CM | POA: Diagnosis not present

## 2015-07-19 DIAGNOSIS — R350 Frequency of micturition: Secondary | ICD-10-CM | POA: Diagnosis not present

## 2015-07-19 DIAGNOSIS — L538 Other specified erythematous conditions: Secondary | ICD-10-CM | POA: Diagnosis not present

## 2015-07-19 DIAGNOSIS — N401 Enlarged prostate with lower urinary tract symptoms: Secondary | ICD-10-CM | POA: Diagnosis not present

## 2015-07-20 ENCOUNTER — Ambulatory Visit (INDEPENDENT_AMBULATORY_CARE_PROVIDER_SITE_OTHER): Payer: Medicare Other | Admitting: Internal Medicine

## 2015-07-20 ENCOUNTER — Encounter: Payer: Self-pay | Admitting: Internal Medicine

## 2015-07-20 VITALS — BP 152/82 | HR 96 | Ht 70.0 in | Wt 216.8 lb

## 2015-07-20 DIAGNOSIS — I6522 Occlusion and stenosis of left carotid artery: Secondary | ICD-10-CM | POA: Diagnosis not present

## 2015-07-20 DIAGNOSIS — Z86711 Personal history of pulmonary embolism: Secondary | ICD-10-CM | POA: Diagnosis not present

## 2015-07-20 DIAGNOSIS — Z86718 Personal history of other venous thrombosis and embolism: Secondary | ICD-10-CM | POA: Diagnosis not present

## 2015-07-20 NOTE — Patient Instructions (Signed)
ICD-9-CM ICD-10-CM   1. History of pulmonary embolism V12.55 Z86.711   2. History of DVT of lower extremity V12.51 Z86.718     Glad you are doing well No testosterone patch ever Contnue Eliquis - minimum through august 2018 and then reassess Ok to scuba dive but avoid injuries   - if doing high injury risk activity hold eliquis for 24h-48h but do not make it a habit  followup  1 year or sooner if needed - d-dimer check at followup

## 2015-07-20 NOTE — Progress Notes (Signed)
Subjective:     Patient ID: [name], male   DOB: 03-Sep-1940, 75 y.o.   MRN: [id number]  HPI    IOV 01/04/2015  Chief Complaint  Patient presents with  . Pulmonary Consult    Pt referred by Dr. Shon Baton for PE. Pt denies SOB and CP/tightness. Pt c/o heomptysis, decreasing in blood volume that pt is coughing up.  Pt states the hemoptysis started on 8.8.16 with pain in right shoulder.     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 He had negative cardiac stress test in March 2016. In April 2016 had right total knee replacement. After that he has been pretty active. He denies any long trips. Around first week of August 2016 noticed left calf swelling that then improved. Then on 12/26/2014 started having right acute scapular pain that was pleuritic and severe associated with hemoptysis. This is per his history and also per review of referring physician's chart which echo the same information. Physical exam at primary care physician noticed bibasal crackles. He was subjected to CT angiography that showed bilateral saddle emboli. I do not have the visual images with me but I do have the report. The CT scan was done 12/27/2014 at [facility] on [address] [place]. Reports says but large bilateral pulmonary emboli in the distal main pulmonary arteries as well as lobar arteries. There is associated scarring and atelectasis. This opacity in the right middle lobe which could represent lung infarct or residual scarring. He was then started on ELIQUIS herapy as an outpatient. He was never on oxygen therapy. He was never hospitalized. Since then he significantly improved. Hemoptysis is resolved. Right infrascapular pain is also improved significantly. Currently he is feeling fine and is actively working. He denies any syncope orany problems. 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 Testosterone patch    OV 07/20/2015  Chief Complaint  Patient presents with  . Follow-up    Pt denies SOB, cough, and CP/tightness. Pt denies any current complaints at this time.     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 Several risk factors noted above.  He has had no further problems. No dyspnea. No bleeding issues. No hemoptysis no chest pain no wheezing no cough. He is no longer on the testosterone patch but wanted about restarting if he felt that it would be okay. Other risk factors of being overweight and smoking cigars continue. In addition over the summer he plans to go scuba diving in the [place]. He says that he will prepare himself to cut down risk of injury. He has been advised that he needs lifelong anticoagulation by his primary care physician. He also says that in the interim he has had several CT chest to follow-up resolution of the blood clot because he desired dose. He showed me one recent CT chest which I visualize personally and it looks like there is no blood clot. In addition Pulm parenchyma does not show any lung cancer.      Current outpatient prescriptions:  .  apixaban (ELIQUIS) 5 MG TABS tablet, Take 10 mg by mouth daily. , Disp: , Rfl:  .  Cholecalciferol (VITAMIN D PO), Take 6,000 Units by mouth daily., Disp: , Rfl:  .  esomeprazole (  NEXIUM) 40 MG capsule, Take 40 mg by mouth daily as needed (acid reflux). , Disp: , Rfl:  .  fenofibrate (TRICOR) 145 MG tablet, Take 145 mg by mouth daily., Disp: , Rfl:  .  fluticasone (FLONASE) 50 MCG/ACT nasal spray, Place 2 sprays into both nostrils daily as needed for allergies or rhinitis., Disp: , Rfl:  .  losartan-hydrochlorothiazide (HYZAAR) 100-25 MG per tablet, Take 1 tablet by mouth daily., Disp: , Rfl:  .  probenecid (BENEMID) 500 MG tablet, Take 500 mg by mouth  daily., Disp: , Rfl:  .  rosuvastatin (CRESTOR) 10 MG tablet, Take 10 mg by mouth daily., Disp: , Rfl:  .  tadalafil (CIALIS) 5 MG tablet, Take 5 mg by mouth daily., Disp: , Rfl:      Allergies  Allergen Reactions  . Cefaclor Other (See Comments)     skin split  . Penicillins Rash    Immunization History  Administered Date(s) Administered  . Influenza Split 02/17/2014  . Influenza,inj,Quad PF,36+ Mos 01/19/2015  . Pneumococcal-Unspecified 05/20/2012  . Zoster 05/20/2012    reports that he has been smoking Cigars.  He has never used smokeless tobacco.   Review of Systems     Objective:   Physical Exam  Constitutional: He is oriented to person, place, and time. He appears well-developed and well-nourished. No distress.  HENT:  Head: Normocephalic and atraumatic.  Right Ear: External ear normal.  Left Ear: External ear normal.  Mouth/Throat: Oropharynx is clear and moist. No oropharyngeal exudate.  Eyes: Conjunctivae and EOM are normal. Pupils are equal, round, and reactive to light. Right eye exhibits no discharge. Left eye exhibits no discharge. No scleral icterus.  Neck: Normal range of motion. Neck supple. No JVD present. No tracheal deviation present. No thyromegaly present.  Cardiovascular: Normal rate, regular rhythm and intact distal pulses.  Exam reveals no gallop and no friction rub.   No murmur heard. Pulmonary/Chest: Effort normal and breath sounds normal. No respiratory distress. He has no wheezes. He has no rales. He exhibits no tenderness.  Abdominal: Soft. Bowel sounds are normal. He exhibits no distension and no mass. There is no tenderness. There is no rebound and no guarding.  Musculoskeletal: Normal range of motion. He exhibits no tenderness.  Bilateral varicose veins present   Lymphadenopathy:    He has no cervical adenopathy.  Neurological: He is alert and oriented to person, place, and time. He has normal reflexes. No cranial nerve deficit.  Coordination normal.  Skin: Skin is warm and dry. No rash noted. He is not diaphoretic. No erythema. No pallor.  Psychiatric: He has a normal mood and affect. His behavior is normal. Judgment and thought content normal.  Nursing note and vitals reviewed.  Filed Vitals:   07/20/15 1429  BP: 152/82  Pulse: 96  Height: 5\' 10"  (1.778 m)  Weight: 216 lb 12.8 oz (98.34 kg)  SpO2: 98%        Assessment:       ICD-9-CM ICD-10-CM   1. History of pulmonary embolism V12.55 Z86.711   2. History of DVT of lower extremity V12.51 Z86.718         Plan:      Glad you are doing well No testosterone patch ever Contnue Eliquis - minimum through august 2018 and then reassess with d-dimer to look for recurrence Ok to scuba dive but avoid injuries   - if doing high injury risk activity hold eliquis for 24h-48h but do not make it a habit  followup  1 year or sooner if needed - d-dimer check at followup   Dr. Brand Males, M.D., [facility].C.P Pulmonary and Critical Care Medicine Staff Physician [facility] Pager: [phone], If no answer or between  15:00h - 7:00h: call [phone]  07/20/2015 3:16 PM

## 2015-08-16 DIAGNOSIS — M1612 Unilateral primary osteoarthritis, left hip: Secondary | ICD-10-CM | POA: Diagnosis not present

## 2015-08-18 DIAGNOSIS — R7309 Other abnormal glucose: Secondary | ICD-10-CM | POA: Diagnosis not present

## 2015-08-18 DIAGNOSIS — M109 Gout, unspecified: Secondary | ICD-10-CM | POA: Diagnosis not present

## 2015-08-18 DIAGNOSIS — E298 Other testicular dysfunction: Secondary | ICD-10-CM | POA: Diagnosis not present

## 2015-08-18 DIAGNOSIS — E559 Vitamin D deficiency, unspecified: Secondary | ICD-10-CM | POA: Diagnosis not present

## 2015-08-18 DIAGNOSIS — Z125 Encounter for screening for malignant neoplasm of prostate: Secondary | ICD-10-CM | POA: Diagnosis not present

## 2015-08-18 DIAGNOSIS — I10 Essential (primary) hypertension: Secondary | ICD-10-CM | POA: Diagnosis not present

## 2015-08-18 DIAGNOSIS — E784 Other hyperlipidemia: Secondary | ICD-10-CM | POA: Diagnosis not present

## 2015-08-18 DIAGNOSIS — E291 Testicular hypofunction: Secondary | ICD-10-CM | POA: Diagnosis not present

## 2015-08-23 ENCOUNTER — Other Ambulatory Visit: Payer: Self-pay | Admitting: Otolaryngology

## 2015-08-25 DIAGNOSIS — I6521 Occlusion and stenosis of right carotid artery: Secondary | ICD-10-CM | POA: Diagnosis not present

## 2015-08-25 DIAGNOSIS — I1310 Hypertensive heart and chronic kidney disease without heart failure, with stage 1 through stage 4 chronic kidney disease, or unspecified chronic kidney disease: Secondary | ICD-10-CM | POA: Insufficient documentation

## 2015-08-25 DIAGNOSIS — E291 Testicular hypofunction: Secondary | ICD-10-CM | POA: Diagnosis not present

## 2015-08-25 DIAGNOSIS — R739 Hyperglycemia, unspecified: Secondary | ICD-10-CM | POA: Diagnosis not present

## 2015-08-25 DIAGNOSIS — E559 Vitamin D deficiency, unspecified: Secondary | ICD-10-CM | POA: Diagnosis not present

## 2015-08-25 DIAGNOSIS — Z6832 Body mass index (BMI) 32.0-32.9, adult: Secondary | ICD-10-CM | POA: Diagnosis not present

## 2015-08-25 DIAGNOSIS — I2699 Other pulmonary embolism without acute cor pulmonale: Secondary | ICD-10-CM | POA: Diagnosis not present

## 2015-08-25 DIAGNOSIS — C069 Malignant neoplasm of mouth, unspecified: Secondary | ICD-10-CM | POA: Diagnosis not present

## 2015-08-25 DIAGNOSIS — N183 Chronic kidney disease, stage 3 (moderate): Secondary | ICD-10-CM | POA: Diagnosis not present

## 2015-08-25 DIAGNOSIS — Z1389 Encounter for screening for other disorder: Secondary | ICD-10-CM | POA: Diagnosis not present

## 2015-08-25 DIAGNOSIS — Z0000 Encounter for general adult medical examination without abnormal findings: Secondary | ICD-10-CM | POA: Diagnosis not present

## 2015-08-25 DIAGNOSIS — N401 Enlarged prostate with lower urinary tract symptoms: Secondary | ICD-10-CM | POA: Diagnosis not present

## 2015-08-25 DIAGNOSIS — M859 Disorder of bone density and structure, unspecified: Secondary | ICD-10-CM | POA: Diagnosis not present

## 2015-08-29 ENCOUNTER — Encounter: Payer: Self-pay | Admitting: Cardiology

## 2015-08-30 NOTE — Pre-Procedure Instructions (Signed)
[name]  08/30/2015      [facility] - [place], [place] - [address] [place] [address] Phone: [phone] Fax: [phone]    Your procedure is scheduled on Friday, September 08, 2015  Report to [facility] Admitting at 7:00 A.M.   Call this number if you have problems the morning of surgery:  [phone]   Remember:  Do not eat food or drink liquids after midnight Thursday, September 07, 2015   Take these medicines the morning of surgery with A SIP OF WATER: if needed: acetaminophen (TYLENOL) for pain, esomeprazole (NEXIUM) for acid reflux, fluticasone (FLONASE)  nasal spray for allergies.  Please follow your MD's instructions on when to stop taking Eliquis.  5 days prior to surgery, stop taking: Probenecid (Benemid), Aspirin, NSAIDS, Aleve, Naproxen, Ibuprofen, Advil, Motrin, BC's, Goody's, Fish oil, all herbal medications, and all vitamins.     Do not wear jewelry.  Do not wear lotions, powders, or colognes.     Men may shave face and neck.  Do not bring valuables to the hospital.   [facility] is not responsible for any belongings or valuables.  Contacts, dentures or bridgework may not be worn into surgery.  Leave your suitcase in the car.  After surgery it may be brought to your room.  For patients admitted to the hospital, discharge time will be determined by your treatment team.  Patients discharged the day of surgery will not be allowed to drive home.      Special instructions: [facility] - Preparing for Surgery  Before surgery, you can play an important role.  Because skin is not sterile, your skin needs to be as free of germs as possible.  You can reduce the number of germs on you skin by washing with CHG (chlorahexidine gluconate) soap before surgery.  CHG is an antiseptic cleaner which kills germs and bonds with the skin to continue killing germs even after washing.  Please DO NOT use if you have an allergy to CHG or  antibacterial soaps.  If your skin becomes reddened/irritated stop using the CHG and inform your nurse when you arrive at Short Stay.  Do not shave (including legs and underarms) for at least 48 hours prior to the first CHG shower.  You may shave your face.  Please follow these instructions carefully:   1.  Shower with CHG Soap the night before surgery and the morning of Surgery.  2.  If you choose to wash your hair, wash your hair first as usual with your normal shampoo.  3.  After you shampoo, rinse your hair and body thoroughly to remove the Shampoo.  4.  Use CHG as you would any other liquid soap.  You can apply chg directly  to the skin and wash gently with scrungie or a clean washcloth.  5.  Apply the CHG Soap to your body ONLY FROM THE NECK DOWN.  Do not use on open wounds or open sores.  Avoid contact with your eyes, ears, mouth and genitals (private parts).  Wash genitals (private parts) with your normal soap.  6.  Wash thoroughly, paying special attention to the area where your surgery will be performed.  7.  Thoroughly rinse your body with warm water from the neck down.  8.  DO NOT shower/wash with your normal soap after using and rinsing off the CHG Soap.  9.  Pat yourself dry with a clean towel.  10.  Wear clean pajamas.            11.  Place clean sheets on your bed the night of your first shower and do not sleep with pets.  Day of Surgery  Do not apply any lotions/deodorants the morning of surgery.  Please wear clean clothes to the hospital/surgery center.  Please read over the following fact sheets that you were given. Pain Booklet, Coughing and Deep Breathing and Surgical Site Infection Prevention

## 2015-08-31 ENCOUNTER — Encounter (HOSPITAL_COMMUNITY)
Admission: RE | Admit: 2015-08-31 | Discharge: 2015-08-31 | Disposition: A | Discharge status: Home / Self Care | Payer: Medicare Other | Source: Ambulatory Visit | Attending: Otolaryngology | Admitting: Otolaryngology

## 2015-08-31 ENCOUNTER — Encounter (HOSPITAL_COMMUNITY): Payer: Self-pay

## 2015-08-31 DIAGNOSIS — I10 Essential (primary) hypertension: Secondary | ICD-10-CM | POA: Diagnosis not present

## 2015-08-31 LAB — COMPREHENSIVE METABOLIC PANEL
ALBUMIN: 3.8 g/dL (ref 3.5–5.0)
ALK PHOS: 28 U/L — AB (ref 38–126)
ALT: 24 U/L (ref 17–63)
AST: 24 U/L (ref 15–41)
Anion gap: 12 (ref 5–15)
BUN: 33 mg/dL — AB (ref 6–20)
CALCIUM: 10 mg/dL (ref 8.9–10.3)
CHLORIDE: 102 mmol/L (ref 101–111)
CO2: 24 mmol/L (ref 22–32)
CREATININE: 1.95 mg/dL — AB (ref 0.61–1.24)
GFR calc non Af Amer: 32 mL/min — ABNORMAL LOW (ref >60)
GFR, EST AFRICAN AMERICAN: 38 mL/min — AB (ref >60)
GLUCOSE: 117 mg/dL — AB (ref 65–99)
Potassium: 4.6 mmol/L (ref 3.5–5.1)
SODIUM: 138 mmol/L (ref 135–145)
Total Bilirubin: 1 mg/dL (ref 0.3–1.2)
Total Protein: 6.5 g/dL (ref 6.5–8.1)

## 2015-08-31 LAB — CBC
HCT: 49.1 % (ref 39.0–52.0)
Hemoglobin: 16.5 g/dL (ref 13.0–17.0)
MCH: 32.4 pg (ref 26.0–34.0)
MCHC: 33.6 g/dL (ref 30.0–36.0)
MCV: 96.3 fL (ref 78.0–100.0)
PLATELETS: 149 10*3/uL — AB (ref 150–400)
RBC: 5.1 MIL/uL (ref 4.22–5.81)
RDW: 14.6 % (ref 11.5–15.5)
WBC: 9.2 10*3/uL (ref 4.0–10.5)

## 2015-08-31 NOTE — Progress Notes (Signed)
Per DJ at Dr. Keane Police office, patient needs to stop Eliquis 4 days prior to surgery and restart per Dr. Wilburn Cornelia.  Left voicemail for patient.

## 2015-08-31 NOTE — Progress Notes (Signed)
PCP - Dr. Shon Baton  Pulmonologist - Dr. Chase Caller  EKG- 08/31/15 CXR - denies Echo-12/26/14 Stress test - 06/03/14 Cardiologist - denies  Patient denies chest pain and shortness of breath at PAT appointment.    Patient states that he has not been instructed on when to stop taking Eliquis.  Messages left at Dr. Victorio Palm and Dr. Keane Police office for instructions on when to stop Eliquis.  Patient understands that Eliquis is typically stopped 5-7 days prior to surgery but that instructions are needed from MD.  Patient states that he will also attempt to contact offices.    PT-INR will need to be drawn DOS.

## 2015-09-01 NOTE — Progress Notes (Addendum)
Anesthesia Chart Review:  Pt is a 75 year old male scheduled for oral lesion excision with CO2 laser on 09/08/2015 with Dr. Wilburn Cornelia.   PCP is Dr. Shon Baton. Cardiologist is Dr. Einar Gip.   PMH includes:  HTN, PE (12/2014), hyperlipidemia, OSA (no CPAP since 2000 surgery), GERD. Current smoker. BMI 30. S/p R TKA 09/02/14. S/p R CEA 10/01/13.  Medications include: eliquis, nexium, fenofibrate, losartan-hctz, crestor, cialis. Pt to stop eliquis 4 days prior to surgery  Preoperative labs reviewed.   - Cr 1.95, BUN 33. Renal function was normal for surgery last year. I have forwarded to Dr. Virgina Jock for review.  - PT will be obtained DOS  EKG 08/31/15: Sinus rhythm with occasional PVCs. 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 Wall thickness was normal. Systolic function was vigorous. The estimated ejection fraction was in the range of 65% to 70%. Wall motion was normal; there were no regional wall motion abnormalities. Doppler parameters are consistent with abnormal left ventricular relaxation (grade 1 diastolic dysfunction). - Aortic valve: Cusp separation was reduced. Transvalvular velocity was minimally increased. There was no stenosis. Valve area (VTI): 2.89 cm^2. Valve area (Vmean): 2.74 cm^2. - Mitral valve: Moderately calcified annulus. Mildly thickened leaflets . - Left atrium: The atrium was mildly dilated. - Right ventricle: The cavity size was normal. Wall thickness was normal.  Nuclear stress test 06/03/14 (correspondence 09/05/14 in media tab):  - Perfusion imaging demonstrates very mild diaphragmatic attenuation artifact in the inferior wall. There was no evidence of ischemia or infarct. LV systolic function calculated by QGS was normal at 52% with normal endocardial thickening in all vascular territories. Low risk study.  Willeen Cass,  FNP-BC [facility] Short Stay Surgical Center/Anesthesiology Phone: [phone] 09/01/2015 12:52 PM  Addendum:  Dr. Virgina Jock rechecked pt's renal function 09/06/15. Now Cr is 1.5, which Dr. Virgina Jock notes is pt's baseline. Dr. Virgina Jock has ok'd pt to proceed with surgery.   If no changes, I anticipate pt can proceed with surgery as scheduled.

## 2015-09-06 DIAGNOSIS — I1310 Hypertensive heart and chronic kidney disease without heart failure, with stage 1 through stage 4 chronic kidney disease, or unspecified chronic kidney disease: Secondary | ICD-10-CM | POA: Diagnosis not present

## 2015-09-06 DIAGNOSIS — N183 Chronic kidney disease, stage 3 (moderate): Secondary | ICD-10-CM | POA: Diagnosis not present

## 2015-09-08 ENCOUNTER — Ambulatory Visit (HOSPITAL_COMMUNITY): Payer: Medicare Other | Admitting: Anesthesiology

## 2015-09-08 ENCOUNTER — Encounter (HOSPITAL_COMMUNITY)
Admission: RE | Disposition: A | Discharge status: Home / Self Care | Payer: Self-pay | Source: Ambulatory Visit | Attending: Otolaryngology

## 2015-09-08 ENCOUNTER — Encounter (HOSPITAL_COMMUNITY): Payer: Self-pay | Admitting: *Deleted

## 2015-09-08 ENCOUNTER — Ambulatory Visit (HOSPITAL_COMMUNITY)
Admission: RE | Admit: 2015-09-08 | Discharge: 2015-09-08 | Disposition: A | Discharge status: Home / Self Care | Payer: Medicare Other | Source: Ambulatory Visit | Attending: Otolaryngology | Admitting: Otolaryngology

## 2015-09-08 ENCOUNTER — Ambulatory Visit (HOSPITAL_COMMUNITY): Payer: Medicare Other | Admitting: Emergency Medicine

## 2015-09-08 DIAGNOSIS — E785 Hyperlipidemia, unspecified: Secondary | ICD-10-CM | POA: Insufficient documentation

## 2015-09-08 DIAGNOSIS — G4730 Sleep apnea, unspecified: Secondary | ICD-10-CM | POA: Diagnosis not present

## 2015-09-08 DIAGNOSIS — F17290 Nicotine dependence, other tobacco product, uncomplicated: Secondary | ICD-10-CM | POA: Diagnosis not present

## 2015-09-08 DIAGNOSIS — Z7901 Long term (current) use of anticoagulants: Secondary | ICD-10-CM | POA: Diagnosis not present

## 2015-09-08 DIAGNOSIS — Z96653 Presence of artificial knee joint, bilateral: Secondary | ICD-10-CM | POA: Insufficient documentation

## 2015-09-08 DIAGNOSIS — Z880 Allergy status to penicillin: Secondary | ICD-10-CM | POA: Diagnosis not present

## 2015-09-08 DIAGNOSIS — K1370 Unspecified lesions of oral mucosa: Secondary | ICD-10-CM | POA: Diagnosis not present

## 2015-09-08 DIAGNOSIS — N189 Chronic kidney disease, unspecified: Secondary | ICD-10-CM | POA: Diagnosis not present

## 2015-09-08 DIAGNOSIS — M1990 Unspecified osteoarthritis, unspecified site: Secondary | ICD-10-CM | POA: Diagnosis not present

## 2015-09-08 DIAGNOSIS — K219 Gastro-esophageal reflux disease without esophagitis: Secondary | ICD-10-CM | POA: Diagnosis not present

## 2015-09-08 DIAGNOSIS — I10 Essential (primary) hypertension: Secondary | ICD-10-CM | POA: Insufficient documentation

## 2015-09-08 DIAGNOSIS — Z85828 Personal history of other malignant neoplasm of skin: Secondary | ICD-10-CM | POA: Insufficient documentation

## 2015-09-08 HISTORY — PX: EXCISION ORAL LESION WITH CO2 LASER: SHX6461

## 2015-09-08 LAB — PROTIME-INR
INR: 1.18 (ref 0.00–1.49)
PROTHROMBIN TIME: 15.2 s (ref 11.6–15.2)

## 2015-09-08 SURGERY — DESTRUCTION, LESION, MOUTH, USING CO2 LASER
Anesthesia: General | Site: Throat

## 2015-09-08 MED ORDER — ONDANSETRON HCL 4 MG/2ML IJ SOLN: 4.0000 mg | Freq: Once | INTRAMUSCULAR | Status: DC | PRN

## 2015-09-08 MED ORDER — DEXAMETHASONE SODIUM PHOSPHATE 10 MG/ML IJ SOLN
10.0000 mg | Freq: Once | INTRAMUSCULAR | Status: DC
  Filled 2015-09-08: qty 1

## 2015-09-08 MED ORDER — METHYLENE BLUE 0.5 % INJ SOLN
INTRAVENOUS | Status: AC
  Filled 2015-09-08: qty 10

## 2015-09-08 MED ORDER — SUGAMMADEX SODIUM 200 MG/2ML IV SOLN
INTRAVENOUS | Status: DC | PRN
  Administered 2015-09-08: 189.2 mg via INTRAVENOUS

## 2015-09-08 MED ORDER — FENTANYL CITRATE (PF) 100 MCG/2ML IJ SOLN: 25.0000 ug | INTRAMUSCULAR | Status: DC | PRN

## 2015-09-08 MED ORDER — PROPOFOL 10 MG/ML IV BOLUS
INTRAVENOUS | Status: AC
  Filled 2015-09-08: qty 20

## 2015-09-08 MED ORDER — DEXAMETHASONE SODIUM PHOSPHATE 10 MG/ML IJ SOLN
INTRAMUSCULAR | Status: DC | PRN
  Administered 2015-09-08: 10 mg via INTRAVENOUS

## 2015-09-08 MED ORDER — ONDANSETRON HCL 4 MG/2ML IJ SOLN
INTRAMUSCULAR | Status: DC | PRN
Start: 2015-09-08 — End: 2015-09-08
  Administered 2015-09-08: 4 mg via INTRAVENOUS

## 2015-09-08 MED ORDER — PROPOFOL 10 MG/ML IV BOLUS
INTRAVENOUS | Status: DC | PRN
  Administered 2015-09-08: 200 mg via INTRAVENOUS

## 2015-09-08 MED ORDER — ROCURONIUM BROMIDE 100 MG/10ML IV SOLN
INTRAVENOUS | Status: DC | PRN
  Administered 2015-09-08: 35 mg via INTRAVENOUS

## 2015-09-08 MED ORDER — LIDOCAINE-EPINEPHRINE 1 %-1:100000 IJ SOLN
INTRAMUSCULAR | Status: AC
  Filled 2015-09-08: qty 1

## 2015-09-08 MED ORDER — LIDOCAINE HCL (CARDIAC) 20 MG/ML IV SOLN
INTRAVENOUS | Status: DC | PRN
Start: 2015-09-08 — End: 2015-09-08
  Administered 2015-09-08: 100 mg via INTRAVENOUS

## 2015-09-08 MED ORDER — SUGAMMADEX SODIUM 200 MG/2ML IV SOLN
INTRAVENOUS | Status: AC
  Filled 2015-09-08: qty 2

## 2015-09-08 MED ORDER — LACTATED RINGERS IV SOLN
INTRAVENOUS | Status: DC | PRN
  Administered 2015-09-08: 09:00:00 via INTRAVENOUS

## 2015-09-08 MED ORDER — FENTANYL CITRATE (PF) 100 MCG/2ML IJ SOLN
INTRAMUSCULAR | Status: DC | PRN
Start: 2015-09-08 — End: 2015-09-08
  Administered 2015-09-08: 50 ug via INTRAVENOUS

## 2015-09-08 MED ORDER — HYDROCODONE-ACETAMINOPHEN 7.5-325 MG/15ML PO SOLN: 10.0000 mL | ORAL | Status: DC | PRN

## 2015-09-08 MED ORDER — CIPROFLOXACIN IN D5W 400 MG/200ML IV SOLN
400.0000 mg | Freq: Once | INTRAVENOUS | Status: AC
  Administered 2015-09-08: 400 mg via INTRAVENOUS
  Filled 2015-09-08: qty 200

## 2015-09-08 MED ORDER — LACTATED RINGERS IV SOLN
INTRAVENOUS | Status: DC
  Administered 2015-09-08: 08:00:00 via INTRAVENOUS

## 2015-09-08 MED ORDER — EPHEDRINE SULFATE 50 MG/ML IJ SOLN
INTRAMUSCULAR | Status: DC | PRN
  Administered 2015-09-08: 10 mg via INTRAVENOUS

## 2015-09-08 MED ORDER — FENTANYL CITRATE (PF) 250 MCG/5ML IJ SOLN
INTRAMUSCULAR | Status: AC
  Filled 2015-09-08: qty 5

## 2015-09-08 MED ORDER — PHENYLEPHRINE HCL 10 MG/ML IJ SOLN
INTRAMUSCULAR | Status: DC | PRN
  Administered 2015-09-08: 80 ug via INTRAVENOUS
  Administered 2015-09-08: 120 ug via INTRAVENOUS

## 2015-09-08 SURGICAL SUPPLY — 50 items
ATTRACTOMAT 16X20 MAGNETIC DRP (DRAPES) IMPLANT
BAG DECANTER FOR FLEXI CONT (MISCELLANEOUS) ×3 IMPLANT
BLADE SURG 15 STRL LF DISP TIS (BLADE) IMPLANT
BLADE SURG 15 STRL SS (BLADE) ×3
BLADE SURG ROTATE 9660 (MISCELLANEOUS) ×1 IMPLANT
BNDG GAUZE ELAST 4 BULKY (GAUZE/BANDAGES/DRESSINGS) ×3 IMPLANT
CANISTER SUCTION 2500CC (MISCELLANEOUS) ×3 IMPLANT
CLEANER TIP ELECTROSURG 2X2 (MISCELLANEOUS) ×1 IMPLANT
CONT SPEC 4OZ CLIKSEAL STRL BL (MISCELLANEOUS) ×1 IMPLANT
COVER SURGICAL LIGHT HANDLE (MISCELLANEOUS) ×1 IMPLANT
DRAIN CHANNEL 10F 3/8 F FF (DRAIN) IMPLANT
DRAIN SNY 7 FPER (WOUND CARE) IMPLANT
DRAPE PROXIMA HALF (DRAPES) IMPLANT
ELECT COATED BLADE 2.86 ST (ELECTRODE) ×3 IMPLANT
ELECT PAIRED SUBDERMAL (MISCELLANEOUS)
ELECT REM PT RETURN 9FT ADLT (ELECTROSURGICAL)
ELECTRODE PAIRED SUBDERMAL (MISCELLANEOUS) IMPLANT
ELECTRODE REM PT RTRN 9FT ADLT (ELECTROSURGICAL) ×1 IMPLANT
EVACUATOR SILICONE 100CC (DRAIN) IMPLANT
GAUZE SPONGE 4X4 16PLY XRAY LF (GAUZE/BANDAGES/DRESSINGS) ×3 IMPLANT
GLOVE BIOGEL M 7.0 STRL (GLOVE) ×3 IMPLANT
GOWN STRL REUS W/ TWL LRG LVL3 (GOWN DISPOSABLE) ×2 IMPLANT
GOWN STRL REUS W/TWL LRG LVL3 (GOWN DISPOSABLE) ×6
KIT BASIN OR (CUSTOM PROCEDURE TRAY) ×3 IMPLANT
KIT ROOM TURNOVER OR (KITS) ×3 IMPLANT
LOCATOR NERVE 3 VOLT (DISPOSABLE) ×3 IMPLANT
NDL HYPO 25GX1X1/2 BEV (NEEDLE) IMPLANT
NEEDLE HYPO 25GX1X1/2 BEV (NEEDLE) ×3 IMPLANT
NS IRRIG 1000ML POUR BTL (IV SOLUTION) ×3 IMPLANT
PAD ARMBOARD 7.5X6 YLW CONV (MISCELLANEOUS) ×6 IMPLANT
PENCIL BUTTON HOLSTER BLD 10FT (ELECTRODE) ×1 IMPLANT
ROLLS DENTAL (MISCELLANEOUS) IMPLANT
SPONGE INTESTINAL PEANUT (DISPOSABLE) IMPLANT
SPONGE LAP 18X18 X RAY DECT (DISPOSABLE) ×3 IMPLANT
STAPLER VISISTAT 35W (STAPLE) ×1 IMPLANT
SUT SILK 2 0 FS (SUTURE) IMPLANT
SUT SILK 2 0 REEL (SUTURE) ×1 IMPLANT
SUT SILK 3 0 REEL (SUTURE) ×1 IMPLANT
SUT VIC AB 3-0 FS2 27 (SUTURE) IMPLANT
SUT VIC AB 4-0 SH 27 (SUTURE)
SUT VIC AB 4-0 SH 27XBRD (SUTURE) IMPLANT
SYR 5ML LL (SYRINGE) IMPLANT
SYR BULB 3OZ (MISCELLANEOUS) ×3 IMPLANT
TOWEL OR 17X24 6PK STRL BLUE (TOWEL DISPOSABLE) ×3 IMPLANT
TRAY ENT MC OR (CUSTOM PROCEDURE TRAY) ×3 IMPLANT
TRAY FOLEY CATH 14FRSI W/METER (CATHETERS) IMPLANT
TUBE CONNECTING 12'X1/4 (SUCTIONS) ×1
TUBE CONNECTING 12X1/4 (SUCTIONS) ×2 IMPLANT
UNDERPAD 30X30 INCONTINENT (UNDERPADS AND DIAPERS) IMPLANT
WATER STERILE IRR 1000ML POUR (IV SOLUTION) ×3 IMPLANT

## 2015-09-08 NOTE — Transfer of Care (Signed)
Immediate Anesthesia Transfer of Care Note  Patient: [name]  Procedure(s) Performed: Procedure(s): EXCISION ORAL LESION WITH CO2 LASER (N/A)  Patient Location: PACU  Anesthesia Type:General  Level of Consciousness: awake, alert  and oriented  Airway & Oxygen Therapy: Patient Spontanous Breathing and Patient connected to face mask oxygen  Post-op Assessment: Report given to RN, Post -op Vital signs reviewed and stable and Patient moving all extremities X 4  Post vital signs: Reviewed and stable  Last Vitals:  Filed Vitals:   09/08/15 0747 09/08/15 1045  BP: 116/69 116/70  Pulse: 75 91  Temp: 36.8 C 36.9 C  Resp: 18 21    Complications: No apparent anesthesia complications

## 2015-09-08 NOTE — Anesthesia Preprocedure Evaluation (Addendum)
Anesthesia Evaluation  Patient identified by MRN, date of birth, ID band Patient awake  General Assessment Comment:HTN, PE (12/2014), hyperlipidemia, OSA (no CPAP since 2000 surgery), GERD. Current smoker. BMI 30. S/p R TKA 09/02/14. S/p R CEA 10/01/13  Reviewed: Allergy & Precautions, NPO status , Patient's Chart, lab work & pertinent test results  History of Anesthesia Complications (+) history of anesthetic complications ("Couldn't sleep for 3 days")  Airway Mallampati: II  TM Distance: >3 FB Neck ROM: Full    Dental  (+) Teeth Intact, Dental Advisory Given   Pulmonary sleep apnea , Current Smoker, PE   Pulmonary exam normal breath sounds clear to auscultation       Cardiovascular hypertension, Pt. on medications + Peripheral Vascular Disease  Normal cardiovascular exam Rhythm:Regular Rate:Normal  Echo 01/05/15:  - Left ventricle: The cavity size was normal. Wall thickness was normal. Systolic function was vigorous. The estimated ejection fraction was in the range of 65% to 70%. Wall motion was normal; there were no regional wall motion abnormalities. Doppler parameters are consistent with abnormal left ventricular relaxation (grade 1 diastolic dysfunction). - Aortic valve: Cusp separation was reduced. Transvalvular velocity was minimally increased. There was no stenosis. Valve area (VTI): 2.89 cm^2. Valve area (Vmean): 2.74 cm^2. - Mitral valve: Moderately calcified annulus. Mildly thickened leaflets . - Left atrium: The atrium was mildly dilated. - Right ventricle: The cavity size was normal. Wall thickness was normal.   Neuro/Psych negative neurological ROS  negative psych ROS   GI/Hepatic Neg liver ROS, GERD  ,  Endo/Other  negative endocrine ROS  Renal/GU Renal InsufficiencyRenal disease     Musculoskeletal  (+) Arthritis , Osteoarthritis,    Abdominal   Peds  Hematology negative hematology ROS (+)   Anesthesia  Other Findings Day of surgery medications reviewed with the patient.  Reproductive/Obstetrics                        Anesthesia Physical Anesthesia Plan  ASA: III  Anesthesia Plan: General   Post-op Pain Management:    Induction: Intravenous  Airway Management Planned: Oral ETT  Additional Equipment:   Intra-op Plan:   Post-operative Plan: Extubation in OR  Informed Consent: I have reviewed the patients History and Physical, chart, labs and discussed the procedure including the risks, benefits and alternatives for the proposed anesthesia with the patient or authorized representative who has indicated his/her understanding and acceptance.   Dental advisory given and Dental Advisory Given  Plan Discussed with: CRNA and Anesthesiologist  Anesthesia Plan Comments: (Risks/benefits of general anesthesia discussed with patient including risk of damage to teeth, lips, gum, and tongue, nausea/vomiting, allergic reactions to medications, and the possibility of heart attack, stroke and death.  All patient questions answered.  Patient wishes to proceed.)      Anesthesia Quick Evaluation

## 2015-09-08 NOTE — H&P (Signed)
[name] is an 75 y.o. male.   Chief Complaint: Anterior FOM Lesion HPI: Pt with hx of SCCa involving FOM and anterior tongue, s/p Exc and reconstruction. Dysplastic lesion Anterior FOM Lesion  Past Medical History  Diagnosis Date  . Hypertension   . Colon polyps   . Prostate hypertrophy   . Arthritis   . Skin cancer   . Allergy   . Cataract     having cataract surgery July 2016  . Hyperlipidemia   . Chronic kidney disease     hx kidney stones  . Pulmonary embolism ([facility]) Aug. 2016  . Complication of anesthesia     after surgery 07-2013 unable to sleep for 3 days  . Sleep apnea     had suegery in 2000 for sleep apnea, no CPAP needed  . GERD (gastroesophageal reflux disease)     Past Surgical History  Procedure Laterality Date  . Replacement total knee Left   . 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 Removal of melonomia Left     shoulder  . Sleep apnea surgery  2000  . Rhinoplasty      1999  . Endarterectomy Right 10/01/2013    Procedure: ENDARTERECTOMY CAROTID-RIGHT;  Surgeon: Rosetta Posner, MD;  Location: [facility];  Service: Vascular;  Laterality: Right;  . Patch angioplasty Right 10/01/2013    Procedure: PATCH ANGIOPLASTY USING HEMASHIELD FINESSE PATCH;  Surgeon: Rosetta Posner, MD;  Location: [facility];  Service: Vascular;  Laterality: Right;  . Esophagogastroduodenoscopy endoscopy  10-12-2013  . Carotid endarterectomy Right Oct 01, 2013    CE  . Rhinoplasty    . Total knee arthroplasty Right 09/02/2014    Procedure: RIGHT TOTAL KNEE ARTHROPLASTY;  Surgeon: Netta Cedars, MD;  Location: [facility];  Service: Orthopedics;  Laterality: Right;  . Colonoscopy    . Skin cancer excision      squamous cell cancer under tongue  . Joint replacement Right September 02, 2014    Knee  . Joint replacement Left 2012    Knee  . Eye surgery Bilateral     cataracts    Family History  Problem Relation Age of Onset  .  Diverticulosis Mother   . Skin cancer Mother   . Varicose Veins Mother   . Hyperlipidemia Father   . Heart disease Father   . Heart attack Father   . Colon cancer Neg Hx   . Esophageal cancer Neg Hx   . Rectal cancer Neg Hx   . Stomach cancer Neg Hx    Social History:  reports that he has been smoking Cigars.  He has never used smokeless tobacco. He reports that he drinks about 12.6 oz of alcohol per week. He reports that he does not use illicit drugs.  Allergies:  Allergies  Allergen Reactions  . Cefaclor Other (See Comments)     skin split  . Penicillins Rash    Medications Prior to Admission  Medication Sig Dispense Refill  . acetaminophen (TYLENOL) 650 MG CR tablet Take 1,300 mg by mouth 2 (two) times daily as needed for pain.    Marland Kitchen apixaban (ELIQUIS) 5 MG TABS tablet Take 10 mg by mouth daily.     . Cholecalciferol (VITAMIN D PO) Take 10,000 Units by mouth daily. 5000 units per tablet    . fenofibrate (TRICOR) 145 MG tablet Take 145 mg by mouth daily.    Marland Kitchen losartan-hydrochlorothiazide (  HYZAAR) 100-25 MG per tablet Take 1 tablet by mouth daily.    . probenecid (BENEMID) 500 MG tablet Take 500 mg by mouth daily.    . rosuvastatin (CRESTOR) 10 MG tablet Take 10 mg by mouth daily.    . tadalafil (CIALIS) 5 MG tablet Take 5 mg by mouth daily.    Marland Kitchen esomeprazole (NEXIUM) 40 MG capsule Take 40 mg by mouth daily as needed (acid reflux).     . fluticasone (FLONASE) 50 MCG/ACT nasal spray Place 2 sprays into both nostrils daily as needed for allergies or rhinitis.      Results for orders placed or performed during the hospital encounter of 09/08/15 (from the past 48 hour(s))  PT- INR Day of Surgery     Status: None   Collection Time: 09/08/15  7:59 AM  Result Value Ref Range   Prothrombin Time 15.2 11.6 - 15.2 seconds   INR 1.18 0.00 - 1.49   No results found.  Review of Systems  Constitutional: Negative.   HENT: Negative.   Respiratory: Negative.   Cardiovascular: Negative.    Gastrointestinal: Negative.     Blood pressure 116/69, pulse 75, temperature 98.3 F (36.8 C), temperature source Oral, resp. rate 18, weight 94.62 kg (208 lb 9.6 oz), SpO2 96 %. Physical Exam  Constitutional: He appears well-developed and well-nourished.  HENT:  Ant FOM lesion  Neck: Normal range of motion. Neck supple.  Cardiovascular: Normal rate.   Respiratory: Effort normal.     Assessment/Plan Adm for OP laser ablation of Anterior FOM Lesion  Brandan Glauber, MD 09/08/2015, 9:01 AM

## 2015-09-08 NOTE — Addendum Note (Signed)
Addendum  created 09/08/15 1641 by Neldon Newport, CRNA   Modules edited: Anesthesia Events, Narrator   Narrator:  Narrator: Event Log Edited

## 2015-09-08 NOTE — Op Note (Signed)
NAMECARLSON, [name], M.D.DATE OF BIRTH:  08/22/1940  DATE OF PROCEDURE:  09/08/2015 DATE OF DISCHARGE:                              OPERATIVE REPORT   PREOPERATIVE DIAGNOSIS:  Anterior floor of mouth lesion.  POSTOPERATIVE DIAGNOSIS:  Anterior floor of mouth lesion.  INDICATION FOR SURGERY:  Anterior floor of mouth lesion.  SURGICAL PROCEDURE:  CO2 laser ablation of anterior floor of mouth lesion.  ANESTHESIA:  General endotracheal.  SURGEON:  Early Chars. Wilburn Cornelia, M.D.  COMPLICATIONS:  None.  BLOOD LOSS:  None.  The patient transferred from the operating room to the recovery room in stable condition.  BRIEF HISTORY:  The patient is a 75 year old male who has been followed with a history of oral carcinoma.  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  Surgery performed was 10 years ago with excellent long-term result and healing.  The patient has been followed with an area of mucosal irritation adjacent to the previous surgical site.  Biopsy was performed which showed dysplastic changes.  Given his history and findings, I recommended CO2 laser ablation of the lesion under anesthesia.  The risks and benefits of the procedure were discussed in detail with the patient and his family and they understood and agreed with our plan for surgery which is scheduled on an elective basis at [facility].  DESCRIPTION OF PROCEDURE:  The patient was brought to the operating room on September 08, 2015, and placed in supine position on the operating table. General endotracheal anesthesia was established without difficulty.  A surgical time-out was then performed with correct identification of the patient and  the surgical procedure.  The patient was then positioned, prepped and draped and prepared.  With the patient prepared for surgery, the oral cavity was carefully examined.  There were no mucosal mass, lesion or ulcer with the exception of a 2-cm superficial erythematous lesion in the anterior floor of mouth.  A mouth guard was then placed and the CO2 laser was brought into the surgical field.  The laser was then appropriately tested and power setting indicated prior to the surgical procedure. With the CO2 laser, the area of superficial mucosal inflammatory changes was completely ablated.  An area approximately 2 cm in diameter was treated. There were no other lesions or abnormalities.  No bleeding, no inflammation, and normal salivary flow through the left submandibular duct.  The laser was removed from the surgical field and an orogastric tube was passed.  Stomach contents were aspirated.  The patient's oral cavity and oropharynx were irrigated and suctioned.  He was awakened from his anesthetic, was then transferred from the operating room to the recovery room in stable condition.  There were no complications.  An estimated blood loss was minimal.          ______________________________ Early Chars. Wilburn Cornelia, M.D.     DLS/MEDQ  D:  [id number]  T:  09/08/2015  Job:  [id number]

## 2015-09-08 NOTE — Brief Op Note (Signed)
09/08/2015  10:46 AM  PATIENT:  [name]  75 y.o. male  PRE-OPERATIVE DIAGNOSIS:  ORAL LESION  POST-OPERATIVE DIAGNOSIS:  ORAL LESION  PROCEDURE:  Procedure(s): EXCISION ORAL LESION WITH CO2 LASER (N/A)  SURGEON:  Surgeon(s) and Role:    * Jerrell Belfast, MD - Primary  PHYSICIAN ASSISTANT:   ASSISTANTS: none   ANESTHESIA:   general  EBL:  Total I/O In: 800 [I.V.:800] Out: 1 [Blood:1]  BLOOD ADMINISTERED:none  DRAINS: none   LOCAL MEDICATIONS USED:  NONE  SPECIMEN:  No Specimen  DISPOSITION OF SPECIMEN:  N/A  COUNTS:  YES  TOURNIQUET:  * No tourniquets in log *  DICTATION: .Other Dictation: Dictation Number [id number]  PLAN OF CARE: Discharge to home after PACU  PATIENT DISPOSITION:  PACU - hemodynamically stable.   Delay start of Pharmacological VTE agent (>24hrs) due to surgical blood loss or risk of bleeding: not applicable

## 2015-09-08 NOTE — Anesthesia Procedure Notes (Signed)
Procedure Name: Intubation Date/Time: 09/08/2015 10:12 AM Performed by: Neldon Newport Pre-anesthesia Checklist: Patient being monitored, Suction available, Emergency Drugs available, Patient identified and Timeout performed Patient Re-evaluated:Patient Re-evaluated prior to inductionOxygen Delivery Method: Circle system utilized Preoxygenation: Pre-oxygenation with 100% oxygen Intubation Type: IV induction Ventilation: Mask ventilation without difficulty Laryngoscope Size: Mac, 3 and Glidescope Grade View: Grade II Tube type: Oral Tube size: 6.5 mm Number of attempts: 2 Placement Confirmation: positive ETCO2,  ETT inserted through vocal cords under direct vision and breath sounds checked- equal and bilateral Secured at: 22 cm Tube secured with: Tape Dental Injury: Teeth and Oropharynx as per pre-operative assessment

## 2015-09-08 NOTE — Anesthesia Postprocedure Evaluation (Signed)
Anesthesia Post Note  Patient: [name]  Procedure(s) Performed: Procedure(s) (LRB): EXCISION ORAL LESION WITH CO2 LASER (N/A)  Patient location during evaluation: PACU Anesthesia Type: General Level of consciousness: awake and alert Pain management: pain level controlled Vital Signs Assessment: post-procedure vital signs reviewed and stable Respiratory status: spontaneous breathing, nonlabored ventilation, respiratory function stable and patient connected to nasal cannula oxygen Cardiovascular status: blood pressure returned to baseline and stable Postop Assessment: no signs of nausea or vomiting Anesthetic complications: no    Last Vitals:  Filed Vitals:   09/08/15 1126 09/08/15 1136  BP: 110/72 118/87  Pulse: 79 80  Temp: 37.1 C   Resp: 15 16    Last Pain:  Filed Vitals:   09/08/15 1137  PainSc: 1                  Catalina Gravel

## 2015-09-11 ENCOUNTER — Encounter (HOSPITAL_COMMUNITY): Payer: Self-pay | Admitting: Otolaryngology

## 2015-09-15 DIAGNOSIS — Z1212 Encounter for screening for malignant neoplasm of rectum: Secondary | ICD-10-CM | POA: Diagnosis not present

## 2015-10-09 DIAGNOSIS — L570 Actinic keratosis: Secondary | ICD-10-CM | POA: Diagnosis not present

## 2015-10-09 DIAGNOSIS — L820 Inflamed seborrheic keratosis: Secondary | ICD-10-CM | POA: Diagnosis not present

## 2015-10-09 DIAGNOSIS — D1801 Hemangioma of skin and subcutaneous tissue: Secondary | ICD-10-CM | POA: Diagnosis not present

## 2015-10-12 DIAGNOSIS — M1612 Unilateral primary osteoarthritis, left hip: Secondary | ICD-10-CM | POA: Diagnosis not present

## 2015-10-20 ENCOUNTER — Ambulatory Visit (INDEPENDENT_AMBULATORY_CARE_PROVIDER_SITE_OTHER): Payer: Medicare Other | Admitting: Gastroenterology

## 2015-10-20 ENCOUNTER — Encounter: Payer: Self-pay | Admitting: Gastroenterology

## 2015-10-20 ENCOUNTER — Telehealth: Payer: Self-pay

## 2015-10-20 VITALS — BP 90/58 | HR 100 | Ht 70.0 in | Wt 215.0 lb

## 2015-10-20 DIAGNOSIS — I6522 Occlusion and stenosis of left carotid artery: Secondary | ICD-10-CM | POA: Diagnosis not present

## 2015-10-20 DIAGNOSIS — Z86010 Personal history of colonic polyps: Secondary | ICD-10-CM

## 2015-10-20 DIAGNOSIS — R748 Abnormal levels of other serum enzymes: Secondary | ICD-10-CM | POA: Diagnosis not present

## 2015-10-20 DIAGNOSIS — Z86711 Personal history of pulmonary embolism: Secondary | ICD-10-CM

## 2015-10-20 DIAGNOSIS — Z7901 Long term (current) use of anticoagulants: Secondary | ICD-10-CM

## 2015-10-20 DIAGNOSIS — R7989 Other specified abnormal findings of blood chemistry: Secondary | ICD-10-CM

## 2015-10-20 MED ORDER — NA SULFATE-K SULFATE-MG SULF 17.5-3.13-1.6 GM/177ML PO SOLN: 1.0000 | Freq: Once | ORAL | Status: DC

## 2015-10-20 NOTE — Telephone Encounter (Signed)
Estill Bamberg can you see if it is okay to hold the Eliquis for 3 days prior to the procedure? Given his GFR his risk for polypectomy related bleeding his too high if we only hold it for 36 hours. We would need to hold Eliquis at least 3 days prior to the procedure, and if he cannot due that he would require a Lovenox bridge. Can you ask Dr. Chase Caller if this is okay or if he requires a lovenox bridge? Thanks

## 2015-10-20 NOTE — Telephone Encounter (Signed)
36h pre-procedure and restart 24-48h post procedure if there is no bleeding

## 2015-10-20 NOTE — Addendum Note (Signed)
Addended by: Yetta Flock on: 10/20/2015 06:07 PM   Modules accepted: Level of Service

## 2015-10-20 NOTE — Telephone Encounter (Signed)
  10/20/2015   RE: [name] DOB: 11-05-1940 MRN: [id number]   Dear Dr. Chase Caller,    We have scheduled the above patient for an endoscopic procedure. Our records show that he is on anticoagulation therapy.   Please advise as to how long the patient may come off his therapy of Eliquis prior to the procedure, which is scheduled for 12/12/15.  Please route your answer to Marlon Pel, [place].   Sincerely,    Marlon Pel

## 2015-10-20 NOTE — Patient Instructions (Signed)

## 2015-10-20 NOTE — Progress Notes (Signed)
HPI :  75 y/o male here to discuss follow up colonoscopy. 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 Deatra Ina, removed in piecemeal, told to follow up in one year.   He is moving his bowels okay. No diatrrhea or constipation. He denies any blood in the stools. No bleeding on Eliquis. No FH of colon cancer.   He had a R knee replacement in April and then he had a PE in August last year. He has a history of PE / DVT and is on Eliquis at present time. He denies a history of CKD but his Cr was 1.9 when last drawn.  He reports having an interval lab draw for which we don't have at this time.   He otherwise feels well without complaints.     Past Medical History  Diagnosis Date  . Hypertension   . Colon polyps   . Prostate hypertrophy   . Arthritis   . Skin cancer   . Allergy   . Cataract     having cataract surgery July 2016  . Hyperlipidemia   . Chronic kidney disease     hx kidney stones  . Pulmonary embolism ([facility]) Aug. 2016  . Complication of anesthesia     after surgery 07-2013 unable to sleep for 3 days  . Sleep apnea     had suegery in 2000 for sleep apnea, no CPAP needed  . GERD (gastroesophageal reflux disease)      Past Surgical History  Procedure Laterality Date  . Replacement total knee Left   . 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 Removal of melonomia Left     shoulder  . Sleep apnea surgery  2000  . Rhinoplasty      1999  . Endarterectomy Right 10/01/2013    Procedure: ENDARTERECTOMY CAROTID-RIGHT;  Surgeon: Rosetta Posner, MD;  Location: [facility];  Service: Vascular;  Laterality: Right;  . Patch angioplasty Right 10/01/2013    Procedure: PATCH ANGIOPLASTY USING HEMASHIELD FINESSE PATCH;  Surgeon: Rosetta Posner, MD;  Location: [facility];  Service: Vascular;  Laterality: Right;  . Esophagogastroduodenoscopy endoscopy  10-12-2013  . Carotid endarterectomy Right Oct 01, 2013    CE  .  Rhinoplasty    . Total knee arthroplasty Right 09/02/2014    Procedure: RIGHT TOTAL KNEE ARTHROPLASTY;  Surgeon: Netta Cedars, MD;  Location: [facility];  Service: Orthopedics;  Laterality: Right;  . Colonoscopy    . Skin cancer excision      squamous cell cancer under tongue  . Joint replacement Right September 02, 2014    Knee  . Joint replacement Left 2012    Knee  . Eye surgery Bilateral     cataracts  . Excision oral lesion with co2 laser N/A 09/08/2015    Procedure: EXCISION ORAL LESION WITH CO2 LASER;  Surgeon: Jerrell Belfast, MD;  Location: [facility];  Service: ENT;  Laterality: N/A;   Family History  Problem Relation Age of Onset  . Diverticulosis Mother   . Skin cancer Mother   . Varicose Veins Mother   . Hyperlipidemia Father   . Heart disease Father   . Heart attack Father   . Colon cancer Neg Hx   . Esophageal cancer Neg Hx   . Rectal cancer Neg Hx   . Stomach cancer Neg Hx    Social History  Substance Use Topics  . Smoking status:  Light Tobacco Smoker -- 50 years    Types: Cigars  . Smokeless tobacco: Never Used     Comment: pt states he smokes a couple of cigars per day. Never smoke cigarettes  . Alcohol Use: 12.6 oz/week    7 Glasses of wine, 14 Shots of liquor per week   Current Outpatient Prescriptions  Medication Sig Dispense Refill  . acetaminophen (TYLENOL) 650 MG CR tablet Take 1,300 mg by mouth 2 (two) times daily as needed for pain.    Marland Kitchen apixaban (ELIQUIS) 5 MG TABS tablet Take 10 mg by mouth daily.     . Cholecalciferol (VITAMIN D PO) Take 10,000 Units by mouth daily. 5000 units per tablet    . esomeprazole (NEXIUM) 40 MG capsule Take 40 mg by mouth daily as needed (acid reflux).     . fenofibrate (TRICOR) 145 MG tablet Take 145 mg by mouth daily.    . fluticasone (FLONASE) 50 MCG/ACT nasal spray Place 2 sprays into both nostrils daily as needed for allergies or rhinitis.    Marland Kitchen losartan-hydrochlorothiazide (HYZAAR) 100-25 MG per tablet Take 1 tablet by mouth  daily.    . probenecid (BENEMID) 500 MG tablet Take 500 mg by mouth daily.    . rosuvastatin (CRESTOR) 10 MG tablet Take 10 mg by mouth daily.    . tadalafil (CIALIS) 5 MG tablet Take 5 mg by mouth daily.     No current facility-administered medications for this visit.   Allergies  Allergen Reactions  . Cefaclor Other (See Comments)     skin split  . Penicillins Rash     Review of Systems: All systems reviewed and negative except where noted in HPI.   Lab Results  Component Value Date   WBC 9.2 08/31/2015   HGB 16.5 08/31/2015   HCT 49.1 08/31/2015   MCV 96.3 08/31/2015   PLT 149* 08/31/2015   Lab Results  Component Value Date   CREATININE 1.95* 08/31/2015   BUN 33* 08/31/2015   NA 138 08/31/2015   K 4.6 08/31/2015   CL 102 08/31/2015   CO2 24 08/31/2015    Lab Results  Component Value Date   ALT 24 08/31/2015   AST 24 08/31/2015   ALKPHOS 28* 08/31/2015   BILITOT 1.0 08/31/2015     Physical Exam: BP 90/58 mmHg  Pulse 100  Ht 5\' 10"  (1.778 m)  Wt 215 lb (97.523 kg)  BMI 30.85 kg/m2  Repeat HR 80s Constitutional: Pleasant,well-developed, male in no acute distress. HEENT: Normocephalic and atraumatic. Conjunctivae are normal. No scleral icterus. Neck supple.  Cardiovascular: Normal rate, regular rhythm.  Pulmonary/chest: Effort normal and breath sounds normal. No wheezing, rales or rhonchi. Abdominal: Soft, nondistended, nontender. Bowel sounds active throughout. There are no masses palpable. No hepatomegaly. Extremities: trace edema Lymphadenopathy: No cervical adenopathy noted. Neurological: Alert and oriented to person place and time. Skin: Skin is warm and dry. No rashes noted. Psychiatric: Normal mood and affect. Behavior is normal.   ASSESSMENT AND PLAN: 75 y/o male with history of advanced polyp, large tubulovillous adenoma, removed one year ago, due for surveillance colonoscopy at this time. He also has a history of PE on Eliquis at present  time, with recent labs showing GFR in the 30s. I discussed risks / benefits of colonoscopy and anesthesia with him and he wished to proceed. Given the bleeding risk with polypectomy, he will need to stop the Eliquis prior to the procedure. Given his last GFR, the Eliquis would need to be  held 3 days prior to the procedure ASGE guidelines to minimize risk of bleeding with polypectomy. We will reach out to his ordering provider to see if it is okay to hold the Eliquis for that time frame. We may wish to have a repeat creatinine to clarify his renal function and he should follow up with his primary care regarding renal insufficiency if that is thought to be a new issue. He agreed.    Cambria Cellar, MD [facility] Gastroenterology Pager [phone]

## 2015-10-23 NOTE — Telephone Encounter (Signed)
Already spoke with patient and they will come on 11/02/15 to have labs drawn.

## 2015-10-23 NOTE — Telephone Encounter (Signed)
Please advise Dr. Chase Caller.

## 2015-10-23 NOTE — Telephone Encounter (Signed)
IT has been 10 months since PE. So, I think ok to hold for 3 days. Can you get him to come in and do a d-dimer test and if high can do a lovenox bridge.   FYI: for the eliquis trials  In PE per uptodate which I just looked up- they only excluded if creat was > 2.5mg % and GFR < 25 but I support 3d hold to be on safe side

## 2015-10-23 NOTE — Telephone Encounter (Signed)
Ok if is high  I would recommend lovenox bridge per coumadin clinic. If not, jsut hold for 72h and do procedure. Also given rising creat over months, I have ordered a bmet for both sedation risk and elqiuis safety  I will be out of town next week and with little accerss. So, hopefully above is good enough to self-act

## 2015-10-23 NOTE — Telephone Encounter (Signed)
In fact I just ordered it but best if your GI office tells him to do it. I do not want him confused with different offices calling. I will follow result. If you can get him to do it 10/23/2015 or 10/24/15  best because I will be out of country later this week

## 2015-10-23 NOTE — Telephone Encounter (Signed)
Patient states he cannot have the d-timer done until 11/02/15. He will be out of town for work until then. Told him to go directly to the lab in the basement and either our office or Dr. Golden Pop office will contact him with the results. Patient verbalized understanding.

## 2015-10-23 NOTE — Telephone Encounter (Signed)
Estill Bamberg just in case Dr. Chase Caller did not cc you. Can you help coordinate this? thanks

## 2015-10-23 NOTE — Telephone Encounter (Signed)
Thanks very much for the follow up and advice, I appreciate it.  Estill Bamberg can you please let the patient know and have him do a D-dimer test?  Thanks again

## 2015-11-02 ENCOUNTER — Other Ambulatory Visit (INDEPENDENT_AMBULATORY_CARE_PROVIDER_SITE_OTHER): Payer: Medicare Other

## 2015-11-02 DIAGNOSIS — Z86711 Personal history of pulmonary embolism: Secondary | ICD-10-CM

## 2015-11-02 LAB — BASIC METABOLIC PANEL
BUN: 24 mg/dL — AB (ref 6–23)
CO2: 29 meq/L (ref 19–32)
Calcium: 10.1 mg/dL (ref 8.4–10.5)
Chloride: 101 mEq/L (ref 96–112)
Creatinine, Ser: 1.75 mg/dL — ABNORMAL HIGH (ref 0.40–1.50)
GFR: 40.77 mL/min — ABNORMAL LOW (ref >60.00)
GLUCOSE: 145 mg/dL — AB (ref 70–99)
POTASSIUM: 3.9 meq/L (ref 3.5–5.1)
SODIUM: 138 meq/L (ref 135–145)

## 2015-11-02 LAB — D-DIMER, QUANTITATIVE: D-Dimer, Quant: 0.33 mcg/mL FEU (ref <0.50)

## 2015-11-03 NOTE — Telephone Encounter (Signed)
Dr Chase Caller- Patient's D Dimer is back. Can you please let patient know of any recommendations you have regarding this? Thanks.

## 2015-11-08 NOTE — Telephone Encounter (Signed)
HIs d-dimer is normal. Does not need lovenox bridge. HE can resume his NOAC 48h after colnoscpy if Dr Havery Moros is satisfied.   Thanks  Dr. Brand Males, M.D., [facility].C.P Pulmonary and Critical Care Medicine Staff Physician [facility] Pager: [phone], If no answer or between  15:00h - 7:00h: call [phone]  11/08/2015 11:19 AM

## 2015-11-08 NOTE — Telephone Encounter (Signed)
Thanks very much for your help with this! 

## 2015-11-10 NOTE — Telephone Encounter (Signed)
Left a message for patient to return my call. 

## 2015-11-10 NOTE — Telephone Encounter (Signed)
Patient called back to speak with Estill Bamberg and can be reached [phone].

## 2015-11-13 NOTE — Telephone Encounter (Signed)
Informed patient that his d-dimer was normal and per Dr. Golden Pop and Dr. Doyne Keel recommendations patient can hold his Eliquis 72 hours prior to his procedure. Patient verbalized understanding.

## 2015-11-27 DIAGNOSIS — C44729 Squamous cell carcinoma of skin of left lower limb, including hip: Secondary | ICD-10-CM | POA: Diagnosis not present

## 2015-12-12 ENCOUNTER — Encounter: Payer: Self-pay | Admitting: Gastroenterology

## 2015-12-12 ENCOUNTER — Ambulatory Visit (AMBULATORY_SURGERY_CENTER): Payer: Medicare Other | Admitting: Gastroenterology

## 2015-12-12 VITALS — BP 101/65 | HR 72 | Temp 97.3°F | Resp 16 | Ht 70.0 in | Wt 215.0 lb

## 2015-12-12 DIAGNOSIS — Z86010 Personal history of colonic polyps: Secondary | ICD-10-CM

## 2015-12-12 DIAGNOSIS — D120 Benign neoplasm of cecum: Secondary | ICD-10-CM

## 2015-12-12 DIAGNOSIS — I10 Essential (primary) hypertension: Secondary | ICD-10-CM | POA: Diagnosis not present

## 2015-12-12 DIAGNOSIS — G4733 Obstructive sleep apnea (adult) (pediatric): Secondary | ICD-10-CM | POA: Diagnosis not present

## 2015-12-12 NOTE — Op Note (Signed)
[facility] Patient Name: [name] Procedure Date: 12/12/2015 7:57 AM MRN: [id number] Endoscopist: Remo Lipps P. Havery Moros , MD Age: 75 Referring MD:  Date of Birth: 08-15-1940 Gender: Male Account #: [id number] Procedure:                Colonoscopy Indications:              Surveillance: Personal history of piecemeal removal                            of large tubulovillous adenoma on last colonoscopy                            (>1 year ago) Medicines:                Monitored Anesthesia Care Procedure:                Pre-Anesthesia Assessment:                           - Prior to the procedure, a History and Physical                            was performed, and patient medications and                            allergies were reviewed. The patient's tolerance of                            previous anesthesia was also reviewed. The risks                            and benefits of the procedure and the sedation                            options and risks were discussed with the patient.                            All questions were answered, and informed consent                            was obtained. Prior Anticoagulants: The patient has                            taken Eliquis (apixaban), last dose was 3 days                            prior to procedure. ASA Grade Assessment: III - A                            patient with severe systemic disease. After                            reviewing the risks and benefits, the patient was  deemed in satisfactory condition to undergo the                            procedure.                           After obtaining informed consent, the colonoscope                            was passed under direct vision. Throughout the                            procedure, the patient's blood pressure, pulse, and                            oxygen saturations were monitored continuously. The   [id number]) was introduced through the anus                            and advanced to the the cecum, identified by                            appendiceal orifice and ileocecal valve. The                            colonoscopy was performed without difficulty. The                            patient tolerated the procedure well. The quality                            of the bowel preparation was inadequate. The                            ileocecal valve, appendiceal orifice, and rectum                            were photographed. Scope In: 8:07:50 AM Scope Out: 8:21:48 AM Scope Withdrawal Time: 0 hours 11 minutes 6 seconds  Total Procedure Duration: 0 hours 13 minutes 58 seconds  Findings:                 The perianal and digital rectal examinations were                            normal.                           A large amount of semi-liquid stool was found in                            the entire colon, interfering with visualization.                            Lavage of the area was performed, resulting in  incomplete clearance with continued poor                            visualization in most of the colon, the stool was                            adeherent to the colon wall. The cecum was lavaged                            extensively and adequate visualization was achieved                            locally in this area and only a 60mm polyp was noted.                           A 4 mm polyp was found in the cecum. The polyp was                            sessile. The polyp was removed with a cold snare.                            Resection and retrieval were complete.                           Scattered medium-mouthed diverticula were found in                            the left colon.                           Non-bleeding internal hemorrhoids were found during                            retroflexion.                           The exam was  otherwise without abnormality. Complications:            No immediate complications. Estimated blood loss:                            Minimal. Estimated Blood Loss:     Estimated blood loss was minimal. Impression:               - Preparation of the colon was inadequate, although                            adequate views in the cecum were obtained                           - One 4 mm polyp in the cecum, removed with a cold                            snare. Resected and retrieved.                           -  Diverticulosis in the left colon.                           - Non-bleeding internal hemorrhoids.                           - The examination was otherwise normal. Recommendation:           - Patient has a contact number available for                            emergencies. The signs and symptoms of potential                            delayed complications were discussed with the                            patient. Return to normal activities tomorrow.                            Written discharge instructions were provided to the                            patient.                           - Resume previous diet.                           - Continue present medications.                           - Resume Eliquis in 3 days                           - Await pathology results.                           - No aspirin, ibuprofen, naproxen, or other                            non-steroidal anti-inflammatory drugs for 2 weeks                            after polyp removal.                           - Repeat colonoscopy is recommended for                            surveillance. The colonoscopy date will be                            determined after pathology results from today's                            exam become available for review. Remo Lipps P. Havery Moros, MD  12/12/2015 8:31:08 AM This report has been signed electronically.

## 2015-12-12 NOTE — Patient Instructions (Signed)
Discharge instructions given. Handouts on polyps,diverticulosis and hemorrhoids. Resume previous medications. Resume Eliquis in 3 days. No aspirin,ibuprofen,naproxen,or other non-steroidal anti-inflammatory drugs for 2 weeks. YOU HAD AN ENDOSCOPIC PROCEDURE TODAY AT [facility]:   Refer to the procedure report that was given to you for any specific questions about what was found during the examination.  If the procedure report does not answer your questions, please call your gastroenterologist to clarify.  If you requested that your care partner not be given the details of your procedure findings, then the procedure report has been included in a sealed envelope for you to review at your convenience later.  YOU SHOULD EXPECT: Some feelings of bloating in the abdomen. Passage of more gas than usual.  Walking can help get rid of the air that was put into your GI tract during the procedure and reduce the bloating. If you had a lower endoscopy (such as a colonoscopy or flexible sigmoidoscopy) you may notice spotting of blood in your stool or on the toilet paper. If you underwent a bowel prep for your procedure, you may not have a normal bowel movement for a few days.  Please Note:  You might notice some irritation and congestion in your nose or some drainage.  This is from the oxygen used during your procedure.  There is no need for concern and it should clear up in a day or so.  SYMPTOMS TO REPORT IMMEDIATELY:   Following lower endoscopy (colonoscopy or flexible sigmoidoscopy):  Excessive amounts of blood in the stool  Significant tenderness or worsening of abdominal pains  Swelling of the abdomen that is new, acute  Fever of 100F or higher   For urgent or emergent issues, a gastroenterologist can be reached at any hour by calling [phone].   DIET: Your first meal following the procedure should be a small meal and then it is ok to progress to your normal diet. Heavy or  fried foods are harder to digest and may make you feel nauseous or bloated.  Likewise, meals heavy in dairy and vegetables can increase bloating.  Drink plenty of fluids but you should avoid alcoholic beverages for 24 hours.  ACTIVITY:  You should plan to take it easy for the rest of today and you should NOT DRIVE or use heavy machinery until tomorrow (because of the sedation medicines used during the test).    FOLLOW UP: Our staff will call the number listed on your records the next business day following your procedure to check on you and address any questions or concerns that you may have regarding the information given to you following your procedure. If we do not reach you, we will leave a message.  However, if you are feeling well and you are not experiencing any problems, there is no need to return our call.  We will assume that you have returned to your regular daily activities without incident.  If any biopsies were taken you will be contacted by phone or by letter within the next 1-3 weeks.  Please call us at [phone] if you have not heard about the biopsies in 3 weeks.    SIGNATURES/CONFIDENTIALITY: You and/or your care partner have signed paperwork which will be entered into your electronic medical record.  These signatures attest to the fact that that the information above on your After Visit Summary has been reviewed and is understood.  Full responsibility of the confidentiality of this discharge information lies with you and/or your care-partner.

## 2015-12-12 NOTE — Progress Notes (Signed)
Called to room to assist during endoscopic procedure.  Patient ID and intended procedure confirmed with present staff. Received instructions for my participation in the procedure from the performing physician.  

## 2015-12-12 NOTE — Progress Notes (Signed)
To PACU pt awake and alert, Report to RN

## 2015-12-13 ENCOUNTER — Telehealth: Payer: Self-pay | Admitting: *Deleted

## 2015-12-13 NOTE — Telephone Encounter (Signed)
  Follow up Call-  Call back number 12/12/2015 10/11/2014 10/12/2013  Post procedure Call Back phone  # [phone]  Permission to leave phone message Yes Yes Yes  Some recent data might be hidden     Patient questions:  Do you have a fever, pain , or abdominal swelling? No. Pain Score  0 *  Have you tolerated food without any problems? Yes.    Have you been able to return to your normal activities? Yes.    Do you have any questions about your discharge instructions: Diet   No. Medications  No. Follow up visit  No.  Do you have questions or concerns about your Care? No.  Actions: * If pain score is 4 or above: No action needed, pain <4.

## 2015-12-14 ENCOUNTER — Encounter: Payer: Self-pay | Admitting: Gastroenterology

## 2016-01-03 DIAGNOSIS — M19012 Primary osteoarthritis, left shoulder: Secondary | ICD-10-CM | POA: Diagnosis not present

## 2016-01-03 DIAGNOSIS — M25512 Pain in left shoulder: Secondary | ICD-10-CM | POA: Diagnosis not present

## 2016-01-03 DIAGNOSIS — M7541 Impingement syndrome of right shoulder: Secondary | ICD-10-CM | POA: Diagnosis not present

## 2016-01-03 DIAGNOSIS — M19011 Primary osteoarthritis, right shoulder: Secondary | ICD-10-CM | POA: Diagnosis not present

## 2016-01-11 DIAGNOSIS — L814 Other melanin hyperpigmentation: Secondary | ICD-10-CM | POA: Diagnosis not present

## 2016-01-11 DIAGNOSIS — Z85828 Personal history of other malignant neoplasm of skin: Secondary | ICD-10-CM | POA: Diagnosis not present

## 2016-02-06 DIAGNOSIS — K1370 Unspecified lesions of oral mucosa: Secondary | ICD-10-CM | POA: Diagnosis not present

## 2016-03-04 DIAGNOSIS — R7309 Other abnormal glucose: Secondary | ICD-10-CM | POA: Diagnosis not present

## 2016-03-04 DIAGNOSIS — Z6832 Body mass index (BMI) 32.0-32.9, adult: Secondary | ICD-10-CM | POA: Diagnosis not present

## 2016-03-04 DIAGNOSIS — C0689 Malignant neoplasm of overlapping sites of other parts of mouth: Secondary | ICD-10-CM | POA: Diagnosis not present

## 2016-03-04 DIAGNOSIS — E668 Other obesity: Secondary | ICD-10-CM | POA: Diagnosis not present

## 2016-03-04 DIAGNOSIS — I1310 Hypertensive heart and chronic kidney disease without heart failure, with stage 1 through stage 4 chronic kidney disease, or unspecified chronic kidney disease: Secondary | ICD-10-CM | POA: Diagnosis not present

## 2016-03-04 DIAGNOSIS — E784 Other hyperlipidemia: Secondary | ICD-10-CM | POA: Diagnosis not present

## 2016-03-04 DIAGNOSIS — N183 Chronic kidney disease, stage 3 (moderate): Secondary | ICD-10-CM | POA: Diagnosis not present

## 2016-03-04 DIAGNOSIS — I2699 Other pulmonary embolism without acute cor pulmonale: Secondary | ICD-10-CM | POA: Diagnosis not present

## 2016-03-04 DIAGNOSIS — Z23 Encounter for immunization: Secondary | ICD-10-CM | POA: Diagnosis not present

## 2016-03-04 DIAGNOSIS — I6521 Occlusion and stenosis of right carotid artery: Secondary | ICD-10-CM | POA: Diagnosis not present

## 2016-03-07 DIAGNOSIS — I10 Essential (primary) hypertension: Secondary | ICD-10-CM | POA: Diagnosis not present

## 2016-03-11 DIAGNOSIS — Z961 Presence of intraocular lens: Secondary | ICD-10-CM | POA: Diagnosis not present

## 2016-03-13 ENCOUNTER — Ambulatory Visit (INDEPENDENT_AMBULATORY_CARE_PROVIDER_SITE_OTHER): Payer: Medicare Other | Admitting: Gastroenterology

## 2016-03-13 ENCOUNTER — Telehealth: Payer: Self-pay | Admitting: Gastroenterology

## 2016-03-13 ENCOUNTER — Other Ambulatory Visit (INDEPENDENT_AMBULATORY_CARE_PROVIDER_SITE_OTHER): Payer: Medicare Other

## 2016-03-13 ENCOUNTER — Encounter: Payer: Self-pay | Admitting: Gastroenterology

## 2016-03-13 VITALS — BP 122/64 | Ht 68.0 in | Wt 219.8 lb

## 2016-03-13 DIAGNOSIS — I6522 Occlusion and stenosis of left carotid artery: Secondary | ICD-10-CM

## 2016-03-13 DIAGNOSIS — Z7901 Long term (current) use of anticoagulants: Secondary | ICD-10-CM | POA: Insufficient documentation

## 2016-03-13 DIAGNOSIS — K625 Hemorrhage of anus and rectum: Secondary | ICD-10-CM

## 2016-03-13 LAB — CBC WITH DIFFERENTIAL/PLATELET
BASOS ABS: 0.1 10*3/uL (ref 0.0–0.1)
Basophils Relative: 0.7 % (ref 0.0–3.0)
EOS ABS: 0.2 10*3/uL (ref 0.0–0.7)
Eosinophils Relative: 1.6 % (ref 0.0–5.0)
HCT: 42.6 % (ref 39.0–52.0)
Hemoglobin: 14.6 g/dL (ref 13.0–17.0)
LYMPHS ABS: 2 10*3/uL (ref 0.7–4.0)
Lymphocytes Relative: 18.3 % (ref 12.0–46.0)
MCHC: 34.2 g/dL (ref 30.0–36.0)
MCV: 94.3 fl (ref 78.0–100.0)
Monocytes Absolute: 1 10*3/uL (ref 0.1–1.0)
Monocytes Relative: 9.7 % (ref 3.0–12.0)
NEUTROS ABS: 7.4 10*3/uL (ref 1.4–7.7)
NEUTROS PCT: 69.7 % (ref 43.0–77.0)
PLATELETS: 176 10*3/uL (ref 150.0–400.0)
RBC: 4.52 Mil/uL (ref 4.22–5.81)
RDW: 15 % (ref 11.5–15.5)
WBC: 10.6 10*3/uL — ABNORMAL HIGH (ref 4.0–10.5)

## 2016-03-13 NOTE — Progress Notes (Signed)
     03/13/2016 [name] [id number] 27-Jun-1940   History of Present Illness:  This is a 75 year old male who is known to Dr. Havery Moros for colonoscopy in July of this year at which time he had one polyp removed from the cecum, diverticulosis, and internal hemorrhoids. Polyp was a tubular adenoma on pathology. Prep was inadequate but he was able to see the cecum. He was placed in for 3 year colonoscopy recall. He presents to our office today with complaints of rectal bleeding that began this morning. He had 2 episodes of what he described as bright red blood with his stool this AM, the last at 9 or 9:30 this morning.  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  Denies abdominal pain, dizziness, etc.  Current Medications, Allergies, Past Medical History, Past Surgical History, Family History and Social History were reviewed in [employer] record.   Physical Exam: BP 122/64 (BP Location: Left Arm, Patient Position: Sitting, Cuff Size: Normal)   Ht 5\' 8"  (1.727 m)   Wt 219 lb 12.8 oz (99.7 kg)   BMI 33.42 kg/m  General: Well developed white male in no acute distress Head: Normocephalic and atraumatic Eyes:  Sclerae anicteric, conjunctiva pink  Ears: Normal auditory acuity Lungs: Clear throughout to auscultation Heart: Regular rate and rhythm Abdomen: Soft, non-distended.  Normal bowel sounds.  Non-tender. Rectal:  No external abnormalities noted but he does have some dried blood surrounding his anus.  DRE revealed maroon colored stool on exam glove.   Musculoskeletal: Symmetrical with no gross deformities  Extremities: No edema  Neurological: Alert oriented x 4, grossly non-focal Psychological:  Alert and cooperative. Normal mood and affect  Assessment and Recommendations: -Rectal bleeding:  Either hemorrhoidal or diverticular in the setting of chronic anticoagulation.  I have discussed with him regarding going to the hospital for  observation but he declined.  Will check CBC today and I have asked him to only take liquid diet this evening and then to call back in the AM with an update on his symptoms or if bleeding increases then he needs to go to the ED.  I asked that he not take his morning dose of Eliquis until he calls here in the morning.

## 2016-03-13 NOTE — Progress Notes (Signed)
Agree with assessment and plan. Patient with rectal bleeding this AM on Eliquis. Hgb stable. Unclear if this is a diverticular bleed or from hemorrhoids, he had both on colonoscopy, suspect this could be hemorrhoidal given stable Hgb. He should hold his Eliquis tomorrow AM or until the bleeding stops. If this persists we will need to repeat labs and he may need to go to the hospital, he declined at this time. Of note, his last colonscopy was a surveillance exam to survey the cecum for large piecemeal polypectomy done the year previous. While the prep was only fair the cecum was well visualized for this purpose.

## 2016-03-13 NOTE — Patient Instructions (Signed)
Please go to the basement level to have your labs drawn.  Call us back in the morning and ask for Buel Ream nurse.   Full liquids tonight.

## 2016-03-13 NOTE — Telephone Encounter (Signed)
Patient has had bright red blood in stool x 2 today. Patient is on blood thinners and history of internal hemorrhoids. Denies pain, fever or SOB. Patient anxious and wanted to be seen today. Scheduled with PA at 3:00 today.

## 2016-03-14 ENCOUNTER — Telehealth: Payer: Self-pay | Admitting: Gastroenterology

## 2016-03-14 NOTE — Telephone Encounter (Signed)
Pt states he is much better today. States there was a little blood in his stool this morning but it was just a little dark. Not anything like yesterday with all the bright red blood he was having. Pt wants to know if he should resume his eliquis. He has not taken it today. Please advise.

## 2016-03-14 NOTE — Telephone Encounter (Signed)
Thank you for calling him back!  I am glad to hear that the bleeding is resolving.  I would like him to just hold his Eliquis today and resume it tomorrow.  His Hgb from yesterday was fine.  Obviously needs to go to the ED if any significant bleeding occurs.  Thank you,  Jess

## 2016-03-14 NOTE — Telephone Encounter (Signed)
Spoke with pt and he is aware. 

## 2016-03-20 DIAGNOSIS — M7541 Impingement syndrome of right shoulder: Secondary | ICD-10-CM | POA: Diagnosis not present

## 2016-04-08 DIAGNOSIS — D235 Other benign neoplasm of skin of trunk: Secondary | ICD-10-CM | POA: Diagnosis not present

## 2016-04-08 DIAGNOSIS — Z85820 Personal history of malignant melanoma of skin: Secondary | ICD-10-CM | POA: Diagnosis not present

## 2016-04-08 DIAGNOSIS — L820 Inflamed seborrheic keratosis: Secondary | ICD-10-CM | POA: Diagnosis not present

## 2016-04-08 DIAGNOSIS — Z85828 Personal history of other malignant neoplasm of skin: Secondary | ICD-10-CM | POA: Diagnosis not present

## 2016-04-08 DIAGNOSIS — D1801 Hemangioma of skin and subcutaneous tissue: Secondary | ICD-10-CM | POA: Diagnosis not present

## 2016-04-08 DIAGNOSIS — L814 Other melanin hyperpigmentation: Secondary | ICD-10-CM | POA: Diagnosis not present

## 2016-04-08 DIAGNOSIS — L821 Other seborrheic keratosis: Secondary | ICD-10-CM | POA: Diagnosis not present

## 2016-04-16 DIAGNOSIS — I10 Essential (primary) hypertension: Secondary | ICD-10-CM | POA: Diagnosis not present

## 2016-04-30 DIAGNOSIS — K1370 Unspecified lesions of oral mucosa: Secondary | ICD-10-CM | POA: Diagnosis not present

## 2016-05-01 DIAGNOSIS — M7541 Impingement syndrome of right shoulder: Secondary | ICD-10-CM | POA: Diagnosis not present

## 2016-05-01 DIAGNOSIS — M25512 Pain in left shoulder: Secondary | ICD-10-CM | POA: Diagnosis not present

## 2016-05-10 ENCOUNTER — Encounter: Payer: Self-pay | Admitting: Family

## 2016-05-21 ENCOUNTER — Ambulatory Visit: Payer: Medicare Other | Admitting: Family

## 2016-05-21 ENCOUNTER — Encounter (HOSPITAL_COMMUNITY): Payer: Medicare Other

## 2016-05-28 ENCOUNTER — Ambulatory Visit (HOSPITAL_COMMUNITY)
Admission: RE | Admit: 2016-05-28 | Discharge: 2016-05-28 | Disposition: A | Discharge status: Home / Self Care | Payer: Medicare Other | Source: Ambulatory Visit | Attending: Family | Admitting: Family

## 2016-05-28 ENCOUNTER — Ambulatory Visit (INDEPENDENT_AMBULATORY_CARE_PROVIDER_SITE_OTHER): Payer: Medicare Other | Admitting: Family

## 2016-05-28 ENCOUNTER — Encounter: Payer: Self-pay | Admitting: Family

## 2016-05-28 VITALS — BP 109/65 | HR 90 | Temp 98.3°F | Resp 18 | Ht 70.0 in | Wt 215.0 lb

## 2016-05-28 DIAGNOSIS — I6522 Occlusion and stenosis of left carotid artery: Secondary | ICD-10-CM | POA: Insufficient documentation

## 2016-05-28 DIAGNOSIS — I6523 Occlusion and stenosis of bilateral carotid arteries: Secondary | ICD-10-CM | POA: Diagnosis not present

## 2016-05-28 DIAGNOSIS — Z9889 Other specified postprocedural states: Secondary | ICD-10-CM

## 2016-05-28 DIAGNOSIS — F17200 Nicotine dependence, unspecified, uncomplicated: Secondary | ICD-10-CM | POA: Diagnosis not present

## 2016-05-28 NOTE — Patient Instructions (Addendum)
Stroke Prevention Some medical conditions and behaviors are associated with an increased chance of having a stroke. You may prevent a stroke by making healthy choices and managing medical conditions. How can I reduce my risk of having a stroke?  Stay physically active. Get at least 30 minutes of activity on most or all days.  Do not smoke. It may also be helpful to avoid exposure to secondhand smoke.  Limit alcohol use. Moderate alcohol use is considered to be:  No more than 2 drinks per day for men.  No more than 1 drink per day for nonpregnant women.  Eat healthy foods. This involves:  Eating 5 or more servings of fruits and vegetables a day.  Making dietary changes that address high blood pressure (hypertension), high cholesterol, diabetes, or obesity.  Manage your cholesterol levels.  Making food choices that are high in fiber and low in saturated fat, trans fat, and cholesterol may control cholesterol levels.  Take any prescribed medicines to control cholesterol as directed by your health care provider.  Manage your diabetes.  Controlling your carbohydrate and sugar intake is recommended to manage diabetes.  Take any prescribed medicines to control diabetes as directed by your health care provider.  Control your hypertension.  Making food choices that are low in salt (sodium), saturated fat, trans fat, and cholesterol is recommended to manage hypertension.  Ask your health care provider if you need treatment to lower your blood pressure. Take any prescribed medicines to control hypertension as directed by your health care provider.  If you are 18-39 years of age, have your blood pressure checked every 3-5 years. If you are 40 years of age or older, have your blood pressure checked every year.  Maintain a healthy weight.  Reducing calorie intake and making food choices that are low in sodium, saturated fat, trans fat, and cholesterol are recommended to manage  weight.  Stop drug abuse.  Avoid taking birth control pills.  Talk to your health care provider about the risks of taking birth control pills if you are over 35 years old, smoke, get migraines, or have ever had a blood clot.  Get evaluated for sleep disorders (sleep apnea).  Talk to your health care provider about getting a sleep evaluation if you snore a lot or have excessive sleepiness.  Take medicines only as directed by your health care provider.  For some people, aspirin or blood thinners (anticoagulants) are helpful in reducing the risk of forming abnormal blood clots that can lead to stroke. If you have the irregular heart rhythm of atrial fibrillation, you should be on a blood thinner unless there is a good reason you cannot take them.  Understand all your medicine instructions.  Make sure that other conditions (such as anemia or atherosclerosis) are addressed. Get help right away if:  You have sudden weakness or numbness of the face, arm, or leg, especially on one side of the body.  Your face or eyelid droops to one side.  You have sudden confusion.  You have trouble speaking (aphasia) or understanding.  You have sudden trouble seeing in one or both eyes.  You have sudden trouble walking.  You have dizziness.  You have a loss of balance or coordination.  You have a sudden, severe headache with no known cause.  You have new chest pain or an irregular heartbeat. Any of these symptoms may represent a serious problem that is an emergency. Do not wait to see if the symptoms will go away.   Get medical help at once. Call your local emergency services (911 in [place] Do not drive yourself to the hospital.  This information is not intended to replace advice given to you by your health care provider. Make sure you discuss any questions you have with your health care provider. Document Released: 06/13/2004 Document Revised: 10/12/2015 Document Reviewed: 11/06/2012 Elsevier  Interactive Patient Education  2017 Elsevier Inc.     Steps to Quit Smoking Smoking tobacco can be bad for your health. It can also affect almost every organ in your body. Smoking puts you and people around you at risk for many serious long-lasting (chronic) diseases. Quitting smoking is hard, but it is one of the best things that you can do for your health. It is never too late to quit. What are the benefits of quitting smoking? When you quit smoking, you lower your risk for getting serious diseases and conditions. They can include:  Lung cancer or lung disease.  Heart disease.  Stroke.  Heart attack.  Not being able to have children (infertility).  Weak bones (osteoporosis) and broken bones (fractures). If you have coughing, wheezing, and shortness of breath, those symptoms may get better when you quit. You may also get sick less often. If you are pregnant, quitting smoking can help to lower your chances of having a baby of low birth weight. What can I do to help me quit smoking? Talk with your doctor about what can help you quit smoking. Some things you can do (strategies) include:  Quitting smoking totally, instead of slowly cutting back how much you smoke over a period of time.  Going to in-person counseling. You are more likely to quit if you go to many counseling sessions.  Using resources and support systems, such as:  Online chats with a counselor.  Phone quitlines.  Printed self-help materials.  Support groups or group counseling.  Text messaging programs.  Mobile phone apps or applications.  Taking medicines. Some of these medicines may have nicotine in them. If you are pregnant or breastfeeding, do not take any medicines to quit smoking unless your doctor says it is okay. Talk with your doctor about counseling or other things that can help you. Talk with your doctor about using more than one strategy at the same time, such as taking medicines while you are  also going to in-person counseling. This can help make quitting easier. What things can I do to make it easier to quit? Quitting smoking might feel very hard at first, but there is a lot that you can do to make it easier. Take these steps:  Talk to your family and friends. Ask them to support and encourage you.  Call phone quitlines, reach out to support groups, or work with a counselor.  Ask people who smoke to not smoke around you.  Avoid places that make you want (trigger) to smoke, such as:  Bars.  Parties.  Smoke-break areas at work.  Spend time with people who do not smoke.  Lower the stress in your life. Stress can make you want to smoke. Try these things to help your stress:  Getting regular exercise.  Deep-breathing exercises.  Yoga.  Meditating.  Doing a body scan. To do this, close your eyes, focus on one area of your body at a time from head to toe, and notice which parts of your body are tense. Try to relax the muscles in those areas.  Download or buy apps on your mobile phone or tablet   that can help you stick to your quit plan. There are many free apps, such as QuitGuide from the CDC (Centers for Disease Control and Prevention). You can find more support from smokefree.gov and other websites. This information is not intended to replace advice given to you by your health care provider. Make sure you discuss any questions you have with your health care provider. Document Released: 03/02/2009 Document Revised: 01/02/2016 Document Reviewed: 09/20/2014 Elsevier Interactive Patient Education  2017 Elsevier Inc.  

## 2016-05-28 NOTE — Progress Notes (Signed)
Chief Complaint: Follow up Extracranial Carotid Artery Stenosis   History of Present Illness  [name] is a 76 y.o. male patient of Dr. Donnetta Hutching who is status post right carotid endarterectomy on 10/01/2013 for severe (80% stenosis) asymptomatic disease.  He returns today for follow up.  The patient denies any history of TIA or stroke symptoms, specifically he denies a history of amaurosis fugax or monocular blindness, unilateral facial drooping,  hemiplegia, or receptive or expressive aphasia.   The patient states he has "a pinched nerve" in his back and has pain in both of his hips from this, sees [facility] for this. Pt states his right knee was surgically replaced in 2016 and left was replaced in 2012.  His right shoulder sustained an injury in the past and he receives injections in the shoulder for pain.  He is physically active, exercises 3 days/week, walks his dog daily about 30 minutes. He scuba dives.  Pt Diabetic: No Pt smoker: smoker (couple of cigars daily, started smoking in his 20's, states he has no intention of quitting) Pt states he consumes about 14 ETOH drinks/week  He is trying to lose weight, states at his heaviest he was 240 pounds.   Pt meds include: Statin : Yes ASA: no Other anticoagulants/antiplatelets: Yes, had a DVT in his left leg in 2016, about 4 months after right knee replaced, which resulted in a PE, takes Eliquis    Past Medical History:  Diagnosis Date  . Arthritis   . Blood transfusion without reported diagnosis   . Cataract    having cataract surgery July 2016  . Chronic kidney disease    hx kidney stones  . Colon polyps   . Complication of anesthesia    after surgery 07-2013 unable to sleep for 3 days  . GERD (gastroesophageal reflux disease)   . Hyperlipidemia   . Hypertension   . Prostate hypertrophy   . Pulmonary embolism ([place]) Aug. 2016  . Skin cancer    squamous cell, 1 melanoma left shoulder  . Sleep  apnea    had suegery in 2000 for sleep apnea, no CPAP needed    Social History Social History  Substance Use Topics  . Smoking status: Light Tobacco Smoker    Years: 50.00    Types: Cigars  . Smokeless tobacco: Never Used     Comment: pt states he smokes a couple of cigars per day. Never smoke cigarettes  . Alcohol use 12.6 oz/week    7 Glasses of wine, 14 Shots of liquor per week    Family History Family History  Problem Relation Age of Onset  . Diverticulosis Mother   . Skin cancer Mother   . Varicose Veins Mother   . Hyperlipidemia Father   . Heart disease Father   . Heart attack Father   . Colon cancer Neg Hx   . Esophageal cancer Neg Hx   . Rectal cancer Neg Hx   . Stomach cancer Neg Hx     Surgical History Past Surgical History:  Procedure Laterality Date  . CAROTID ENDARTERECTOMY Right Oct 01, 2013   CE  . COLONOSCOPY    . ENDARTERECTOMY Right 10/01/2013   Procedure: ENDARTERECTOMY CAROTID-RIGHT;  Surgeon: Rosetta Posner, MD;  Location: [facility];  Service: Vascular;  Laterality: Right;  . ESOPHAGOGASTRODUODENOSCOPY ENDOSCOPY  10-12-2013  . EXCISION ORAL LESION WITH CO2 LASER N/A 09/08/2015   Procedure: EXCISION ORAL LESION WITH CO2 LASER;  Surgeon: Jerrell Belfast, MD;  [facility];  Service: ENT;  Laterality: N/A;  . EYE SURGERY Bilateral    cataracts  . JOINT REPLACEMENT Right September 02, 2014   Knee  . JOINT REPLACEMENT Left 2012   Knee  . PATCH ANGIOPLASTY Right 10/01/2013   Procedure: PATCH ANGIOPLASTY USING HEMASHIELD FINESSE PATCH;  Surgeon: Rosetta Posner, MD;  Location: [facility];  Service: Vascular;  Laterality: Right;  . removal of melonomia Left    shoulder  . REPLACEMENT TOTAL KNEE Left   . RHINOPLASTY     1999  . RHINOPLASTY    . SKIN CANCER EXCISION     squamous cell cancer under tongue  . SKIN SURGERY     squamous cell carcinoma removed in various places,melanoma removed from L shoulder  . sleep apnea surgery  2000  . TOTAL KNEE ARTHROPLASTY  Right 09/02/2014   Procedure: RIGHT TOTAL KNEE ARTHROPLASTY;  Surgeon: Netta Cedars, MD;  Location: [facility];  Service: Orthopedics;  Laterality: Right;    Allergies  Allergen Reactions  . Cefaclor Other (See Comments)     skin split  . Penicillins Rash    Current Outpatient Prescriptions  Medication Sig Dispense Refill  . acetaminophen (TYLENOL) 650 MG CR tablet Take 1,300 mg by mouth 2 (two) times daily as needed for pain.    Marland Kitchen apixaban (ELIQUIS) 5 MG TABS tablet Take 10 mg by mouth 2 (two) times daily.     . Cholecalciferol (VITAMIN D PO) Take 10,000 Units by mouth daily. 5000 units per tablet    . esomeprazole (NEXIUM) 40 MG capsule Take 40 mg by mouth daily as needed (acid reflux).     . fenofibrate (TRICOR) 145 MG tablet Take 145 mg by mouth daily.    . fluticasone (FLONASE) 50 MCG/ACT nasal spray Place 2 sprays into both nostrils daily as needed for allergies or rhinitis.    Marland Kitchen losartan-hydrochlorothiazide (HYZAAR) 100-25 MG per tablet Take 1 tablet by mouth daily.    . probenecid (BENEMID) 500 MG tablet Take 500 mg by mouth daily.    . rosuvastatin (CRESTOR) 10 MG tablet Take 10 mg by mouth daily.    . tadalafil (CIALIS) 5 MG tablet Take 5 mg by mouth daily.     No current facility-administered medications for this visit.     Review of Systems : See HPI for pertinent positives and negatives.  Physical Examination  Vitals:   05/28/16 1243 05/28/16 1246  BP: 114/82 109/65  Pulse: 90 90  Resp: 18   Temp:  98.3 F (36.8 C)  TempSrc:  Oral  SpO2: 96%   Weight: 215 lb (97.5 kg)   Height: 5\' 10"  (1.778 m)    Body mass index is 30.85 kg/m.  General: WDWN obese male in NAD GAIT: normal Eyes: PERRLA Pulmonary: Respirations are non-labored, CTAB, fair air movement in all fields.  Cardiac: regular rhythm, no detected murmur.  VASCULAR EXAM Carotid Bruits Right Left   Negative Negative   Aorta is not palpable. Radial pulses: right is 2+ palpable, left is  faintly palpable but left brachial is 2+ palpable.      LE Pulses Right Left   POPLITEAL not palpable  not palpable   POSTERIOR TIBIAL not palpable   palpable    DORSALIS PEDIS  ANTERIOR TIBIAL palpable  palpable     Gastrointestinal: soft, nontender, BS WNL, no r/g, no palpable masses.  Musculoskeletal: No muscle atrophy/wasting. M/S 5/5 throughout, extremities without ischemic changes.  Neurologic: A&O X 3; Appropriate Affect, Speech  is normal CN 2-12 intact, pain and light touch intact in extremities, motor exam as listed above.     Assessment: ARDIT BISKUP is a 76 y.o. male who is status post right carotid endarterectomy on 10/01/2013 for severe (80% stenosis) asymptomatic disease. He has no history of stroke or TIA.  DATA  Today's carotid Duplex suggests a patent right carotid endarterectomy site with no right internal carotid artery stenosis and <40% stenosis of the left proximal internal carotid artery. Bilateral vertebral artery flow is antegrade.  Bilateral subclavian artery waveforms are normal.  No significant change in the bilateral internal carotid artery velocities noted when compared to the previous exam on 05-08-15.   Plan: The patient was counseled re smoking cessation and offered several free resources re smoking cessation but he declined.  Follow-up in 1 year with Carotid Duplex scan.   I discussed in depth with the patient the nature of atherosclerosis, and emphasized the importance of maximal medical management including strict control of blood pressure, blood glucose, and lipid levels, obtaining regular exercise, and cessation of smoking.  The patient is aware that without maximal medical management the underlying atherosclerotic disease process will progress, limiting the benefit of any  interventions. The patient was given information about stroke prevention and what symptoms should prompt the patient to seek immediate medical care. Thank you for allowing [place] to participate in this patient's care.  Clemon Chambers, RN, MSN, FNP-C [facility] Office: [phone]  Clinic Physician: Early  05/28/16 12:57 PM

## 2016-05-28 NOTE — Addendum Note (Signed)
Addended by: Lianne Cure A on: 05/28/2016 01:44 PM   Modules accepted: Orders

## 2016-06-17 DIAGNOSIS — I10 Essential (primary) hypertension: Secondary | ICD-10-CM | POA: Diagnosis not present

## 2016-06-17 DIAGNOSIS — I6523 Occlusion and stenosis of bilateral carotid arteries: Secondary | ICD-10-CM | POA: Diagnosis not present

## 2016-06-17 DIAGNOSIS — E782 Mixed hyperlipidemia: Secondary | ICD-10-CM | POA: Diagnosis not present

## 2016-06-18 ENCOUNTER — Encounter: Payer: Self-pay | Admitting: Internal Medicine

## 2016-06-18 ENCOUNTER — Ambulatory Visit (INDEPENDENT_AMBULATORY_CARE_PROVIDER_SITE_OTHER): Payer: Medicare Other | Admitting: Internal Medicine

## 2016-06-18 ENCOUNTER — Other Ambulatory Visit: Payer: Medicare Other

## 2016-06-18 VITALS — BP 108/62 | HR 107 | Ht 70.0 in | Wt 206.0 lb

## 2016-06-18 DIAGNOSIS — I6522 Occlusion and stenosis of left carotid artery: Secondary | ICD-10-CM

## 2016-06-18 DIAGNOSIS — F17210 Nicotine dependence, cigarettes, uncomplicated: Secondary | ICD-10-CM | POA: Diagnosis not present

## 2016-06-18 DIAGNOSIS — Z86711 Personal history of pulmonary embolism: Secondary | ICD-10-CM

## 2016-06-18 DIAGNOSIS — Z129 Encounter for screening for malignant neoplasm, site unspecified: Secondary | ICD-10-CM

## 2016-06-18 NOTE — Progress Notes (Signed)
Subjective:     Patient ID: [name], male   DOB: 11-12-1940, 76 y.o.   MRN: [id number]  HPI    IOV 01/04/2015  Chief Complaint  Patient presents with  . Pulmonary Consult    Pt referred by Dr. Shon Baton for PE. Pt denies SOB and CP/tightness. Pt c/o heomptysis, decreasing in blood volume that pt is coughing up.  Pt states the hemoptysis started on 8.8.16 with pain in right shoulder.     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 He had negative cardiac stress test in March 2016. In April 2016 had right total knee replacement. After that he has been pretty active. He denies any long trips. Around first week of August 2016 noticed left calf swelling that then improved. Then on 12/26/2014 started having right acute scapular pain that was pleuritic and severe associated with hemoptysis. This is per his history and also per review of referring physician's chart which echo the same information. Physical exam at primary care physician noticed bibasal crackles. He was subjected to CT angiography that showed bilateral saddle emboli. I do not have the visual images with me but I do have the report. The CT scan was done 12/27/2014 at [facility] [address] [place]. Reports says but large bilateral pulmonary emboli in the distal main pulmonary arteries as well as lobar arteries. There is associated scarring and atelectasis. This opacity in the right middle lobe which could represent lung infarct or residual scarring. He was then started on ELIQUIS herapy as an outpatient. He was never on oxygen therapy. He was never hospitalized. Since then he significantly improved. Hemoptysis is resolved. Right infrascapular pain is also improved significantly. Currently he is feeling fine and is actively working. He denies any syncope orany problems. 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 Testosterone patch    OV 07/20/2015  Chief Complaint  Patient presents with  . Follow-up    Pt denies SOB, cough, and CP/tightness. Pt denies any current complaints at this time.     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 Several risk factors noted above.  He has had no further problems. No dyspnea. No bleeding issues. No hemoptysis no chest pain no wheezing no cough. He is no longer on the testosterone patch but wanted about restarting if he felt that it would be okay. Other risk factors of being overweight and smoking cigars continue. In addition over the summer he plans to go scuba diving in the [place]. He says that he will prepare himself to cut down risk of injury. He has been advised that he needs lifelong anticoagulation by his primary care physician. He also says that in the interim he has had several CT chest to follow-up resolution of the blood clot because he desired dose. He showed me one recent CT chest which I visualize personally and it looks like there is no blood clot. In addition Pulm parenchyma does not show any lung cancer.  OV 06/18/2016  Chief Complaint  Patient presents with  . Follow-up    Pt states he has had not breahting issues since last OV in 07/2015. Pt states he does have a prod cough with clear mucus. Pt denies SOB and  CP/tightness.   Follow-up for pulmonary embolism in August 2016. Risk factors documented below. THESE continues to be overweight and he continues to smoke. He also has bilateral varicose veins. He's no longer on the testosterone patch. He has not had any knee surgery. He did go to [place] and did scuba diving without any trauma. He plans to go scuba diving again in March 2018. He's not had any problems with eliquis and he does not mind continuing this even with scuba diving. He  is open to having d-dimer blood test. Is also open to continuing antibiotic regulation lifelong if it means avoiding the risk of pulmonary embolism  Risk factors for PE August 2016 - submassiuve  - Remote history of skin cancer - squamous cell and melanoma  - Overweight  - Smoker  - Recent knee surgery - April 2016 - Bilateral varicose veins  -Testosterone patch - stopped in 2016   History of smoking: He has greater than 50 pack smoking history. He continues to smoke. He says his recent cardiology evaluation which was negative and his been cleared for 2 years. He finds it very difficult to quit  Cancer screening: He wants to get cancer screening of the lungs done. Last CT chest was 2016. [facility]   [id number]   has a past medical history of Arthritis; Blood transfusion without reported diagnosis; Cataract; Chronic kidney disease; Colon polyps; Complication of anesthesia; GERD (gastroesophageal reflux disease); Hyperlipidemia; Hypertension; Prostate hypertrophy; Pulmonary embolism ([place]) (Aug. 2016); Skin cancer; and Sleep apnea.   reports that he has been smoking Cigars.  He has smoked for the past 50.00 years. He has never used smokeless tobacco.  Past Surgical History:  Procedure Laterality Date  . CAROTID ENDARTERECTOMY Right Oct 01, 2013   CE  . COLONOSCOPY    . ENDARTERECTOMY Right 10/01/2013   Procedure: ENDARTERECTOMY CAROTID-RIGHT;  Surgeon: Rosetta Posner, MD;  Location: [facility];  Service: Vascular;  Laterality: Right;  . ESOPHAGOGASTRODUODENOSCOPY ENDOSCOPY  10-12-2013  . EXCISION ORAL LESION WITH CO2 LASER N/A 09/08/2015   Procedure: EXCISION ORAL LESION WITH CO2 LASER;  Surgeon: Jerrell Belfast, MD;  Location: [facility];  Service: ENT;  Laterality: N/A;  . EYE SURGERY Bilateral    cataracts  . JOINT REPLACEMENT Right September 02, 2014   Knee  . JOINT REPLACEMENT Left 2012   Knee  . PATCH ANGIOPLASTY Right 10/01/2013   Procedure: PATCH  ANGIOPLASTY USING HEMASHIELD FINESSE PATCH;  Surgeon: Rosetta Posner, MD;  Location: [facility];  Service: Vascular;  Laterality: Right;  . removal of melonomia Left    shoulder  . REPLACEMENT TOTAL KNEE Left   . RHINOPLASTY     1999  . RHINOPLASTY    . SKIN CANCER EXCISION     squamous cell cancer under tongue  . SKIN SURGERY     squamous cell carcinoma removed in various places,melanoma removed from L shoulder  . sleep apnea surgery  2000  . TOTAL KNEE ARTHROPLASTY Right 09/02/2014   Procedure: RIGHT TOTAL KNEE ARTHROPLASTY;  Surgeon: Netta Cedars, MD;  Location: [facility];  Service: Orthopedics;  Laterality: Right;    Allergies  Allergen Reactions  . Cefaclor Other (See Comments)     skin split  . Penicillins Rash    Immunization History  Administered Date(s) Administered  . Influenza Split 02/17/2014  . Influenza,inj,Quad PF,36+ Mos 01/19/2015, 02/18/2016  . Pneumococcal-Unspecified 05/20/2012  . Zoster 05/20/2012    Family History  Problem Relation Age of Onset  . Diverticulosis Mother   . Skin cancer Mother   . Varicose Veins Mother   . Hyperlipidemia Father   . Heart disease Father   . Heart attack Father   . Colon cancer Neg Hx   . Esophageal cancer Neg Hx   . Rectal cancer Neg Hx   . Stomach cancer Neg Hx      Current Outpatient Prescriptions:  .  acetaminophen (TYLENOL) 650 MG CR tablet, Take 1,300 mg by mouth 2 (two) times daily. , Disp: , Rfl:  .  apixaban (ELIQUIS) 5 MG TABS tablet, Take 10 mg by mouth 2 (two) times daily. , Disp: , Rfl:  .  Cholecalciferol (VITAMIN D PO), Take 10,000 Units by mouth daily. 5000 units per tablet, Disp: , Rfl:  .  esomeprazole (NEXIUM) 40 MG capsule, Take 40 mg by mouth daily as needed (acid reflux). , Disp: , Rfl:  .  fenofibrate (TRICOR) 145 MG tablet, Take 145 mg by mouth daily., Disp: , Rfl:  .  fluticasone (FLONASE) 50 MCG/ACT nasal spray, Place 2 sprays into both nostrils daily as needed for allergies or rhinitis., Disp: ,  Rfl:  .  losartan-hydrochlorothiazide (HYZAAR) 100-25 MG per tablet, Take 1 tablet by mouth daily., Disp: , Rfl:  .  probenecid (BENEMID) 500 MG tablet, Take 500 mg by mouth daily., Disp: , Rfl:  .  rosuvastatin (CRESTOR) 10 MG tablet, Take 10 mg by mouth daily., Disp: , Rfl:  .  tadalafil (CIALIS) 5 MG tablet, Take 5 mg by mouth daily., Disp: , Rfl:    Review of Systems     Objective:   Physical Exam  Constitutional: He is oriented to person, place, and time. He appears well-developed and well-nourished. No distress.  HENT:  Head: Normocephalic and atraumatic.  Right Ear: External ear normal.  Left Ear: External ear normal.  Mouth/Throat: Oropharynx is clear and moist. No oropharyngeal exudate.  Eyes: Conjunctivae and EOM are normal. Pupils are equal, round, and reactive to light. Right eye exhibits no discharge. Left eye exhibits no discharge. No scleral icterus.  Neck: Normal range of motion. Neck supple. No JVD present. No tracheal deviation present. No thyromegaly present.  Cardiovascular: Normal rate, regular rhythm and intact distal pulses.  Exam reveals no gallop and no friction rub.   No murmur heard. Pulmonary/Chest: Effort normal and breath sounds normal. No respiratory distress. He has no wheezes. He has no rales. He exhibits no tenderness.  Abdominal: Soft. Bowel sounds are normal. He exhibits no distension and no mass. There is no tenderness. There is no rebound and no guarding.  Musculoskeletal: Normal range of motion. He exhibits no edema or tenderness.  Lymphadenopathy:    He has no cervical adenopathy.  Neurological: He is alert and oriented to person, place, and time. He has normal reflexes. No cranial nerve deficit. Coordination normal.  Skin: Skin is warm and dry. No rash noted. He is not diaphoretic. No erythema. No pallor.  Psychiatric: He has a normal mood and affect. His behavior is normal. Judgment and thought content normal.  Nursing note and vitals  reviewed.   Vitals:   06/18/16 0911  BP: 108/62  Pulse: (!) 107  SpO2: 98%  Weight: 206 lb (93.4 kg)  Height: 5\' 10"  (1.778 m)    Estimated body mass index is 29.56 kg/m as calculated from the following:   Height as of this encounter: 5\' 10"  (1.778 m).   Weight as of this encounter: 206  lb (93.4 kg).      Assessment:       ICD-9-CM ICD-10-CM   1. History of pulmonary embolism V12.55 Z86.711 D-dimer, quantitative (not at [facility])  2. Smoking greater than 40 pack years 305.1 F17.210 CT Chest Wo Contrast  3. Screening for cancer V76.9 Z12.9 CT Chest Wo Contrast    #lung cancer screening I discussed screening Ct chest for early detection of lung cancer Explained that in age 77-75 and smoking history, annual low dose CT chest can pick up lung cancer early and has potential to save lives and cure lung cancer This is similar in concept to screening mammogram, colonoscopies and pap smears I explained Ct scan is low dose radiation I explained early lung cancer asymptomatic and only way to  detect is CT  With the real advantage that early lung cancer is curable through radiation or surgery I explained CT superior to CXR I explained that false positives are present and can incur cost and workup like biopsies, additional scan but benefit outweighs risk I recommend one a year for a minimum of 3 years til age 74      Plan:     History of pulmonary embolism - continue elqiuis  - do d-dimer blood work 06/18/2016 - will call with results to advise of rrecurrence risk  Smoking greater than 40 pack years Screening for cancer  - do low dose cT [facility]   > 50% of this > 25 min visit spent in face to face counseling or coordination of care   Dr. Brand Males, M.D., [facility].C.P Pulmonary and Critical Care Medicine Staff Physician [place] [facility] Pager: [phone], If no answer or between  15:00h - 7:00h: call [phone]  06/18/2016 9:31 AM

## 2016-06-18 NOTE — Patient Instructions (Signed)
History of pulmonary embolism - continue elqiuis  - do d-dimer blood work 06/18/2016 - will call with results to advise of rrecurrence risk  Smoking greater than 40 pack years Screening for cancer  - do low dose cT chest wo contrast [facility]

## 2016-06-19 ENCOUNTER — Telehealth: Payer: Self-pay | Admitting: Internal Medicine

## 2016-06-19 LAB — D-DIMER, QUANTITATIVE (NOT AT ARMC): D DIMER QUANT: 0.33 ug{FEU}/mL (ref <0.50)

## 2016-06-19 NOTE — Telephone Encounter (Signed)
LMTCB

## 2016-06-19 NOTE — Telephone Encounter (Signed)
D-dimer normal. Risk for PE recurence is low. So choices  1)  Atleast during scuba diving days I recommend for those days when he is exposed to trauma to do eliquis once a day or totally stop.  2)  Otherwise, even now he can go to eliquis once a day  3) FU in sept 2018  Thanks  Dr. Brand Males, M.D., [facility].C.P Pulmonary and Critical Care Medicine Staff Physician [place] [facility] Pager: [phone], If no answer or between  15:00h - 7:00h: call [phone]  06/19/2016 2:20 PM

## 2016-06-19 NOTE — Telephone Encounter (Signed)
Pt called back and he stated that he was in the office yesterday for an appt and MR wanted him to be scheduled for a CT scan.  He stated that the lady told him that she would call him to schedule this and he has not heard anything and he is flying out tomorrow and needs to get this scheduled.  Pt is requesting that someone call him at [phone] to get this scheduled for him.  thanks

## 2016-06-20 NOTE — Telephone Encounter (Signed)
Pt os schedi;ed for 06/[facility] he is aware and order has been faxed Joellen [place]

## 2016-06-20 NOTE — Telephone Encounter (Signed)
Called and spoke to pt. Informed him of the results and recs per MR. Pt verbalized understanding and denied any further questions or concerns at this time.  

## 2016-06-25 DIAGNOSIS — Z86711 Personal history of pulmonary embolism: Secondary | ICD-10-CM | POA: Diagnosis not present

## 2016-06-25 DIAGNOSIS — J984 Other disorders of lung: Secondary | ICD-10-CM | POA: Diagnosis not present

## 2016-06-25 DIAGNOSIS — Z87891 Personal history of nicotine dependence: Secondary | ICD-10-CM | POA: Diagnosis not present

## 2016-07-17 DIAGNOSIS — M7541 Impingement syndrome of right shoulder: Secondary | ICD-10-CM | POA: Diagnosis not present

## 2016-07-17 DIAGNOSIS — M25512 Pain in left shoulder: Secondary | ICD-10-CM | POA: Diagnosis not present

## 2016-07-26 ENCOUNTER — Telehealth: Payer: Self-pay | Admitting: Internal Medicine

## 2016-07-26 NOTE — Telephone Encounter (Signed)
Outside ct reports from 06/25/16 on [name] 76 y.o. [birth year] is that bilateral biapical scarring; otherwise ok  Dr. Brand Males, M.D., [facility].C.P Pulmonary and Critical Care Medicine Staff Physician [place] [facility] Pager: [phone], If no answer or between  15:00h - 7:00h: call [phone]  07/26/2016 2:18 PM

## 2016-07-30 DIAGNOSIS — K1370 Unspecified lesions of oral mucosa: Secondary | ICD-10-CM | POA: Diagnosis not present

## 2016-07-30 DIAGNOSIS — J22 Unspecified acute lower respiratory infection: Secondary | ICD-10-CM | POA: Diagnosis not present

## 2016-07-31 DIAGNOSIS — N401 Enlarged prostate with lower urinary tract symptoms: Secondary | ICD-10-CM | POA: Diagnosis not present

## 2016-08-01 DIAGNOSIS — M19011 Primary osteoarthritis, right shoulder: Secondary | ICD-10-CM | POA: Diagnosis not present

## 2016-08-05 DIAGNOSIS — L570 Actinic keratosis: Secondary | ICD-10-CM | POA: Diagnosis not present

## 2016-08-07 DIAGNOSIS — N401 Enlarged prostate with lower urinary tract symptoms: Secondary | ICD-10-CM | POA: Diagnosis not present

## 2016-08-07 DIAGNOSIS — R350 Frequency of micturition: Secondary | ICD-10-CM | POA: Diagnosis not present

## 2016-08-07 DIAGNOSIS — N5201 Erectile dysfunction due to arterial insufficiency: Secondary | ICD-10-CM | POA: Diagnosis not present

## 2016-08-09 DIAGNOSIS — M19011 Primary osteoarthritis, right shoulder: Secondary | ICD-10-CM | POA: Diagnosis not present

## 2016-08-13 DIAGNOSIS — M19011 Primary osteoarthritis, right shoulder: Secondary | ICD-10-CM | POA: Diagnosis not present

## 2016-08-13 DIAGNOSIS — M25311 Other instability, right shoulder: Secondary | ICD-10-CM | POA: Diagnosis not present

## 2016-08-30 DIAGNOSIS — R7309 Other abnormal glucose: Secondary | ICD-10-CM | POA: Diagnosis not present

## 2016-08-30 DIAGNOSIS — M109 Gout, unspecified: Secondary | ICD-10-CM | POA: Diagnosis not present

## 2016-08-30 DIAGNOSIS — M859 Disorder of bone density and structure, unspecified: Secondary | ICD-10-CM | POA: Diagnosis not present

## 2016-08-30 DIAGNOSIS — E784 Other hyperlipidemia: Secondary | ICD-10-CM | POA: Diagnosis not present

## 2016-08-30 DIAGNOSIS — I10 Essential (primary) hypertension: Secondary | ICD-10-CM | POA: Diagnosis not present

## 2016-08-30 DIAGNOSIS — Z125 Encounter for screening for malignant neoplasm of prostate: Secondary | ICD-10-CM | POA: Diagnosis not present

## 2016-09-10 DIAGNOSIS — Z1389 Encounter for screening for other disorder: Secondary | ICD-10-CM | POA: Diagnosis not present

## 2016-09-10 DIAGNOSIS — E559 Vitamin D deficiency, unspecified: Secondary | ICD-10-CM | POA: Diagnosis not present

## 2016-09-10 DIAGNOSIS — R7309 Other abnormal glucose: Secondary | ICD-10-CM | POA: Diagnosis not present

## 2016-09-10 DIAGNOSIS — Z6831 Body mass index (BMI) 31.0-31.9, adult: Secondary | ICD-10-CM | POA: Diagnosis not present

## 2016-09-10 DIAGNOSIS — E291 Testicular hypofunction: Secondary | ICD-10-CM | POA: Diagnosis not present

## 2016-09-10 DIAGNOSIS — I6521 Occlusion and stenosis of right carotid artery: Secondary | ICD-10-CM | POA: Diagnosis not present

## 2016-09-10 DIAGNOSIS — I1310 Hypertensive heart and chronic kidney disease without heart failure, with stage 1 through stage 4 chronic kidney disease, or unspecified chronic kidney disease: Secondary | ICD-10-CM | POA: Diagnosis not present

## 2016-09-10 DIAGNOSIS — N183 Chronic kidney disease, stage 3 (moderate): Secondary | ICD-10-CM | POA: Diagnosis not present

## 2016-09-10 DIAGNOSIS — F17210 Nicotine dependence, cigarettes, uncomplicated: Secondary | ICD-10-CM | POA: Diagnosis not present

## 2016-09-10 DIAGNOSIS — I2699 Other pulmonary embolism without acute cor pulmonale: Secondary | ICD-10-CM | POA: Diagnosis not present

## 2016-09-10 DIAGNOSIS — C069 Malignant neoplasm of mouth, unspecified: Secondary | ICD-10-CM | POA: Diagnosis not present

## 2016-09-10 DIAGNOSIS — Z0000 Encounter for general adult medical examination without abnormal findings: Secondary | ICD-10-CM | POA: Diagnosis not present

## 2016-09-23 DIAGNOSIS — Z1212 Encounter for screening for malignant neoplasm of rectum: Secondary | ICD-10-CM | POA: Diagnosis not present

## 2016-09-24 NOTE — H&P (Signed)
[name] is an 76 y.o. male.    Chief Complaint: right shoulder pain  HPI: Pt is a 76 y.o. male complaining of right shoulder pain for multiple years. Pain had continually increased since the beginning. X-rays in the clinic show end-stage arthritic changes of the right shoulder. Pt has tried various conservative treatments which have failed to alleviate their symptoms, including injections and therapy. Various options are discussed with the patient. Risks, benefits and expectations were discussed with the patient. Patient understand the risks, benefits and expectations and wishes to proceed with surgery.   PCP:  Shon Baton, MD  D/C Plans: Home  PMH: Past Medical History:  Diagnosis Date  . Arthritis   . Blood transfusion without reported diagnosis   . Cataract    having cataract surgery July 2016  . Chronic kidney disease    hx kidney stones  . Colon polyps   . Complication of anesthesia    after surgery 07-2013 unable to sleep for 3 days  . GERD (gastroesophageal reflux disease)   . Hyperlipidemia   . Hypertension   . Prostate hypertrophy   . Pulmonary embolism ([place]) Aug. 2016  . Skin cancer    squamous cell, 1 melanoma left shoulder  . Sleep apnea    had suegery in 2000 for sleep apnea, no CPAP needed    PSH: Past Surgical History:  Procedure Laterality Date  . CAROTID ENDARTERECTOMY Right Oct 01, 2013   CE  . COLONOSCOPY    . ENDARTERECTOMY Right 10/01/2013   Procedure: ENDARTERECTOMY CAROTID-RIGHT;  Surgeon: Rosetta Posner, MD;  Location: [facility];  Service: Vascular;  Laterality: Right;  . ESOPHAGOGASTRODUODENOSCOPY ENDOSCOPY  10-12-2013  . EXCISION ORAL LESION WITH CO2 LASER N/A 09/08/2015   Procedure: EXCISION ORAL LESION WITH CO2 LASER;  Surgeon: Jerrell Belfast, MD;  Location: [facility];  Service: ENT;  Laterality: N/A;  . EYE SURGERY Bilateral    cataracts  . JOINT REPLACEMENT Right September 02, 2014   Knee  . JOINT REPLACEMENT Left 2012   Knee  . PATCH  ANGIOPLASTY Right 10/01/2013   Procedure: PATCH ANGIOPLASTY USING HEMASHIELD FINESSE PATCH;  Surgeon: Rosetta Posner, MD;  Location: [facility];  Service: Vascular;  Laterality: Right;  . removal of melonomia Left    shoulder  . REPLACEMENT TOTAL KNEE Left   . RHINOPLASTY     1999  . RHINOPLASTY    . SKIN CANCER EXCISION     squamous cell cancer under tongue  . SKIN SURGERY     squamous cell carcinoma removed in various places,melanoma removed from L shoulder  . sleep apnea surgery  2000  . TOTAL KNEE ARTHROPLASTY Right 09/02/2014   Procedure: RIGHT TOTAL KNEE ARTHROPLASTY;  Surgeon: Netta Cedars, MD;  Location: [facility];  Service: Orthopedics;  Laterality: Right;    Social History:  reports that he has been smoking Cigars.  He has smoked for the past 50.00 years. He has never used smokeless tobacco. He reports that he drinks about 12.6 oz of alcohol per week . He reports that he does not use drugs.  Allergies:  Allergies  Allergen Reactions  . Cefaclor Other (See Comments)     skin split  . Penicillins Rash    Medications: No current facility-administered medications for this encounter.    Current Outpatient Prescriptions  Medication Sig Dispense Refill  . acetaminophen (TYLENOL) 650 MG CR tablet Take 1,300 mg by mouth 2 (two) times daily.     Marland Kitchen apixaban (ELIQUIS)  5 MG TABS tablet Take 10 mg by mouth 2 (two) times daily.     . Cholecalciferol (VITAMIN D PO) Take 10,000 Units by mouth daily. 5000 units per tablet    . esomeprazole (NEXIUM) 40 MG capsule Take 40 mg by mouth daily as needed (acid reflux).     . fenofibrate (TRICOR) 145 MG tablet Take 145 mg by mouth daily.    . fluticasone (FLONASE) 50 MCG/ACT nasal spray Place 2 sprays into both nostrils daily as needed for allergies or rhinitis.    Marland Kitchen losartan-hydrochlorothiazide (HYZAAR) 100-25 MG per tablet Take 1 tablet by mouth daily.    . probenecid (BENEMID) 500 MG tablet Take 500 mg by mouth daily.    . rosuvastatin (CRESTOR) 10 MG  tablet Take 10 mg by mouth daily.    . tadalafil (CIALIS) 5 MG tablet Take 5 mg by mouth daily.      No results found for this or any previous visit (from the past 48 hour(s)). No results found.  ROS: Pain with rom of the right upper extremity  Physical Exam:  Alert and oriented 76 y.o. male in no acute distress Cranial nerves 2-12 intact Cervical spine: full rom with no tenderness, nv intact distally Chest: active breath sounds bilaterally, no wheeze rhonchi or rales Heart: regular rate and rhythm, no murmur Abd: non tender non distended with active bowel sounds Hip is stable with rom  Right shoulder with limited rom and weakness with ER and IR nv intact distally No rashes or edema  Assessment/Plan Assessment: right shoulder rotator cuff arthropathy  Plan: Patient will undergo a right reverse total shoulder by Dr. [name] at [facility]. Risks benefits and expectations were discussed with the patient. Patient understand risks, benefits and expectations and wishes to proceed.

## 2016-09-27 ENCOUNTER — Encounter (HOSPITAL_COMMUNITY): Payer: Self-pay

## 2016-09-27 ENCOUNTER — Encounter (HOSPITAL_COMMUNITY)
Admission: RE | Admit: 2016-09-27 | Discharge: 2016-09-27 | Disposition: A | Discharge status: Home / Self Care | Payer: Medicare Other | Source: Ambulatory Visit | Attending: Orthopedic Surgery | Admitting: Orthopedic Surgery

## 2016-09-27 DIAGNOSIS — Z01818 Encounter for other preprocedural examination: Secondary | ICD-10-CM | POA: Diagnosis not present

## 2016-09-27 DIAGNOSIS — M12811 Other specific arthropathies, not elsewhere classified, right shoulder: Secondary | ICD-10-CM | POA: Insufficient documentation

## 2016-09-27 HISTORY — DX: Anxiety disorder, unspecified: F41.9

## 2016-09-27 HISTORY — DX: Other seasonal allergic rhinitis: J30.2

## 2016-09-27 HISTORY — DX: Personal history of urinary calculi: Z87.442

## 2016-09-27 LAB — SURGICAL PCR SCREEN
MRSA, PCR: NEGATIVE
STAPHYLOCOCCUS AUREUS: NEGATIVE

## 2016-09-27 LAB — COMPREHENSIVE METABOLIC PANEL
ALBUMIN: 3.7 g/dL (ref 3.5–5.0)
ALK PHOS: 28 U/L — AB (ref 38–126)
ALT: 28 U/L (ref 17–63)
AST: 28 U/L (ref 15–41)
Anion gap: 11 (ref 5–15)
BILIRUBIN TOTAL: 0.7 mg/dL (ref 0.3–1.2)
BUN: 19 mg/dL (ref 6–20)
CALCIUM: 9.9 mg/dL (ref 8.9–10.3)
CO2: 23 mmol/L (ref 22–32)
Chloride: 104 mmol/L (ref 101–111)
Creatinine, Ser: 1.73 mg/dL — ABNORMAL HIGH (ref 0.61–1.24)
GFR calc Af Amer: 43 mL/min — ABNORMAL LOW (ref >60)
GFR calc non Af Amer: 37 mL/min — ABNORMAL LOW (ref >60)
GLUCOSE: 144 mg/dL — AB (ref 65–99)
Potassium: 3.9 mmol/L (ref 3.5–5.1)
Sodium: 138 mmol/L (ref 135–145)
TOTAL PROTEIN: 6.4 g/dL — AB (ref 6.5–8.1)

## 2016-09-27 LAB — CBC
HCT: 45.5 % (ref 39.0–52.0)
Hemoglobin: 14.6 g/dL (ref 13.0–17.0)
MCH: 31.5 pg (ref 26.0–34.0)
MCHC: 32.1 g/dL (ref 30.0–36.0)
MCV: 98.1 fL (ref 78.0–100.0)
Platelets: 166 10*3/uL (ref 150–400)
RBC: 4.64 MIL/uL (ref 4.22–5.81)
RDW: 14.7 % (ref 11.5–15.5)
WBC: 7.9 10*3/uL (ref 4.0–10.5)

## 2016-09-27 NOTE — Pre-Procedure Instructions (Signed)
ZAQUAN DUFFNER  09/27/2016      [facility] - [place], [place] - [address] [place] [address] Phone: [phone] Fax: [phone]    Your procedure is scheduled on 10/11/2016- Friday  Report to [facility] Admitting at 5:30 A.M.  Call this number if you have problems the morning of surgery:  [phone]   Remember:  Do not eat food or drink liquids after midnight.   Take these medicines the morning of surgery with A SIP OF WATER : use Advair, but if needed use Nexium, Tylenol, & Flonase    Do not wear jewelry   Do not wear lotions, powders, or perfumes, or deoderant.     Men may shave face and neck.   Do not bring valuables to the hospital.   [facility] is not responsible for any belongings or valuables.  Contacts, dentures or bridgework may not be worn into surgery.  Leave your suitcase in the car.  After surgery it may be brought to your room.  For patients admitted to the hospital, discharge time will be determined by your treatment team.  Patients discharged the day of surgery will not be allowed to drive home.   Name and phone number of your driver:   Family  Special instructions:  Special Instructions: [facility] - Preparing for Surgery  Before surgery, you can play an important role.  Because skin is not sterile, your skin needs to be as free of germs as possible.  You can reduce the number of germs on you skin by washing with CHG (chlorahexidine gluconate) soap before surgery.  CHG is an antiseptic cleaner which kills germs and bonds with the skin to continue killing germs even after washing.  Please DO NOT use if you have an allergy to CHG or antibacterial soaps.  If your skin becomes reddened/irritated stop using the CHG and inform your nurse when you arrive at Short Stay.  Do not shave (including legs and underarms) for at least 48 hours prior to the first CHG shower.  You may shave your face.  Please follow these  instructions carefully:   1.  Shower with CHG Soap the night before surgery and the  morning of Surgery.  2.  If you choose to wash your hair, wash your hair first as usual with your  normal shampoo.  3.  After you shampoo, rinse your hair and body thoroughly to remove the  Shampoo.  4.  Use CHG as you would any other liquid soap.  You can apply chg directly to the skin and wash gently with scrungie or a clean washcloth.  5.  Apply the CHG Soap to your body ONLY FROM THE NECK DOWN.    Do not use on open wounds or open sores.  Avoid contact with your eyes, ears, mouth and genitals (private parts).  Wash genitals (private parts)   with your normal soap.  6.  Wash thoroughly, paying special attention to the area where your surgery will be performed.  7.  Thoroughly rinse your body with warm water from the neck down.  8.  DO NOT shower/wash with your normal soap after using and rinsing off   the CHG Soap.  9.  Pat yourself dry with a clean towel.            10.  Wear clean pajamas.            11.  Place clean sheets  on your bed the night of your first shower and do not sleep with pets.  Day of Surgery  Do not apply any lotions/deodorants the morning of surgery.  Please wear clean clothes to the hospital/surgery center.  Please read over the following fact sheets that you were given. Pain Booklet, Coughing and Deep Breathing, MRSA Information and Surgical Site Infection Prevention

## 2016-09-27 NOTE — Progress Notes (Signed)
Pt. Reports that he is also followed by Dr. Chase Caller due to PE postop TJR- 2016. Pt. Denies all chest & breathing concerns today. Pt. Continues to & has recently had a trip to the [place] for scuba diving. Chart will be given to anesth. grp. To review.

## 2016-09-30 NOTE — Progress Notes (Addendum)
Anesthesia Chart Review:  Pt is a 76 year old male scheduled for reverse R shoulder arthroplasty in 10/11/2016 with Netta Cedars, M.D.  - PCP is Shon Baton, MD who is aware of upcoming surgery.  - Cardiologist is Kela Millin, MD  PMH includes:  HTN, PE (12/2014), hyperlipidemia, OSA (no CPAP since 2000 surgery), CKD (stage 3)melanoma, GERD. Current smoker. BMI 31.5. S/p oral lesion excision 09/08/15. S/p R TKA 09/02/14. S/p R CEA 10/01/13.  Medications include: eliquis, nexium, fenofibrate, advair,  losartan-hctz, crestor, cialis. Patient to stop Eliquis 3 days before surgery.   Preoperative labs reviewed.   - Cr 1.73, BUN 19. Baseline Cr appears to be ~1.4.  Will recheck DOS - PT will be obtained DOS  EKG 06/17/16: NSR  Carotid duplex 05/28/16:  - Patent R CEA with no R ICA hyperplasia or restenosis - 1-39% L ICA stenosis  Echo 01/05/15:  - Left ventricle: The cavity size was normal. Wall thickness was normal. Systolic function was vigorous. The estimated ejection fraction was in the range of 65% to 70%. Wall motion was normal; there were no regional wall motion abnormalities. Doppler parameters are consistent with abnormal left ventricular relaxation (grade 1 diastolic dysfunction). - Aortic valve: Cusp separation was reduced. Transvalvular velocity was minimally increased. There was no stenosis. Valve area (VTI): 2.89 cm^2. Valve area (Vmean): 2.74 cm^2. - Mitral valve: Moderately calcified annulus. Mildly thickened leaflets . - Left atrium: The atrium was mildly dilated. - Right ventricle: The cavity size was normal. Wall thickness was normal.  Nuclear stress test 06/03/14 (correspondence 09/05/14 in media tab):  - Perfusion imaging demonstrates very mild diaphragmatic attenuation artifact in the inferior wall. There was no evidence of ischemia or infarct. LV systolic function calculated by QGS was normal at 52% with normal endocardial thickening in all vascular territories. Low  risk study.  .If labs acceptable DOS, I anticipate pt can proceed as scheduled.   Willeen Cass, FNP-BC [facility]/Anesthesiology Phone: [phone] 10/01/2016 9:05 AM

## 2016-10-07 DIAGNOSIS — L570 Actinic keratosis: Secondary | ICD-10-CM | POA: Diagnosis not present

## 2016-10-10 NOTE — Anesthesia Preprocedure Evaluation (Addendum)
Anesthesia Evaluation  Patient identified by MRN, date of birth, ID band Patient awake    Reviewed: Allergy & Precautions, H&P , NPO status , Patient's Chart, lab work & pertinent test results  Airway Mallampati: III  TM Distance: >3 FB Neck ROM: Full    Dental no notable dental hx. (+) Teeth Intact, Dental Advisory Given   Pulmonary sleep apnea ,    Pulmonary exam normal breath sounds clear to auscultation       Cardiovascular Exercise Tolerance: Good hypertension, Pt. on medications  Rhythm:Regular Rate:Normal     Neuro/Psych Anxiety negative neurological ROS  negative psych ROS   GI/Hepatic Neg liver ROS, GERD  Medicated and Controlled,  Endo/Other  negative endocrine ROS  Renal/GU negative Renal ROS  negative genitourinary   Musculoskeletal  (+) Arthritis , Osteoarthritis,    Abdominal Normal abdominal exam  (+)   Peds  Hematology negative hematology ROS (+)   Anesthesia Other Findings   Reproductive/Obstetrics negative OB ROS                           Anesthesia Physical Anesthesia Plan  ASA: II  Anesthesia Plan: General and Regional   Post-op Pain Management:  Regional for Post-op pain and GA combined w/ Regional for post-op pain   Induction: Intravenous  Airway Management Planned: Oral ETT  Additional Equipment:   Intra-op Plan:   Post-operative Plan: Extubation in OR  Informed Consent: I have reviewed the patients History and Physical, chart, labs and discussed the procedure including the risks, benefits and alternatives for the proposed anesthesia with the patient or authorized representative who has indicated his/her understanding and acceptance.   Dental advisory given  Plan Discussed with: CRNA  Anesthesia Plan Comments:       Anesthesia Quick Evaluation

## 2016-10-11 ENCOUNTER — Encounter (HOSPITAL_COMMUNITY)
Admission: RE | Disposition: A | Discharge status: Home / Self Care | Payer: Self-pay | Source: Home / Self Care | Attending: Orthopedic Surgery

## 2016-10-11 ENCOUNTER — Inpatient Hospital Stay (HOSPITAL_COMMUNITY): Payer: Medicare Other | Admitting: Emergency Medicine

## 2016-10-11 ENCOUNTER — Encounter (HOSPITAL_COMMUNITY): Payer: Self-pay

## 2016-10-11 ENCOUNTER — Inpatient Hospital Stay (HOSPITAL_COMMUNITY)
Admission: RE | Admit: 2016-10-11 | Discharge: 2016-10-12 | DRG: 483 | Disposition: A | Discharge status: Home / Self Care | Payer: Medicare Other | Attending: Orthopedic Surgery | Admitting: Orthopedic Surgery

## 2016-10-11 ENCOUNTER — Inpatient Hospital Stay (HOSPITAL_COMMUNITY): Payer: Medicare Other

## 2016-10-11 DIAGNOSIS — G8918 Other acute postprocedural pain: Secondary | ICD-10-CM | POA: Diagnosis not present

## 2016-10-11 DIAGNOSIS — M75101 Unspecified rotator cuff tear or rupture of right shoulder, not specified as traumatic: Secondary | ICD-10-CM | POA: Diagnosis not present

## 2016-10-11 DIAGNOSIS — M25811 Other specified joint disorders, right shoulder: Secondary | ICD-10-CM | POA: Diagnosis not present

## 2016-10-11 DIAGNOSIS — F17290 Nicotine dependence, other tobacco product, uncomplicated: Secondary | ICD-10-CM | POA: Diagnosis not present

## 2016-10-11 DIAGNOSIS — N189 Chronic kidney disease, unspecified: Secondary | ICD-10-CM | POA: Diagnosis present

## 2016-10-11 DIAGNOSIS — Z881 Allergy status to other antibiotic agents status: Secondary | ICD-10-CM

## 2016-10-11 DIAGNOSIS — I129 Hypertensive chronic kidney disease with stage 1 through stage 4 chronic kidney disease, or unspecified chronic kidney disease: Secondary | ICD-10-CM | POA: Diagnosis present

## 2016-10-11 DIAGNOSIS — N182 Chronic kidney disease, stage 2 (mild): Secondary | ICD-10-CM | POA: Diagnosis not present

## 2016-10-11 DIAGNOSIS — N400 Enlarged prostate without lower urinary tract symptoms: Secondary | ICD-10-CM | POA: Diagnosis present

## 2016-10-11 DIAGNOSIS — I10 Essential (primary) hypertension: Secondary | ICD-10-CM | POA: Diagnosis not present

## 2016-10-11 DIAGNOSIS — Z7901 Long term (current) use of anticoagulants: Secondary | ICD-10-CM

## 2016-10-11 DIAGNOSIS — M25511 Pain in right shoulder: Secondary | ICD-10-CM | POA: Diagnosis not present

## 2016-10-11 DIAGNOSIS — Z880 Allergy status to penicillin: Secondary | ICD-10-CM | POA: Diagnosis not present

## 2016-10-11 DIAGNOSIS — Z471 Aftercare following joint replacement surgery: Secondary | ICD-10-CM | POA: Diagnosis not present

## 2016-10-11 DIAGNOSIS — Z87442 Personal history of urinary calculi: Secondary | ICD-10-CM

## 2016-10-11 DIAGNOSIS — E785 Hyperlipidemia, unspecified: Secondary | ICD-10-CM | POA: Diagnosis not present

## 2016-10-11 DIAGNOSIS — K219 Gastro-esophageal reflux disease without esophagitis: Secondary | ICD-10-CM | POA: Diagnosis not present

## 2016-10-11 DIAGNOSIS — G4730 Sleep apnea, unspecified: Secondary | ICD-10-CM | POA: Diagnosis not present

## 2016-10-11 DIAGNOSIS — Z86010 Personal history of colonic polyps: Secondary | ICD-10-CM | POA: Diagnosis not present

## 2016-10-11 DIAGNOSIS — M19012 Primary osteoarthritis, left shoulder: Secondary | ICD-10-CM | POA: Diagnosis present

## 2016-10-11 DIAGNOSIS — Z79899 Other long term (current) drug therapy: Secondary | ICD-10-CM | POA: Diagnosis not present

## 2016-10-11 DIAGNOSIS — M19011 Primary osteoarthritis, right shoulder: Secondary | ICD-10-CM | POA: Diagnosis not present

## 2016-10-11 DIAGNOSIS — Z86711 Personal history of pulmonary embolism: Secondary | ICD-10-CM

## 2016-10-11 DIAGNOSIS — Z85820 Personal history of malignant melanoma of skin: Secondary | ICD-10-CM | POA: Diagnosis not present

## 2016-10-11 DIAGNOSIS — Z96611 Presence of right artificial shoulder joint: Secondary | ICD-10-CM | POA: Diagnosis not present

## 2016-10-11 HISTORY — PX: REVERSE SHOULDER ARTHROPLASTY: SHX5054

## 2016-10-11 HISTORY — PX: STERIOD INJECTION: SHX5046

## 2016-10-11 LAB — BASIC METABOLIC PANEL
Anion gap: 8 (ref 5–15)
BUN: 19 mg/dL (ref 6–20)
CO2: 27 mmol/L (ref 22–32)
Calcium: 9.7 mg/dL (ref 8.9–10.3)
Chloride: 104 mmol/L (ref 101–111)
Creatinine, Ser: 1.38 mg/dL — ABNORMAL HIGH (ref 0.61–1.24)
GFR calc non Af Amer: 49 mL/min — ABNORMAL LOW (ref >60)
GFR, EST AFRICAN AMERICAN: 57 mL/min — AB (ref >60)
Glucose, Bld: 99 mg/dL (ref 65–99)
POTASSIUM: 4 mmol/L (ref 3.5–5.1)
SODIUM: 139 mmol/L (ref 135–145)

## 2016-10-11 LAB — PROTIME-INR
INR: 1.1
Prothrombin Time: 14.2 seconds (ref 11.4–15.2)

## 2016-10-11 SURGERY — ARTHROPLASTY, SHOULDER, TOTAL, REVERSE
Anesthesia: Regional | Site: Shoulder | Laterality: Right

## 2016-10-11 MED ORDER — OXYCODONE-ACETAMINOPHEN 5-325 MG PO TABS: 1.0000 | ORAL_TABLET | ORAL | 0 refills | Status: DC | PRN

## 2016-10-11 MED ORDER — ALBUMIN HUMAN 5 % IV SOLN
12.5000 g | Freq: Once | INTRAVENOUS | Status: AC
  Administered 2016-10-11: 12.5 g via INTRAVENOUS

## 2016-10-11 MED ORDER — FENTANYL CITRATE (PF) 250 MCG/5ML IJ SOLN
INTRAMUSCULAR | Status: AC
  Filled 2016-10-11: qty 5

## 2016-10-11 MED ORDER — ONDANSETRON HCL 4 MG/2ML IJ SOLN
INTRAMUSCULAR | Status: AC
  Filled 2016-10-11: qty 2

## 2016-10-11 MED ORDER — SODIUM CHLORIDE 0.9 % IV SOLN: INTRAVENOUS | Status: DC

## 2016-10-11 MED ORDER — OXYCODONE HCL 5 MG PO TABS
5.0000 mg | ORAL_TABLET | ORAL | Status: DC | PRN
  Administered 2016-10-11 – 2016-10-12 (×3): 10 mg via ORAL
  Filled 2016-10-11 (×3): qty 2

## 2016-10-11 MED ORDER — LOSARTAN POTASSIUM-HCTZ 100-12.5 MG PO TABS: 1.0000 | ORAL_TABLET | Freq: Every day | ORAL | Status: DC

## 2016-10-11 MED ORDER — FENTANYL CITRATE (PF) 100 MCG/2ML IJ SOLN: 25.0000 ug | INTRAMUSCULAR | Status: DC | PRN

## 2016-10-11 MED ORDER — TRIAMCINOLONE ACETONIDE 40 MG/ML IJ SUSP
INTRAMUSCULAR | Status: AC
  Filled 2016-10-11: qty 5

## 2016-10-11 MED ORDER — DOCUSATE SODIUM 100 MG PO CAPS
100.0000 mg | ORAL_CAPSULE | Freq: Two times a day (BID) | ORAL | Status: DC
  Administered 2016-10-11: 100 mg via ORAL
  Filled 2016-10-11 (×2): qty 1

## 2016-10-11 MED ORDER — ACETAMINOPHEN 325 MG PO TABS
650.0000 mg | ORAL_TABLET | Freq: Four times a day (QID) | ORAL | Status: DC | PRN
  Administered 2016-10-11 – 2016-10-12 (×2): 650 mg via ORAL
  Filled 2016-10-11 (×2): qty 2

## 2016-10-11 MED ORDER — CLINDAMYCIN PHOSPHATE 900 MG/50ML IV SOLN
INTRAVENOUS | Status: AC
  Filled 2016-10-11: qty 50

## 2016-10-11 MED ORDER — BISACODYL 10 MG RE SUPP: 10.0000 mg | Freq: Every day | RECTAL | Status: DC | PRN

## 2016-10-11 MED ORDER — MOMETASONE FURO-FORMOTEROL FUM 100-5 MCG/ACT IN AERO
2.0000 | INHALATION_SPRAY | Freq: Two times a day (BID) | RESPIRATORY_TRACT | Status: DC
  Filled 2016-10-11: qty 8.8

## 2016-10-11 MED ORDER — MENTHOL 3 MG MT LOZG: 1.0000 | LOZENGE | OROMUCOSAL | Status: DC | PRN

## 2016-10-11 MED ORDER — TADALAFIL 5 MG PO TABS: 5.0000 mg | ORAL_TABLET | Freq: Every day | ORAL | Status: DC

## 2016-10-11 MED ORDER — APIXABAN 5 MG PO TABS
5.0000 mg | ORAL_TABLET | Freq: Two times a day (BID) | ORAL | Status: DC
  Administered 2016-10-11: 5 mg via ORAL
  Filled 2016-10-11 (×2): qty 1

## 2016-10-11 MED ORDER — SUCCINYLCHOLINE CHLORIDE 200 MG/10ML IV SOSY
PREFILLED_SYRINGE | INTRAVENOUS | Status: DC | PRN
  Administered 2016-10-11: 100 mg via INTRAVENOUS

## 2016-10-11 MED ORDER — PHENYLEPHRINE HCL 10 MG/ML IJ SOLN
INTRAVENOUS | Status: DC | PRN
  Administered 2016-10-11: 40 ug/min via INTRAVENOUS

## 2016-10-11 MED ORDER — ROSUVASTATIN CALCIUM 5 MG PO TABS
10.0000 mg | ORAL_TABLET | Freq: Every day | ORAL | Status: DC
  Administered 2016-10-11: 10 mg via ORAL
  Filled 2016-10-11: qty 2

## 2016-10-11 MED ORDER — METOCLOPRAMIDE HCL 5 MG PO TABS: 5.0000 mg | ORAL_TABLET | Freq: Three times a day (TID) | ORAL | Status: DC | PRN

## 2016-10-11 MED ORDER — ROCURONIUM BROMIDE 10 MG/ML (PF) SYRINGE
PREFILLED_SYRINGE | INTRAVENOUS | Status: AC
  Filled 2016-10-11: qty 5

## 2016-10-11 MED ORDER — METOCLOPRAMIDE HCL 5 MG/ML IJ SOLN: 5.0000 mg | Freq: Three times a day (TID) | INTRAMUSCULAR | Status: DC | PRN

## 2016-10-11 MED ORDER — TRIAMCINOLONE ACETONIDE 40 MG/ML IJ SUSP
INTRAMUSCULAR | Status: DC | PRN
  Administered 2016-10-11: 40 mg via INTRAMUSCULAR

## 2016-10-11 MED ORDER — ACETAMINOPHEN 650 MG RE SUPP: 650.0000 mg | Freq: Four times a day (QID) | RECTAL | Status: DC | PRN

## 2016-10-11 MED ORDER — LIDOCAINE HCL 1 % IJ SOLN
INTRAMUSCULAR | Status: AC
  Filled 2016-10-11: qty 20

## 2016-10-11 MED ORDER — SUCCINYLCHOLINE CHLORIDE 200 MG/10ML IV SOSY
PREFILLED_SYRINGE | INTRAVENOUS | Status: AC
  Filled 2016-10-11: qty 10

## 2016-10-11 MED ORDER — EPHEDRINE SULFATE-NACL 50-0.9 MG/10ML-% IV SOSY
PREFILLED_SYRINGE | INTRAVENOUS | Status: DC | PRN
  Administered 2016-10-11: 10 mg via INTRAVENOUS

## 2016-10-11 MED ORDER — PROPOFOL 10 MG/ML IV BOLUS
INTRAVENOUS | Status: AC
  Filled 2016-10-11: qty 20

## 2016-10-11 MED ORDER — METHOCARBAMOL 1000 MG/10ML IJ SOLN
500.0000 mg | Freq: Four times a day (QID) | INTRAVENOUS | Status: DC | PRN
  Filled 2016-10-11: qty 5

## 2016-10-11 MED ORDER — PANTOPRAZOLE SODIUM 40 MG PO TBEC: 80.0000 mg | DELAYED_RELEASE_TABLET | Freq: Every day | ORAL | Status: DC

## 2016-10-11 MED ORDER — EPINEPHRINE PF 1 MG/ML IJ SOLN
INTRAMUSCULAR | Status: AC
  Filled 2016-10-11: qty 1

## 2016-10-11 MED ORDER — CHLORHEXIDINE GLUCONATE 4 % EX LIQD: 60.0000 mL | Freq: Once | CUTANEOUS | Status: DC

## 2016-10-11 MED ORDER — ONDANSETRON HCL 4 MG PO TABS: 4.0000 mg | ORAL_TABLET | Freq: Four times a day (QID) | ORAL | Status: DC | PRN

## 2016-10-11 MED ORDER — FLUTICASONE PROPIONATE 50 MCG/ACT NA SUSP
2.0000 | Freq: Every day | NASAL | Status: DC | PRN
Start: 2016-10-11 — End: 2016-10-12
  Filled 2016-10-11: qty 16

## 2016-10-11 MED ORDER — MIDAZOLAM HCL 2 MG/2ML IJ SOLN
INTRAMUSCULAR | Status: AC
  Filled 2016-10-11: qty 2

## 2016-10-11 MED ORDER — 0.9 % SODIUM CHLORIDE (POUR BTL) OPTIME
TOPICAL | Status: DC | PRN
  Administered 2016-10-11: 1000 mL

## 2016-10-11 MED ORDER — PHENYLEPHRINE 40 MCG/ML (10ML) SYRINGE FOR IV PUSH (FOR BLOOD PRESSURE SUPPORT)
PREFILLED_SYRINGE | INTRAVENOUS | Status: DC | PRN
  Administered 2016-10-11: 160 ug via INTRAVENOUS
  Administered 2016-10-11: 40 ug via INTRAVENOUS
  Administered 2016-10-11: 80 ug via INTRAVENOUS
  Administered 2016-10-11: 120 ug via INTRAVENOUS

## 2016-10-11 MED ORDER — METHOCARBAMOL 500 MG PO TABS: 500.0000 mg | ORAL_TABLET | Freq: Three times a day (TID) | ORAL | 1 refills | Status: DC | PRN

## 2016-10-11 MED ORDER — B COMPLEX-C PO TABS: 1.0000 | ORAL_TABLET | Freq: Every day | ORAL | Status: DC

## 2016-10-11 MED ORDER — ACETAMINOPHEN ER 650 MG PO TBCR: 650.0000 mg | EXTENDED_RELEASE_TABLET | Freq: Three times a day (TID) | ORAL | Status: DC | PRN

## 2016-10-11 MED ORDER — MIDAZOLAM HCL 5 MG/5ML IJ SOLN
INTRAMUSCULAR | Status: DC | PRN
  Administered 2016-10-11 (×2): 1 mg via INTRAVENOUS

## 2016-10-11 MED ORDER — ONDANSETRON HCL 4 MG/2ML IJ SOLN
INTRAMUSCULAR | Status: DC | PRN
  Administered 2016-10-11: 4 mg via INTRAVENOUS

## 2016-10-11 MED ORDER — POLYETHYLENE GLYCOL 3350 17 G PO PACK: 17.0000 g | PACK | Freq: Every day | ORAL | Status: DC | PRN

## 2016-10-11 MED ORDER — METHOCARBAMOL 500 MG PO TABS
500.0000 mg | ORAL_TABLET | Freq: Four times a day (QID) | ORAL | Status: DC | PRN
  Administered 2016-10-11 – 2016-10-12 (×2): 500 mg via ORAL
  Filled 2016-10-11 (×2): qty 1

## 2016-10-11 MED ORDER — FENTANYL CITRATE (PF) 100 MCG/2ML IJ SOLN
INTRAMUSCULAR | Status: DC | PRN
  Administered 2016-10-11: 100 ug via INTRAVENOUS
  Administered 2016-10-11: 50 ug via INTRAVENOUS

## 2016-10-11 MED ORDER — PHENYLEPHRINE 40 MCG/ML (10ML) SYRINGE FOR IV PUSH (FOR BLOOD PRESSURE SUPPORT)
PREFILLED_SYRINGE | INTRAVENOUS | Status: AC
  Filled 2016-10-11: qty 10

## 2016-10-11 MED ORDER — PROPOFOL 10 MG/ML IV BOLUS
INTRAVENOUS | Status: DC | PRN
  Administered 2016-10-11: 50 mg via INTRAVENOUS
  Administered 2016-10-11: 130 mg via INTRAVENOUS
  Administered 2016-10-11: 70 mg via INTRAVENOUS

## 2016-10-11 MED ORDER — CLINDAMYCIN PHOSPHATE 900 MG/50ML IV SOLN
900.0000 mg | INTRAVENOUS | Status: AC
  Administered 2016-10-11: 900 mg via INTRAVENOUS

## 2016-10-11 MED ORDER — PROBENECID 500 MG PO TABS
500.0000 mg | ORAL_TABLET | Freq: Every day | ORAL | Status: DC
  Filled 2016-10-11: qty 1

## 2016-10-11 MED ORDER — BUPIVACAINE-EPINEPHRINE (PF) 0.5% -1:200000 IJ SOLN
INTRAMUSCULAR | Status: DC | PRN
  Administered 2016-10-11: 30 mL via PERINEURAL

## 2016-10-11 MED ORDER — BUPIVACAINE HCL (PF) 0.25 % IJ SOLN
INTRAMUSCULAR | Status: AC
  Filled 2016-10-11: qty 30

## 2016-10-11 MED ORDER — FENOFIBRATE 160 MG PO TABS
160.0000 mg | ORAL_TABLET | Freq: Every day | ORAL | Status: DC
  Administered 2016-10-11: 160 mg via ORAL
  Filled 2016-10-11: qty 1

## 2016-10-11 MED ORDER — LIDOCAINE HCL 1 % IJ SOLN
INTRAMUSCULAR | Status: DC | PRN
  Administered 2016-10-11: 5 mL

## 2016-10-11 MED ORDER — HYDROMORPHONE HCL 1 MG/ML IJ SOLN: 1.0000 mg | INTRAMUSCULAR | Status: DC | PRN

## 2016-10-11 MED ORDER — PHENOL 1.4 % MT LIQD: 1.0000 | OROMUCOSAL | Status: DC | PRN

## 2016-10-11 MED ORDER — CHOLECALCIFEROL 250 MCG (10000 UT) PO CAPS: 10000.0000 [IU] | ORAL_CAPSULE | Freq: Every day | ORAL | Status: DC

## 2016-10-11 MED ORDER — LIDOCAINE 2% (20 MG/ML) 5 ML SYRINGE
INTRAMUSCULAR | Status: AC
  Filled 2016-10-11: qty 5

## 2016-10-11 MED ORDER — LIDOCAINE HCL (CARDIAC) 20 MG/ML IV SOLN
INTRAVENOUS | Status: DC | PRN
  Administered 2016-10-11: 40 mg via INTRAVENOUS

## 2016-10-11 MED ORDER — EPHEDRINE 5 MG/ML INJ
INTRAVENOUS | Status: AC
  Filled 2016-10-11: qty 10

## 2016-10-11 MED ORDER — BUPIVACAINE-EPINEPHRINE 0.25% -1:200000 IJ SOLN
INTRAMUSCULAR | Status: DC | PRN
  Administered 2016-10-11: 5 mL

## 2016-10-11 MED ORDER — LOSARTAN POTASSIUM 50 MG PO TABS
100.0000 mg | ORAL_TABLET | Freq: Every day | ORAL | Status: DC
  Administered 2016-10-11: 100 mg via ORAL
  Filled 2016-10-11: qty 2

## 2016-10-11 MED ORDER — HYDROCHLOROTHIAZIDE 12.5 MG PO CAPS
12.5000 mg | ORAL_CAPSULE | Freq: Every day | ORAL | Status: DC
  Administered 2016-10-11: 12.5 mg via ORAL
  Filled 2016-10-11: qty 1

## 2016-10-11 MED ORDER — CLINDAMYCIN PHOSPHATE 600 MG/50ML IV SOLN
600.0000 mg | Freq: Four times a day (QID) | INTRAVENOUS | Status: AC
  Administered 2016-10-11 – 2016-10-12 (×3): 600 mg via INTRAVENOUS
  Filled 2016-10-11 (×5): qty 50

## 2016-10-11 MED ORDER — LACTATED RINGERS IV SOLN
INTRAVENOUS | Status: DC | PRN
  Administered 2016-10-11 (×2): via INTRAVENOUS

## 2016-10-11 MED ORDER — ONDANSETRON HCL 4 MG/2ML IJ SOLN: 4.0000 mg | Freq: Four times a day (QID) | INTRAMUSCULAR | Status: DC | PRN

## 2016-10-11 SURGICAL SUPPLY — 72 items
BIT DRILL 170X2.5X (BIT) IMPLANT
BIT DRILL 5/64X5 DISP (BIT) ×2 IMPLANT
BIT DRL 170X2.5X (BIT)
BLADE SAG 18X100X1.27 (BLADE) ×3 IMPLANT
CAPT SHLDR REVTOTAL 1 ×1 IMPLANT
COVER SURGICAL LIGHT HANDLE (MISCELLANEOUS) ×3 IMPLANT
DRAPE IMP U-DRAPE 54X76 (DRAPES) ×6 IMPLANT
DRAPE INCISE IOBAN 66X45 STRL (DRAPES) ×3 IMPLANT
DRAPE ORTHO SPLIT 77X108 STRL (DRAPES) ×6
DRAPE SURG ORHT 6 SPLT 77X108 (DRAPES) ×4 IMPLANT
DRAPE U-SHAPE 47X51 STRL (DRAPES) ×3 IMPLANT
DRILL 2.5 (BIT)
DRSG ADAPTIC 3X8 NADH LF (GAUZE/BANDAGES/DRESSINGS) ×3 IMPLANT
DRSG PAD ABDOMINAL 8X10 ST (GAUZE/BANDAGES/DRESSINGS) ×3 IMPLANT
DURAPREP 26ML APPLICATOR (WOUND CARE) ×3 IMPLANT
ELECT BLADE 4.0 EZ CLEAN MEGAD (MISCELLANEOUS) ×3
ELECT NDL TIP 2.8 STRL (NEEDLE) ×2 IMPLANT
ELECT NEEDLE TIP 2.8 STRL (NEEDLE) ×3 IMPLANT
ELECT REM PT RETURN 9FT ADLT (ELECTROSURGICAL) ×3
ELECTRODE BLDE 4.0 EZ CLN MEGD (MISCELLANEOUS) ×2 IMPLANT
ELECTRODE REM PT RTRN 9FT ADLT (ELECTROSURGICAL) ×2 IMPLANT
GAUZE SPONGE 4X4 12PLY STRL (GAUZE/BANDAGES/DRESSINGS) ×3 IMPLANT
GLOVE BIOGEL PI ORTHO PRO 7.5 (GLOVE) ×1
GLOVE BIOGEL PI ORTHO PRO SZ8 (GLOVE) ×1
GLOVE ORTHO TXT STRL SZ7.5 (GLOVE) ×3 IMPLANT
GLOVE PI ORTHO PRO STRL 7.5 (GLOVE) ×2 IMPLANT
GLOVE PI ORTHO PRO STRL SZ8 (GLOVE) ×2 IMPLANT
GLOVE SURG ORTHO 8.5 STRL (GLOVE) ×3 IMPLANT
GOWN STRL REUS W/ TWL LRG LVL3 (GOWN DISPOSABLE) ×2 IMPLANT
GOWN STRL REUS W/ TWL XL LVL3 (GOWN DISPOSABLE) ×4 IMPLANT
GOWN STRL REUS W/TWL LRG LVL3 (GOWN DISPOSABLE) ×3
GOWN STRL REUS W/TWL XL LVL3 (GOWN DISPOSABLE) ×6
KIT BASIN OR (CUSTOM PROCEDURE TRAY) ×3 IMPLANT
KIT ROOM TURNOVER OR (KITS) ×3 IMPLANT
MANIFOLD NEPTUNE II (INSTRUMENTS) ×3 IMPLANT
NDL 1/2 CIR MAYO (NEEDLE) ×2 IMPLANT
NDL 18GX1X1/2 (RX/OR ONLY) (NEEDLE) IMPLANT
NDL FILTER BLUNT 18X1 1/2 (NEEDLE) IMPLANT
NDL HYPO 25GX1X1/2 BEV (NEEDLE) ×2 IMPLANT
NEEDLE 1/2 CIR MAYO (NEEDLE) ×3 IMPLANT
NEEDLE 18GX1X1/2 (RX/OR ONLY) (NEEDLE) ×3 IMPLANT
NEEDLE FILTER BLUNT 18X 1/2SAF (NEEDLE) ×1
NEEDLE FILTER BLUNT 18X1 1/2 (NEEDLE) ×2 IMPLANT
NEEDLE HYPO 25GX1X1/2 BEV (NEEDLE) ×3 IMPLANT
NS IRRIG 1000ML POUR BTL (IV SOLUTION) ×3 IMPLANT
PACK SHOULDER (CUSTOM PROCEDURE TRAY) ×3 IMPLANT
PAD ARMBOARD 7.5X6 YLW CONV (MISCELLANEOUS) ×6 IMPLANT
PIN GUIDE 1.2 (PIN) IMPLANT
PIN GUIDE GLENOPHERE 1.5MX300M (PIN) IMPLANT
PIN METAGLENE 2.5 (PIN) IMPLANT
SLING ARM FOAM STRAP LRG (SOFTGOODS) ×1 IMPLANT
SLING ARM FOAM STRAP MED (SOFTGOODS) IMPLANT
SPONGE LAP 18X18 X RAY DECT (DISPOSABLE) IMPLANT
SPONGE LAP 4X18 X RAY DECT (DISPOSABLE) ×3 IMPLANT
STRIP CLOSURE SKIN 1/2X4 (GAUZE/BANDAGES/DRESSINGS) ×3 IMPLANT
SUCTION FRAZIER HANDLE 10FR (MISCELLANEOUS) ×1
SUCTION TUBE FRAZIER 10FR DISP (MISCELLANEOUS) ×2 IMPLANT
SUT FIBERWIRE #2 38 T-5 BLUE (SUTURE) ×15
SUT MNCRL AB 4-0 PS2 18 (SUTURE) ×3 IMPLANT
SUT VIC AB 0 CT2 27 (SUTURE) ×3 IMPLANT
SUT VIC AB 2-0 CT1 27 (SUTURE) ×3
SUT VIC AB 2-0 CT1 TAPERPNT 27 (SUTURE) ×2 IMPLANT
SUT VICRYL 0 CT 1 36IN (SUTURE) ×3 IMPLANT
SUTURE FIBERWR #2 38 T-5 BLUE (SUTURE) ×4 IMPLANT
SYR CONTROL 10ML LL (SYRINGE) ×4 IMPLANT
SYR TB 1ML LUER SLIP (SYRINGE) ×1 IMPLANT
TAPE CLOTH SURG 6X10 WHT LF (GAUZE/BANDAGES/DRESSINGS) ×1 IMPLANT
TOWEL OR 17X24 6PK STRL BLUE (TOWEL DISPOSABLE) ×3 IMPLANT
TOWEL OR 17X26 10 PK STRL BLUE (TOWEL DISPOSABLE) ×3 IMPLANT
TOWER CARTRIDGE SMART MIX (DISPOSABLE) IMPLANT
WATER STERILE IRR 1000ML POUR (IV SOLUTION) ×3 IMPLANT
YANKAUER SUCT BULB TIP NO VENT (SUCTIONS) ×3 IMPLANT

## 2016-10-11 NOTE — Transfer of Care (Signed)
Immediate Anesthesia Transfer of Care Note  Patient: [name]  Procedure(s) Performed: Procedure(s): REVERSE RIGHT SHOULDER ARTHROPLASTY (Right) STEROID INJECTION LEFT SHOULDER (Left)  Patient Location: PACU  Anesthesia Type:General  Level of Consciousness: awake, alert  and oriented  Airway & Oxygen Therapy: Patient Spontanous Breathing and Patient connected to nasal cannula oxygen  Post-op Assessment: Report given to RN, Post -op Vital signs reviewed and stable and Patient moving all extremities  Post vital signs: Reviewed and stable  Last Vitals:  Vitals:   10/11/16 0621 10/11/16 0950  BP: 93/65   Pulse: 80   Resp: 20   Temp: 36.8 C 36.8 C    Last Pain:  Vitals:   10/11/16 0621  TempSrc: Oral         Complications: No apparent anesthesia complications

## 2016-10-11 NOTE — Progress Notes (Signed)
Occupational Therapy Evaluation Patient Details Name: [name] MRN: [id number] DOB: 1940/08/22 Today's Date: 10/11/2016    History of Present Illness s/p R reverse TSA. PMH includes B TKA.    Clinical Impression   PTA, pt independent with ADL and mobility. Began education regarding HEP protocol and compensatory techniques for ADL. Written information reviewed.  Pt will have his significant other to help for 1 week post op and then will need to be modified independent with self care. Will follow acutely to complete education regarding ADL and IADL management and HEP per protocol. Educated nsg/pt on proper positioning of RUE.     Follow Up Recommendations  No OT follow up;Supervision - Intermittent    Equipment Recommendations  None recommended by OT    Recommendations for Other Services       Precautions / Restrictions Precautions Precautions: Shoulder Type of Shoulder Precautions: conservative protocol - gentle ADLs ok; no AROM shoulder; AROM elbow wrist/hand Precaution Booklet Issued: Yes (comment) Required Braces or Orthoses: Sling Restrictions Weight Bearing Restrictions: Yes RUE Weight Bearing: Non weight bearing      Mobility Bed Mobility Overal bed mobility: Needs Assistance Bed Mobility: Supine to Sit     Supine to sit: Supervision     General bed mobility comments: vc for correct positioning of RUE  Transfers Overall transfer level: Modified independent                    Balance Overall balance assessment: No apparent balance deficits (not formally assessed)                                         ADL either performed or assessed with clinical judgement   ADL Overall ADL's : Needs assistance/impaired     Grooming: Minimal assistance;Standing   Upper Body Bathing: Minimal assistance;Sitting   Lower Body Bathing: Minimal assistance;Sit to/from stand   Upper Body Dressing : Moderate assistance;Sitting   Lower Body  Dressing: Minimal assistance;Sit to/from stand   Toilet Transfer: Modified Independent;Ambulation;BSC   Toileting- Clothing Manipulation and Hygiene: Minimal assistance       Functional mobility during ADLs: Modified independent General ADL Comments: Began education regarding comepsnatory techniques for ADL adhering to protocol  Recommend pt use his 3 in1 as shower seat initially     Vision         Perception     Praxis      Pertinent Vitals/Pain Pain Assessment: No/denies pain     Hand Dominance Right   Extremity/Trunk Assessment Upper Extremity Assessment Upper Extremity Assessment: RUE deficits/detail RUE Deficits / Details: able to move hand/wrist/block still effective RUE Sensation:  (block effective)   Lower Extremity Assessment Lower Extremity Assessment: Overall WFL for tasks assessed   Cervical / Trunk Assessment Cervical / Trunk Assessment: Normal   Communication Communication Communication: No difficulties   Cognition Arousal/Alertness: Awake/alert Behavior During Therapy: WFL for tasks assessed/performed Overall Cognitive Status: Within Functional Limits for tasks assessed                                     General Comments       Exercises Exercises: Shoulder Shoulder Exercises Elbow Flexion: AAROM;Right;10 reps Elbow Extension: AAROM;Right;10 reps Wrist Flexion: AROM;Right;10 reps Wrist Extension: AROM;Right;10 reps   Shoulder Instructions Shoulder Instructions ROM for elbow,  wrist and digits of operated UE: Minimal assistance Sling wearing schedule (on at all times/off for ADL's): Minimal assistance Proper positioning of operated UE when showering: Minimal assistance Positioning of UE while sleeping: Minimal assistance    Home Living Family/patient expects to be discharged to:: Private residence Living Arrangements: Spouse/significant other Available Help at Discharge: Family;Available PRN/intermittently (availabile  first week) Type of Home: House Home Access: Stairs to enter [employer] of Steps: 2 Entrance Stairs-Rails: Right;Left Home Layout: Able to live on main level with bedroom/bathroom     Bathroom Shower/Tub: Occupational psychologist: Standard Bathroom Accessibility: Yes How Accessible: Accessible via walker Home Equipment: [place] - 2 wheels;Bedside commode          Prior Functioning/Environment Level of Independence: Independent                 OT Problem List: Decreased strength;Decreased range of motion;Decreased coordination;Decreased knowledge of use of DME or AE;Decreased knowledge of precautions;Obesity;Impaired UE functional use;Pain      OT Treatment/Interventions: Self-care/ADL training;Therapeutic exercise;DME and/or AE instruction;Therapeutic activities;Patient/family education    OT Goals(Current goals can be found in the care plan section) Acute Rehab OT Goals Patient Stated Goal: to drive OT Goal Formulation: With patient Time For Goal Achievement: 10/18/16 Potential to Achieve Goals: Good ADL Goals Pt Will Perform Upper Body Bathing: with supervision;with caregiver independent in assisting;sitting Pt Will Perform Lower Body Bathing: with supervision;with caregiver independent in assisting;sit to/from stand Pt Will Perform Upper Body Dressing: with supervision;with caregiver independent in assisting;sitting Pt Will Perform Toileting - Clothing Manipulation and hygiene: with modified independence;sit to/from stand Pt/caregiver will Perform Home Exercise Program: Increased ROM;Right Upper extremity;With written HEP provided (per protocol)  OT Frequency: Min 3X/week   Barriers to D/C:            Co-evaluation              AM-PAC PT "6 Clicks" Daily Activity     Outcome Measure Help from another person eating meals?: None Help from another person taking care of personal grooming?: A Little Help from another person toileting,  which includes using toliet, bedpan, or urinal?: A Little Help from another person bathing (including washing, rinsing, drying)?: A Little Help from another person to put on and taking off regular upper body clothing?: A Lot Help from another person to put on and taking off regular lower body clothing?: A Little 6 Click Score: 18   End of Session Equipment Utilized During Treatment:  (sling) Nurse Communication: Mobility status;Other (comment) (positioning of RUE)  Activity Tolerance: Patient tolerated treatment well Patient left: in bed;with call bell/phone within reach;with SCD's reapplied  OT Visit Diagnosis: Muscle weakness (generalized) (M62.81);Pain Pain - Right/Left: Right Pain - part of body: Shoulder                Time: 5456-2563 OT Time Calculation (min): 16 min Charges:  OT General Charges $OT Visit: 1 Procedure OT Evaluation $OT Eval Low Complexity: 1 Procedure G-Codes:     Tangie Stay, OT/L  [phone] 10/11/2016  Drewey Begue,HILLARY 10/11/2016, 4:51 PM

## 2016-10-11 NOTE — Progress Notes (Signed)
Pt admitted to the unit from pacu; pt A&O x4; VSS: IV intact and transfusing; pt ambulated to the BR to void x1; RUE remains in sling; Abd gauze dsg to RUE incision has some scant stain but clean, dry and intact. Pt report some small sensation to RUE, able to wiggle fingers and grip but still numb and tingling from block received during surgery. LUE neuro wnl. Ice applied to RUE. Pt oriented to the unit and room; fall/safety precaution and prevention education completed. Pt sitting up in chair with call light within reach and will closely monitor pt. Delia Heady RN

## 2016-10-11 NOTE — Anesthesia Procedure Notes (Signed)
Anesthesia Regional Block: Interscalene brachial plexus block   Pre-Anesthetic Checklist: ,, timeout performed, Correct Patient, Correct Site, Correct Laterality, Correct Procedure, Correct Position, site marked, Risks and benefits discussed, pre-op evaluation,  At surgeon's request and post-op pain management  Laterality: Right  Prep: Maximum Sterile Barrier Precautions used, chloraprep       Needles:  Injection technique: Single-shot  Needle Type: Echogenic Stimulator Needle     Needle Length: 5cm  Needle Gauge: 22     Additional Needles:   Procedures: ultrasound guided, nerve stimulator,,,,,,   Nerve Stimulator or Paresthesia:  Response: Biceps response,   Additional Responses:   Narrative:  Start time: 10/11/2016 7:00 AM End time: 10/11/2016 7:10 AM Injection made incrementally with aspirations every 5 mL. Anesthesiologist: Roderic Palau  Additional Notes: 2% Lidocaine skin wheel.

## 2016-10-11 NOTE — Anesthesia Postprocedure Evaluation (Signed)
Anesthesia Post Note  Patient: [name]  Procedure(s) Performed: Procedure(s) (LRB): REVERSE RIGHT SHOULDER ARTHROPLASTY (Right) STEROID INJECTION LEFT SHOULDER (Left)  Patient location during evaluation: PACU Anesthesia Type: Regional and General Level of consciousness: awake and alert Pain management: pain level controlled Vital Signs Assessment: post-procedure vital signs reviewed and stable Respiratory status: spontaneous breathing, nonlabored ventilation and respiratory function stable Cardiovascular status: blood pressure returned to baseline and stable Postop Assessment: no signs of nausea or vomiting Anesthetic complications: no       Last Vitals:  Vitals:   10/11/16 1138 10/11/16 1142  BP: 106/68   Pulse: 83   Resp: (!) 23   Temp:  36.1 C    Last Pain:  Vitals:   10/11/16 1045  TempSrc:   PainSc: 0-No pain                 Zunairah Devers,W. EDMOND

## 2016-10-11 NOTE — Brief Op Note (Signed)
10/11/2016  9:53 AM  PATIENT:  [name]  76 y.o. male  PRE-OPERATIVE DIAGNOSIS:  Right shoulder primary osteoarthritis, rotator cuff insufficency, Left shoulder OA, primary  POST-OPERATIVE DIAGNOSIS:  Right shoulder primary osteoarthritis, rotator cuff insufficency, Left shoulder OA primary  PROCEDURE:  Procedure(s): REVERSE RIGHT SHOULDER ARTHROPLASTY (Right) STEROID INJECTION LEFT SHOULDER (Left)  SURGEON:  Surgeon(s) and Role:    Netta Cedars, MD - Primary  PHYSICIAN ASSISTANT:   ASSISTANTS: [name], PA-C   ANESTHESIA:   regional and general  EBL:  Total I/O In: 1000 [I.V.:1000] Out: 100 [Blood:100]  BLOOD ADMINISTERED:none  DRAINS: none   LOCAL MEDICATIONS USED:  MARCAINE     SPECIMEN:  No Specimen  DISPOSITION OF SPECIMEN:  N/A  COUNTS:  YES  TOURNIQUET:  * No tourniquets in log *  DICTATION: .Other Dictation: Dictation Number [id number]  PLAN OF CARE: Admit to inpatient   PATIENT DISPOSITION:  PACU stabl   Delay start of Pharmacological VTE agent (>24hrs) due to surgical blood loss or risk of bleeding: not applicable

## 2016-10-11 NOTE — Anesthesia Procedure Notes (Addendum)
Procedure Name: Intubation Date/Time: 10/11/2016 7:44 AM Performed by: Orlie Dakin Pre-anesthesia Checklist: Patient identified, Emergency Drugs available, Suction available, Patient being monitored and Timeout performed Patient Re-evaluated:Patient Re-evaluated prior to inductionOxygen Delivery Method: Circle system utilized Preoxygenation: Pre-oxygenation with 100% oxygen Intubation Type: IV induction Ventilation: Mask ventilation without difficulty Laryngoscope Size: Glidescope and 4 Grade View: Grade I Tube type: Oral Tube size: 7.5 mm Number of attempts: 1 Airway Equipment and Method: Video-laryngoscopy Placement Confirmation: ETT inserted through vocal cords under direct vision,  positive ETCO2 and breath sounds checked- equal and bilateral Secured at: 23 cm Tube secured with: Tape Dental Injury: Teeth and Oropharynx as per pre-operative assessment  Comments: 4x4s bite block used.

## 2016-10-11 NOTE — Op Note (Signed)
NAMEBADEN, [name] NO.:  [id number]  MEDICAL RECORD NO.:  [id number]  LOCATION:                                 FACILITY:  PHYSICIAN:  [name]. [name], M.D.      DATE OF BIRTH:  DATE OF PROCEDURE:  10/11/2016 DATE OF DISCHARGE:                              OPERATIVE REPORT   PREOPERATIVE DIAGNOSES: 1. 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 2. Left shoulder osteoarthritis, primary.  POSTOPERATIVE DIAGNOSES: 1. 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 2. Left shoulder osteoarthritis, primary.  PROCEDURE PERFORMED: 1. Right shoulder reverse total shoulder arthroplasty using DePuy     Delta Xtend prosthesis with subscapularis repair. 2. Left shoulder intra-articular and subacromial corticosteroid     injection with 1 mL of Kenalog 40 mg and 3 mL of lidocaine, 3 mL of     Marcaine.  ATTENDING SURGEON:  [name]. [name], MD.  ASSISTANT:  [name], [facility] who scrubbed the entire procedure and necessary for satisfactory completion of surgery.  ANESTHESIA:  General anesthesia was used plus interscalene block on the right side.  No block performed for the injection on the left.  INSTRUMENT COUNTS:  Correct.  COMPLICATIONS:  No complications.  Perioperative antibiotics were given.  INDICATIONS:  The patient is a 76 year old male with progressively worse right shoulder pain and dysfunction secondary to combination of factors including end-stage primary osteoarthritis with bone-on-bone as well as rotator cuff insufficiency and lap block, loss of fixed fulcrum mechanics.  The patient also has significant left shoulder pain secondary to osteoarthritis.  He presents today desiring a right shoulder reverse arthroplasty and left shoulder intra- articular/subacromial cortisone injection.  Risks and benefits were discussed in detail with the patient.  Informed consent  obtained.  DESCRIPTION OF PROCEDURE:  After an adequate level of anesthesia achieved, the patient was positioned in the modified beach-chair position.  Right shoulder correctly identified, sterilely prepped and draped in usual manner.  Time-out was called.  We initiated right shoulder surgery.  After sterile prep and drape and time-out, using a standard deltopectoral incision started coracoid process extending down to the anterior humerus.  Dissection down through subcutaneous tissues using Bovie.  Cephalic vein identified and taken laterally to the deltoid, pectoralis taken medially.  Conjoint tendon identified, retracted medially.  We tenodesed the biceps in situ with 0 Vicryl figure-of-eight suture x2 into the pectoralis tendon.  We had nice tenodesis.  We released subscapularis off subperiosteally off the lesser tuberosity and placed #2 FiberWire suture in a modified Mason-Allen suture technique in the free end of the tendon and retracted that.  We then released the inferior capsule off the inferior humeral neck progressively externally rotating.  Large osteophytes were noted and bone on bone wear was noted eburnated bone.  We then released the biceps at the joint line.  We then released the remnant of the supraspinatus infraspinatus tendons which were in poor shape and resected that extending the shoulder.  We delivered the humeral head out of the wound, we preserved teres minor in the back.  We entered the shoulder with a  6 mm reamer reaming up to a size 12.  We then placed a 12 mm intramedullary resection guide and resected the head at the appropriate level at 10 degrees of retroversion.  We removed excess osteophytes with a large rongeur.  We then placed our guide for our metaphyseal reaming for the epi-2 right metaphyseal component.  Once we had the guide inserted, we used the reamer and reamed down to the appropriate level and the cancellous bone making the room for the  metaphyseal portion of this reverse shoulder replacement.  Next, we removed the reamer guide, irrigated thoroughly, and then impacted the trial in place, a 12 body epi-2 right metaphysis.  Once that was in place, we subluxed the humerus posteriorly, did a 360 degree capsule labral removal.  We had good exposure of the glenoid at 360.  We were protecting axillary nerve the entire portion of procedure.  Once we had good visualization, we found our center point centered low on the glenoid.  We drilled our guide pin for the base plate and then milled using the reamer for the DePuy Delta extend base plate.  Once we had that milling completed and used peripheral hand reamer, we drilled the central peg hole.  We then impacted the metaglene into position.  We placed a 42 lock screw inferiorly, 36 locked screw at the base of the coracoid, and then 18 nonlocked anteriorly and posteriorly with excellent base plate fixation security in support.  We then placed a 42 standard Glenosphere into position and screwed that into place, impacting and screwing again. Once we had that firmly secured, I checked and there were no impinging lesions inferiorly.  We removed multiple loose bodies from the various fossa, subscap fossa and supraspinatus fossa.  We irrigated thoroughly. We then reduced the shoulder with a 42, +3 poly trial that gave [place] decent stability.  We felt like we could probably get a +6 in place. Conjoined was tight.  No gapping with external rotation or inferior pole, but again a little bit loose going on and we thought we could get a little bit tighter poly and we removed the trial components from the humeral side, placed drill holes, #2 FiberWire suture in lesser tuberosity for repair of the subscapularis.  We then used impaction grafting technique after irrigation of the humeral canal with available bone from the humeral head and impacted the HA coated press-fit stem into place, size 12 body,  epi-2 right metaphysis set on the 0 setting and placed in 10 degrees of retroversion.  Once that was impacted in place, we selected the real 42+ 6 poly, impacted that in place, reduced the shoulder with a nice little pop.  We ranged the shoulder again.  No gapping with external rotation, inferior pole and conjoined was nice and tight.  The axillary nerve was not under undue tension.  We then went ahead and checked to see if we could repair the subscapularis, which we could.  We had drill holes and sutures already in place.  We then repaired anatomically the subscapularis to the lesser tuberosity and even lateralizing that just a little bit that gave [place] excellent coverage anteriorly, good stability and we could externally rotate to 45 degrees even with the repair done, and it did not limit our motion.  Final lavage and inspection of the joint.  We then closed deltopectoral interval with 0 Vicryl suture followed by 2-0 Vicryl for subcutaneous closure and 4-0 Monocryl for skin.  Sterile dressing was applied.  We then injected the left shoulder with sterile prep through a posterior lateral approach, both subacromially and intra-articularly with 1 mL of Kenalog 40 mg, 3 mL lidocaine, Marcaine.  Once that was completed, we concluded surgery and the patient was transported to the recovery room in stable condition.     [name]. [name], M.D.   ______________________________ [name]. [name], M.D.    SRN/MEDQ  D:  10/11/2016  T:  10/11/2016  Job:  [id number]

## 2016-10-11 NOTE — Interval H&P Note (Signed)
History and Physical Interval Note:  10/11/2016 7:30 AM  [name]  has presented today for surgery, with the diagnosis of Right shoulder osteoarthritis, rotator cuff insufficency  The various methods of treatment have been discussed with the patient and family. After consideration of risks, benefits and other options for treatment, the patient has consented to  Procedure(s): REVERSE RIGHT SHOULDER ARTHROPLASTY (Right) as a surgical intervention .  The patient's history has been reviewed, patient examined, no change in status, stable for surgery.  I have reviewed the patient's chart and labs.  Questions were answered to the patient's satisfaction.     Emmauel Hallums,STEVEN R

## 2016-10-12 LAB — BASIC METABOLIC PANEL
ANION GAP: 9 (ref 5–15)
BUN: 15 mg/dL (ref 6–20)
CO2: 25 mmol/L (ref 22–32)
Calcium: 9.1 mg/dL (ref 8.9–10.3)
Chloride: 103 mmol/L (ref 101–111)
Creatinine, Ser: 1.25 mg/dL — ABNORMAL HIGH (ref 0.61–1.24)
GFR, EST NON AFRICAN AMERICAN: 55 mL/min — AB (ref >60)
Glucose, Bld: 127 mg/dL — ABNORMAL HIGH (ref 65–99)
POTASSIUM: 3.9 mmol/L (ref 3.5–5.1)
Sodium: 137 mmol/L (ref 135–145)

## 2016-10-12 LAB — HEMOGLOBIN AND HEMATOCRIT, BLOOD
HEMATOCRIT: 40.6 % (ref 39.0–52.0)
Hemoglobin: 12.7 g/dL — ABNORMAL LOW (ref 13.0–17.0)

## 2016-10-12 NOTE — Progress Notes (Signed)
Assumed care from [facility], RN. Will continue to monitor

## 2016-10-12 NOTE — Discharge Summary (Signed)
Physician Discharge Summary   Patient ID: SOCORRO EBRON MRN: [id number] DOB/AGE: 08-05-1940 76 y.o.  Admit date: 10/11/2016 Discharge date: 10/12/2016  Admission Diagnoses:  Active Problems:   S/P shoulder replacement, right   Discharge Diagnoses:  Same   Surgeries: Procedure(s): REVERSE RIGHT SHOULDER ARTHROPLASTY STEROID INJECTION LEFT SHOULDER on 10/11/2016   Consultants: OT  Discharged Condition: Stable  Hospital Course: [name] is an 76 y.o. male who was admitted 10/11/2016 with a chief complaint of 1) right shoulder pain and 2) left shoulder pain, and found to have a diagnosis of 1)right shoulder primary OA, rotator cuff insufficiency and 2) left shoulder OA  They were brought to the operating room on 10/11/2016 and underwent the above named procedures.    The patient had an uncomplicated hospital course and was stable for discharge.  Recent vital signs:  Vitals:   10/12/16 0030 10/12/16 0430  BP: 124/76 138/90  Pulse: 89 92  Resp: 18 20  Temp: 98.1 F (36.7 C) 98 F (36.7 C)    Recent laboratory studies:  Results for orders placed or performed during the hospital encounter of 10/11/16  Protime-INR  Result Value Ref Range   Prothrombin Time 14.2 11.4 - 15.2 seconds   INR 2.50   Basic metabolic panel  Result Value Ref Range   Sodium 139 135 - 145 mmol/L   Potassium 4.0 3.5 - 5.1 mmol/L   Chloride 104 101 - 111 mmol/L   CO2 27 22 - 32 mmol/L   Glucose, Bld 99 65 - 99 mg/dL   BUN 19 6 - 20 mg/dL   Creatinine, Ser 1.38 (H) 0.61 - 1.24 mg/dL   Calcium 9.7 8.9 - 10.3 mg/dL   GFR calc non Af Amer 49 (L) >60 mL/min   GFR calc Af Amer 57 (L) >60 mL/min   Anion gap 8 5 - 15  Hemoglobin and hematocrit, blood  Result Value Ref Range   Hemoglobin 12.7 (L) 13.0 - 17.0 g/dL   HCT 40.6 39.0 - 53.9 %  Basic metabolic panel  Result Value Ref Range   Sodium 137 135 - 145 mmol/L   Potassium 3.9 3.5 - 5.1 mmol/L   Chloride 103 101 - 111 mmol/L   CO2 25 22 - 32  mmol/L   Glucose, Bld 127 (H) 65 - 99 mg/dL   BUN 15 6 - 20 mg/dL   Creatinine, Ser 1.25 (H) 0.61 - 1.24 mg/dL   Calcium 9.1 8.9 - 10.3 mg/dL   GFR calc non Af Amer 55 (L) >60 mL/min   GFR calc Af Amer >60 >60 mL/min   Anion gap 9 5 - 15    Discharge Medications:   Allergies as of 10/12/2016      Reactions   Cefaclor Other (See Comments)    skin split   Penicillins Rash   Has patient had a PCN reaction causing immediate rash, facial/tongue/throat swelling, SOB or lightheadedness with hypotension:unsure Has patient had a PCN reaction causing severe rash involving mucus membranes or skin necrosis:unsure Has patient had a PCN reaction that required hospitalization:No Has patient had a PCN reaction occurring within the last 10 years:No--childhood allergy If all of the above answers are "NO", then may proceed with Cephalosporin use.      Medication List    STOP taking these medications   clindamycin 150 MG capsule Commonly known as:  CLEOCIN     TAKE these medications   acetaminophen 650 MG CR tablet Commonly known as:  TYLENOL Take  650 mg by mouth every 8 (eight) hours as needed for pain.   B-complex with vitamin C tablet Take 1 tablet by mouth daily.   [employer] VITAMIN D3 10000 units Caps Generic drug:  Cholecalciferol Take 10,000 Units by mouth daily.   ELIQUIS 5 MG Tabs tablet Generic drug:  apixaban Take 10 mg by mouth daily.   esomeprazole 40 MG capsule Commonly known as:  NEXIUM Take 40 mg by mouth daily as needed (acid reflux).   fenofibrate 145 MG tablet Commonly known as:  TRICOR Take 145 mg by mouth daily before breakfast.   fluticasone 50 MCG/ACT nasal spray Commonly known as:  FLONASE Place 2 sprays into both nostrils daily as needed for allergies or rhinitis.   Fluticasone-Salmeterol 100-50 MCG/DOSE Aepb Commonly known as:  ADVAIR Inhale 1 puff into the lungs 2 (two) times daily as needed (for respiratory issues.).   losartan-hydrochlorothiazide  100-12.5 MG tablet Commonly known as:  HYZAAR Take 1 tablet by mouth daily.   methocarbamol 500 MG tablet Commonly known as:  ROBAXIN Take 1 tablet (500 mg total) by mouth 3 (three) times daily as needed.   oxyCODONE-acetaminophen 5-325 MG tablet Commonly known as:  ROXICET Take 1-2 tablets by mouth every 4 (four) hours as needed for severe pain.   probenecid 500 MG tablet Commonly known as:  BENEMID Take 500 mg by mouth daily before breakfast.   rosuvastatin 10 MG tablet Commonly known as:  CRESTOR Take 10 mg by mouth daily.   tadalafil 5 MG tablet Commonly known as:  CIALIS Take 5 mg by mouth daily.       Diagnostic Studies: Dg Shoulder Right Port  Result Date: 10/11/2016 CLINICAL DATA:  Status post right shoulder replacement. EXAM: PORTABLE RIGHT SHOULDER COMPARISON:  03/10/2007 FINDINGS: The patient has undergone a right shoulder arthroplasty. The hardware components are in anatomic alignment. No complications identified. Gas is identified within the joint space. IMPRESSION: 1. No complications status post right shoulder arthroplasty. Electronically Signed   By: Kerby Moors M.D.   On: 10/11/2016 15:23    Disposition: 01-Home or Self Care  Discharge Instructions    Call MD / Call 911    Complete by:  As directed    If you experience chest pain or shortness of breath, CALL 911 and be transported to the hospital emergency room.  If you develope a fever above 101 F, pus (white drainage) or increased drainage or redness at the wound, or calf pain, call your surgeon's office.   Constipation Prevention    Complete by:  As directed    Drink plenty of fluids.  Prune juice may be helpful.  You may use a stool softener, such as Colace (over the counter) 100 mg twice a day.  Use MiraLax (over the counter) for constipation as needed.   Diet - low sodium heart healthy    Complete by:  As directed    Driving restrictions    Complete by:  As directed    No driving for 2 weeks    Increase activity slowly as tolerated    Complete by:  As directed    Lifting restrictions    Complete by:  As directed    No lifting for 12 weeks      Follow-up Information    Netta Cedars, MD. Call in 2 week(s).   Specialty:  Orthopedic Surgery Why:  [phone] Contact information: [address] [place] [address] [place] [address] [phone]  Signed: Kristyn Obyrne,STEVEN R 10/12/2016, 6:30 AM

## 2016-10-12 NOTE — Discharge Instructions (Signed)
Ice to the shoulder as much as possible.  Ok to remove sling while seated, otherwise keep on while up and around and while sleeping.   Keep pillow propped behind the elbow for support and to keep your arm resting across waist.  OK to use right arm for light activity, hand to face for example  No Driving. No push pull or lift  Keep incision covered and clean and dry for one week then ok to get it wet in the shower.  Follow up in two weeks with Dr [name] in the office [phone]

## 2016-10-12 NOTE — Care Management Note (Signed)
Case Management Note  Patient Details  Name: [name] MRN: [id number] Date of Birth: [birth year]  Subjective/Objective:                 Patient from home with wife, with order to DC to home today. No CM consults, orders, or needs identified at this time.   Action/Plan:  DC to home with spouse.  Expected Discharge Date:  10/12/16               Expected Discharge Plan:  Home/Self Care  In-House Referral:     Discharge planning Services  CM Consult  Post Acute Care Choice:    Choice offered to:     DME Arranged:    DME Agency:     HH Arranged:    HH Agency:     Status of Service:  Completed, signed off  If discussed at [employer] of Stay Meetings, dates discussed:    Additional Comments:  Carles Collet, RN 10/12/2016, 8:20 AM

## 2016-10-12 NOTE — Progress Notes (Signed)
Orthopedics Progress Note  Subjective: Patient reports pain after block wore off, controlled with po meds  Objective:  Vitals:   10/12/16 0030 10/12/16 0430  BP: 124/76 138/90  Pulse: 89 92  Resp: 18 20  Temp: 98.1 F (36.7 C) 98 F (36.7 C)    General: Awake and alert  Musculoskeletal: right shoulder dressing CDI Neurovascularly intact  Lab Results  Component Value Date   WBC 7.9 09/27/2016   HGB 12.7 (L) 10/12/2016   HCT 40.6 10/12/2016   MCV 98.1 09/27/2016   PLT 166 09/27/2016       Component Value Date/Time   NA 137 10/12/2016 0214   K 3.9 10/12/2016 0214   CL 103 10/12/2016 0214   CO2 25 10/12/2016 0214   GLUCOSE 127 (H) 10/12/2016 0214   BUN 15 10/12/2016 0214   CREATININE 1.25 (H) 10/12/2016 0214   CALCIUM 9.1 10/12/2016 0214   GFRNONAA 55 (L) 10/12/2016 0214   GFRAA >60 10/12/2016 0214    Lab Results  Component Value Date   INR 1.10 10/11/2016   INR 1.18 09/08/2015   INR 2.22 (H) 09/04/2014    Assessment/Plan: POD #1 s/p Procedure(s): REVERSE RIGHT SHOULDER ARTHROPLASTY STEROID INJECTION LEFT SHOULDER Plan to discharge to home after therapy this morning. No driving Follow up in two weeks  [name]. [name], MD 10/12/2016 6:27 AM

## 2016-10-12 NOTE — Progress Notes (Signed)
Pt discharge education and instructions completed with pt and spouse at the bedside. Pt discharge home with spouse to transport him home. Pt RUE remains in sling; incision dsg remains unremarkable and intact. Pt handed his prescriptions for Roxicet and Robaxin. Pt transported off unit via wheelchair with belongings and spouse to the side. Delia Heady RN

## 2016-10-12 NOTE — Progress Notes (Signed)
Occupational Therapy Treatment Patient Details Name: [name] MRN: [id number] DOB: 1940/06/04 Today's Date: 10/12/2016    History of present illness s/p R reverse TSA. PMH includes B TKA.    OT comments  Pt progressing well toward OT goals. Educated pt and significant other concerning dressing and bathing compensatory techniques including donning/doffing sling and pt requiring overall mod assist for dressing and min assist for bathing. His significant other demonstrates ability to provide all necessary assistance. Educated pt concerning conservative shoulder protocol and elbow/wrist/hand AROM with handout provided. Pt able to complete HEP with overall supervision this session. Pt demonstrates understanding of all topics and additionally instructed pt in appropriate bed mobility techniques and positioning of R UE for sleep. Noted plan for D/C today and feel pt safe from OT perspective for D/C home with assistance from significant other. Will continue to follow while admitted to progress with independence with ADL.    Follow Up Recommendations  No OT follow up;Supervision - Intermittent    Equipment Recommendations  None recommended by OT    Recommendations for Other Services      Precautions / Restrictions Precautions Precautions: Shoulder Type of Shoulder Precautions: conservative protocol - gentle ADLs ok; no AROM shoulder; AROM elbow wrist/hand Shoulder Interventions: Shoulder sling/immobilizer Precaution Booklet Issued: Yes (comment) Required Braces or Orthoses: Sling Restrictions Weight Bearing Restrictions: Yes RUE Weight Bearing: Non weight bearing       Mobility Bed Mobility Overal bed mobility: Needs Assistance Bed Mobility: Supine to Sit     Supine to sit: Supervision     General bed mobility comments: vc for correct positioning of RUE  Transfers Overall transfer level: Modified independent                    Balance Overall balance assessment: No  apparent balance deficits (not formally assessed)                                         ADL either performed or assessed with clinical judgement   ADL Overall ADL's : Needs assistance/impaired                 Upper Body Dressing : Moderate assistance;Sitting;With caregiver independent assisting Upper Body Dressing Details (indicate cue type and reason): Caregiver able to assist with all aspects. Education provided concerning technique to don/doff sling.  Lower Body Dressing: Moderate assistance;With caregiver independent assisting;Sit to/from stand   Toilet Transfer: Modified Independent;Ambulation;BSC           Functional mobility during ADLs: Modified independent General ADL Comments: Educated pt concerning dressing and bathing techniques with pt and significant other. Required VC's for technique with donning/doffing sling.      Vision   Vision Assessment?: No apparent visual deficits   Perception     Praxis      Cognition Arousal/Alertness: Awake/alert Behavior During Therapy: WFL for tasks assessed/performed Overall Cognitive Status: Within Functional Limits for tasks assessed                                          Exercises Exercises: Shoulder Shoulder Exercises Elbow Flexion: Right;10 reps;AROM Elbow Extension: AROM;Right;10 reps   Shoulder Instructions Shoulder Instructions Donning/doffing shirt without moving shoulder: Moderate assistance;Caregiver independent with task Method for sponge bathing under operated UE: Minimal  assistance;Caregiver independent with task Donning/doffing sling/immobilizer: Moderate assistance;Caregiver independent with task Correct positioning of sling/immobilizer: Moderate assistance;Caregiver independent with task ROM for elbow, wrist and digits of operated UE: Supervision/safety (provided handout) Sling wearing schedule (on at all times/off for ADL's): Supervision/safety Proper  positioning of operated UE when showering: Supervision/safety Positioning of UE while sleeping: Supervision/safety     General Comments      Pertinent Vitals/ Pain       Pain Assessment: 0-10 Pain Score: 6  Pain Location: shoulder Pain Descriptors / Indicators: Aching;Operative site guarding;Sore Pain Intervention(s): Monitored during session;Repositioned  Home Living                                          Prior Functioning/Environment              Frequency  Min 3X/week        Progress Toward Goals  OT Goals(current goals can now be found in the care plan section)  Progress towards OT goals: Progressing toward goals  Acute Rehab OT Goals Patient Stated Goal: to go home ASAP OT Goal Formulation: With patient Time For Goal Achievement: 10/18/16 Potential to Achieve Goals: Good ADL Goals Pt Will Perform Upper Body Bathing: with supervision;with caregiver independent in assisting;sitting Pt Will Perform Lower Body Bathing: with supervision;with caregiver independent in assisting;sit to/from stand Pt Will Perform Upper Body Dressing: with supervision;with caregiver independent in assisting;sitting Pt Will Perform Toileting - Clothing Manipulation and hygiene: with modified independence;sit to/from stand Pt/caregiver will Perform Home Exercise Program: Increased ROM;Right Upper extremity;With written HEP provided (per protocol)  Plan Discharge plan remains appropriate    Co-evaluation                 AM-PAC PT "6 Clicks" Daily Activity     Outcome Measure   Help from another person eating meals?: None Help from another person taking care of personal grooming?: A Little Help from another person toileting, which includes using toliet, bedpan, or urinal?: A Little Help from another person bathing (including washing, rinsing, drying)?: A Little Help from another person to put on and taking off regular upper body clothing?: A Lot Help from  another person to put on and taking off regular lower body clothing?: A Lot 6 Click Score: 17    End of Session Equipment Utilized During Treatment:  (R shoulder sling)  OT Visit Diagnosis: Muscle weakness (generalized) (M62.81);Pain Pain - Right/Left: Right Pain - part of body: Shoulder   Activity Tolerance Patient tolerated treatment well   Patient Left in bed;with call bell/phone within reach;with SCD's reapplied   Nurse Communication Mobility status (OT education completed)        Time: 1610-9604 OT Time Calculation (min): 19 min  Charges: OT General Charges $OT Visit: 1 Procedure OT Treatments $Self Care/Home Management : 8-22 mins  Norman Herrlich, MS OTR/L  Pager: [place] A Trae Bovenzi 10/12/2016, 8:31 AM

## 2016-10-15 ENCOUNTER — Encounter (HOSPITAL_COMMUNITY): Payer: Self-pay | Admitting: Orthopedic Surgery

## 2016-10-24 DIAGNOSIS — M25311 Other instability, right shoulder: Secondary | ICD-10-CM | POA: Diagnosis not present

## 2016-10-24 DIAGNOSIS — Z471 Aftercare following joint replacement surgery: Secondary | ICD-10-CM | POA: Diagnosis not present

## 2016-10-24 DIAGNOSIS — Z96611 Presence of right artificial shoulder joint: Secondary | ICD-10-CM | POA: Diagnosis not present

## 2016-11-05 DIAGNOSIS — K1370 Unspecified lesions of oral mucosa: Secondary | ICD-10-CM | POA: Diagnosis not present

## 2016-11-05 DIAGNOSIS — C069 Malignant neoplasm of mouth, unspecified: Secondary | ICD-10-CM | POA: Diagnosis not present

## 2016-11-14 ENCOUNTER — Other Ambulatory Visit: Payer: Self-pay | Admitting: Dermatology

## 2016-11-14 DIAGNOSIS — L570 Actinic keratosis: Secondary | ICD-10-CM | POA: Diagnosis not present

## 2016-11-14 DIAGNOSIS — C44629 Squamous cell carcinoma of skin of left upper limb, including shoulder: Secondary | ICD-10-CM | POA: Diagnosis not present

## 2016-11-14 DIAGNOSIS — C44622 Squamous cell carcinoma of skin of right upper limb, including shoulder: Secondary | ICD-10-CM | POA: Diagnosis not present

## 2016-11-21 DIAGNOSIS — Z96611 Presence of right artificial shoulder joint: Secondary | ICD-10-CM | POA: Diagnosis not present

## 2016-11-21 DIAGNOSIS — Z471 Aftercare following joint replacement surgery: Secondary | ICD-10-CM | POA: Diagnosis not present

## 2016-12-02 DIAGNOSIS — Z6830 Body mass index (BMI) 30.0-30.9, adult: Secondary | ICD-10-CM | POA: Diagnosis not present

## 2016-12-02 DIAGNOSIS — I10 Essential (primary) hypertension: Secondary | ICD-10-CM | POA: Diagnosis not present

## 2016-12-02 DIAGNOSIS — E784 Other hyperlipidemia: Secondary | ICD-10-CM | POA: Diagnosis not present

## 2016-12-02 DIAGNOSIS — R410 Disorientation, unspecified: Secondary | ICD-10-CM | POA: Diagnosis not present

## 2016-12-02 DIAGNOSIS — H538 Other visual disturbances: Secondary | ICD-10-CM | POA: Diagnosis not present

## 2016-12-02 DIAGNOSIS — R42 Dizziness and giddiness: Secondary | ICD-10-CM | POA: Diagnosis not present

## 2016-12-06 ENCOUNTER — Other Ambulatory Visit: Payer: Self-pay

## 2016-12-06 ENCOUNTER — Telehealth: Payer: Self-pay | Admitting: Gastroenterology

## 2016-12-06 NOTE — Telephone Encounter (Signed)
Left message for patient that he is correct, due for next colonoscopy on May 2019 since entire colon could not be visualized at last exam. Changed recall in our system.

## 2016-12-09 DIAGNOSIS — H5310 Unspecified subjective visual disturbances: Secondary | ICD-10-CM | POA: Diagnosis not present

## 2016-12-25 DIAGNOSIS — L905 Scar conditions and fibrosis of skin: Secondary | ICD-10-CM | POA: Diagnosis not present

## 2016-12-25 DIAGNOSIS — L570 Actinic keratosis: Secondary | ICD-10-CM | POA: Diagnosis not present

## 2016-12-25 DIAGNOSIS — Z85828 Personal history of other malignant neoplasm of skin: Secondary | ICD-10-CM | POA: Diagnosis not present

## 2017-01-07 DIAGNOSIS — Z96611 Presence of right artificial shoulder joint: Secondary | ICD-10-CM | POA: Diagnosis not present

## 2017-01-07 DIAGNOSIS — Z471 Aftercare following joint replacement surgery: Secondary | ICD-10-CM | POA: Diagnosis not present

## 2017-02-03 ENCOUNTER — Ambulatory Visit (INDEPENDENT_AMBULATORY_CARE_PROVIDER_SITE_OTHER): Payer: Medicare Other | Admitting: Internal Medicine

## 2017-02-03 ENCOUNTER — Encounter: Payer: Self-pay | Admitting: Internal Medicine

## 2017-02-03 VITALS — BP 108/64 | HR 95 | Ht 70.0 in | Wt 204.6 lb

## 2017-02-03 DIAGNOSIS — I6522 Occlusion and stenosis of left carotid artery: Secondary | ICD-10-CM

## 2017-02-03 DIAGNOSIS — R0989 Other specified symptoms and signs involving the circulatory and respiratory systems: Secondary | ICD-10-CM

## 2017-02-03 DIAGNOSIS — F17210 Nicotine dependence, cigarettes, uncomplicated: Secondary | ICD-10-CM

## 2017-02-03 DIAGNOSIS — J849 Interstitial pulmonary disease, unspecified: Secondary | ICD-10-CM | POA: Diagnosis not present

## 2017-02-03 DIAGNOSIS — Z86711 Personal history of pulmonary embolism: Secondary | ICD-10-CM

## 2017-02-03 NOTE — Progress Notes (Signed)
Subjective:     Patient ID: [name], male   DOB: 1940-08-04, 76 y.o.   MRN: [id number]  HPI    IOV 01/04/2015  Chief Complaint  Patient presents with  . Pulmonary Consult    Pt referred by Dr. Shon Baton for PE. Pt denies SOB and CP/tightness. Pt c/o heomptysis, decreasing in blood volume that pt is coughing up.  Pt states the hemoptysis started on 8.8.16 with pain in right shoulder.     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 He had negative cardiac stress test in March 2016. In April 2016 had right total knee replacement. After that he has been pretty active. He denies any long trips. Around first week of August 2016 noticed left calf swelling that then improved. Then on 12/26/2014 started having right acute scapular pain that was pleuritic and severe associated with hemoptysis. This is per his history and also per review of referring physician's chart which echo the same information. Physical exam at primary care physician noticed bibasal crackles. He was subjected to CT angiography that showed bilateral saddle emboli. I do not have the visual images with me but I do have the report. The CT scan was done 12/27/2014 at [facility] on [address] [place]. Reports says but large bilateral pulmonary emboli in the distal main pulmonary arteries as well as lobar arteries. There is associated scarring and atelectasis. This opacity in the right middle lobe which could represent lung infarct or residual scarring. He was then started on ELIQUIS herapy as an outpatient. He was never on oxygen therapy. He was never hospitalized. Since then he significantly improved. Hemoptysis is resolved. Right infrascapular pain is also improved significantly. Currently he is feeling fine and is actively working. He denies any syncope orany problems. 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 Testosterone patch    OV 07/20/2015  Chief Complaint  Patient presents with  . Follow-up    Pt denies SOB, cough, and CP/tightness. Pt denies any current complaints at this time.     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 Several risk factors noted above.  He has had no further problems. No dyspnea. No bleeding issues. No hemoptysis no chest pain no wheezing no cough. He is no longer on the testosterone patch but wanted about restarting if he felt that it would be okay. Other risk factors of being overweight and smoking cigars continue. In addition over the summer he plans to go scuba diving in the [place]. He says that he will prepare himself to cut down risk of injury. He has been advised that he needs lifelong anticoagulation by his primary care physician. He also says that in the interim he has had several CT chest to follow-up resolution of the blood clot because he desired dose. He showed me one recent CT chest which I visualize personally and it looks like there is no blood clot. In addition Pulm parenchyma does not show any lung cancer.  OV 06/18/2016  Chief Complaint  Patient presents with  . Follow-up    Pt states he has had not breahting issues since last OV in 07/2015. Pt states he does have a prod cough with clear mucus. Pt denies SOB and  CP/tightness.   Follow-up for pulmonary embolism in August 2016. Risk factors documented below. THESE continues to be overweight and he continues to smoke. He also has bilateral varicose veins. He's no longer on the testosterone patch. He has not had any knee surgery. He did go to [place] and did scuba diving without any trauma. He plans to go scuba diving again in March 2018. He's not had any problems with eliquis and he does not mind continuing this even with scuba diving. He  is open to having d-dimer blood test. 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 He continues to smoke. He says his recent cardiology evaluation which was negative and his been cleared for 2 years. He finds it very difficult to quit  Cancer screening: He wants to get cancer screening of the lungs done. Last CT chest was 2016. He wants to get this done at [facility].   OV 02/03/2017  Chief Complaint  Patient presents with  . Follow-up    Pt states that he is currently on predinsone x10 days due to arthritis. Otherwise states he is doing good. States that he has had a dry cough x1 week due to sinus drainage. Denies any CP or SOB.    76 year old male with pulmonary embolism in August 2016. Presents for follow-up. In the interim in June 2017 in January 2018 his d-dimer was normal. However he still petrified about the risk of recurrent pulmonary embolism he is continued on full dose eiquis.  he even scuba dives [place] on full dose anticoagulation. I've advised him that during the time of scuba diving he can reduce or stop his anticoagulation but he is continued this without any trauma at full dose anticoagulation. Most recently went scuba diving a week ago approximately while in [place]. Otherwise is feeling felt there is no dyspnea. He continues to smoke which is a minor risk factor for his pulmonary embolism.    new finding of bilateral bibasal crackles. He did have a CT scan at an outside imaging center earlier this year that showed only biapical scarring. His never had HRCT. He denies any dyspnea or cough   Results for Derek, Bender (MRN [id number]) as of 02/03/2017 10:51  Ref. Range 11/02/2015 09:09 06/18/2016 09:42  D-Dimer, America Brown Latest Ref Range: <0.50 mcg/mL FEU 0.33 0.33     has a past medical history of Anxiety; Arthritis; Blood transfusion without reported diagnosis; Cataract; Chronic kidney disease; Colon polyps; Complication of anesthesia; GERD (gastroesophageal reflux disease); History of kidney stones; Hyperlipidemia; Hypertension; Prostate hypertrophy; Pulmonary embolism ([place]) (12/2014); Seasonal allergies; Skin cancer; and Sleep apnea.   reports that he has been smoking Cigars.  He has smoked for the past 50.00 years. He has never used smokeless tobacco.  Past Surgical History:  Procedure Laterality Date  . CAROTID ENDARTERECTOMY Right Oct 01, 2013   CE  . COLONOSCOPY    . ENDARTERECTOMY Right 10/01/2013   Procedure: ENDARTERECTOMY CAROTID-RIGHT;  Surgeon: Rosetta Posner, MD;  Location: [facility];  Service: Vascular;  Laterality: Right;  . ESOPHAGOGASTRODUODENOSCOPY ENDOSCOPY  10-12-2013  . EXCISION ORAL  LESION WITH CO2 LASER N/A 09/08/2015   Procedure: EXCISION ORAL LESION WITH CO2 LASER;  Surgeon: Jerrell Belfast, MD;  Location: [facility];  Service: ENT;  Laterality: N/A;  . EYE SURGERY Bilateral    cataracts, W/IOL  . JOINT REPLACEMENT Right September 02, 2014   Knee  . JOINT REPLACEMENT Left 2012   Knee  . PATCH ANGIOPLASTY Right 10/01/2013   Procedure: PATCH ANGIOPLASTY USING HEMASHIELD FINESSE PATCH;  Surgeon: Rosetta Posner, MD;  Location: [facility];  Service: Vascular;  Laterality: Right;  . removal of melonomia Left    shoulder  . REPLACEMENT TOTAL KNEE Left   . REVERSE SHOULDER ARTHROPLASTY Right 10/11/2016   Procedure: REVERSE RIGHT SHOULDER ARTHROPLASTY;  Surgeon: Netta Cedars, MD;  Location: [facility];  Service: Orthopedics;  Laterality: Right;  . RHINOPLASTY     1999  . RHINOPLASTY    . SKIN CANCER EXCISION     squamous cell cancer under tongue  . SKIN SURGERY     squamous cell carcinoma removed in  various places,melanoma removed from L shoulder  . sleep apnea surgery  2000  . STERIOD INJECTION Left 10/11/2016   Procedure: STEROID INJECTION LEFT SHOULDER;  Surgeon: Netta Cedars, MD;  Location: [facility];  Service: Orthopedics;  Laterality: Left;  . TOTAL KNEE ARTHROPLASTY Right 09/02/2014   Procedure: RIGHT TOTAL KNEE ARTHROPLASTY;  Surgeon: Netta Cedars, MD;  Location: [facility];  Service: Orthopedics;  Laterality: Right;    Allergies  Allergen Reactions  . Cefaclor Other (See Comments)     skin split  . Penicillins Rash    Has patient had a PCN reaction causing immediate rash, facial/tongue/throat swelling, SOB or lightheadedness with hypotension:unsure Has patient had a PCN reaction causing severe rash involving mucus membranes or skin necrosis:unsure Has patient had a PCN reaction that required hospitalization:No Has patient had a PCN reaction occurring within the last 10 years:No--childhood allergy If all of the above answers are "NO", then may proceed with Cephalosporin use.     Immunization History  Administered Date(s) Administered  . Influenza Split 02/17/2014  . Influenza,inj,Quad PF,6+ Mos 01/19/2015, 02/18/2016  . Pneumococcal-Unspecified 05/20/2012  . Zoster 05/20/2012    Family History  Problem Relation Age of Onset  . Diverticulosis Mother   . Skin cancer Mother   . Varicose Veins Mother   . Hyperlipidemia Father   . Heart disease Father   . Heart attack Father   . Colon cancer Neg Hx   . Esophageal cancer Neg Hx   . Rectal cancer Neg Hx   . Stomach cancer Neg Hx      Current Outpatient Prescriptions:  .  acetaminophen (TYLENOL) 650 MG CR tablet, Take 650 mg by mouth every 8 (eight) hours as needed for pain., Disp: , Rfl:  .  apixaban (ELIQUIS) 5 MG TABS tablet, Take 10 mg by mouth daily. , Disp: , Rfl:  .  B Complex-C (B-COMPLEX WITH VITAMIN C) tablet, Take 1 tablet by mouth daily., Disp: , Rfl:  .  Cholecalciferol ([employer] VITAMIN D3) 10000 units CAPS, Take  10,000 Units by mouth daily., Disp: , Rfl:  .  esomeprazole (NEXIUM) 40 MG capsule, Take 40 mg by mouth daily as needed (acid reflux). , Disp: , Rfl:  .  fenofibrate (TRICOR) 145 MG tablet, Take 145 mg by mouth daily before breakfast. , Disp: , Rfl:  .  fluticasone (FLONASE) 50 MCG/ACT nasal spray, Place 2 sprays into both nostrils daily as needed for allergies or rhinitis., Disp: ,  Rfl:  .  Fluticasone-Salmeterol (ADVAIR) 100-50 MCG/DOSE AEPB, Inhale 1 puff into the lungs 2 (two) times daily as needed (for respiratory issues.)., Disp: , Rfl:  .  losartan-hydrochlorothiazide (HYZAAR) 100-12.5 MG tablet, Take 1 tablet by mouth daily., Disp: , Rfl:  .  methocarbamol (ROBAXIN) 500 MG tablet, Take 1 tablet (500 mg total) by mouth 3 (three) times daily as needed., Disp: 60 tablet, Rfl: 1 .  predniSONE (STERAPRED UNI-PAK 48 TAB) 10 MG (48) TBPK tablet, Take by mouth daily. Pt stated he started taking 01/28/17., Disp: , Rfl:  .  probenecid (BENEMID) 500 MG tablet, Take 500 mg by mouth daily before breakfast. , Disp: , Rfl:  .  rosuvastatin (CRESTOR) 10 MG tablet, Take 10 mg by mouth daily., Disp: , Rfl:  .  tadalafil (CIALIS) 5 MG tablet, Take 5 mg by mouth daily., Disp: , Rfl:    Review of Systems     Objective:   Physical Exam  Constitutional: He is oriented to person, place, and time. He appears well-developed and well-nourished. No distress.  HENT:  Head: Normocephalic and atraumatic.  Right Ear: External ear normal.  Left Ear: External ear normal.  Mouth/Throat: Oropharynx is clear and moist. No oropharyngeal exudate.  Eyes: Pupils are equal, round, and reactive to light. Conjunctivae and EOM are normal. Right eye exhibits no discharge. Left eye exhibits no discharge. No scleral icterus.  Neck: Normal range of motion. Neck supple. No JVD present. No tracheal deviation present. No thyromegaly present.  Cardiovascular: Normal rate, regular rhythm and intact distal pulses.  Exam reveals no gallop  and no friction rub.   No murmur heard. Pulmonary/Chest: Effort normal. No respiratory distress. He has no wheezes. He has rales. He exhibits no tenderness.  Bilateral bibasal crackles  Abdominal: Soft. Bowel sounds are normal. He exhibits no distension and no mass. There is no tenderness. There is no rebound and no guarding.  Musculoskeletal: Normal range of motion. He exhibits no edema or tenderness.  Lymphadenopathy:    He has no cervical adenopathy.  Neurological: He is alert and oriented to person, place, and time. He has normal reflexes. No cranial nerve deficit. Coordination normal.  Skin: Skin is warm and dry. No rash noted. He is not diaphoretic. No erythema. No pallor.  Psychiatric: He has a normal mood and affect. His behavior is normal. Judgment and thought content normal.  Nursing note and vitals reviewed.  Vitals:   02/03/17 1035  BP: 108/64  Pulse: 95  SpO2: 96%  Weight: 204 lb 9.6 oz (92.8 kg)  Height: 5\' 10"  (1.778 m)    Estimated body mass index is 29.36 kg/m as calculated from the following:   Height as of this encounter: 5\' 10"  (1.778 m).   Weight as of this encounter: 204 lb 9.6 oz (92.8 kg).      Assessment:       ICD-10-CM   1. History of pulmonary embolism Z86.711   2. Smoking greater than 40 pack years F17.210   3. Bibasilar crackles R09.89        Plan:     History of pulmonary embolism - 2 years and 2 months complete on full dose eliquis - June 2017 and jan 2018 d-dimer normal  -ongoing risk factor is smoking and varicose veints (minor); try to quit smoking - check d-dimer 02/03/2017 and if normal we can reduce to eliquis 5mg  once daily as best balance between prevention and bleeding risk  Smoking greater than 40 pack years Bibasilar crackles -  bottom lung crackles appear to be new  - do High Resolution CT chest without contrast on ILD protocol at our our health system. DO both Supine and Prone Images. Only  Dr Lorin Picket or Dr Salvatore Marvel  Dr. Vinnie Langton to read.  - will call with results  Followup  - 9 months but can be sooner dpeendent on CT results    Dr. Brand Males, M.D., [facility].C.P Pulmonary and Critical Care Medicine Staff Physician [facility] Pager: [phone], If no answer or between  15:00h - 7:00h: call [phone]  02/03/2017 11:03 AM

## 2017-02-03 NOTE — Patient Instructions (Signed)
History of pulmonary embolism - 2 years and 2 months complete on full dose eliquis - June 2017 and jan 2018 d-dimer normal  -ongoing risk factor is smoking and varicose veints (minor) - check d-dimer 02/03/2017 and if normal we can reduce to eliquis 5mg  once daily as best balance between prevention and bleeding risk  Smoking greater than 40 pack years Bibasilar crackles - bottom lung crackles appear to be new  - do High Resolution CT chest without contrast on ILD protocol at our our health system. DO both Supine and Prone Images. Only  Dr Lorin Picket or Dr Salvatore Marvel Dr. Vinnie Langton to read.  - will call with results  Followup  - 9 months but can be sooner dpeendent on CT results

## 2017-02-05 DIAGNOSIS — K1370 Unspecified lesions of oral mucosa: Secondary | ICD-10-CM | POA: Diagnosis not present

## 2017-02-05 DIAGNOSIS — C069 Malignant neoplasm of mouth, unspecified: Secondary | ICD-10-CM | POA: Diagnosis not present

## 2017-02-06 DIAGNOSIS — L570 Actinic keratosis: Secondary | ICD-10-CM | POA: Diagnosis not present

## 2017-02-12 ENCOUNTER — Ambulatory Visit (INDEPENDENT_AMBULATORY_CARE_PROVIDER_SITE_OTHER)
Admission: RE | Admit: 2017-02-12 | Discharge: 2017-02-12 | Disposition: A | Discharge status: Home / Self Care | Payer: Medicare Other | Source: Ambulatory Visit | Attending: Internal Medicine | Admitting: Internal Medicine

## 2017-02-12 ENCOUNTER — Telehealth: Payer: Self-pay | Admitting: Internal Medicine

## 2017-02-12 DIAGNOSIS — J849 Interstitial pulmonary disease, unspecified: Secondary | ICD-10-CM | POA: Diagnosis not present

## 2017-02-12 NOTE — Telephone Encounter (Signed)
xxx

## 2017-02-13 ENCOUNTER — Telehealth: Payer: Self-pay | Admitting: Internal Medicine

## 2017-02-13 DIAGNOSIS — N401 Enlarged prostate with lower urinary tract symptoms: Secondary | ICD-10-CM | POA: Diagnosis not present

## 2017-02-13 DIAGNOSIS — R351 Nocturia: Secondary | ICD-10-CM | POA: Diagnosis not present

## 2017-02-13 NOTE — Telephone Encounter (Signed)
MR please review CT.    Advise pt we have to wait for review and call him back with results.

## 2017-02-13 NOTE — Telephone Encounter (Signed)
Pt is aware of results and voiced his understanding. Pt request that CT be faxed to PCP. CT has been faxed via epic. Nothing further needed.

## 2017-02-13 NOTE — Telephone Encounter (Signed)
  No ILD No cancer  The crackles was due to some post inflmattory scarring and mild RLL bronchiectasis. No need to worry about it  Ct Chest High Resolution  Result Date: 02/12/2017 CLINICAL DATA:  Crackles on exam. Recurrent bronchitis. Evaluate for interstitial lung disease. History of pulmonary embolism. EXAM: CT CHEST WITHOUT CONTRAST TECHNIQUE: Multidetector CT imaging of the chest was performed following the standard protocol without intravenous contrast. High resolution imaging of the lungs, as well as inspiratory and expiratory imaging, was performed. COMPARISON:  03/10/2007 chest radiograph. FINDINGS: Cardiovascular: Normal heart size. No significant pericardial fluid/thickening. Left main, left anterior descending, left circumflex and right coronary atherosclerosis. Atherosclerotic thoracic aorta with ectatic 4.1 cm ascending thoracic aorta. Normal caliber pulmonary arteries. Mediastinum/Nodes: No discrete thyroid nodules. Unremarkable esophagus. No pathologically enlarged axillary, mediastinal or gross hilar lymph nodes, noting limited sensitivity for the detection of hilar adenopathy on this noncontrast study. Lungs/Pleura: No pneumothorax. No pleural effusion. Calcified 2 mm medial apical right upper lobe granuloma. No acute consolidative airspace disease, lung masses or significant pulmonary nodules. No significant air trapping on the expiration sequence. There a few thin scattered parenchymal bands in the mid to lower lungs bilaterally, compatible with postinfectious/postinflammatory scarring. No significant regions of subpleural reticulation, ground-glass attenuation, traction bronchiectasis or frank honeycombing. Minimal cylindrical bronchiectasis in the medial right lower lobe. Upper abdomen: Simple 1.3 cm anterior liver cyst. Two additional scattered subcentimeter hypodense liver lesions are too small to characterize and require no follow-up unless the patient has risk factors for liver  malignancy. Right adrenal 1.9 cm adenoma with density 6 HU. Left adrenal 2.2 cm adenoma with density -11 HU. Musculoskeletal: No aggressive appearing focal osseous lesions. Partially visualized right total shoulder arthroplasty. Moderate thoracic spondylosis . IMPRESSION: 1. Minimal cylindrical bronchiectasis in the medial right lower lobe. 2. Scattered thin parenchymal bands in the mid to lower lungs bilaterally, compatible with postinfectious/postinflammatory scarring . 3. Left main and 3 vessel coronary atherosclerosis. 4. Ectatic 4.1 cm ascending thoracic aorta. Recommend annual imaging followup by CTA or MRA. This recommendation follows 2010 ACCF/AHA/AATS/ACR/ASA/SCA/SCAI/SIR/STS/SVM Guidelines for the Diagnosis and Management of Patients with Thoracic Aortic Disease. Circulation. 2010; 121: N470-J628. 5. Bilateral adrenal adenomas. Aortic Atherosclerosis (ICD10-I70.0). Electronically Signed   By: Ilona Sorrel M.D.   On: 02/12/2017 09:52

## 2017-03-11 DIAGNOSIS — H538 Other visual disturbances: Secondary | ICD-10-CM | POA: Diagnosis not present

## 2017-03-11 DIAGNOSIS — N183 Chronic kidney disease, stage 3 (moderate): Secondary | ICD-10-CM | POA: Diagnosis not present

## 2017-03-11 DIAGNOSIS — N401 Enlarged prostate with lower urinary tract symptoms: Secondary | ICD-10-CM | POA: Diagnosis not present

## 2017-03-11 DIAGNOSIS — I1310 Hypertensive heart and chronic kidney disease without heart failure, with stage 1 through stage 4 chronic kidney disease, or unspecified chronic kidney disease: Secondary | ICD-10-CM | POA: Diagnosis not present

## 2017-03-11 DIAGNOSIS — Z6831 Body mass index (BMI) 31.0-31.9, adult: Secondary | ICD-10-CM | POA: Diagnosis not present

## 2017-03-11 DIAGNOSIS — E668 Other obesity: Secondary | ICD-10-CM | POA: Diagnosis not present

## 2017-03-11 DIAGNOSIS — I2699 Other pulmonary embolism without acute cor pulmonale: Secondary | ICD-10-CM | POA: Diagnosis not present

## 2017-03-11 DIAGNOSIS — I700 Atherosclerosis of aorta: Secondary | ICD-10-CM | POA: Insufficient documentation

## 2017-03-11 DIAGNOSIS — J479 Bronchiectasis, uncomplicated: Secondary | ICD-10-CM | POA: Insufficient documentation

## 2017-03-11 DIAGNOSIS — Z23 Encounter for immunization: Secondary | ICD-10-CM | POA: Diagnosis not present

## 2017-03-11 DIAGNOSIS — I6521 Occlusion and stenosis of right carotid artery: Secondary | ICD-10-CM | POA: Diagnosis not present

## 2017-03-11 DIAGNOSIS — C069 Malignant neoplasm of mouth, unspecified: Secondary | ICD-10-CM | POA: Diagnosis not present

## 2017-03-13 DIAGNOSIS — N401 Enlarged prostate with lower urinary tract symptoms: Secondary | ICD-10-CM | POA: Diagnosis not present

## 2017-03-13 DIAGNOSIS — N139 Obstructive and reflux uropathy, unspecified: Secondary | ICD-10-CM | POA: Diagnosis not present

## 2017-03-13 DIAGNOSIS — R351 Nocturia: Secondary | ICD-10-CM | POA: Diagnosis not present

## 2017-03-19 DIAGNOSIS — N139 Obstructive and reflux uropathy, unspecified: Secondary | ICD-10-CM | POA: Diagnosis not present

## 2017-03-19 DIAGNOSIS — R9341 Abnormal radiologic findings on diagnostic imaging of renal pelvis, ureter, or bladder: Secondary | ICD-10-CM | POA: Diagnosis not present

## 2017-03-19 DIAGNOSIS — N281 Cyst of kidney, acquired: Secondary | ICD-10-CM | POA: Diagnosis not present

## 2017-03-27 DIAGNOSIS — N281 Cyst of kidney, acquired: Secondary | ICD-10-CM | POA: Diagnosis not present

## 2017-03-27 DIAGNOSIS — N200 Calculus of kidney: Secondary | ICD-10-CM | POA: Diagnosis not present

## 2017-04-07 DIAGNOSIS — L570 Actinic keratosis: Secondary | ICD-10-CM | POA: Diagnosis not present

## 2017-04-07 DIAGNOSIS — L814 Other melanin hyperpigmentation: Secondary | ICD-10-CM | POA: Diagnosis not present

## 2017-04-07 DIAGNOSIS — D225 Melanocytic nevi of trunk: Secondary | ICD-10-CM | POA: Diagnosis not present

## 2017-04-07 DIAGNOSIS — Z85828 Personal history of other malignant neoplasm of skin: Secondary | ICD-10-CM | POA: Diagnosis not present

## 2017-04-07 DIAGNOSIS — Z85820 Personal history of malignant melanoma of skin: Secondary | ICD-10-CM | POA: Diagnosis not present

## 2017-04-07 DIAGNOSIS — L821 Other seborrheic keratosis: Secondary | ICD-10-CM | POA: Diagnosis not present

## 2017-05-07 DIAGNOSIS — C069 Malignant neoplasm of mouth, unspecified: Secondary | ICD-10-CM | POA: Diagnosis not present

## 2017-05-07 DIAGNOSIS — Z23 Encounter for immunization: Secondary | ICD-10-CM | POA: Diagnosis not present

## 2017-05-30 ENCOUNTER — Ambulatory Visit: Payer: Medicare Other | Admitting: Family

## 2017-05-30 ENCOUNTER — Encounter (HOSPITAL_COMMUNITY): Payer: Medicare Other

## 2017-06-02 ENCOUNTER — Ambulatory Visit (HOSPITAL_COMMUNITY)
Admission: RE | Admit: 2017-06-02 | Discharge: 2017-06-02 | Disposition: A | Discharge status: Home / Self Care | Payer: Medicare Other | Source: Ambulatory Visit | Attending: Family | Admitting: Family

## 2017-06-02 ENCOUNTER — Encounter: Payer: Self-pay | Admitting: Family

## 2017-06-02 ENCOUNTER — Ambulatory Visit (INDEPENDENT_AMBULATORY_CARE_PROVIDER_SITE_OTHER): Payer: Medicare Other | Admitting: Family

## 2017-06-02 VITALS — BP 102/70 | HR 101 | Temp 98.8°F | Resp 20 | Ht 70.0 in | Wt 206.0 lb

## 2017-06-02 DIAGNOSIS — F17200 Nicotine dependence, unspecified, uncomplicated: Secondary | ICD-10-CM

## 2017-06-02 DIAGNOSIS — Z9889 Other specified postprocedural states: Secondary | ICD-10-CM | POA: Insufficient documentation

## 2017-06-02 DIAGNOSIS — I6523 Occlusion and stenosis of bilateral carotid arteries: Secondary | ICD-10-CM | POA: Diagnosis not present

## 2017-06-02 NOTE — Patient Instructions (Signed)
Steps to Quit Smoking Smoking tobacco can be bad for your health. It can also affect almost every organ in your body. Smoking puts you and people around you at risk for many serious long-lasting (chronic) diseases. Quitting smoking is hard, but it is one of the best things that you can do for your health. It is never too late to quit. What are the benefits of quitting smoking? When you quit smoking, you lower your risk for getting serious diseases and conditions. They can include:  Lung cancer or lung disease.  Heart disease.  Stroke.  Heart attack.  Not being able to have children (infertility).  Weak bones (osteoporosis) and broken bones (fractures).  If you have coughing, wheezing, and shortness of breath, those symptoms may get better when you quit. You may also get sick less often. If you are pregnant, quitting smoking can help to lower your chances of having a baby of low birth weight. What can I do to help me quit smoking? Talk with your doctor about what can help you quit smoking. Some things you can do (strategies) include:  Quitting smoking totally, instead of slowly cutting back how much you smoke over a period of time.  Going to in-person counseling. You are more likely to quit if you go to many counseling sessions.  Using resources and support systems, such as: ? Online chats with a counselor. ? Phone quitlines. ? Printed self-help materials. ? Support groups or group counseling. ? Text messaging programs. ? Mobile phone apps or applications.  Taking medicines. Some of these medicines may have nicotine in them. If you are pregnant or breastfeeding, do not take any medicines to quit smoking unless your doctor says it is okay. Talk with your doctor about counseling or other things that can help you.  Talk with your doctor about using more than one strategy at the same time, such as taking medicines while you are also going to in-person counseling. This can help make  quitting easier. What things can I do to make it easier to quit? Quitting smoking might feel very hard at first, but there is a lot that you can do to make it easier. Take these steps:  Talk to your family and friends. Ask them to support and encourage you.  Call phone quitlines, reach out to support groups, or work with a counselor.  Ask people who smoke to not smoke around you.  Avoid places that make you want (trigger) to smoke, such as: ? Bars. ? Parties. ? Smoke-break areas at work.  Spend time with people who do not smoke.  Lower the stress in your life. Stress can make you want to smoke. Try these things to help your stress: ? Getting regular exercise. ? Deep-breathing exercises. ? Yoga. ? Meditating. ? Doing a body scan. To do this, close your eyes, focus on one area of your body at a time from head to toe, and notice which parts of your body are tense. Try to relax the muscles in those areas.  Download or buy apps on your mobile phone or tablet that can help you stick to your quit plan. There are many free apps, such as QuitGuide from the CDC (Centers for Disease Control and Prevention). You can find more support from smokefree.gov and other websites.  This information is not intended to replace advice given to you by your health care provider. Make sure you discuss any questions you have with your health care provider. Document Released: 03/02/2009 Document   Revised: 01/02/2016 Document Reviewed: 09/20/2014 Elsevier Interactive Patient Education  2018 Elsevier Inc.     Stroke Prevention Some health problems and behaviors may make it more likely for you to have a stroke. Below are ways to lessen your risk of having a stroke.  Be active for at least 30 minutes on most or all days.  Do not smoke. Try not to be around others who smoke.  Do not drink too much alcohol. ? Do not have more than 2 drinks a day if you are a man. ? Do not have more than 1 drink a day if you  are a woman and are not pregnant.  Eat healthy foods, such as fruits and vegetables. If you were put on a specific diet, follow the diet as told.  Keep your cholesterol levels under control through diet and medicines. Look for foods that are low in saturated fat, trans fat, cholesterol, and are high in fiber.  If you have diabetes, follow all diet plans and take your medicine as told.  Ask your doctor if you need treatment to lower your blood pressure. If you have high blood pressure (hypertension), follow all diet plans and take your medicine as told by your doctor.  If you are 18-39 years old, have your blood pressure checked every 3-5 years. If you are age 40 or older, have your blood pressure checked every year.  Keep a healthy weight. Eat foods that are low in calories, salt, saturated fat, trans fat, and cholesterol.  Do not take drugs.  Avoid birth control pills, if this applies. Talk to your doctor about the risks of taking birth control pills.  Talk to your doctor if you have sleep problems (sleep apnea).  Take all medicine as told by your doctor. ? You may be told to take aspirin or blood thinner medicine. Take this medicine as told by your doctor. ? Understand your medicine instructions.  Make sure any other conditions you have are being taken care of.  Get help right away if:  You suddenly lose feeling (you feel numb) or have weakness in your face, arm, or leg.  Your face or eyelid hangs down to one side.  You suddenly feel confused.  You have trouble talking (aphasia) or understanding what people are saying.  You suddenly have trouble seeing in one or both eyes.  You suddenly have trouble walking.  You are dizzy.  You lose your balance or your movements are clumsy (uncoordinated).  You suddenly have a very bad headache and you do not know the cause.  You have new chest pain.  Your heart feels like it is fluttering or skipping a beat (irregular  heartbeat). Do not wait to see if the symptoms above go away. Get help right away. Call your local emergency services (911 in [place] Do not drive yourself to the hospital. This information is not intended to replace advice given to you by your health care provider. Make sure you discuss any questions you have with your health care provider. Document Released: 11/05/2011 Document Revised: 10/12/2015 Document Reviewed: 11/06/2012 Elsevier Interactive Patient Education  2018 Elsevier Inc.  

## 2017-06-02 NOTE — Progress Notes (Signed)
Chief Complaint: Follow up Extracranial Carotid Artery Stenosis   History of Present Illness  [name] is a 77 y.o. male who is status post right carotid endarterectomy on 10/01/2013 by Dr. Donnetta Hutching for severe (80% stenosis) asymptomatic disease.  He returns today for follow up.  The patient denies any history of TIA or stroke symptoms, specifically he denies a history of amaurosis fugax or monocular blindness, unilateral facial drooping,  hemiplegia, or receptive or expressive aphasia.   The patient states he has "a pinched nerve" in his back and has pain in both of his hips from this, sees [facility] for this. Pt states his right knee was surgically replaced in 2016 and left was replaced in 2012.  His right shoulder sustained an injury in the past and he had a right shoulder replacement on 10-11-16.  He is physically active, exercises 3 days/week, walks his dog daily about 30 minutes. He scuba dives.  He states that his heart rate has alwyas been fast.   Pt Diabetic: No Pt smoker: smoker (couple of cigars daily, started smoking in his 20's,states he has no intention of quitting) Pt states he consumes about 14 ETOH drinks/week  He is trying to lose weight, states at his heaviest he was 240 pounds.   Pt meds include: Statin : Yes ASA: no Other anticoagulants/antiplatelets: Yes, had a DVT in his left leg in 2016, about 4 months after right knee replaced, which resulted in a PE, takes Eliquis   Past Medical History:  Diagnosis Date  . Anxiety    reports scotch helps that   . Arthritis    " it's everywhere"  . Blood transfusion without reported diagnosis   . Cataract    having cataract surgery July 2016  . Chronic kidney disease    hx kidney stones  . Colon polyps   . Complication of anesthesia    after surgery 07-2013 unable to sleep for 3 days  . GERD (gastroesophageal reflux disease)   . History of kidney stones   . Hyperlipidemia   .  Hypertension   . Prostate hypertrophy   . Pulmonary embolism ([facility]) 12/2014   post TJR  . Seasonal allergies   . 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 Sleep apnea    had surgery in 2000 for sleep apnea, no CPAP needed    Social History Social History   Tobacco Use  . Smoking status: Light Tobacco Smoker    Years: 50.00    Types: Cigars  . Smokeless tobacco: Never Used  . Tobacco comment: pt states he smokes a couple of cigars per day. Never smoke cigarettes  Substance Use Topics  . Alcohol use: Yes    Alcohol/week: 12.6 oz    Types: 7 Glasses of wine, 14 Shots of liquor per week  . Drug use: No    Family History Family History  Problem Relation Age of Onset  . Diverticulosis Mother   . Skin cancer Mother   . Varicose Veins Mother   . Hyperlipidemia Father   . Heart disease Father   . Heart attack Father   . Colon cancer Neg Hx   . Esophageal cancer Neg Hx   . Rectal cancer Neg Hx   . Stomach cancer Neg Hx     Surgical History Past Surgical History:  Procedure Laterality Date  . CAROTID ENDARTERECTOMY Right Oct 01, 2013  CE  . COLONOSCOPY    . ENDARTERECTOMY Right 10/01/2013   Procedure: ENDARTERECTOMY CAROTID-RIGHT;  Surgeon: Rosetta Posner, MD;  Location: [facility];  Service: Vascular;  Laterality: Right;  . ESOPHAGOGASTRODUODENOSCOPY ENDOSCOPY  10-12-2013  . EXCISION ORAL LESION WITH CO2 LASER N/A 09/08/2015   Procedure: EXCISION ORAL LESION WITH CO2 LASER;  Surgeon: Jerrell Belfast, MD;  Location: [facility];  Service: ENT;  Laterality: N/A;  . EYE SURGERY Bilateral    cataracts, W/IOL  . JOINT REPLACEMENT Right September 02, 2014   Knee  . JOINT REPLACEMENT Left 2012   Knee  . PATCH ANGIOPLASTY Right 10/01/2013   Procedure: PATCH ANGIOPLASTY USING HEMASHIELD FINESSE PATCH;  Surgeon: Rosetta Posner, MD;  Location: [facility];  Service: Vascular;  Laterality: Right;  . removal of melonomia Left     shoulder  . REPLACEMENT TOTAL KNEE Left   . REVERSE SHOULDER ARTHROPLASTY Right 10/11/2016   Procedure: REVERSE RIGHT SHOULDER ARTHROPLASTY;  Surgeon: Netta Cedars, MD;  Location: [facility];  Service: Orthopedics;  Laterality: Right;  . RHINOPLASTY     1999  . RHINOPLASTY    . SKIN CANCER EXCISION     squamous cell cancer under tongue  . SKIN SURGERY     squamous cell carcinoma removed in various places,melanoma removed from L shoulder  . sleep apnea surgery  2000  . STERIOD INJECTION Left 10/11/2016   Procedure: STEROID INJECTION LEFT SHOULDER;  Surgeon: Netta Cedars, MD;  Location: [facility];  Service: Orthopedics;  Laterality: Left;  . TOTAL KNEE ARTHROPLASTY Right 09/02/2014   Procedure: RIGHT TOTAL KNEE ARTHROPLASTY;  Surgeon: Netta Cedars, MD;  Location: [facility];  Service: Orthopedics;  Laterality: Right;    Allergies  Allergen Reactions  . Cefaclor Other (See Comments)     skin split  . Penicillins Rash    Has patient had a PCN reaction causing immediate rash, facial/tongue/throat swelling, SOB or lightheadedness with hypotension:unsure Has patient had a PCN reaction causing severe rash involving mucus membranes or skin necrosis:unsure Has patient had a PCN reaction that required hospitalization:No Has patient had a PCN reaction occurring within the last 10 years:No--childhood allergy If all of the above answers are "NO", then may proceed with Cephalosporin use.     Current Outpatient Medications  Medication Sig Dispense Refill  . acetaminophen (TYLENOL) 650 MG CR tablet Take 650 mg by mouth every 8 (eight) hours as needed for pain.    Marland Kitchen apixaban (ELIQUIS) 5 MG TABS tablet Take 10 mg by mouth daily.     . B Complex-C (B-COMPLEX WITH VITAMIN C) tablet Take 1 tablet by mouth daily.    . Cholecalciferol ([employer] VITAMIN D3) 10000 units CAPS Take 10,000 Units by mouth daily.    Marland Kitchen esomeprazole (NEXIUM) 40 MG capsule Take 40 mg by mouth daily as needed (acid reflux).     . fenofibrate  (TRICOR) 145 MG tablet Take 145 mg by mouth daily before breakfast.     . fluticasone (FLONASE) 50 MCG/ACT nasal spray Place 2 sprays into both nostrils daily as needed for allergies or rhinitis.    . Fluticasone-Salmeterol (ADVAIR) 100-50 MCG/DOSE AEPB Inhale 1 puff into the lungs 2 (two) times daily as needed (for respiratory issues.).    Marland Kitchen losartan-hydrochlorothiazide (HYZAAR) 100-12.5 MG tablet Take 1 tablet by mouth daily.    . methocarbamol (ROBAXIN) 500 MG tablet Take 1 tablet (500 mg total) by mouth 3 (three) times daily as needed. 60 tablet 1  . predniSONE (STERAPRED UNI-PAK 48  TAB) 10 MG (48) TBPK tablet Take by mouth daily. Pt stated he started taking 01/28/17.    . probenecid (BENEMID) 500 MG tablet Take 500 mg by mouth daily before breakfast.     . rosuvastatin (CRESTOR) 10 MG tablet Take 10 mg by mouth daily.    . tamsulosin (FLOMAX) 0.4 MG CAPS capsule Take 0.4 mg by mouth.     No current facility-administered medications for this visit.     Review of Systems : See HPI for pertinent positives and negatives.  Physical Examination  Vitals:   06/02/17 0949 06/02/17 0952  BP: 105/75 102/70  Pulse: (!) 101   Resp: 20   Temp: 98.8 F (37.1 C)   TempSrc: Oral   SpO2: 90%   Weight: 206 lb (93.4 kg)   Height: 5\' 10"  (1.778 m)    Body mass index is 29.56 kg/m.  General: WDWN male in NAD GAIT:normal Eyes: PERRLA Pulmonary: Respirations are non-labored, CTAB, fair air movement in all fields.  Cardiac: regular rhythm, no detected murmur.  VASCULAR EXAM Carotid Bruits Right Left   Negative Negative    Abdominal aortic pulse is not palpable. Radial pulses: right is 2+ palpable, left is faintly palpable but left brachial is 2+ palpable.      LE Pulses Right Left   POPLITEAL not palpable  not palpable   POSTERIOR  TIBIAL not palpable   palpable    DORSALIS PEDIS  ANTERIOR TIBIAL palpable  palpable     Gastrointestinal: soft, nontender, BS WNL, no r/g, no palpable masses.  Musculoskeletal: No muscle atrophy/wasting. M/S 5/5 throughout, extremities without ischemic changes.  Neurologic: A&O X 3; appropriate affect, speech is normal CN 2-12 intact, pain and light touch intact in extremities, motor exam as listed above.       Psychiatric: Normal thought content, mood appropriate to clinical situation.       Assessment: [name] is a 77 y.o. male who is status post right carotid endarterectomy on 10/01/2013 for severe (80% stenosis) asymptomatic disease. He has no history of stroke or TIA.  DATA  Carotid Duplex (06/02/17): Patent right carotid endarterectomy site with no right internal carotid artery stenosis and 1-39% stenosis of the left proximal internal carotid artery. Bilateral vertebral artery flow is antegrade.  Bilateral subclavian artery waveforms are normal.  No significant change compared to the exams on 05-08-15 and 05-28-16.    Plan: The patient was counseled re smoking cessation and offered several free resources re smoking cessation but he declined.  Follow-up in 1 year with Carotid Duplex scan.   I discussed in depth with the patient the nature of atherosclerosis, and emphasized the importance of maximal medical management including strict control of blood pressure, blood glucose, and lipid levels, obtaining regular exercise, and cessation of smoking.  The patient is aware that without maximal medical management the underlying atherosclerotic disease process will progress, limiting the benefit of any interventions. The patient was given information about stroke prevention and what symptoms should prompt the patient to seek immediate medical care. Thank you for allowing [place] to participate in this patient's care.  Clemon Chambers, RN, MSN,  FNP-C [facility] Office: [facility] Physician: Trula Slade  06/02/17 10:10 AM

## 2017-06-04 LAB — VAS US CAROTID
LEFT ECA DIAS: -5 cm/s
LICADSYS: -41 cm/s
LICAPSYS: -88 cm/s
Left CCA dist dias: 11 cm/s
Left CCA dist sys: 48 cm/s
Left CCA prox dias: 14 cm/s
Left CCA prox sys: 90 cm/s
Left ICA dist dias: -16 cm/s
Left ICA prox dias: -26 cm/s
RCCAPDIAS: 18 cm/s
RIGHT CCA MID DIAS: 16 cm/s
RIGHT ECA DIAS: -13 cm/s
Right CCA prox sys: 87 cm/s
Right cca dist sys: -47 cm/s

## 2017-06-17 DIAGNOSIS — M542 Cervicalgia: Secondary | ICD-10-CM | POA: Insufficient documentation

## 2017-06-17 DIAGNOSIS — M1612 Unilateral primary osteoarthritis, left hip: Secondary | ICD-10-CM | POA: Diagnosis not present

## 2017-06-19 ENCOUNTER — Telehealth: Payer: Self-pay

## 2017-06-19 NOTE — Telephone Encounter (Signed)
   [facility] Pre-operative Risk Assessment    Request for surgical clearance:  1. What type of surgery is being performed? Left Hip THA w/wo autograft/allograft   2. When is this surgery scheduled? 10/15/2017   3. What type of clearance is required (medical clearance vs. Pharmacy clearance to hold med vs. Both)? both  4. Are there any medications that need to be held prior to surgery and how long?unknown   5. Practice name and name of physician performing surgery? [facility]/ frank aluisio   6. What is your office phone and fax number? [phone]/ [phone]   7. Anesthesia type (None, local, MAC, general) ? unknown   [name] 06/19/2017, 3:23 PM  _________________________________________________________________   (provider comments below)

## 2017-06-20 NOTE — Telephone Encounter (Signed)
Spoke with surgical coordinator to verify surgery date and she verified that it is the end of May. I explained to her that we had never seen the patient and she stated the patient had said we were his heart doctor and Dr. Chase Caller" was his doctor. I said OK. I would look into it further.  Called surgical coordinator back to let her know that Dr. Chase Caller is a lung doctor. We have never seen this patient. If she would like for him to be seen, if she could please send [place] a new patient referral and we can get him scheduled for a new patient appointment. Left number for our office. Waiting on call back.

## 2017-06-20 NOTE — Telephone Encounter (Signed)
   Primary Cardiologist: None  Chart reviewed as part of pre-operative protocol coverage.  He will need new provider appointment for clearance.   Pre-op covering staff: - Please schedule appointment and call patient to inform them. - Please contact requesting surgeon's office via preferred method (i.e, phone, fax) to inform them of need for appointment prior to surgery. Also confirm the date of surgery.   [place], [place]  06/20/2017, 2:45 PM

## 2017-06-26 NOTE — Telephone Encounter (Signed)
Encounter will be closed. Will wait on new patient referral for clearance appointment

## 2017-07-16 DIAGNOSIS — N401 Enlarged prostate with lower urinary tract symptoms: Secondary | ICD-10-CM | POA: Diagnosis not present

## 2017-07-23 DIAGNOSIS — N401 Enlarged prostate with lower urinary tract symptoms: Secondary | ICD-10-CM | POA: Diagnosis not present

## 2017-07-23 DIAGNOSIS — N5201 Erectile dysfunction due to arterial insufficiency: Secondary | ICD-10-CM | POA: Diagnosis not present

## 2017-07-23 DIAGNOSIS — R3912 Poor urinary stream: Secondary | ICD-10-CM | POA: Diagnosis not present

## 2017-07-30 DIAGNOSIS — D225 Melanocytic nevi of trunk: Secondary | ICD-10-CM | POA: Diagnosis not present

## 2017-07-30 DIAGNOSIS — L821 Other seborrheic keratosis: Secondary | ICD-10-CM | POA: Diagnosis not present

## 2017-07-30 DIAGNOSIS — L570 Actinic keratosis: Secondary | ICD-10-CM | POA: Diagnosis not present

## 2017-08-12 DIAGNOSIS — C069 Malignant neoplasm of mouth, unspecified: Secondary | ICD-10-CM | POA: Diagnosis not present

## 2017-08-28 ENCOUNTER — Encounter: Payer: Self-pay | Admitting: Gastroenterology

## 2017-09-02 DIAGNOSIS — L570 Actinic keratosis: Secondary | ICD-10-CM | POA: Diagnosis not present

## 2017-09-15 DIAGNOSIS — R82998 Other abnormal findings in urine: Secondary | ICD-10-CM | POA: Diagnosis not present

## 2017-09-15 DIAGNOSIS — M859 Disorder of bone density and structure, unspecified: Secondary | ICD-10-CM | POA: Diagnosis not present

## 2017-09-15 DIAGNOSIS — I10 Essential (primary) hypertension: Secondary | ICD-10-CM | POA: Diagnosis not present

## 2017-09-15 DIAGNOSIS — M109 Gout, unspecified: Secondary | ICD-10-CM | POA: Diagnosis not present

## 2017-09-15 DIAGNOSIS — R7309 Other abnormal glucose: Secondary | ICD-10-CM | POA: Diagnosis not present

## 2017-09-15 DIAGNOSIS — E7849 Other hyperlipidemia: Secondary | ICD-10-CM | POA: Diagnosis not present

## 2017-09-15 DIAGNOSIS — Z125 Encounter for screening for malignant neoplasm of prostate: Secondary | ICD-10-CM | POA: Diagnosis not present

## 2017-09-15 DIAGNOSIS — E291 Testicular hypofunction: Secondary | ICD-10-CM | POA: Diagnosis not present

## 2017-09-18 DIAGNOSIS — M1612 Unilateral primary osteoarthritis, left hip: Secondary | ICD-10-CM | POA: Diagnosis not present

## 2017-09-22 DIAGNOSIS — Z86711 Personal history of pulmonary embolism: Secondary | ICD-10-CM | POA: Diagnosis not present

## 2017-09-22 DIAGNOSIS — I6521 Occlusion and stenosis of right carotid artery: Secondary | ICD-10-CM | POA: Diagnosis not present

## 2017-09-22 DIAGNOSIS — R7309 Other abnormal glucose: Secondary | ICD-10-CM | POA: Diagnosis not present

## 2017-09-22 DIAGNOSIS — N183 Chronic kidney disease, stage 3 (moderate): Secondary | ICD-10-CM | POA: Diagnosis not present

## 2017-09-22 DIAGNOSIS — J479 Bronchiectasis, uncomplicated: Secondary | ICD-10-CM | POA: Diagnosis not present

## 2017-09-22 DIAGNOSIS — Z1389 Encounter for screening for other disorder: Secondary | ICD-10-CM | POA: Diagnosis not present

## 2017-09-22 DIAGNOSIS — Z0000 Encounter for general adult medical examination without abnormal findings: Secondary | ICD-10-CM | POA: Diagnosis not present

## 2017-09-22 DIAGNOSIS — Z6830 Body mass index (BMI) 30.0-30.9, adult: Secondary | ICD-10-CM | POA: Diagnosis not present

## 2017-09-22 DIAGNOSIS — Z7901 Long term (current) use of anticoagulants: Secondary | ICD-10-CM | POA: Diagnosis not present

## 2017-09-22 DIAGNOSIS — I700 Atherosclerosis of aorta: Secondary | ICD-10-CM | POA: Diagnosis not present

## 2017-09-22 DIAGNOSIS — I1310 Hypertensive heart and chronic kidney disease without heart failure, with stage 1 through stage 4 chronic kidney disease, or unspecified chronic kidney disease: Secondary | ICD-10-CM | POA: Diagnosis not present

## 2017-09-22 NOTE — Patient Instructions (Addendum)
[name]  09/22/2017   Your procedure is scheduled on:  Wednesday 10/01/2017  Report to [facility] Main  Entrance               Report to admitting at  [place]  AM    Call this number if you have problems the morning of surgery [phone]    Remember: Do not eat food or drink liquids :After Midnight.     Take these medicines the morning of surgery with A SIP OF WATER: Rosuvastatin (Lipitor),    Fenofibrate (Tricor), Tamulosin (Flomax),use Advair inhaler if needed and bring it with you to the hospital.                                You may not have any metal on your body including hair pins and              piercings  Do not wear jewelry, make-up, lotions, powders or perfumes, deodorant             Men may shave face and neck.   Do not bring valuables to the hospital. [name].  Contacts, dentures or bridgework may not be worn into surgery.  Leave suitcase in the car. After surgery it may be brought to your room.                  Please read over the following fact sheets you were given: _____________________________________________________________________             [facility] - Preparing for Surgery Before surgery, you can play an important role.  Because skin is not sterile, your skin needs to be as free of germs as possible.  You can reduce the number of germs on your skin by washing with CHG (chlorahexidine gluconate) soap before surgery.  CHG is an antiseptic cleaner which kills germs and bonds with the skin to continue killing germs even after washing. Please DO NOT use if you have an allergy to CHG or antibacterial soaps.  If your skin becomes reddened/irritated stop using the CHG and inform your nurse when you arrive at Short Stay. Do not shave (including legs and underarms) for at least 48 hours prior to the first CHG shower.  You may shave your face/neck. Please follow these instructions  carefully:  1.  Shower with CHG Soap the night before surgery and the  morning of Surgery.  2.  If you choose to wash your hair, wash your hair first as usual with your  normal  shampoo.  3.  After you shampoo, rinse your hair and body thoroughly to remove the  shampoo.                           4.  Use CHG as you would any other liquid soap.  You can apply chg directly  to the skin and wash                       Gently with a scrungie or clean washcloth.  5.  Apply the CHG Soap to your body ONLY FROM THE NECK DOWN.   Do not use on face/  open                           Wound or open sores. Avoid contact with eyes, ears mouth and genitals (private parts).                       Wash face,  Genitals (private parts) with your normal soap.             6.  Wash thoroughly, paying special attention to the area where your surgery  will be performed.  7.  Thoroughly rinse your body with warm water from the neck down.  8.  DO NOT shower/wash with your normal soap after using and rinsing off  the CHG Soap.                9.  Pat yourself dry with a clean towel.            10.  Wear clean pajamas.            11.  Place clean sheets on your bed the night of your first shower and do not  sleep with pets. Day of Surgery : Do not apply any lotions/deodorants the morning of surgery.  Please wear clean clothes to the hospital/surgery center.  FAILURE TO FOLLOW THESE INSTRUCTIONS MAY RESULT IN THE CANCELLATION OF YOUR SURGERY PATIENT SIGNATURE_________________________________  NURSE SIGNATURE__________________________________  ________________________________________________________________________   [name]  An incentive spirometer is a tool that can help keep your lungs clear and active. This tool measures how well you are filling your lungs with each breath. Taking long deep breaths may help reverse or decrease the chance of developing breathing (pulmonary) problems (especially infection)  following:  A long period of time when you are unable to move or be active. BEFORE THE PROCEDURE   If the spirometer includes an indicator to show your best effort, your nurse or respiratory therapist will set it to a desired goal.  If possible, sit up straight or lean slightly forward. Try not to slouch.  Hold the incentive spirometer in an upright position. INSTRUCTIONS FOR USE  1. Sit on the edge of your bed if possible, or sit up as far as you can in bed or on a chair. 2. Hold the incentive spirometer in an upright position. 3. Breathe out normally. 4. Place the mouthpiece in your mouth and seal your lips tightly around it. 5. Breathe in slowly and as deeply as possible, raising the piston or the ball toward the top of the column. 6. Hold your breath for 3-5 seconds or for as long as possible. Allow the piston or ball to fall to the bottom of the column. 7. Remove the mouthpiece from your mouth and breathe out normally. 8. Rest for a few seconds and repeat Steps 1 through 7 at least 10 times every 1-2 hours when you are awake. Take your time and take a few normal breaths between deep breaths. 9. The spirometer may include an indicator to show your best effort. Use the indicator as a goal to work toward during each repetition. 10. After each set of 10 deep breaths, practice coughing to be sure your lungs are clear. If you have an incision (the cut made at the time of surgery), support your incision when coughing by placing a pillow or rolled up towels firmly against it. Once you are able to get out of bed, walk around indoors and  cough well. You may stop using the incentive spirometer when instructed by your caregiver.  RISKS AND COMPLICATIONS  Take your time so you do not get dizzy or light-headed.  If you are in pain, you may need to take or ask for pain medication before doing incentive spirometry. It is harder to take a deep breath if you are having pain. AFTER USE  Rest and  breathe slowly and easily.  It can be helpful to keep track of a log of your progress. Your caregiver can provide you with a simple table to help with this. If you are using the spirometer at home, follow these instructions: [place] IF:   You are having difficultly using the spirometer.  You have trouble using the spirometer as often as instructed.  Your pain medication is not giving enough relief while using the spirometer.  You develop fever of 100.5 F (38.1 C) or higher. SEEK IMMEDIATE MEDICAL CARE IF:   You cough up bloody sputum that had not been present before.  You develop fever of 102 F (38.9 C) or greater.  You develop worsening pain at or near the incision site. MAKE SURE YOU:   Understand these instructions.  Will watch your condition.  Will get help right away if you are not doing well or get worse. Document Released: 09/16/2006 Document Revised: 07/29/2011 Document Reviewed: 11/17/2006 ExitCare Patient Information 2014 ExitCare, [place].   ________________________________________________________________________  WHAT IS A BLOOD TRANSFUSION? Blood Transfusion Information  A transfusion is the replacement of blood or some of its parts. Blood is made up of multiple cells which provide different functions.  Red blood cells carry oxygen and are used for blood loss replacement.  White blood cells fight against infection.  Platelets control bleeding.  Plasma helps clot blood.  Other blood products are available for specialized needs, such as hemophilia or other clotting disorders. BEFORE THE TRANSFUSION  Who gives blood for transfusions?   Healthy volunteers who are fully evaluated to make sure their blood is safe. This is blood bank blood. Transfusion therapy is the safest it has ever been in the practice of medicine. Before blood is taken from a donor, a complete history is taken to make sure that person has no history of diseases nor engages in  risky social behavior (examples are intravenous drug use or sexual activity with multiple partners). The donor's travel history is screened to minimize risk of transmitting infections, such as malaria. The donated blood is tested for signs of infectious diseases, such as HIV and hepatitis. The blood is then tested to be sure it is compatible with you in order to minimize the chance of a transfusion reaction. If you or a relative donates blood, this is often done in anticipation of surgery and is not appropriate for emergency situations. It takes many days to process the donated blood. RISKS AND COMPLICATIONS Although transfusion therapy is very safe and saves many lives, the main dangers of transfusion include:   Getting an infectious disease.  Developing a transfusion reaction. This is an allergic reaction to something in the blood you were given. Every precaution is taken to prevent this. The decision to have a blood transfusion has been considered carefully by your caregiver before blood is given. Blood is not given unless the benefits outweigh the risks. AFTER THE TRANSFUSION  Right after receiving a blood transfusion, you will usually feel much better and more energetic. This is especially true if your red blood cells have gotten low (anemic).  The transfusion raises the level of the red blood cells which carry oxygen, and this usually causes an energy increase.  The nurse administering the transfusion will monitor you carefully for complications. HOME CARE INSTRUCTIONS  No special instructions are needed after a transfusion. You may find your energy is better. Speak with your caregiver about any limitations on activity for underlying diseases you may have. SEEK MEDICAL CARE IF:   Your condition is not improving after your transfusion.  You develop redness or irritation at the intravenous (IV) site. SEEK IMMEDIATE MEDICAL CARE IF:  Any of the following symptoms occur over the next 12  hours:  Shaking chills.  You have a temperature by mouth above 102 F (38.9 C), not controlled by medicine.  Chest, back, or muscle pain.  People around you feel you are not acting correctly or are confused.  Shortness of breath or difficulty breathing.  Dizziness and fainting.  You get a rash or develop hives.  You have a decrease in urine output.  Your urine turns a dark color or changes to pink, red, or brown. Any of the following symptoms occur over the next 10 days:  You have a temperature by mouth above 102 F (38.9 C), not controlled by medicine.  Shortness of breath.  Weakness after normal activity.  The white part of the eye turns yellow (jaundice).  You have a decrease in the amount of urine or are urinating less often.  Your urine turns a dark color or changes to pink, red, or brown. Document Released: 05/03/2000 Document Revised: 07/29/2011 Document Reviewed: 12/21/2007 [facility] Patient Information 2014 [place], [place].  _______________________________________________________________________

## 2017-09-22 NOTE — Progress Notes (Signed)
09/15/2017- labs from Dr. Virgina Jock on chart-Testosterone, Free and Total, U/A routine and microscopic, CMP,CBC,Lipid,Uric Acid, Vitamin D, Apolipoprotein, TSH,PSA,HgA1C, Microalbumin/creatinine  06/18/2017- Clearance from Dr. Virgina Jock on chart.  03/11/2017- Office visit from Dr. Virgina Jock on chart.  02/13/2017- Office visit from Dr. Virgina Jock on chart.  06/17/2016- Office visit from Dr. Einar Gip on chart and EKG  06/03/2014- Stress and ECHO  on chart from Dr. Einar Gip

## 2017-09-24 NOTE — Progress Notes (Signed)
Consulted Dr. Andres Shad, [facility] face to face about patient's medical history, last EKG 06/17/2016 from Dr. Einar Gip and Last office visit from Dr. Einar Gip on 06/17/2016. Per Dr. Valma Cava, patient needs a Cardiac Clearance from Dr. Einar Gip .  LM on V[employer] fro [employer], scheduler for Dr. Wynelle Link for her to call me back.

## 2017-09-25 ENCOUNTER — Other Ambulatory Visit: Payer: Self-pay

## 2017-09-25 ENCOUNTER — Encounter (HOSPITAL_COMMUNITY): Payer: Self-pay

## 2017-09-25 ENCOUNTER — Encounter (HOSPITAL_COMMUNITY)
Admission: RE | Admit: 2017-09-25 | Discharge: 2017-09-25 | Disposition: A | Discharge status: Home / Self Care | Payer: Medicare Other | Source: Ambulatory Visit | Attending: Orthopedic Surgery | Admitting: Orthopedic Surgery

## 2017-09-25 DIAGNOSIS — I10 Essential (primary) hypertension: Secondary | ICD-10-CM | POA: Insufficient documentation

## 2017-09-25 DIAGNOSIS — Z01812 Encounter for preprocedural laboratory examination: Secondary | ICD-10-CM | POA: Diagnosis not present

## 2017-09-25 DIAGNOSIS — M1612 Unilateral primary osteoarthritis, left hip: Secondary | ICD-10-CM | POA: Insufficient documentation

## 2017-09-25 DIAGNOSIS — Z01810 Encounter for preprocedural cardiovascular examination: Secondary | ICD-10-CM | POA: Insufficient documentation

## 2017-09-25 LAB — APTT: aPTT: 32 seconds (ref 24–36)

## 2017-09-25 LAB — PROTIME-INR
INR: 1.38
Prothrombin Time: 16.8 seconds — ABNORMAL HIGH (ref 11.4–15.2)

## 2017-09-25 LAB — SURGICAL PCR SCREEN
MRSA, PCR: NEGATIVE
Staphylococcus aureus: POSITIVE — AB

## 2017-09-25 LAB — ABO/RH: ABO/RH(D): AB POS

## 2017-09-25 NOTE — Progress Notes (Signed)
Patient came to Pre-op appointment and patient stated he only saw Dr. Einar Gip back in 05/2016 as recommended from Dr. Wynelle Link to get a Stress test and EKG for his surgery in 2018. Patient states did not need to follow up with Dr. Einar Gip as Dr. Virgina Jock prescribes his Eliquis for history of Pulmonary Embolism.

## 2017-09-25 NOTE — Progress Notes (Signed)
09/22/2017-Pre-op exam office note  for upcoming surgery on 10/01/2017 by Dr. Wynelle Link on chart from Dr. Virgina Jock .

## 2017-09-28 NOTE — H&P (Signed)
TOTAL HIP ADMISSION H&P  Patient is admitted for left total hip arthroplasty.  Subjective:  Chief Complaint: left hip pain  HPI: [name], 77 y.o. male, has a history of pain and functional disability in the left hip(s) due to arthritis and patient has failed non-surgical conservative treatments for greater than 12 weeks to include NSAID's and/or analgesics, corticosteriod injections, flexibility and strengthening excercises and activity modification.  Onset of symptoms was gradual starting 3 years ago with gradually worsening course since that time.The patient noted no past surgery on the left hip(s).  Patient currently rates pain in the left hip at 8 out of 10 with activity. Patient has night pain, worsening of pain with activity and weight bearing, pain that interfers with activities of daily living and pain with passive range of motion. Patient has evidence of subchondral cysts, periarticular osteophytes and joint space narrowing by imaging studies. This condition presents safety issues increasing the risk of falls.  There is no current active infection.  Patient Active Problem List   Diagnosis Date Noted  . Bibasilar crackles 02/03/2017  . S/P shoulder replacement, right 10/11/2016  . Smoking greater than 40 pack years 06/18/2016  . Screening for cancer 06/18/2016  . Chronic anticoagulation 03/13/2016  . Oral mucosal lesion 09/08/2015  . History of pulmonary embolism 07/20/2015  . History of DVT of lower extremity 07/20/2015  . History of right-sided carotid endarterectomy 05/12/2015  . [place] pulmonary embolus ([place]) 01/04/2015  . S/P TKR (total knee replacement) using cement 09/02/2014  . Rectal bleeding 07/20/2014  . History of colon polyps 07/20/2014  . Carotid artery stenosis 10/01/2013  . Nonspecific abnormal finding in stool contents 09/28/2013  . Occlusion and stenosis of carotid artery without mention of cerebral infarction 09/14/2013  . Hx of adenomatous colonic polyps  05/08/2011  . Diverticulosis 05/08/2011  . HTN (hypertension) 05/08/2011  . Gout 05/08/2011  . S/P total knee replacement 05/08/2011   Past Medical History:  Diagnosis Date  . Anxiety    reports scotch helps that   . Arthritis    " it's everywhere"  . Blood transfusion without reported diagnosis   . Cataract    having cataract surgery July 2016  . Chronic kidney disease    hx kidney stones  . Colon polyps   . Complication of anesthesia    after surgery 07-2013 unable to sleep for 3 days  . GERD (gastroesophageal reflux disease)   . History of kidney stones   . Hyperlipidemia   . Hypertension   . Prostate hypertrophy   . Pulmonary embolism ([place]) 12/2014   post TJR  . Seasonal allergies   . 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 Sleep apnea    had surgery in 2000 for sleep apnea, no CPAP needed    Past Surgical History:  Procedure Laterality Date  . CAROTID ENDARTERECTOMY Right Oct 01, 2013   CE  . COLONOSCOPY    . ENDARTERECTOMY Right 10/01/2013   Procedure: ENDARTERECTOMY CAROTID-RIGHT;  Surgeon: Rosetta Posner, MD;  Location: [employer];  Service: Vascular;  Laterality: Right;  . ESOPHAGOGASTRODUODENOSCOPY ENDOSCOPY  10-12-2013  . EXCISION ORAL LESION WITH CO2 LASER N/A 09/08/2015   Procedure: EXCISION ORAL LESION WITH CO2 LASER;  Surgeon: Jerrell Belfast, MD;  Location: [place];  Service: ENT;  Laterality: N/A;  . EYE SURGERY Bilateral    cataracts, W/IOL  . JOINT REPLACEMENT  Right September 02, 2014   Knee  . JOINT REPLACEMENT Left 2012   Knee  . PATCH ANGIOPLASTY Right 10/01/2013   Procedure: PATCH ANGIOPLASTY USING HEMASHIELD FINESSE PATCH;  Surgeon: Rosetta Posner, MD;  Location: [place];  Service: Vascular;  Laterality: Right;  . removal of melonomia Left    shoulder  . REPLACEMENT TOTAL KNEE Left   . REVERSE SHOULDER ARTHROPLASTY Right 10/11/2016   Procedure: REVERSE RIGHT SHOULDER ARTHROPLASTY;   Surgeon: Netta Cedars, MD;  Location: [place];  Service: Orthopedics;  Laterality: Right;  . RHINOPLASTY     1999  . RHINOPLASTY    . SKIN CANCER EXCISION     squamous cell cancer under tongue  . SKIN SURGERY     squamous cell carcinoma removed in various places,melanoma removed from L shoulder  . sleep apnea surgery  2000  . STERIOD INJECTION Left 10/11/2016   Procedure: STEROID INJECTION LEFT SHOULDER;  Surgeon: Netta Cedars, MD;  Location: [place];  Service: Orthopedics;  Laterality: Left;  . TOTAL KNEE ARTHROPLASTY Right 09/02/2014   Procedure: RIGHT TOTAL KNEE ARTHROPLASTY;  Surgeon: Netta Cedars, MD;  Location: [place];  Service: Orthopedics;  Laterality: Right;     Current Outpatient Medications  Medication Sig Dispense Refill Last Dose  . acetaminophen (TYLENOL) 650 MG CR tablet Take 1,300 mg by mouth daily.    Taking  . apixaban (ELIQUIS) 5 MG TABS tablet Take 5 mg by mouth 2 (two) times daily.    Taking  . B Complex-C (B-COMPLEX WITH VITAMIN C) tablet Take 1 tablet by mouth daily.   Taking  . cholecalciferol (VITAMIN D) 1000 units tablet Take 1,000 Units by mouth daily.     Marland Kitchen esomeprazole (NEXIUM) 40 MG capsule Take 40 mg by mouth daily as needed (acid reflux).    Taking  . fenofibrate (TRICOR) 145 MG tablet Take 145 mg by mouth daily before breakfast.    Taking  . fluticasone (FLONASE) 50 MCG/ACT nasal spray Place 2 sprays into both nostrils daily as needed for allergies or rhinitis.   Taking  . Fluticasone-Salmeterol (ADVAIR) 100-50 MCG/DOSE AEPB Inhale 1 puff into the lungs 2 (two) times daily as needed (for respiratory issues.).   Taking  . losartan-hydrochlorothiazide (HYZAAR) 100-12.5 MG tablet Take 1 tablet by mouth daily.   Taking  . probenecid (BENEMID) 500 MG tablet Take 500 mg by mouth daily before breakfast.    Taking  . rosuvastatin (CRESTOR) 10 MG tablet Take 10 mg by mouth daily.   Taking  . tamsulosin (FLOMAX) 0.4 MG CAPS capsule Take 0.4 mg by mouth daily.    Taking    Allergies  Allergen Reactions  . Cefaclor Other (See Comments)     skin split  . Penicillins Rash    Has patient had a PCN reaction causing immediate rash, facial/tongue/throat swelling, SOB or lightheadedness with hypotension:unsure Has patient had a PCN reaction causing severe rash involving mucus membranes or skin necrosis:unsure Has patient had a PCN reaction that required hospitalization:No Has patient had a PCN reaction occurring within the last 10 years:No--childhood allergy If all of the above answers are "NO", then may proceed with Cephalosporin use.     Social History   Tobacco Use  . Smoking status: Light Tobacco Smoker    Years: 50.00    Types: Cigars  . Smokeless tobacco: Never Used  . Tobacco comment: pt states he smokes a couple of cigars per day. Never smoke cigarettes  Substance Use Topics  . Alcohol  use: Yes    Alcohol/week: 12.6 oz    Types: 7 Glasses of wine, 14 Shots of liquor per week    Family History  Problem Relation Age of Onset  . Diverticulosis Mother   . Skin cancer Mother   . Varicose Veins Mother   . Hyperlipidemia Father   . Heart disease Father   . Heart attack Father   . Colon cancer Neg Hx   . Esophageal cancer Neg Hx   . Rectal cancer Neg Hx   . Stomach cancer Neg Hx      ROS  Objective:  Physical Exam  Constitutional: He is oriented to person, place, and time. He appears well-developed. No distress.  Overweight  HENT:  Head: Normocephalic and atraumatic.  Right Ear: External ear normal.  Left Ear: External ear normal.  Nose: Nose normal.  Mouth/Throat: Oropharynx is clear and moist.  Eyes: Conjunctivae and EOM are normal.  Neck: Normal range of motion. Neck supple.  Cardiovascular: Normal rate, regular rhythm, normal heart sounds and intact distal pulses.  No murmur heard. Respiratory: Effort normal and breath sounds normal. No respiratory distress. He has no wheezes.  GI: Soft. Bowel sounds are normal. He exhibits no  distension. There is no tenderness.  Musculoskeletal:       Right hip: Normal.       Right knee: Normal.       Left knee: Normal.  Left Hip Exam: ROM: Flexion to 110, Internal Rotation 0, External Rotation 10, and abduction 20 without discomfort.  There is no tenderness over the greater trochanter.  There is no pain on provocative testing of the hip.  Neurological: He is alert and oriented to person, place, and time. He has normal strength. No sensory deficit.  Skin: No rash noted. He is not diaphoretic. No erythema.  Psychiatric: He has a normal mood and affect. His behavior is normal.    Vitals Ht: 5 ft 8 in  Wt: 198 lbs  BMI: 30.1  BP: 128/70 HR:      72 bpm   Imaging Review Plain radiographs demonstrate severe degenerative joint disease of the left hip(s). The bone quality appears to be good for age and reported activity level.    Preoperative templating of the joint replacement has been completed, documented, and submitted to the Operating Room personnel in order to optimize intra-operative equipment management.    Assessment/Plan:  End stage primary osteoarthritis, left hip(s)  The patient history, physical examination, clinical judgement of the provider and imaging studies are consistent with end stage degenerative joint disease of the left hip(s) and total hip arthroplasty is deemed medically necessary. The treatment options including medical management, injection therapy, arthroscopy and arthroplasty were discussed at length. The risks and benefits of total hip arthroplasty were presented and reviewed. The risks due to aseptic loosening, infection, stiffness, dislocation/subluxation,  thromboembolic complications and other imponderables were discussed.  The patient acknowledged the explanation, agreed to proceed with the plan and consent was signed. Patient is being admitted for inpatient treatment for surgery, pain control, PT, OT, prophylactic antibiotics, VTE  prophylaxis, progressive ambulation and ADL's and discharge planning.The patient is planning to be discharged home with HEP.     Therapy Plans: HEP  Disposition: Home with family  Planned DVT prophylaxis: Eliquis 10 mg QD per Virgina Jock  DME needed: walker, 3-n-1  PCP: Dr. Virgina Jock Pulmonologist: [place] Cardiologist: Larence Penning [facility].)  Other: Dr. Virgina Jock to inform pt of when to hold Elliquis prior to surgery, appt  scheduled with him already. Topical TXA (hx of PE)   Ardeen Jourdain, PA-C

## 2017-09-29 NOTE — Progress Notes (Signed)
Final EKG dated 09-25-2017 in epic.

## 2017-09-30 MED ORDER — TRANEXAMIC ACID 1000 MG/10ML IV SOLN
2000.0000 mg | INTRAVENOUS | Status: DC
  Filled 2017-09-30: qty 20

## 2017-09-30 NOTE — Anesthesia Preprocedure Evaluation (Addendum)
Anesthesia Evaluation  Patient identified by MRN, date of birth, ID band Patient awake    Reviewed: Allergy & Precautions, H&P , NPO status , Patient's Chart, lab work & pertinent test results  Airway Mallampati: III  TM Distance: >3 FB Neck ROM: Full    Dental no notable dental hx. (+) Teeth Intact, Dental Advisory Given   Pulmonary sleep apnea , Current Smoker,    Pulmonary exam normal breath sounds clear to auscultation       Cardiovascular Exercise Tolerance: Good hypertension, Pt. on medications  Rhythm:Regular Rate:Normal     Neuro/Psych Anxiety negative neurological ROS  negative psych ROS   GI/Hepatic Neg liver ROS, GERD  Medicated and Controlled,  Endo/Other  negative endocrine ROS  Renal/GU negative Renal ROS  negative genitourinary   Musculoskeletal  (+) Arthritis , Osteoarthritis,    Abdominal Normal abdominal exam  (+)   Peds  Hematology negative hematology ROS (+)   Anesthesia Other Findings   Reproductive/Obstetrics negative OB ROS                             Anesthesia Physical  Anesthesia Plan  ASA: III  Anesthesia Plan: Spinal   Post-op Pain Management:    Induction:   PONV Risk Score and Plan: 1 and Ondansetron and Propofol infusion  Airway Management Planned:   Additional Equipment:   Intra-op Plan:   Post-operative Plan:   Informed Consent: I have reviewed the patients History and Physical, chart, labs and discussed the procedure including the risks, benefits and alternatives for the proposed anesthesia with the patient or authorized representative who has indicated his/her understanding and acceptance.   Dental advisory given  Plan Discussed with: CRNA  Anesthesia Plan Comments: (Pts last dose of eliquis ~7am and ASRA guidelines state 72 hrs for neuraxial procedure. )       Anesthesia Quick Evaluation

## 2017-10-01 ENCOUNTER — Inpatient Hospital Stay (HOSPITAL_COMMUNITY): Payer: Medicare Other | Admitting: Anesthesiology

## 2017-10-01 ENCOUNTER — Other Ambulatory Visit: Payer: Self-pay

## 2017-10-01 ENCOUNTER — Inpatient Hospital Stay (HOSPITAL_COMMUNITY): Payer: Medicare Other

## 2017-10-01 ENCOUNTER — Encounter (HOSPITAL_COMMUNITY): Payer: Self-pay | Admitting: *Deleted

## 2017-10-01 ENCOUNTER — Inpatient Hospital Stay (HOSPITAL_COMMUNITY)
Admission: RE | Admit: 2017-10-01 | Discharge: 2017-10-02 | DRG: 470 | Disposition: A | Discharge status: Home / Self Care | Payer: Medicare Other | Source: Ambulatory Visit | Attending: Orthopedic Surgery | Admitting: Orthopedic Surgery

## 2017-10-01 ENCOUNTER — Encounter (HOSPITAL_COMMUNITY)
Admission: RE | Disposition: A | Discharge status: Home / Self Care | Payer: Self-pay | Source: Ambulatory Visit | Attending: Orthopedic Surgery

## 2017-10-01 DIAGNOSIS — N400 Enlarged prostate without lower urinary tract symptoms: Secondary | ICD-10-CM | POA: Diagnosis present

## 2017-10-01 DIAGNOSIS — F17290 Nicotine dependence, other tobacco product, uncomplicated: Secondary | ICD-10-CM | POA: Diagnosis present

## 2017-10-01 DIAGNOSIS — Z7951 Long term (current) use of inhaled steroids: Secondary | ICD-10-CM

## 2017-10-01 DIAGNOSIS — M1612 Unilateral primary osteoarthritis, left hip: Secondary | ICD-10-CM | POA: Diagnosis not present

## 2017-10-01 DIAGNOSIS — J302 Other seasonal allergic rhinitis: Secondary | ICD-10-CM | POA: Diagnosis not present

## 2017-10-01 DIAGNOSIS — Z880 Allergy status to penicillin: Secondary | ICD-10-CM | POA: Diagnosis not present

## 2017-10-01 DIAGNOSIS — M109 Gout, unspecified: Secondary | ICD-10-CM | POA: Diagnosis present

## 2017-10-01 DIAGNOSIS — Z9181 History of falling: Secondary | ICD-10-CM | POA: Diagnosis not present

## 2017-10-01 DIAGNOSIS — E663 Overweight: Secondary | ICD-10-CM | POA: Diagnosis present

## 2017-10-01 DIAGNOSIS — F419 Anxiety disorder, unspecified: Secondary | ICD-10-CM | POA: Diagnosis present

## 2017-10-01 DIAGNOSIS — Z96653 Presence of artificial knee joint, bilateral: Secondary | ICD-10-CM | POA: Diagnosis present

## 2017-10-01 DIAGNOSIS — Z6829 Body mass index (BMI) 29.0-29.9, adult: Secondary | ICD-10-CM | POA: Diagnosis not present

## 2017-10-01 DIAGNOSIS — Z888 Allergy status to other drugs, medicaments and biological substances status: Secondary | ICD-10-CM | POA: Diagnosis not present

## 2017-10-01 DIAGNOSIS — Z471 Aftercare following joint replacement surgery: Secondary | ICD-10-CM | POA: Diagnosis not present

## 2017-10-01 DIAGNOSIS — Z96611 Presence of right artificial shoulder joint: Secondary | ICD-10-CM | POA: Diagnosis present

## 2017-10-01 DIAGNOSIS — Z79899 Other long term (current) drug therapy: Secondary | ICD-10-CM

## 2017-10-01 DIAGNOSIS — Z7901 Long term (current) use of anticoagulants: Secondary | ICD-10-CM

## 2017-10-01 DIAGNOSIS — Z86718 Personal history of other venous thrombosis and embolism: Secondary | ICD-10-CM

## 2017-10-01 DIAGNOSIS — K625 Hemorrhage of anus and rectum: Secondary | ICD-10-CM | POA: Diagnosis not present

## 2017-10-01 DIAGNOSIS — E785 Hyperlipidemia, unspecified: Secondary | ICD-10-CM | POA: Diagnosis not present

## 2017-10-01 DIAGNOSIS — K219 Gastro-esophageal reflux disease without esophagitis: Secondary | ICD-10-CM | POA: Diagnosis not present

## 2017-10-01 DIAGNOSIS — I6529 Occlusion and stenosis of unspecified carotid artery: Secondary | ICD-10-CM | POA: Diagnosis not present

## 2017-10-01 DIAGNOSIS — Z86711 Personal history of pulmonary embolism: Secondary | ICD-10-CM | POA: Diagnosis not present

## 2017-10-01 DIAGNOSIS — M169 Osteoarthritis of hip, unspecified: Secondary | ICD-10-CM | POA: Diagnosis present

## 2017-10-01 DIAGNOSIS — Z85820 Personal history of malignant melanoma of skin: Secondary | ICD-10-CM | POA: Diagnosis not present

## 2017-10-01 DIAGNOSIS — I10 Essential (primary) hypertension: Secondary | ICD-10-CM | POA: Diagnosis not present

## 2017-10-01 DIAGNOSIS — Z96642 Presence of left artificial hip joint: Secondary | ICD-10-CM | POA: Diagnosis not present

## 2017-10-01 DIAGNOSIS — Z96649 Presence of unspecified artificial hip joint: Secondary | ICD-10-CM

## 2017-10-01 HISTORY — PX: TOTAL HIP ARTHROPLASTY: SHX124

## 2017-10-01 LAB — TYPE AND SCREEN
ABO/RH(D): AB POS
Antibody Screen: NEGATIVE

## 2017-10-01 SURGERY — ARTHROPLASTY, HIP, TOTAL, ANTERIOR APPROACH
Anesthesia: Spinal | Site: Hip | Laterality: Left

## 2017-10-01 MED ORDER — FLUTICASONE PROPIONATE 50 MCG/ACT NA SUSP: 2.0000 | Freq: Every day | NASAL | Status: DC | PRN

## 2017-10-01 MED ORDER — METHOCARBAMOL 1000 MG/10ML IJ SOLN
500.0000 mg | Freq: Four times a day (QID) | INTRAVENOUS | Status: DC | PRN
  Administered 2017-10-01: 500 mg via INTRAVENOUS
  Filled 2017-10-01: qty 550

## 2017-10-01 MED ORDER — ROSUVASTATIN CALCIUM 10 MG PO TABS
10.0000 mg | ORAL_TABLET | Freq: Every day | ORAL | Status: DC
  Administered 2017-10-02: 10 mg via ORAL
  Filled 2017-10-01: qty 1

## 2017-10-01 MED ORDER — ACETAMINOPHEN 500 MG PO TABS
1000.0000 mg | ORAL_TABLET | Freq: Four times a day (QID) | ORAL | Status: DC
  Administered 2017-10-01 – 2017-10-02 (×3): 1000 mg via ORAL
  Filled 2017-10-01 (×2): qty 2

## 2017-10-01 MED ORDER — APIXABAN 2.5 MG PO TABS
2.5000 mg | ORAL_TABLET | Freq: Two times a day (BID) | ORAL | Status: DC
  Administered 2017-10-02: 2.5 mg via ORAL
  Filled 2017-10-01: qty 1

## 2017-10-01 MED ORDER — SODIUM CHLORIDE 0.9 % IV SOLN
INTRAVENOUS | Status: DC
Start: 2017-10-01 — End: 2017-10-02
  Administered 2017-10-01: 12:00:00 via INTRAVENOUS

## 2017-10-01 MED ORDER — MIDAZOLAM HCL 5 MG/5ML IJ SOLN
INTRAMUSCULAR | Status: DC | PRN
  Administered 2017-10-01: 2 mg via INTRAVENOUS

## 2017-10-01 MED ORDER — DEXAMETHASONE SODIUM PHOSPHATE 10 MG/ML IJ SOLN
INTRAMUSCULAR | Status: AC
  Filled 2017-10-01: qty 1

## 2017-10-01 MED ORDER — ONDANSETRON HCL 4 MG/2ML IJ SOLN
INTRAMUSCULAR | Status: AC
  Filled 2017-10-01: qty 2

## 2017-10-01 MED ORDER — HYDROCHLOROTHIAZIDE 12.5 MG PO CAPS
12.5000 mg | ORAL_CAPSULE | Freq: Every day | ORAL | Status: DC
  Administered 2017-10-02: 12.5 mg via ORAL
  Filled 2017-10-01: qty 1

## 2017-10-01 MED ORDER — METOCLOPRAMIDE HCL 5 MG PO TABS: 5.0000 mg | ORAL_TABLET | Freq: Three times a day (TID) | ORAL | Status: DC | PRN

## 2017-10-01 MED ORDER — CHLORHEXIDINE GLUCONATE 4 % EX LIQD: 60.0000 mL | Freq: Once | CUTANEOUS | Status: DC

## 2017-10-01 MED ORDER — ONDANSETRON HCL 4 MG/2ML IJ SOLN
INTRAMUSCULAR | Status: DC | PRN
  Administered 2017-10-01: 4 mg via INTRAVENOUS

## 2017-10-01 MED ORDER — MEPERIDINE HCL 50 MG/ML IJ SOLN: 6.2500 mg | INTRAMUSCULAR | Status: DC | PRN

## 2017-10-01 MED ORDER — PROPOFOL 10 MG/ML IV BOLUS
INTRAVENOUS | Status: AC
  Filled 2017-10-01: qty 60

## 2017-10-01 MED ORDER — METHOCARBAMOL 500 MG PO TABS
500.0000 mg | ORAL_TABLET | Freq: Four times a day (QID) | ORAL | Status: DC | PRN
  Administered 2017-10-01: 500 mg via ORAL

## 2017-10-01 MED ORDER — POLYETHYLENE GLYCOL 3350 17 G PO PACK: 17.0000 g | PACK | Freq: Every day | ORAL | Status: DC | PRN

## 2017-10-01 MED ORDER — METHOCARBAMOL 500 MG PO TABS: 500.0000 mg | ORAL_TABLET | Freq: Four times a day (QID) | ORAL | 0 refills | Status: DC | PRN

## 2017-10-01 MED ORDER — TRAMADOL HCL 50 MG PO TABS
50.0000 mg | ORAL_TABLET | Freq: Four times a day (QID) | ORAL | Status: DC | PRN
Start: 2017-10-01 — End: 2017-10-02

## 2017-10-01 MED ORDER — OXYCODONE HCL 5 MG PO TABS
5.0000 mg | ORAL_TABLET | ORAL | Status: DC | PRN
  Administered 2017-10-01 (×2): 5 mg via ORAL
  Administered 2017-10-02: 10 mg via ORAL
  Filled 2017-10-01: qty 1
  Filled 2017-10-01: qty 2
  Filled 2017-10-01: qty 1

## 2017-10-01 MED ORDER — BUPIVACAINE IN DEXTROSE 0.75-8.25 % IT SOLN
INTRATHECAL | Status: DC | PRN
  Administered 2017-10-01: 1.8 mL via INTRATHECAL

## 2017-10-01 MED ORDER — PROBENECID 500 MG PO TABS
500.0000 mg | ORAL_TABLET | Freq: Every day | ORAL | Status: DC
  Administered 2017-10-02: 500 mg via ORAL
  Filled 2017-10-01: qty 1

## 2017-10-01 MED ORDER — ACETAMINOPHEN 325 MG PO TABS: 325.0000 mg | ORAL_TABLET | Freq: Four times a day (QID) | ORAL | Status: DC | PRN

## 2017-10-01 MED ORDER — HYDROMORPHONE HCL 1 MG/ML IJ SOLN: 0.2500 mg | INTRAMUSCULAR | Status: DC | PRN

## 2017-10-01 MED ORDER — FENTANYL CITRATE (PF) 100 MCG/2ML IJ SOLN
INTRAMUSCULAR | Status: DC | PRN
  Administered 2017-10-01: 50 ug via INTRAVENOUS

## 2017-10-01 MED ORDER — PHENYLEPHRINE HCL 10 MG/ML IJ SOLN
INTRAMUSCULAR | Status: AC
  Filled 2017-10-01: qty 1

## 2017-10-01 MED ORDER — ONDANSETRON HCL 4 MG/2ML IJ SOLN: 4.0000 mg | Freq: Four times a day (QID) | INTRAMUSCULAR | Status: DC | PRN

## 2017-10-01 MED ORDER — OXYCODONE HCL 5 MG PO TABS: 10.0000 mg | ORAL_TABLET | ORAL | Status: DC | PRN

## 2017-10-01 MED ORDER — PHENYLEPHRINE 40 MCG/ML (10ML) SYRINGE FOR IV PUSH (FOR BLOOD PRESSURE SUPPORT): PREFILLED_SYRINGE | INTRAVENOUS | Status: DC | PRN

## 2017-10-01 MED ORDER — MIDAZOLAM HCL 2 MG/2ML IJ SOLN
INTRAMUSCULAR | Status: AC
  Filled 2017-10-01: qty 2

## 2017-10-01 MED ORDER — ACETAMINOPHEN 10 MG/ML IV SOLN
1000.0000 mg | Freq: Four times a day (QID) | INTRAVENOUS | Status: DC
  Administered 2017-10-01: 1000 mg via INTRAVENOUS
  Filled 2017-10-01: qty 100

## 2017-10-01 MED ORDER — MENTHOL 3 MG MT LOZG: 1.0000 | LOZENGE | OROMUCOSAL | Status: DC | PRN

## 2017-10-01 MED ORDER — TAMSULOSIN HCL 0.4 MG PO CAPS
0.4000 mg | ORAL_CAPSULE | Freq: Every day | ORAL | Status: DC
  Administered 2017-10-02: 0.4 mg via ORAL
  Filled 2017-10-01: qty 1

## 2017-10-01 MED ORDER — DEXAMETHASONE SODIUM PHOSPHATE 10 MG/ML IJ SOLN
8.0000 mg | Freq: Once | INTRAMUSCULAR | Status: AC
  Administered 2017-10-01: 10 mg via INTRAVENOUS

## 2017-10-01 MED ORDER — PROMETHAZINE HCL 25 MG/ML IJ SOLN: 6.2500 mg | INTRAMUSCULAR | Status: DC | PRN

## 2017-10-01 MED ORDER — LOSARTAN POTASSIUM 50 MG PO TABS
100.0000 mg | ORAL_TABLET | Freq: Every day | ORAL | Status: DC
  Administered 2017-10-02: 100 mg via ORAL
  Filled 2017-10-01: qty 2

## 2017-10-01 MED ORDER — LACTATED RINGERS IV SOLN
INTRAVENOUS | Status: DC
  Administered 2017-10-01: 1000 mL via INTRAVENOUS

## 2017-10-01 MED ORDER — PHENYLEPHRINE HCL 10 MG/ML IJ SOLN: 30.0000 ug/min | INTRAVENOUS | Status: DC

## 2017-10-01 MED ORDER — DOCUSATE SODIUM 100 MG PO CAPS
100.0000 mg | ORAL_CAPSULE | Freq: Two times a day (BID) | ORAL | Status: DC
  Administered 2017-10-01 – 2017-10-02 (×2): 100 mg via ORAL
  Filled 2017-10-01 (×2): qty 1

## 2017-10-01 MED ORDER — PANTOPRAZOLE SODIUM 40 MG PO TBEC: 80.0000 mg | DELAYED_RELEASE_TABLET | Freq: Every day | ORAL | Status: DC | PRN

## 2017-10-01 MED ORDER — PROPOFOL 500 MG/50ML IV EMUL
INTRAVENOUS | Status: DC | PRN
  Administered 2017-10-01: 50 ug/kg/min via INTRAVENOUS

## 2017-10-01 MED ORDER — PHENOL 1.4 % MT LIQD: 1.0000 | OROMUCOSAL | Status: DC | PRN

## 2017-10-01 MED ORDER — PHENYLEPHRINE HCL 10 MG/ML IJ SOLN
INTRAMUSCULAR | Status: AC
Start: 2017-10-01 — End: ?
  Filled 2017-10-01: qty 1

## 2017-10-01 MED ORDER — BUPIVACAINE HCL (PF) 0.25 % IJ SOLN
INTRAMUSCULAR | Status: DC | PRN
  Administered 2017-10-01: 30 mL

## 2017-10-01 MED ORDER — HYDROMORPHONE HCL 1 MG/ML IJ SOLN: 0.5000 mg | INTRAMUSCULAR | Status: DC | PRN

## 2017-10-01 MED ORDER — METOCLOPRAMIDE HCL 5 MG/ML IJ SOLN: 5.0000 mg | Freq: Three times a day (TID) | INTRAMUSCULAR | Status: DC | PRN

## 2017-10-01 MED ORDER — SODIUM CHLORIDE 0.9 % IR SOLN
Status: DC | PRN
  Administered 2017-10-01: 1000 mL

## 2017-10-01 MED ORDER — BISACODYL 10 MG RE SUPP: 10.0000 mg | Freq: Every day | RECTAL | Status: DC | PRN

## 2017-10-01 MED ORDER — TRANEXAMIC ACID 1000 MG/10ML IV SOLN
INTRAVENOUS | Status: DC | PRN
  Administered 2017-10-01: 2000 mg via TOPICAL

## 2017-10-01 MED ORDER — MOMETASONE FURO-FORMOTEROL FUM 100-5 MCG/ACT IN AERO
2.0000 | INHALATION_SPRAY | Freq: Two times a day (BID) | RESPIRATORY_TRACT | Status: DC
  Administered 2017-10-01: 2 via RESPIRATORY_TRACT
  Filled 2017-10-01: qty 8.8

## 2017-10-01 MED ORDER — PROPOFOL 10 MG/ML IV BOLUS
INTRAVENOUS | Status: DC | PRN
  Administered 2017-10-01: 30 mg via INTRAVENOUS

## 2017-10-01 MED ORDER — VANCOMYCIN HCL IN DEXTROSE 1-5 GM/200ML-% IV SOLN
1000.0000 mg | Freq: Two times a day (BID) | INTRAVENOUS | Status: AC
  Administered 2017-10-01: 1000 mg via INTRAVENOUS
  Filled 2017-10-01: qty 200

## 2017-10-01 MED ORDER — FLEET ENEMA 7-19 GM/118ML RE ENEM: 1.0000 | ENEMA | Freq: Once | RECTAL | Status: DC | PRN

## 2017-10-01 MED ORDER — ONDANSETRON HCL 4 MG PO TABS: 4.0000 mg | ORAL_TABLET | Freq: Four times a day (QID) | ORAL | Status: DC | PRN

## 2017-10-01 MED ORDER — DIPHENHYDRAMINE HCL 12.5 MG/5ML PO ELIX: 12.5000 mg | ORAL_SOLUTION | ORAL | Status: DC | PRN

## 2017-10-01 MED ORDER — DEXTROSE 5 % IV SOLN
INTRAVENOUS | Status: DC | PRN
  Administered 2017-10-01: 40 ug/min via INTRAVENOUS

## 2017-10-01 MED ORDER — VANCOMYCIN HCL IN DEXTROSE 1-5 GM/200ML-% IV SOLN
1000.0000 mg | INTRAVENOUS | Status: AC
  Administered 2017-10-01: 1000 mg via INTRAVENOUS
  Filled 2017-10-01: qty 200

## 2017-10-01 MED ORDER — FENTANYL CITRATE (PF) 100 MCG/2ML IJ SOLN
INTRAMUSCULAR | Status: AC
  Filled 2017-10-01: qty 2

## 2017-10-01 MED ORDER — EPHEDRINE SULFATE-NACL 50-0.9 MG/10ML-% IV SOSY
PREFILLED_SYRINGE | INTRAVENOUS | Status: DC | PRN
  Administered 2017-10-01: 5 mg via INTRAVENOUS

## 2017-10-01 MED ORDER — LOSARTAN POTASSIUM-HCTZ 100-12.5 MG PO TABS: 1.0000 | ORAL_TABLET | Freq: Every day | ORAL | Status: DC

## 2017-10-01 MED ORDER — PROPOFOL 10 MG/ML IV BOLUS
INTRAVENOUS | Status: AC
Start: 2017-10-01 — End: ?
  Filled 2017-10-01: qty 20

## 2017-10-01 MED ORDER — BUPIVACAINE-EPINEPHRINE (PF) 0.5% -1:200000 IJ SOLN
INTRAMUSCULAR | Status: AC
  Filled 2017-10-01: qty 30

## 2017-10-01 MED ORDER — BUPIVACAINE HCL (PF) 0.25 % IJ SOLN
INTRAMUSCULAR | Status: AC
  Filled 2017-10-01: qty 30

## 2017-10-01 MED ORDER — STERILE WATER FOR IRRIGATION IR SOLN
Status: DC | PRN
  Administered 2017-10-01: 2000 mL

## 2017-10-01 MED ORDER — DEXAMETHASONE SODIUM PHOSPHATE 10 MG/ML IJ SOLN
10.0000 mg | Freq: Once | INTRAMUSCULAR | Status: AC
  Administered 2017-10-02: 10 mg via INTRAVENOUS
  Filled 2017-10-01: qty 1

## 2017-10-01 SURGICAL SUPPLY — 37 items
BAG DECANTER FOR FLEXI CONT (MISCELLANEOUS) ×2 IMPLANT
BAG SPEC THK2 15X12 ZIP CLS (MISCELLANEOUS)
BAG ZIPLOCK 12X15 (MISCELLANEOUS) IMPLANT
BLADE EXTENDED COATED 6.5IN (ELECTRODE) ×2 IMPLANT
BLADE SAG 18X100X1.27 (BLADE) ×2 IMPLANT
CAPT HIP TOTAL 2 ×1 IMPLANT
CLOTH BEACON ORANGE TIMEOUT ST (SAFETY) ×2 IMPLANT
COVER PERINEAL POST (MISCELLANEOUS) ×2 IMPLANT
COVER SURGICAL LIGHT HANDLE (MISCELLANEOUS) ×2 IMPLANT
DECANTER SPIKE VIAL GLASS SM (MISCELLANEOUS) ×2 IMPLANT
DRAPE STERI IOBAN 125X83 (DRAPES) ×2 IMPLANT
DRAPE U-SHAPE 47X51 STRL (DRAPES) ×4 IMPLANT
DRSG ADAPTIC 3X8 NADH LF (GAUZE/BANDAGES/DRESSINGS) ×2 IMPLANT
DRSG MEPILEX BORDER 4X4 (GAUZE/BANDAGES/DRESSINGS) ×2 IMPLANT
DRSG MEPILEX BORDER 4X8 (GAUZE/BANDAGES/DRESSINGS) ×2 IMPLANT
DURAPREP 26ML APPLICATOR (WOUND CARE) ×2 IMPLANT
ELECT REM PT RETURN 15FT ADLT (MISCELLANEOUS) ×2 IMPLANT
EVACUATOR 1/8 PVC DRAIN (DRAIN) ×2 IMPLANT
GLOVE BIO SURGEON STRL SZ8 (GLOVE) ×3 IMPLANT
GLOVE BIOGEL PI IND STRL 6.5 (GLOVE) ×1 IMPLANT
GLOVE BIOGEL PI IND STRL 8 (GLOVE) ×2 IMPLANT
GLOVE BIOGEL PI INDICATOR 6.5 (GLOVE) ×6
GLOVE BIOGEL PI INDICATOR 8 (GLOVE) ×1
GLOVE SURG SS PI 6.5 STRL IVOR (GLOVE) ×1 IMPLANT
GOWN STRL REUS W/TWL LRG LVL3 (GOWN DISPOSABLE) ×2 IMPLANT
GOWN STRL REUS W/TWL XL LVL3 (GOWN DISPOSABLE) ×4 IMPLANT
PACK ANTERIOR HIP CUSTOM (KITS) ×2 IMPLANT
STRIP CLOSURE SKIN 1/2X4 (GAUZE/BANDAGES/DRESSINGS) ×2 IMPLANT
SUT ETHIBOND NAB CT1 #1 30IN (SUTURE) ×2 IMPLANT
SUT MNCRL AB 4-0 PS2 18 (SUTURE) ×2 IMPLANT
SUT STRATAFIX 0 PDS 27 VIOLET (SUTURE) ×2
SUT VIC AB 2-0 CT1 27 (SUTURE) ×4
SUT VIC AB 2-0 CT1 TAPERPNT 27 (SUTURE) ×2 IMPLANT
SUTURE STRATFX 0 PDS 27 VIOLET (SUTURE) ×1 IMPLANT
SYR 50ML LL SCALE MARK (SYRINGE) ×1 IMPLANT
TRAY FOLEY MTR SLVR 16FR STAT (SET/KITS/TRAYS/PACK) ×2 IMPLANT
YANKAUER SUCT BULB TIP 10FT TU (MISCELLANEOUS) ×2 IMPLANT

## 2017-10-01 NOTE — Op Note (Signed)
OPERATIVE REPORT- TOTAL HIP ARTHROPLASTY   PREOPERATIVE DIAGNOSIS: Osteoarthritis of the Left hip.   POSTOPERATIVE DIAGNOSIS: Osteoarthritis of the Left  hip.   PROCEDURE: Left total hip arthroplasty, anterior approach.   SURGEON: Gaynelle Arabian, MD   ASSISTANT: Newt Minion, RN  ANESTHESIA:  Spinal  ESTIMATED BLOOD LOSS:-300 mL    DRAINS: Hemovac x1.   COMPLICATIONS: None   CONDITION: PACU - hemodynamically stable.   BRIEF CLINICAL NOTE: [name] is a 77 y.o. male who has advanced end-  stage arthritis of their Left  hip with progressively worsening pain and  dysfunction.The patient has failed nonoperative management and presents for  total hip arthroplasty.   PROCEDURE IN DETAIL: After successful administration of spinal  anesthetic, the traction boots for the [facility] bed were placed on both  feet and the patient was placed onto the [facility] bed, boots placed into the leg  holders. The Left hip was then isolated from the perineum with plastic  drapes and prepped and draped in the usual sterile fashion. ASIS and  greater trochanter were marked and a oblique incision was made, starting  at about 1 cm lateral and 2 cm distal to the ASIS and coursing towards  the anterior cortex of the femur. The skin was cut with a 10 blade  through subcutaneous tissue to the level of the fascia overlying the  tensor fascia lata muscle. The fascia was then incised in line with the  incision at the junction of the anterior third and posterior 2/3rd. The  muscle was teased off the fascia and then the interval between the TFL  and the rectus was developed. The Hohmann retractor was then placed at  the top of the femoral neck over the capsule. The vessels overlying the  capsule were cauterized and the fat on top of the capsule was removed.  A Hohmann retractor was then placed anterior underneath the rectus  femoris to give exposure to the entire anterior capsule. A T-shaped  capsulotomy  was performed. The edges were tagged and the femoral head  was identified.       Osteophytes are removed off the superior acetabulum.  The femoral neck was then cut in situ with an oscillating saw. Traction  was then applied to the left lower extremity utilizing the [facility]  traction. The femoral head was then removed. Retractors were placed  around the acetabulum and then circumferential removal of the labrum was  performed. Osteophytes were also removed. Reaming starts at 47 mm to  medialize and  Increased in 2 mm increments to 51 mm. We reamed in  approximately 40 degrees of abduction, 20 degrees anteversion. A 52 mm  pinnacle acetabular shell was then impacted in anatomic position under  fluoroscopic guidance with excellent purchase. We did not need to place  any additional dome screws. A 32 m neutral + 4 marathon liner was then  placed into the acetabular shell.       The femoral lift was then placed along the lateral aspect of the femur  just distal to the vastus ridge. The leg was  externally rotated and capsule  was stripped off the inferior aspect of the femoral neck down to the  level of the lesser trochanter, this was done with electrocautery. The femur was lifted after this was performed. The  leg was then placed in an extended and adducted position essentially delivering the femur. We also removed the capsule superiorly and the piriformis from the piriformis  fossa to gain excellent exposure of the  proximal femur. Rongeur was used to remove some cancellous bone to get  into the lateral portion of the proximal femur for placement of the  initial starter reamer. The starter broaches was placed  the starter broach  and was shown to go down the center of the canal. Broaching  with the  Corail system was then performed starting at size 8, coursing  Up to size 13. A size 13 had excellent torsional and rotational  and axial stability. The trial high offset neck was then placed  with a 32  + 1 trial head. The hip was then reduced. We confirmed that  the stem was in the canal both on AP and lateral x-rays. It also has excellent sizing. The hip was reduced with outstanding stability through full extension and full external rotation.. AP pelvis was taken and the leg lengths were measured and found to be equal. Hip was then dislocated again and the femoral head and neck removed. The  femoral broach was removed. Size 13 Corail stem with a high offset  neck was then impacted into the femur following native anteversion. Has  excellent purchase in the canal. Excellent torsional and rotational and  axial stability. It is confirmed to be in the canal on AP and lateral  fluoroscopic views. The 32 + 1 metal head was placed and the hip  reduced with outstanding stability. Again AP pelvis was taken and it  confirmed that the leg lengths were equal. The wound was then copiously  irrigated with saline solution and the capsule reattached and repaired  with Ethibond suture. 30 ml of .25% Bupivicaine was  injected into the capsule and into the edge of the tensor fascia lata as well as subcutaneous tissue. The fascia overlying the tensor fascia lata was then closed with a running #1 V-Loc. Subcu was closed with interrupted 2-0 Vicryl and subcuticular running 4-0 Monocryl. Incision was cleaned  and dried. Steri-Strips and a bulky sterile dressing applied. Hemovac  drain was hooked to suction and then the patient was awakened and transported to  recovery in stable condition.        Please note that a surgical assistant was a medical necessity for this procedure to perform it in a safe and expeditious manner. Assistant was necessary to provide appropriate retraction of vital neurovascular structures and to prevent femoral fracture and allow for anatomic placement of the prosthesis.  Gaynelle Arabian, M.D.

## 2017-10-01 NOTE — Anesthesia Procedure Notes (Addendum)
Spinal  Patient location during procedure: OR Reason for block: at surgeon's request Staffing Resident/CRNA: Anne Fu, CRNA Performed: resident/CRNA  Preanesthetic Checklist Completed: patient identified, site marked, surgical consent, pre-op evaluation, timeout performed, IV checked, risks and benefits discussed and monitors and equipment checked Spinal Block Patient position: sitting Prep: DuraPrep Patient monitoring: heart rate, continuous pulse ox and blood pressure Approach: right paramedian Location: L2-3 Injection technique: single-shot Needle Needle type: Pencan  Needle gauge: 24 G Needle length: 9 cm Assessment Sensory level: T6 Additional Notes Expiration date of kit checked and confirmed. Patient tolerated procedure well, without complications. X 1 attempt with noted clear CSF return. Loss of motor and sensory on exam post injection.

## 2017-10-01 NOTE — Discharge Instructions (Signed)
° °Dr. Frank Aluisio °Total Joint Specialist °[facility] °[address] °[place], [place] [address] °[phone] ° °ANTERIOR APPROACH TOTAL HIP REPLACEMENT POSTOPERATIVE DIRECTIONS ° ° °Hip Rehabilitation, Guidelines Following Surgery  °The results of a hip operation are greatly improved after range of motion and muscle strengthening exercises. Follow all safety measures which are given to protect your hip. If any of these exercises cause increased pain or swelling in your joint, decrease the amount until you are comfortable again. Then slowly increase the exercises. Call your caregiver if you have problems or questions.  ° °HOME CARE INSTRUCTIONS  °Remove items at home which could result in a fall. This includes throw rugs or furniture in walking pathways.  °· ICE to the affected hip every three hours for 30 minutes at a time and then as needed for pain and swelling.  Continue to use ice on the hip for pain and swelling from surgery. You may notice swelling that will progress down to the foot and ankle.  This is normal after surgery.  Elevate the leg when you are not up walking on it.   °· Continue to use the breathing machine which will help keep your temperature down.  It is common for your temperature to cycle up and down following surgery, especially at night when you are not up moving around and exerting yourself.  The breathing machine keeps your lungs expanded and your temperature down. ° ° °DIET °You may resume your previous home diet once your are discharged from the hospital. ° °DRESSING / WOUND CARE / SHOWERING °You may change your dressing every day with sterile gauze.  Please use good hand washing techniques before changing the dressing.  Do not use any lotions or creams on the incision until instructed by your surgeon. °You may start showering once you are discharged home but do not submerge the incision under water. Just pat the incision dry and apply a dry gauze dressing on  daily. °Change the surgical dressing daily and reapply a dry dressing each time. ° °ACTIVITY °Walk with your walker as instructed. °Use walker as long as suggested by your caregivers. °Avoid periods of inactivity such as sitting longer than an hour when not asleep. This helps prevent blood clots.  °You may resume a sexual relationship in one month or when given the OK by your doctor.  °You may return to work once you are cleared by your doctor.  °Do not drive a car for 6 weeks or until released by you surgeon.  °Do not drive while taking narcotics. ° °WEIGHT BEARING °Weight bearing as tolerated with assist device (walker, cane, etc) as directed, use it as long as suggested by your surgeon or therapist, typically at least 4-6 weeks. ° °POSTOPERATIVE CONSTIPATION PROTOCOL °Constipation - defined medically as fewer than three stools per week and severe constipation as less than one stool per week. ° °One of the most common issues patients have following surgery is constipation.  Even if you have a regular bowel pattern at home, your normal regimen is likely to be disrupted due to multiple reasons following surgery.  Combination of anesthesia, postoperative narcotics, change in appetite and fluid intake all can affect your bowels.  In order to avoid complications following surgery, here are some recommendations in order to help you during your recovery period. ° °Colace (docusate) - Pick up an over-the-counter form of Colace or another stool softener and take twice a day as long as you are requiring postoperative pain   medications.  Take with a full glass of water daily.  If you experience loose stools or diarrhea, hold the colace until you stool forms back up.  If your symptoms do not get better within 1 week or if they get worse, check with your doctor. ° °Dulcolax (bisacodyl) - Pick up over-the-counter and take as directed by the product packaging as needed to assist with the movement of your bowels.  Take with a full  glass of water.  Use this product as needed if not relieved by Colace only.  ° °MiraLax (polyethylene glycol) - Pick up over-the-counter to have on hand.  MiraLax is a solution that will increase the amount of water in your bowels to assist with bowel movements.  Take as directed and can mix with a glass of water, juice, soda, coffee, or tea.  Take if you go more than two days without a movement. °Do not use MiraLax more than once per day. Call your doctor if you are still constipated or irregular after using this medication for 7 days in a row. ° °If you continue to have problems with postoperative constipation, please contact the office for further assistance and recommendations.  If you experience "the worst abdominal pain ever" or develop nausea or vomiting, please contact the office immediatly for further recommendations for treatment. ° °ITCHING ° If you experience itching with your medications, try taking only a single pain pill, or even half a pain pill at a time.  You can also use Benadryl over the counter for itching or also to help with sleep.  ° °TED HOSE STOCKINGS °Wear the elastic stockings on both legs for three weeks following surgery during the day but you may remove then at night for sleeping. ° °MEDICATIONS °See your medication summary on the “After Visit Summary” that the nursing staff will review with you prior to discharge.  You may have some home medications which will be placed on hold until you complete the course of blood thinner medication.  It is important for you to complete the blood thinner medication as prescribed by your surgeon.  Continue your approved medications as instructed at time of discharge. ° °PRECAUTIONS °If you experience chest pain or shortness of breath - call 911 immediately for transfer to the hospital emergency department.  °If you develop a fever greater that 101 F, purulent drainage from wound, increased redness or drainage from wound, foul odor from the  wound/dressing, or calf pain - CONTACT YOUR SURGEON.   °                                                °FOLLOW-UP APPOINTMENTS °Make sure you keep all of your appointments after your operation with your surgeon and caregivers. You should call the office at the above phone number and make an appointment for approximately two weeks after the date of your surgery or on the date instructed by your surgeon outlined in the "After Visit Summary". ° °RANGE OF MOTION AND STRENGTHENING EXERCISES  °These exercises are designed to help you keep full movement of your hip joint. Follow your caregiver's or physical therapist's instructions. Perform all exercises about fifteen times, three times per day or as directed. Exercise both hips, even if you have had only one joint replacement. These exercises can be done on a training (exercise) mat, on the floor, on   a table or on a bed. Use whatever works the best and is most comfortable for you. Use music or television while you are exercising so that the exercises are a pleasant break in your day. This will make your life better with the exercises acting as a break in routine you can look forward to.  °Lying on your back, slowly slide your foot toward your buttocks, raising your knee up off the floor. Then slowly slide your foot back down until your leg is straight again.  °Lying on your back spread your legs as far apart as you can without causing discomfort.  °Lying on your side, raise your upper leg and foot straight up from the floor as far as is comfortable. Slowly lower the leg and repeat.  °Lying on your back, tighten up the muscle in the front of your thigh (quadriceps muscles). You can do this by keeping your leg straight and trying to raise your heel off the floor. This helps strengthen the largest muscle supporting your knee.  °Lying on your back, tighten up the muscles of your buttocks both with the legs straight and with the knee bent at a comfortable angle while keeping  your heel on the floor.  ° °IF YOU ARE TRANSFERRED TO A SKILLED REHAB FACILITY °If the patient is transferred to a skilled rehab facility following release from the hospital, a list of the current medications will be sent to the facility for the patient to continue.  When discharged from the skilled rehab facility, please have the facility set up the patient's Home Health Physical Therapy prior to being released. Also, the skilled facility will be responsible for providing the patient with their medications at time of release from the facility to include their pain medication, the muscle relaxants, and their blood thinner medication. If the patient is still at the rehab facility at time of the two week follow up appointment, the skilled rehab facility will also need to assist the patient in arranging follow up appointment in our office and any transportation needs. ° °MAKE SURE YOU:  °Understand these instructions.  °Get help right away if you are not doing well or get worse.  ° ° °Pick up stool softner and laxative for home use following surgery while on pain medications. °Do not submerge incision under water. °Please use good hand washing techniques while changing dressing each day. °May shower starting three days after surgery. °Please use a clean towel to pat the incision dry following showers. °Continue to use ice for pain and swelling after surgery. °Do not use any lotions or creams on the incision until instructed by your surgeon. °

## 2017-10-01 NOTE — Anesthesia Postprocedure Evaluation (Signed)
Anesthesia Post Note  Patient: [name]  Procedure(s) Performed: LEFT TOTAL HIP ARTHROPLASTY ANTERIOR APPROACH (Left Hip)     Patient location during evaluation: PACU Anesthesia Type: Spinal Level of consciousness: awake and alert Pain management: pain level controlled Vital Signs Assessment: post-procedure vital signs reviewed and stable Respiratory status: spontaneous breathing and respiratory function stable Cardiovascular status: blood pressure returned to baseline and stable Postop Assessment: spinal receding Anesthetic complications: no    Last Vitals:  Vitals:   10/01/17 1754 10/01/17 1914  BP: 118/70   Pulse: 91   Resp: 17   Temp: 36.5 C   SpO2: 97% 93%    Last Pain:  Vitals:   10/01/17 1936  TempSrc:   PainSc: [place]

## 2017-10-01 NOTE — Transfer of Care (Signed)
Immediate Anesthesia Transfer of Care Note  Patient: [name]  Procedure(s) Performed: Procedure(s): LEFT TOTAL HIP ARTHROPLASTY ANTERIOR APPROACH (Left)  Patient Location: PACU  Anesthesia Type:Spinal  Level of Consciousness:  sedated, patient cooperative and responds to stimulation  Airway & Oxygen Therapy:Patient Spontanous Breathing and Patient connected to face mask oxgen  Post-op Assessment:  Report given to PACU RN and Post -op Vital signs reviewed and stable  Post vital signs:  Reviewed and stable  Last Vitals:  Vitals:   10/01/17 0632  BP: 106/89  Pulse: 94  Resp: 18  Temp: 36.6 C  SpO2: 82%    Complications: No apparent anesthesia complications

## 2017-10-01 NOTE — Interval H&P Note (Signed)
History and Physical Interval Note:  10/01/2017 8:16 AM  [name]  has presented today for surgery, with the diagnosis of Osteoarthritis Left hip  The various methods of treatment have been discussed with the patient and family. After consideration of risks, benefits and other options for treatment, the patient has consented to  Procedure(s): LEFT TOTAL HIP ARTHROPLASTY ANTERIOR APPROACH (Left) as a surgical intervention .  The patient's history has been reviewed, patient examined, no change in status, stable for surgery.  I have reviewed the patient's chart and labs.  Questions were answered to the patient's satisfaction.     Pilar Plate Hailee Hollick

## 2017-10-01 NOTE — Evaluation (Signed)
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  Pt should progress to dc home with family assist    Follow Up Recommendations Follow surgeon's recommendation for DC plan and follow-up therapies    Equipment Recommendations  None recommended by PT    Recommendations for Other Services       Precautions / Restrictions Precautions Precautions: Fall Restrictions Weight Bearing Restrictions: No Other Position/Activity Restrictions: WBAT      Mobility  Bed Mobility Overal bed mobility: Needs Assistance Bed Mobility: Supine to Sit     Supine to sit: Min assist     General bed mobility comments: cues for sequence and use of R LE to self assist  Transfers Overall transfer level: Needs assistance Equipment used: Rolling walker (2 wheeled) Transfers: Sit to/from Stand Sit to Stand: Min assist         General transfer comment: cues for LE management and use of UEs to self assist  Ambulation/Gait Ambulation/Gait assistance: Min assist Ambulation Distance (Feet): 30 Feet Assistive device: Rolling walker (2 wheeled) Gait Pattern/deviations: Step-to pattern;Decreased step length - right;Decreased step length - left;Shuffle;Trunk flexed Gait velocity: decr   General Gait Details: cues for sequence, posture and position from [employer]            Wheelchair Mobility    Modified Rankin (Stroke Patients Only)       Balance                                             Pertinent Vitals/Pain Pain Assessment: 0-10 Pain Score: 6  Pain Location: L hip Pain Descriptors / Indicators: Aching;Sore Pain Intervention(s): Limited activity within patient's  tolerance;Monitored during session;Premedicated before session;Ice applied    Home Living Family/patient expects to be discharged to:: Private residence Living Arrangements: Spouse/significant other Available Help at Discharge: Family Type of Home: House Home Access: Stairs to enter Entrance Stairs-Rails: Right;Left;Can reach both Entrance Stairs-Number of Steps: 3 Home Layout: Two level;Able to live on main level with bedroom/bathroom Home Equipment: Gilford Rile - 2 wheels;Cane - single point;Bedside commode      Prior Function Level of Independence: Independent;Independent with assistive device(s)         Comments: used cane as needed     Hand Dominance        Extremity/Trunk Assessment   Upper Extremity Assessment Upper Extremity Assessment: Overall WFL for tasks assessed    Lower Extremity Assessment Lower Extremity Assessment: LLE deficits/detail       Communication   Communication: No difficulties  Cognition Arousal/Alertness: Awake/alert Behavior During Therapy: WFL for tasks assessed/performed Overall Cognitive Status: Within Functional Limits for tasks assessed                                        General Comments      Exercises Total Joint Exercises Ankle Circles/Pumps: AROM;Both;15 reps;Supine   Assessment/Plan    PT Assessment Patient needs continued PT services  PT Problem List Decreased strength;Decreased range of motion;Decreased  activity tolerance;Decreased mobility;Decreased knowledge of use of DME;Pain       PT Treatment Interventions DME instruction;Gait training;Stair training;Functional mobility training;Therapeutic activities;Therapeutic exercise;Patient/family education    PT Goals (Current goals can be found in the Care Plan section)  Acute Rehab PT Goals Patient Stated Goal: Regain IND  PT Goal Formulation: With patient Time For Goal Achievement: 10/08/17 Potential to Achieve Goals: Good    Frequency 7X/week    Barriers to discharge        Co-evaluation               AM-PAC PT "6 Clicks" Daily Activity  Outcome Measure Difficulty turning over in bed (including adjusting bedclothes, sheets and blankets)?: Unable Difficulty moving from lying on back to sitting on the side of the bed? : Unable Difficulty sitting down on and standing up from a chair with arms (e.g., wheelchair, bedside commode, etc,.)?: Unable Help needed moving to and from a bed to chair (including a wheelchair)?: A Little Help needed walking in hospital room?: A Little Help needed climbing 3-5 steps with a railing? : A Little 6 Click Score: 12    End of Session Equipment Utilized During Treatment: Gait belt Activity Tolerance: Patient tolerated treatment well Patient left: in chair;with call bell/phone within reach Nurse Communication: Mobility status PT Visit Diagnosis: Difficulty in walking, not elsewhere classified (R26.2)    Time: 7116-5790 PT Time Calculation (min) (ACUTE ONLY): 19 min   Charges:   PT Evaluation $PT Eval Low Complexity: 1 Low     PT G Codes:        Pg [phone]   Glenette Bookwalter 10/01/2017, 5:18 PM

## 2017-10-02 LAB — CBC
HEMATOCRIT: 41.3 % (ref 39.0–52.0)
Hemoglobin: 13 g/dL (ref 13.0–17.0)
MCH: 30.5 pg (ref 26.0–34.0)
MCHC: 31.5 g/dL (ref 30.0–36.0)
MCV: 96.9 fL (ref 78.0–100.0)
Platelets: 146 10*3/uL — ABNORMAL LOW (ref 150–400)
RBC: 4.26 MIL/uL (ref 4.22–5.81)
RDW: 15.3 % (ref 11.5–15.5)
WBC: 13.7 10*3/uL — AB (ref 4.0–10.5)

## 2017-10-02 LAB — BASIC METABOLIC PANEL
ANION GAP: 10 (ref 5–15)
BUN: 18 mg/dL (ref 6–20)
CALCIUM: 8.7 mg/dL — AB (ref 8.9–10.3)
CO2: 23 mmol/L (ref 22–32)
Chloride: 103 mmol/L (ref 101–111)
Creatinine, Ser: 1.25 mg/dL — ABNORMAL HIGH (ref 0.61–1.24)
GFR calc Af Amer: >60 mL/min (ref >60)
GFR calc non Af Amer: 55 mL/min — ABNORMAL LOW (ref >60)
Glucose, Bld: 113 mg/dL — ABNORMAL HIGH (ref 65–99)
Potassium: 4.1 mmol/L (ref 3.5–5.1)
Sodium: 136 mmol/L (ref 135–145)

## 2017-10-02 MED ORDER — OXYCODONE HCL 5 MG PO TABS: 5.0000 mg | ORAL_TABLET | ORAL | 0 refills | Status: DC | PRN

## 2017-10-02 MED ORDER — TRAMADOL HCL 50 MG PO TABS: 50.0000 mg | ORAL_TABLET | Freq: Four times a day (QID) | ORAL | 0 refills | Status: DC | PRN

## 2017-10-02 NOTE — Discharge Summary (Signed)
Physician Discharge Summary   Patient ID: [name] MRN: [id number] DOB/AGE: Oct 14, 1940 77 y.o.  Admit date: 10/01/2017 Discharge date: 10/02/2017  Primary Diagnosis: Primary osteoarthritis left hip   Admission Diagnoses:  Past Medical History:  Diagnosis Date  . Anxiety    reports scotch helps that   . Arthritis    " it's everywhere"  . Blood transfusion without reported diagnosis   . Cataract    having cataract surgery July 2016  . Chronic kidney disease    hx kidney stones  . Colon polyps   . Complication of anesthesia    after surgery 07-2013 unable to sleep for 3 days  . GERD (gastroesophageal reflux disease)   . History of kidney stones   . Hyperlipidemia   . Hypertension   . Prostate hypertrophy   . 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 Seasonal allergies   . 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 Sleep apnea    had surgery in 2000 for sleep apnea, no CPAP needed   Discharge Diagnoses:   Principal Problem:   OA (osteoarthritis) of hip  Estimated body mass index is 29.98 kg/m as calculated from the following:   Height as of this encounter: '5\' 9"'  (1.753 m).   Weight as of this encounter: 92.1 kg (203 lb).  Procedure(s) (LRB): LEFT TOTAL HIP ARTHROPLASTY ANTERIOR APPROACH (Left)   Consults: None  HPI: [name], 77 y.o. male, has a history of pain and functional disability in the left hip(s) due to arthritis and patient has failed non-surgical conservative treatments for greater than 12 weeks to include NSAID's and/or analgesics, corticosteriod injections, flexibility and strengthening excercises and activity modification.  Onset of symptoms was gradual starting 3 years ago with gradually worsening course since that time.The patient noted no past surgery on the left hip(s).  Patient currently rates pain in the left hip at 8 out of 10 with activity. Patient has night  pain, worsening of pain with activity and weight bearing, pain that interfers with activities of daily living and pain with passive range of motion. Patient has evidence of subchondral cysts, periarticular osteophytes and joint space narrowing by imaging studies. This condition presents safety issues increasing the risk of falls. There is no current active infection.    Laboratory Data: Admission on 10/01/2017, Discharged on 10/02/2017  Component Date Value Ref Range Status  . WBC 10/02/2017 13.7* 4.0 - 10.5 K/uL Final  . RBC 10/02/2017 4.26  4.22 - 5.81 MIL/uL Final  . Hemoglobin 10/02/2017 13.0  13.0 - 17.0 g/dL Final  . HCT 10/02/2017 41.3  39.0 - 52.0 % Final  . MCV 10/02/2017 96.9  78.0 - 100.0 fL Final  . MCH 10/02/2017 30.5  26.0 - 34.0 pg Final  . MCHC 10/02/2017 31.5  30.0 - 36.0 g/dL Final  . RDW 10/02/2017 15.3  11.5 - 15.5 % Final  . Platelets 10/02/2017 146* 150 - 400 K/uL Final   Performed at [facility], [place] [address] [place], [place] [address]  . Sodium 10/02/2017 136  135 - 145 mmol/L Final  . Potassium 10/02/2017 4.1  3.5 - 5.1 mmol/L Final  . Chloride 10/02/2017 103  101 - 111 mmol/L Final  . CO2 10/02/2017 23  22 - 32 mmol/L Final  . Glucose, Bld 10/02/2017 113* 65 - 99 mg/dL Final  . BUN 10/02/2017 18  6 - 20 mg/dL Final  . Creatinine, Ser 10/02/2017 1.25* 0.61 - 1.24 mg/dL Final  . Calcium 10/02/2017 8.7* 8.9 - 10.3 mg/dL Final  . GFR calc non Af Amer 10/02/2017 55* >60 mL/min Final  . GFR calc Af Amer 10/02/2017 >60  >60 mL/min Final   Comment: (NOTE) The eGFR has been calculated using the CKD EPI equation. This calculation has not been validated in all clinical situations. eGFR's persistently <60 mL/min signify possible Chronic Kidney Disease.   Georgiann Hahn gap 10/02/2017 10  5 - 15 Final   Performed at [facility], [place] [address] [place], [place] [address]  Hospital Outpatient Visit on 09/25/2017  Component Date  Value Ref Range Status  . MRSA, PCR 09/25/2017 NEGATIVE  NEGATIVE Final  . Staphylococcus aureus 09/25/2017 POSITIVE* NEGATIVE Final   Comment: (NOTE) The Xpert SA Assay (FDA approved for NASAL specimens in patients 30 years of age and older), is one component of a comprehensive surveillance program. It is not intended to diagnose infection nor to guide or monitor treatment. Performed at [facility], [place] [address] [place], [place] [address]   . aPTT 09/25/2017 32  24 - 36 seconds Final   Performed at [facility], [place] [address] [place], [place] [address]  . Prothrombin Time 09/25/2017 16.8* 11.4 - 15.2 seconds Final  . INR 09/25/2017 1.38   Final   Performed at [facility], [place] [address], [place], [place] [address]  . ABO/RH(D) 09/25/2017 AB POS   Final  . Antibody Screen 09/25/2017 NEG   Final  . Sample Expiration 09/25/2017 10/04/2017   Final  . Extend sample reason 09/25/2017    Final                   Value:NO TRANSFUSIONS OR PREGNANCY IN THE PAST 3 MONTHS Performed at [facility], [place] [address] [place], [place] [address]   . ABO/RH(D) 09/25/2017    Final                   Value:AB POS Performed at [facility], [place] [address] [place], [place] [address]      X-Rays:Dg Pelvis Portable  Result Date: 10/01/2017 CLINICAL DATA:  Postop total hip replacement EXAM: PORTABLE PELVIS 1-2 VIEWS COMPARISON:  None. FINDINGS: Changes of left hip replacement. No evidence of hardware bony complicating feature. Soft tissue drain in place. IMPRESSION: Left hip replacement.  No visible complicating feature. Electronically Signed   By: Rolm Baptise M.D.   On: 10/01/2017 11:03   Dg C-arm 1-60 Min-no Report  Result Date: 10/01/2017 Fluoroscopy was utilized by the requesting physician.  No radiographic interpretation.    EKG: Orders placed or performed in visit on 09/25/17  . EKG 12-Lead       Hospital Course: Patient was admitted to [facility] and taken to the OR and underwent the above state procedure without complications.  Patient tolerated the procedure well and was later transferred to the recovery room and then to the orthopaedic floor for postoperative care.  They were given PO and IV analgesics for pain control following their surgery.  They were given 24 hours of postoperative antibiotics of  Anti-infectives (From admission, onward)   Start     Dose/Rate Route Frequency Ordered Stop   10/01/17 2000  vancomycin (VANCOCIN) IVPB 1000 mg/200 mL premix     1,000 mg 200 mL/hr over 60 Minutes Intravenous Every 12  hours 10/01/17 1356 10/01/17 2042   10/01/17 0630  vancomycin (VANCOCIN) IVPB 1000 mg/200 mL premix     1,000 mg 200 mL/hr over 60 Minutes Intravenous On call to O.R. 10/01/17 1884 10/01/17 0846     and started on DVT prophylaxis in the form of Eliquis.   PT and OT were ordered for total hip protocol.  The patient was allowed to be WBAT with therapy. Discharge planning was consulted to help with postop disposition and equipment needs.  Patient had a good night on the evening of surgery.  They started to get up OOB with therapy on day one.  Hemovac drain was pulled without difficulty.  The patient had progressed with therapy and meeting their goals.  Incision was healing well.  Patient was seen in rounds and was ready to go home.  Diet: Cardiac diet Activity:WBAT Follow-up:in 2 weeks Disposition - Home Discharged Condition: stable   Discharge Instructions    Call MD / Call 911   Complete by:  As directed    If you experience chest pain or shortness of breath, CALL 911 and be transported to the hospital emergency room.  If you develope a fever above 101 F, pus (white drainage) or increased drainage or redness at the wound, or calf pain, call your surgeon's office.   Constipation Prevention   Complete by:  As directed    Drink plenty of fluids.  Prune juice  may be helpful.  You may use a stool softener, such as Colace (over the counter) 100 mg twice a day.  Use MiraLax (over the counter) for constipation as needed.   Diet - low sodium heart healthy   Complete by:  As directed    Discharge instructions   Complete by:  As directed    Dr. Gaynelle Arabian Total Joint Specialist [facility] [address] [place], [place] [address] [phone]  ANTERIOR APPROACH TOTAL HIP REPLACEMENT POSTOPERATIVE DIRECTIONS   Hip Rehabilitation, Guidelines Following Surgery  The results of a hip operation are greatly improved after range of motion and muscle strengthening exercises. Follow all safety measures which are given to protect your hip. If any of these exercises cause increased pain or swelling in your joint, decrease the amount until you are comfortable again. Then slowly increase the exercises. Call your caregiver if you have problems or questions.   HOME CARE INSTRUCTIONS  Remove items at home which could result in a fall. This includes throw rugs or furniture in walking pathways.  ICE to the affected hip every three hours for 30 minutes at a time and then as needed for pain and swelling.  Continue to use ice on the hip for pain and swelling from surgery. You may notice swelling that will progress down to the foot and ankle.  This is normal after surgery.  Elevate the leg when you are not up walking on it.   Continue to use the breathing machine which will help keep your temperature down.  It is common for your temperature to cycle up and down following surgery, especially at night when you are not up moving around and exerting yourself.  The breathing machine keeps your lungs expanded and your temperature down.   DIET You may resume your previous home diet once your are discharged from the hospital.  DRESSING / WOUND CARE / SHOWERING You may change your dressing every day with sterile gauze.  Please use good hand washing techniques  before changing the dressing.  Do not  use any lotions or creams on the incision until instructed by your surgeon. You may start showering once you are discharged home but do not submerge the incision under water. Just pat the incision dry and apply a dry gauze dressing on daily. Change the surgical dressing daily and reapply a dry dressing each time.  ACTIVITY Walk with your walker as instructed. Use walker as long as suggested by your caregivers. Avoid periods of inactivity such as sitting longer than an hour when not asleep. This helps prevent blood clots.  You may resume a sexual relationship in one month or when given the OK by your doctor.  You may return to work once you are cleared by your doctor.  Do not drive a car for 6 weeks or until released by you surgeon.  Do not drive while taking narcotics.  WEIGHT BEARING Weight bearing as tolerated with assist device (walker, cane, etc) as directed, use it as long as suggested by your surgeon or therapist, typically at least 4-6 weeks.  POSTOPERATIVE CONSTIPATION PROTOCOL Constipation - defined medically as fewer than three stools per week and severe constipation as less than one stool per week.  One of the most common issues patients have following surgery is constipation.  Even if you have a regular bowel pattern at home, your normal regimen is likely to be disrupted due to multiple reasons following surgery.  Combination of anesthesia, postoperative narcotics, change in appetite and fluid intake all can affect your bowels.  In order to avoid complications following surgery, here are some recommendations in order to help you during your recovery period.  Colace (docusate) - Pick up an over-the-counter form of Colace or another stool softener and take twice a day as long as you are requiring postoperative pain medications.  Take with a full glass of water daily.  If you experience loose stools or diarrhea, hold the colace until you stool forms  back up.  If your symptoms do not get better within 1 week or if they get worse, check with your doctor.  Dulcolax (bisacodyl) - Pick up over-the-counter and take as directed by the product packaging as needed to assist with the movement of your bowels.  Take with a full glass of water.  Use this product as needed if not relieved by Colace only.   MiraLax (polyethylene glycol) - Pick up over-the-counter to have on hand.  MiraLax is a solution that will increase the amount of water in your bowels to assist with bowel movements.  Take as directed and can mix with a glass of water, juice, soda, coffee, or tea.  Take if you go more than two days without a movement. Do not use MiraLax more than once per day. Call your doctor if you are still constipated or irregular after using this medication for 7 days in a row.  If you continue to have problems with postoperative constipation, please contact the office for further assistance and recommendations.  If you experience "the worst abdominal pain ever" or develop nausea or vomiting, please contact the office immediatly for further recommendations for treatment.  ITCHING  If you experience itching with your medications, try taking only a single pain pill, or even half a pain pill at a time.  You can also use Benadryl over the counter for itching or also to help with sleep.   TED HOSE STOCKINGS Wear the elastic stockings on both legs for three weeks following surgery during the day but you may remove then at  night for sleeping.  MEDICATIONS See your medication summary on the "After Visit Summary" that the nursing staff will review with you prior to discharge.  You may have some home medications which will be placed on hold until you complete the course of blood thinner medication.  It is important for you to complete the blood thinner medication as prescribed by your surgeon.  Continue your approved medications as instructed at time of  discharge.  PRECAUTIONS If you experience chest pain or shortness of breath - call 911 immediately for transfer to the hospital emergency department.  If you develop a fever greater that 101 F, purulent drainage from wound, increased redness or drainage from wound, foul odor from the wound/dressing, or calf pain - CONTACT YOUR SURGEON.                                                   FOLLOW-UP APPOINTMENTS Make sure you keep all of your appointments after your operation with your surgeon and caregivers. You should call the office at the above phone number and make an appointment for approximately two weeks after the date of your surgery or on the date instructed by your surgeon outlined in the "After Visit Summary".  RANGE OF MOTION AND STRENGTHENING EXERCISES  These exercises are designed to help you keep full movement of your hip joint. Follow your caregiver's or physical therapist's instructions. Perform all exercises about fifteen times, three times per day or as directed. Exercise both hips, even if you have had only one joint replacement. These exercises can be done on a training (exercise) mat, on the floor, on a table or on a bed. Use whatever works the best and is most comfortable for you. Use music or television while you are exercising so that the exercises are a pleasant break in your day. This will make your life better with the exercises acting as a break in routine you can look forward to.  Lying on your back, slowly slide your foot toward your buttocks, raising your knee up off the floor. Then slowly slide your foot back down until your leg is straight again.  Lying on your back spread your legs as far apart as you can without causing discomfort.  Lying on your side, raise your upper leg and foot straight up from the floor as far as is comfortable. Slowly lower the leg and repeat.  Lying on your back, tighten up the muscle in the front of your thigh (quadriceps muscles). You can do  this by keeping your leg straight and trying to raise your heel off the floor. This helps strengthen the largest muscle supporting your knee.  Lying on your back, tighten up the muscles of your buttocks both with the legs straight and with the knee bent at a comfortable angle while keeping your heel on the floor.   IF YOU ARE TRANSFERRED TO A SKILLED REHAB FACILITY If the patient is transferred to a skilled rehab facility following release from the hospital, a list of the current medications will be sent to the facility for the patient to continue.  When discharged from the skilled rehab facility, please have the facility set up the patient's [place] prior to being released. Also, the skilled facility will be responsible for providing the patient with their medications at time of release from  the facility to include their pain medication, the muscle relaxants, and their blood thinner medication. If the patient is still at the rehab facility at time of the two week follow up appointment, the skilled rehab facility will also need to assist the patient in arranging follow up appointment in our office and any transportation needs.  MAKE SURE YOU:  Understand these instructions.  Get help right away if you are not doing well or get worse.    Pick up stool softner and laxative for home use following surgery while on pain medications. Do not submerge incision under water. Please use good hand washing techniques while changing dressing each day. May shower starting three days after surgery. Please use a clean towel to pat the incision dry following showers. Continue to use ice for pain and swelling after surgery. Do not use any lotions or creams on the incision until instructed by your surgeon.   Increase activity slowly as tolerated   Complete by:  As directed      Allergies as of 10/02/2017      Reactions   Cefaclor Other (See Comments)    skin split   Penicillins Rash   Has  patient had a PCN reaction causing immediate rash, facial/tongue/throat swelling, SOB or lightheadedness with hypotension:unsure Has patient had a PCN reaction causing severe rash involving mucus membranes or skin necrosis:unsure Has patient had a PCN reaction that required hospitalization:No Has patient had a PCN reaction occurring within the last 10 years:No--childhood allergy If all of the above answers are "NO", then may proceed with Cephalosporin use.      Medication List    TAKE these medications   acetaminophen 650 MG CR tablet Commonly known as:  TYLENOL Take 1,300 mg by mouth daily.   B-complex with vitamin C tablet Take 1 tablet by mouth daily.   cholecalciferol 1000 units tablet Commonly known as:  VITAMIN D Take 1,000 Units by mouth daily.   ELIQUIS 5 MG Tabs tablet Generic drug:  apixaban Take 5 mg by mouth 2 (two) times daily.   esomeprazole 40 MG capsule Commonly known as:  NEXIUM Take 40 mg by mouth daily as needed (acid reflux).   fenofibrate 145 MG tablet Commonly known as:  TRICOR Take 145 mg by mouth daily before breakfast.   fluticasone 50 MCG/ACT nasal spray Commonly known as:  FLONASE Place 2 sprays into both nostrils daily as needed for allergies or rhinitis.   Fluticasone-Salmeterol 100-50 MCG/DOSE Aepb Commonly known as:  ADVAIR Inhale 1 puff into the lungs 2 (two) times daily as needed (for respiratory issues.).   losartan-hydrochlorothiazide 100-12.5 MG tablet Commonly known as:  HYZAAR Take 1 tablet by mouth daily.   methocarbamol 500 MG tablet Commonly known as:  ROBAXIN Take 1 tablet (500 mg total) by mouth every 6 (six) hours as needed for muscle spasms.   oxyCODONE 5 MG immediate release tablet Commonly known as:  Oxy IR/ROXICODONE Take 1-2 tablets (5-10 mg total) by mouth every 4 (four) hours as needed for moderate pain or severe pain.   probenecid 500 MG tablet Commonly known as:  BENEMID Take 500 mg by mouth daily before  breakfast.   rosuvastatin 10 MG tablet Commonly known as:  CRESTOR Take 10 mg by mouth daily.   tamsulosin 0.4 MG Caps capsule Commonly known as:  FLOMAX Take 0.4 mg by mouth daily.   traMADol 50 MG tablet Commonly known as:  ULTRAM Take 1 tablet (50 mg total) by mouth every 6 (six)  hours as needed for moderate pain.      Follow-up Information    Gaynelle Arabian, MD. Schedule an appointment as soon as possible for a visit on 10/14/2017.   Specialty:  Orthopedic Surgery Contact information: [address] [place] [address] [phone]           Signed: Ardeen Jourdain, PA-C Orthopaedic Surgery 10/02/2017, 2:01 PM

## 2017-10-02 NOTE — Progress Notes (Signed)
   Subjective: 1 Day Post-Op Procedure(s) (LRB): LEFT TOTAL HIP ARTHROPLASTY ANTERIOR APPROACH (Left) Patient reports pain as moderate.   Patient seen in rounds with Dr. Wynelle Link. Patient is well, and has had no acute complaints or problems. Voiding and positive flatus.  We will continue with therapy today. Plan is to go Home after hospital stay.  Objective: Vital signs in last 24 hours: Temp:  [97.6 F (36.4 C)-98.4 F (36.9 C)] 98.4 F (36.9 C) (05/16 0625) Pulse Rate:  [80-94] 80 (05/16 0625) Resp:  [10-28] 20 (05/16 0625) BP: (74-130)/(54-89) 130/82 (05/16 0625) SpO2:  [93 %-100 %] 96 % (05/16 0625) Weight:  [92.1 kg (203 lb)] 92.1 kg (203 lb) (05/15 1344)  Intake/Output from previous day:  Intake/Output Summary (Last 24 hours) at 10/02/2017 0731 Last data filed at 10/02/2017 0615 Gross per 24 hour  Intake 3943.33 ml  Output 2510 ml  Net 1433.33 ml    Labs: Recent Labs    10/02/17 0532  HGB 13.0   Recent Labs    10/02/17 0532  WBC 13.7*  RBC 4.26  HCT 41.3  PLT 146*   Recent Labs    10/02/17 0532  NA 136  K 4.1  CL 103  CO2 23  BUN 18  CREATININE 1.25*  GLUCOSE 113*  CALCIUM 8.7*   EXAM General - Patient is Alert, Appropriate and Oriented Extremity - Neurovascular intact Sensation intact distally Intact pulses distally Dressing - dressing C/D/I Motor Function - intact, moving foot and toes well on exam.  Hemovac pulled without difficulty.  Past Medical History:  Diagnosis Date  . Anxiety    reports scotch helps that   . Arthritis    " it's everywhere"  . Blood transfusion without reported diagnosis   . Cataract    having cataract surgery July 2016  . Chronic kidney disease    hx kidney stones  . Colon polyps   . Complication of anesthesia    after surgery 07-2013 unable to sleep for 3 days  . GERD (gastroesophageal reflux disease)   . History of kidney stones   . Hyperlipidemia   . Hypertension   . Prostate hypertrophy   . Pulmonary  embolism ([place]) 12/2014   post TJR  . Seasonal allergies   . 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 Sleep apnea    had surgery in 2000 for sleep apnea, no CPAP needed    Assessment/Plan: 1 Day Post-Op Procedure(s) (LRB): LEFT TOTAL HIP ARTHROPLASTY ANTERIOR APPROACH (Left) Principal Problem:   OA (osteoarthritis) of hip  Estimated body mass index is 29.98 kg/m as calculated from the following:   Height as of this encounter: 5\' 9"  (1.753 m).   Weight as of this encounter: 92.1 kg (203 lb). Advance diet Up with therapy  DVT Prophylaxis - Eliquis Weight Bearing As Tolerated Hemovac Pulled Continue therapy today, plan for D/C to home with HEP this afternoon if meeting goals.  Arlee Muslim, PA-C Orthopaedic Surgery 10/02/2017, 7:31 AM

## 2017-10-02 NOTE — Progress Notes (Signed)
Physical Therapy Treatment Patient Details Name: [name] MRN: [id number] DOB: [birth year] Today's Date: 10/02/2017    History of Present Illness Pt s/p L THR and with hx of Bil TKR and R reverse TSR    PT Comments    Pt motivated and progressing with mobility - ltd by pain and fatigues easily.   Follow Up Recommendations  Follow surgeon's recommendation for DC plan and follow-up therapies     Equipment Recommendations  None recommended by PT    Recommendations for Other Services       Precautions / Restrictions Precautions Precautions: Fall Restrictions Weight Bearing Restrictions: No Other Position/Activity Restrictions: WBAT    Mobility  Bed Mobility Overal bed mobility: Needs Assistance Bed Mobility: Supine to Sit     Supine to sit: Min guard     General bed mobility comments: cues for sequence and use of R LE to self assist  Transfers Overall transfer level: Needs assistance Equipment used: Rolling walker (2 wheeled) Transfers: Sit to/from Stand Sit to Stand: Min guard         General transfer comment: cues for LE management and use of UEs to self assist  Ambulation/Gait Ambulation/Gait assistance: Min assist;Min guard Ambulation Distance (Feet): 100 Feet Assistive device: Rolling walker (2 wheeled) Gait Pattern/deviations: Decreased step length - right;Decreased step length - left;Shuffle;Trunk flexed;Step-to pattern;Step-through pattern Gait velocity: decr   General Gait Details: cues for sequence, posture and position from [employer] Mobility    Modified Rankin (Stroke Patients Only)       Balance                                            Cognition Arousal/Alertness: Awake/alert Behavior During Therapy: WFL for tasks assessed/performed Overall Cognitive Status: Within Functional Limits for tasks assessed                                        Exercises Total  Joint Exercises Ankle Circles/Pumps: AROM;Both;15 reps;Supine Quad Sets: AROM;Both;10 reps;Supine Heel Slides: AAROM;Left;20 reps;Supine Hip ABduction/ADduction: AAROM;Left;15 reps;Supine    General Comments        Pertinent Vitals/Pain Pain Assessment: 0-10 Pain Score: 6  Pain Location: L hip Pain Descriptors / Indicators: Aching;Sore Pain Intervention(s): Limited activity within patient's tolerance;Monitored during session;Premedicated before session;Ice applied    Home Living                      Prior Function            PT Goals (current goals can now be found in the care plan section) Acute Rehab PT Goals Patient Stated Goal: Regain IND  PT Goal Formulation: With patient Time For Goal Achievement: 10/08/17 Potential to Achieve Goals: Good Progress towards PT goals: Progressing toward goals    Frequency    7X/week      PT Plan Current plan remains appropriate    Co-evaluation              AM-PAC PT "6 Clicks" Daily Activity  Outcome Measure  Difficulty turning over in bed (including adjusting bedclothes, sheets and blankets)?: A Lot Difficulty moving from lying on back to sitting on the side of  the bed? : A Lot Difficulty sitting down on and standing up from a chair with arms (e.g., wheelchair, bedside commode, etc,.)?: A Lot Help needed moving to and from a bed to chair (including a wheelchair)?: A Little Help needed walking in hospital room?: A Little Help needed climbing 3-5 steps with a railing? : A Little 6 Click Score: 15    End of Session Equipment Utilized During Treatment: Gait belt Activity Tolerance: Patient tolerated treatment well Patient left: in chair;with call bell/phone within reach Nurse Communication: Mobility status PT Visit Diagnosis: Difficulty in walking, not elsewhere classified (R26.2)     Time: 1031-5945 PT Time Calculation (min) (ACUTE ONLY): 26 min  Charges:  $Gait Training: 8-22 mins $Therapeutic  Exercise: 8-22 mins                    G Codes:       Pg [phone]    [name] 10/02/2017, 8:36 AM

## 2017-10-02 NOTE — Progress Notes (Signed)
Reviewed discharge instructions, medications and patient states having no questions at this time.

## 2017-10-02 NOTE — Progress Notes (Signed)
Discharge planning, no HH needs identified. Plan for HEP, has DME. [phone]

## 2017-10-02 NOTE — Progress Notes (Signed)
Physical Therapy Treatment Patient Details Name: [name] MRN: [id number] DOB: [birth year] Today's Date: 10/02/2017    History of Present Illness Pt s/p L THR and with hx of Bil TKR and R reverse TSR    PT Comments    Pt progressing with mobility and eager for dc home.  Spouse present and reviewed car transfers, stairs and home therex program with progression and written instruction provided.   Follow Up Recommendations  Follow surgeon's recommendation for DC plan and follow-up therapies     Equipment Recommendations  None recommended by PT    Recommendations for Other Services       Precautions / Restrictions Precautions Precautions: Fall Restrictions Weight Bearing Restrictions: No Other Position/Activity Restrictions: WBAT    Mobility  Bed Mobility               General bed mobility comments: Pt up in chair and requests back to same  Transfers Overall transfer level: Needs assistance Equipment used: Rolling walker (2 wheeled) Transfers: Sit to/from Stand Sit to Stand: Min guard         General transfer comment: cues for LE management and use of UEs to self assist  Ambulation/Gait Ambulation/Gait assistance: Min guard;Supervision Ambulation Distance (Feet): 75 Feet Assistive device: Rolling walker (2 wheeled) Gait Pattern/deviations: Decreased step length - right;Decreased step length - left;Shuffle;Trunk flexed;Step-to pattern;Step-through pattern Gait velocity: decr   General Gait Details: min cues for posture and position from RW   Stairs Stairs: Yes Stairs assistance: Min guard Stair Management: Two rails;Step to pattern;Forwards Number of Stairs: 2 General stair comments: cues for sequence   Wheelchair Mobility    Modified Rankin (Stroke Patients Only)       Balance                                            Cognition Arousal/Alertness: Awake/alert Behavior During Therapy: WFL for tasks  assessed/performed Overall Cognitive Status: Within Functional Limits for tasks assessed                                        Exercises Total Joint Exercises Ankle Circles/Pumps: AROM;Both;15 reps;Supine Quad Sets: AROM;Both;10 reps;Supine Heel Slides: AAROM;Left;20 reps;Supine Hip ABduction/ADduction: AAROM;Left;15 reps;Supine Long Arc Quad: AROM;Left;10 reps;Seated    General Comments        Pertinent Vitals/Pain Pain Assessment: 0-10 Pain Score: 6  Pain Location: L hip Pain Descriptors / Indicators: Aching;Sore Pain Intervention(s): Limited activity within patient's tolerance;Monitored during session;Premedicated before session    Home Living                      Prior Function            PT Goals (current goals can now be found in the care plan section) Acute Rehab PT Goals Patient Stated Goal: Regain IND  PT Goal Formulation: With patient Time For Goal Achievement: 10/08/17 Potential to Achieve Goals: Good Progress towards PT goals: Progressing toward goals    Frequency    7X/week      PT Plan Current plan remains appropriate    Co-evaluation              AM-PAC PT "6 Clicks" Daily Activity  Outcome Measure  Difficulty turning over in bed (including adjusting  bedclothes, sheets and blankets)?: A Lot Difficulty moving from lying on back to sitting on the side of the bed? : A Lot Difficulty sitting down on and standing up from a chair with arms (e.g., wheelchair, bedside commode, etc,.)?: A Lot Help needed moving to and from a bed to chair (including a wheelchair)?: A Little Help needed walking in hospital room?: A Little Help needed climbing 3-5 steps with a railing? : A Little 6 Click Score: 15    End of Session Equipment Utilized During Treatment: Gait belt Activity Tolerance: Patient tolerated treatment well Patient left: in chair;with call bell/phone within reach Nurse Communication: Mobility status PT Visit  Diagnosis: Difficulty in walking, not elsewhere classified (R26.2)     Time: 9163-8466 PT Time Calculation (min) (ACUTE ONLY): 28 min  Charges:  $Gait Training: 8-22 mins $Therapeutic Exercise: 8-22 mins                    G Codes:       Pg [phone]    Yosgar Demirjian 10/02/2017, 12:36 PM

## 2017-10-03 ENCOUNTER — Telehealth: Payer: Self-pay | Admitting: *Deleted

## 2017-10-03 NOTE — Telephone Encounter (Signed)
LMOM for patient to call back- need to see if he is still on Eliquis.  Will need an appointment in the office prior to procedure if he is.

## 2017-10-03 NOTE — Telephone Encounter (Signed)
PT called back and told Trenton Gammon that he still on Eliquis.  Appt made for OV with Nevin Bloodgood on 10-21-17.  Colonoscopy appt still on the 19th- note made on OV that this is scheduled already.

## 2017-10-06 DIAGNOSIS — L814 Other melanin hyperpigmentation: Secondary | ICD-10-CM | POA: Diagnosis not present

## 2017-10-06 DIAGNOSIS — Z85828 Personal history of other malignant neoplasm of skin: Secondary | ICD-10-CM | POA: Diagnosis not present

## 2017-10-06 DIAGNOSIS — D1801 Hemangioma of skin and subcutaneous tissue: Secondary | ICD-10-CM | POA: Diagnosis not present

## 2017-10-06 DIAGNOSIS — L821 Other seborrheic keratosis: Secondary | ICD-10-CM | POA: Diagnosis not present

## 2017-10-06 DIAGNOSIS — L819 Disorder of pigmentation, unspecified: Secondary | ICD-10-CM | POA: Diagnosis not present

## 2017-10-06 DIAGNOSIS — Z85820 Personal history of malignant melanoma of skin: Secondary | ICD-10-CM | POA: Diagnosis not present

## 2017-10-06 DIAGNOSIS — D229 Melanocytic nevi, unspecified: Secondary | ICD-10-CM | POA: Diagnosis not present

## 2017-10-21 ENCOUNTER — Encounter: Payer: Self-pay | Admitting: Nurse Practitioner

## 2017-10-21 ENCOUNTER — Other Ambulatory Visit (INDEPENDENT_AMBULATORY_CARE_PROVIDER_SITE_OTHER): Payer: Medicare Other

## 2017-10-21 ENCOUNTER — Ambulatory Visit (INDEPENDENT_AMBULATORY_CARE_PROVIDER_SITE_OTHER): Payer: Medicare Other | Admitting: Nurse Practitioner

## 2017-10-21 ENCOUNTER — Telehealth: Payer: Self-pay

## 2017-10-21 VITALS — BP 94/58 | HR 97 | Ht 69.0 in | Wt 197.0 lb

## 2017-10-21 DIAGNOSIS — I6523 Occlusion and stenosis of bilateral carotid arteries: Secondary | ICD-10-CM

## 2017-10-21 DIAGNOSIS — R300 Dysuria: Secondary | ICD-10-CM | POA: Diagnosis not present

## 2017-10-21 DIAGNOSIS — Z86010 Personal history of colonic polyps: Secondary | ICD-10-CM

## 2017-10-21 LAB — URINALYSIS
Bilirubin Urine: NEGATIVE
Ketones, ur: NEGATIVE
LEUKOCYTES UA: NEGATIVE
NITRITE: NEGATIVE
PH: 6 (ref 5.0–8.0)
Specific Gravity, Urine: 1.01 (ref 1.000–1.030)
Total Protein, Urine: NEGATIVE
Urine Glucose: NEGATIVE
Urobilinogen, UA: 0.2 (ref 0.0–1.0)

## 2017-10-21 MED ORDER — NA SULFATE-K SULFATE-MG SULF 17.5-3.13-1.6 GM/177ML PO SOLN: ORAL | 0 refills | Status: DC

## 2017-10-21 NOTE — Patient Instructions (Addendum)
If you are age 75 or older, your body mass index should be between 23-30. Your Body mass index is 29.09 kg/m. If this is out of the aforementioned range listed, please consider follow up with your Primary Care Provider.  If you are age 53 or younger, your body mass index should be between 19-25. Your Body mass index is 29.09 kg/m. If this is out of the aformentioned range listed, please consider follow up with your Primary Care Provider.   You have been scheduled for a colonoscopy. Please follow written instructions given to you at your visit today.  Please pick up your prep supplies at the pharmacy within the next 1-3 days. If you use inhalers (even only as needed), please bring them with you on the day of your procedure. Your physician has requested that you go to www.startemmi.com and enter the access code given to you at your visit today. This web site gives a general overview about your procedure. However, you should still follow specific instructions given to you by our office regarding your preparation for the procedure.  We have sent the following medications to your pharmacy for you to pick up at your convenience: [facility] provider has requested that you go to the basement level for lab work before leaving today. Press "B" on the elevator. The lab is located at the first door on the left as you exit the elevator.   You will be contacted by our office prior to your procedure for directions on holding your Eliquis.  If you do not hear from our office 1 week prior to your scheduled procedure, please call [phone] to discuss.   Thank you for choosing me and [facility].   Tye Savoy, NP

## 2017-10-21 NOTE — Progress Notes (Signed)
      IMPRESSION and PLAN:    #76.  77 year old male with history of adenomatous colon polyps due for surveillance colonoscopy now.  Patient is already scheduled for the colonoscopy 11/02/2017 but needs to be seen as he takes Eliquis for hx of PE.  -The risks and benefits of colonoscopy with possible polypectomy were discussed and the patient agrees to proceed.  -Hold Eliquis for 2 days before procedure (Estimated creatinine clearance: 56.5 mL/min) - will instruct when and how to resume after procedure. Patient understands that there is a low but real risk of cardiovascular event such as heart attack, stroke, or embolism /  thrombosis while off blood thinner. The patient consents to proceed. Will communicate by phone or EMR with patient's prescribing provider to confirm that holding Eliquis is reasonable in this case.    #2.  Frequent urination / mild dysuria since urinary cath placed for surgery.  -check u/a and culture      HPI:    Chief Complaint: hx of colon polyps   Patient is a 77 yo male with multiple medical problems not limited to osteoarthritis, s/p recent left total hip arthroplasty, thoracic AA, and CKD.  Patient has history of adenomatous colon polyps and is due for surveillance colonoscopy now.  No GI complaints.  He has had occasional loose stools since starting a stool softener in preparation for recent hip surgery.  No blood in stool.  No abdominal pain.      Review of systems:     No chest pain, no SOB, no fevers   Past Medical History:  Diagnosis Date  . Anxiety    reports scotch helps that   . Arthritis    " it's everywhere"  . Blood transfusion without reported diagnosis   . Cataract    having cataract surgery July 2016  . Chronic kidney disease    hx kidney stones  . Colon polyps   . Complication of anesthesia    after surgery 07-2013 unable to sleep for 3 days  . GERD (gastroesophageal reflux disease)   . History of kidney stones   . Hyperlipidemia   .  Hypertension   . Prostate hypertrophy   . 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 Seasonal allergies   . 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 Sleep apnea    had surgery in 2000 for sleep apnea, no CPAP needed    Patient's surgical history, family medical history, social history, medications and allergies were all reviewed in Epic    Physical Exam:     BP (!) 94/58   Pulse 97   Ht 5\' 9"  (1.753 m)   Wt 197 lb (89.4 kg)   BMI 29.09 kg/m   GENERAL:  Pleasant male in NAD PSYCH: : Cooperative, normal affect EENT:  conjunctiva pink, mucous membranes moist, neck supple without masses CARDIAC:  RRR, no murmur heard, no peripheral edema PULM: Normal respiratory effort, lungs CTA bilaterally, no wheezing ABDOMEN:  Nondistended, soft, nontender. No obvious masses, no hepatomegaly,  normal bowel sounds SKIN:  turgor, no lesions seen Musculoskeletal:  Normal muscle tone, normal strength NEURO: Alert and oriented x 3, no focal neurologic deficits   Tye Savoy , NP 10/21/2017, 8:40 AM

## 2017-10-21 NOTE — Telephone Encounter (Signed)
[facility] [address] [place], [place]  [address] Phone:  [phone]   Fax:  [phone]  10/21/2017   RE:      NYLAN NEVEL DOB:   Apr 05, 1941 MRN:   [id number]   Dear Dr. Virgina Jock,    We have scheduled the above patient for an endoscopic procedure. Our records show that he is on anticoagulation therapy.   Please advise as to whether the patient may come off his therapy of Eliquis 2 days prior to the procedure, which is scheduled for Colonoscopy .  Please fax back/ or route the completed form to [facility] at [phone].   Sincerely,    Thurmon Fair, RMA

## 2017-10-21 NOTE — Progress Notes (Signed)
Agree with the note as outlined with the following changes: - GFR of 56.6 should warrant Eliquis being held for 3 days per ASGE guidelines, can you please make that change, if okay with primary provider. Will await results of colonoscopy.

## 2017-10-22 NOTE — Telephone Encounter (Signed)
Received anticoagulation letter back from Dr. Virgina Jock.  Per Dr. Virgina Jock okay to hold Eliquis 2 days prior to colonoscopy and resume when safe.  Spoke with patient regarding Dr. Keane Police instructions.  The patient verbalized understanding.  Letter scanned to chart.

## 2017-10-23 LAB — URINE CULTURE
MICRO NUMBER:: 90670549
SPECIMEN QUALITY:: ADEQUATE

## 2017-10-28 DIAGNOSIS — Z471 Aftercare following joint replacement surgery: Secondary | ICD-10-CM | POA: Diagnosis not present

## 2017-10-28 DIAGNOSIS — Z96642 Presence of left artificial hip joint: Secondary | ICD-10-CM | POA: Diagnosis not present

## 2017-11-04 ENCOUNTER — Telehealth: Payer: Self-pay

## 2017-11-04 NOTE — Telephone Encounter (Signed)
Patient advised okay to proceed with procedure since last Eliquis dose was Sunday at 8:30 am.

## 2017-11-04 NOTE — Telephone Encounter (Signed)
-----   Message from Yetta Flock, MD sent at 11/04/2017  1:10 PM EDT ----- Regarding: RE: Eliquis Yes if he's already held it 2 days and again today, I would continue to hold and go ahead with his procedure tomorrow. I will let him know when to resume it after the exam. Thanks  ----- Message ----- From: Lowell Guitar, RMA Sent: 11/04/2017  11:18 AM To: Yetta Flock, MD Subject: Eliquis                                         Dr. Havery Moros I forwarded this message to Dr. Virgina Jock on 10/29/17 but did not hear back.  I left a message for patient yesterday inquiring as to whether they had contacted him.  He called this morning stating that he did not hear from Dr. Virgina Jock either.  He states that he took Eliquis on Sunday but not yesterday or today.  Will he be able to have his procedure tomorrow?  I apologize, I should have caught this sooner.  Thank you, Peter Congo  ----- Message ----- From: Willia Craze, NP Sent: 10/28/2017   1:49 PM To: Lonell Face Morayati, RMA  Peter Congo, would you please change this to 3 days to hold Eliquis, not 2. His renal function is decreased.Thanks Sorry :(

## 2017-11-05 ENCOUNTER — Encounter: Payer: Self-pay | Admitting: Gastroenterology

## 2017-11-05 ENCOUNTER — Ambulatory Visit (AMBULATORY_SURGERY_CENTER): Payer: Medicare Other | Admitting: Gastroenterology

## 2017-11-05 VITALS — BP 138/88 | HR 74 | Temp 98.7°F | Resp 18 | Ht 69.0 in | Wt 197.0 lb

## 2017-11-05 DIAGNOSIS — D123 Benign neoplasm of transverse colon: Secondary | ICD-10-CM | POA: Diagnosis not present

## 2017-11-05 DIAGNOSIS — Z1211 Encounter for screening for malignant neoplasm of colon: Secondary | ICD-10-CM | POA: Diagnosis not present

## 2017-11-05 DIAGNOSIS — D122 Benign neoplasm of ascending colon: Secondary | ICD-10-CM

## 2017-11-05 DIAGNOSIS — Z86010 Personal history of colonic polyps: Secondary | ICD-10-CM | POA: Diagnosis not present

## 2017-11-05 MED ORDER — SODIUM CHLORIDE 0.9 % IV SOLN: 500.0000 mL | Freq: Once | INTRAVENOUS | Status: DC

## 2017-11-05 NOTE — Progress Notes (Signed)
A and O x3. Report to RN. Tolerated MAC anesthesia well.

## 2017-11-05 NOTE — Op Note (Signed)
[facility] Patient Name: [name] Procedure Date: 11/05/2017 8:01 AM MRN: [id number] Endoscopist: Remo Lipps P. Havery Moros , MD Age: 77 Referring MD:  Date of Birth: June 05, 1940 Gender: Male Account #: [id number] Procedure:                Colonoscopy Indications:              High risk colon cancer surveillance: Personal                            history of colonic polyps (history of large cecal                            polyp removed in piecemeal fashion a few years ago Medicines:                Monitored Anesthesia Care Procedure:                Pre-Anesthesia Assessment:                           - Prior to the procedure, a History and Physical                            was performed, and patient medications and                            allergies were reviewed. The patient's tolerance of                            previous anesthesia was also reviewed. The risks                            and benefits of the procedure and the sedation                            options and risks were discussed with the patient.                            All questions were answered, and informed consent                            was obtained. Prior Anticoagulants: The patient has                            taken Eliquis (apixaban), last dose was 3 days                            prior to procedure. ASA Grade Assessment: II - A                            patient with mild systemic disease. After reviewing                            the risks and benefits, the patient was deemed in  satisfactory condition to undergo the procedure.                           After obtaining informed consent, the colonoscope                            was passed under direct vision. Throughout the                            procedure, the patient's blood pressure, pulse, and                            oxygen saturations were monitored continuously. The   Colonoscope was introduced through the anus and                            advanced to the the cecum, identified by                            appendiceal orifice and ileocecal valve. The                            colonoscopy was performed without difficulty. The                            patient tolerated the procedure well. The quality                            of the bowel preparation was inadequate. The                            ileocecal valve, appendiceal orifice, and rectum                            were photographed. Scope In: 8:10:10 AM Scope Out: 8:21:28 AM Scope Withdrawal Time: 0 hours 7 minutes 59 seconds  Total Procedure Duration: 0 hours 11 minutes 18 seconds  Findings:                 The perianal and digital rectal examinations were                            normal.                           A moderate amount of semi-liquid stool was found in                            the entire colon, making visualization difficult.                            The prep was not adequate for screening purposes -                            small or flat polyps may not have been appreciated  on this exam.                           A 3 mm polyp was found in the ascending colon. The                            polyp was sessile. The polyp was removed with a                            cold snare. Resection and retrieval were complete.                           A 4 mm polyp was found in the transverse colon. The                            polyp was sessile. The polyp was removed with a                            cold snare. Resection and retrieval were complete.                           Multiple medium-mouthed diverticula were found in                            the left colon.                           Internal hemorrhoids were found during retroflexion.                           No large polypoid lesions or mass lesions, but                            small or  flat polyps may not have been appreciated                            given bowel prep. Complications:            No immediate complications. Estimated blood loss:                            Minimal. Estimated Blood Loss:     Estimated blood loss was minimal. Impression:               - Preparation of the colon was inadequate.                           - Stool in the entire examined colon.                           - One 3 mm polyp in the ascending colon, removed                            with a cold snare. Resected and retrieved.                           -  One 4 mm polyp in the transverse colon, removed                            with a cold snare. Resected and retrieved.                           - Diverticulosis in the left colon.                           - Internal hemorrhoids.                           Last colonoscopy also limited by prep. All future                            colonoscopy procedures should be double / 2 day prep Recommendation:           - Patient has a contact number available for                            emergencies. The signs and symptoms of potential                            delayed complications were discussed with the                            patient. Return to normal activities tomorrow.                            Written discharge instructions were provided to the                            patient.                           - Resume previous diet.                           - Continue present medications.                           - Resume Eliquis tomorrow night                           - Await pathology results.                           - Repeat colonoscopy within 6 months because the                            bowel preparation was suboptimal. Double prep is                            recommended. Remo Lipps P. Armbruster, MD 11/05/2017 8:28:29 AM This report has been signed electronically.

## 2017-11-05 NOTE — Progress Notes (Signed)
Called to room to assist during endoscopic procedure.  Patient ID and intended procedure confirmed with present staff. Received instructions for my participation in the procedure from the performing physician.  

## 2017-11-05 NOTE — Progress Notes (Signed)
Pt's states no medical or surgical changes since previsit or office visit. 

## 2017-11-05 NOTE — Patient Instructions (Addendum)
*  Handouts given on hemorrhoids, polyps and diverticulosis     YOU HAD AN ENDOSCOPIC PROCEDURE TODAY AT [facility]:   Refer to the procedure report that was given to you for any specific questions about what was found during the examination.  If the procedure report does not answer your questions, please call your gastroenterologist to clarify.  If you requested that your care partner not be given the details of your procedure findings, then the procedure report has been included in a sealed envelope for you to review at your convenience later.  YOU SHOULD EXPECT: Some feelings of bloating in the abdomen. Passage of more gas than usual.  Walking can help get rid of the air that was put into your GI tract during the procedure and reduce the bloating. If you had a lower endoscopy (such as a colonoscopy or flexible sigmoidoscopy) you may notice spotting of blood in your stool or on the toilet paper. If you underwent a bowel prep for your procedure, you may not have a normal bowel movement for a few days.  Please Note:  You might notice some irritation and congestion in your nose or some drainage.  This is from the oxygen used during your procedure.  There is no need for concern and it should clear up in a day or so.  SYMPTOMS TO REPORT IMMEDIATELY:   Following lower endoscopy (colonoscopy or flexible sigmoidoscopy):  Excessive amounts of blood in the stool  Significant tenderness or worsening of abdominal pains  Swelling of the abdomen that is new, acute  Fever of 100F or higher   For urgent or emergent issues, a gastroenterologist can be reached at any hour by calling [phone].   DIET:  We do recommend a small meal at first, but then you may proceed to your regular diet.  Drink plenty of fluids but you should avoid alcoholic beverages for 24 hours.  ACTIVITY:  You should plan to take it easy for the rest of today and you should NOT DRIVE or use heavy machinery  until tomorrow (because of the sedation medicines used during the test).    FOLLOW UP: Our staff will call the number listed on your records the next business day following your procedure to check on you and address any questions or concerns that you may have regarding the information given to you following your procedure. If we do not reach you, we will leave a message.  However, if you are feeling well and you are not experiencing any problems, there is no need to return our call.  We will assume that you have returned to your regular daily activities without incident.  If any biopsies were taken you will be contacted by phone or by letter within the next 1-3 weeks.  Please call us at [phone] if you have not heard about the biopsies in 3 weeks.    SIGNATURES/CONFIDENTIALITY: You and/or your care partner have signed paperwork which will be entered into your electronic medical record.  These signatures attest to the fact that that the information above on your After Visit Summary has been reviewed and is understood.  Full responsibility of the confidentiality of this discharge information lies with you and/or your care-partner.

## 2017-11-06 ENCOUNTER — Telehealth: Payer: Self-pay

## 2017-11-06 ENCOUNTER — Telehealth: Payer: Self-pay | Admitting: *Deleted

## 2017-11-06 NOTE — Telephone Encounter (Signed)
Left message on f/u call 

## 2017-11-06 NOTE — Telephone Encounter (Signed)
Second follow up call left a message.

## 2017-11-11 DIAGNOSIS — K1370 Unspecified lesions of oral mucosa: Secondary | ICD-10-CM | POA: Diagnosis not present

## 2017-11-11 DIAGNOSIS — C069 Malignant neoplasm of mouth, unspecified: Secondary | ICD-10-CM | POA: Diagnosis not present

## 2017-11-12 ENCOUNTER — Other Ambulatory Visit: Payer: Medicare Other

## 2017-11-12 ENCOUNTER — Encounter: Payer: Self-pay | Admitting: Internal Medicine

## 2017-11-12 ENCOUNTER — Ambulatory Visit (INDEPENDENT_AMBULATORY_CARE_PROVIDER_SITE_OTHER): Payer: Medicare Other | Admitting: Internal Medicine

## 2017-11-12 VITALS — BP 122/78 | HR 78 | Ht 69.0 in | Wt 197.4 lb

## 2017-11-12 DIAGNOSIS — Z122 Encounter for screening for malignant neoplasm of respiratory organs: Secondary | ICD-10-CM | POA: Diagnosis not present

## 2017-11-12 DIAGNOSIS — I6523 Occlusion and stenosis of bilateral carotid arteries: Secondary | ICD-10-CM | POA: Diagnosis not present

## 2017-11-12 DIAGNOSIS — Z86711 Personal history of pulmonary embolism: Secondary | ICD-10-CM | POA: Diagnosis not present

## 2017-11-12 DIAGNOSIS — J42 Unspecified chronic bronchitis: Secondary | ICD-10-CM

## 2017-11-12 DIAGNOSIS — Z129 Encounter for screening for malignant neoplasm, site unspecified: Secondary | ICD-10-CM | POA: Diagnosis not present

## 2017-11-12 DIAGNOSIS — F17210 Nicotine dependence, cigarettes, uncomplicated: Secondary | ICD-10-CM | POA: Diagnosis not present

## 2017-11-12 NOTE — Progress Notes (Signed)
Subjective:     Patient ID: [name], male   DOB: 12-23-1940, 77 y.o.   MRN: [id number]  HPI    IOV 01/04/2015  Chief Complaint  Patient presents with  . Pulmonary Consult    Pt referred by Dr. Shon Baton for PE. Pt denies SOB and CP/tightness. Pt c/o heomptysis, decreasing in blood volume that pt is coughing up.  Pt states the hemoptysis started on 8.8.16 with pain in right shoulder.     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 He had negative cardiac stress test in March 2016. In April 2016 had right total knee replacement. After that he has been pretty active. He denies any long trips. Around first week of August 2016 noticed left calf swelling that then improved. Then on 12/26/2014 started having right acute scapular pain that was pleuritic and severe associated with hemoptysis. This is per his history and also per review of referring physician's chart which echo the same information. Physical exam at primary care physician noticed bibasal crackles. He was subjected to CT angiography that showed bilateral saddle emboli. I do not have the visual images with me but I do have the report. The CT scan was done 12/27/2014 at [facility] on [address] [place]. Reports says but large bilateral pulmonary emboli in the distal main pulmonary arteries as well as lobar arteries. There is associated scarring and atelectasis. This opacity in the right middle lobe which could represent lung infarct or residual scarring. He was then started on ELIQUIS herapy as an outpatient. He was never on oxygen therapy. He was never hospitalized. Since then he significantly improved. Hemoptysis is resolved. Right infrascapular pain is also improved significantly. Currently he is feeling fine and is actively working. He denies any syncope orany problems. 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 Testosterone patch    OV 07/20/2015  Chief Complaint  Patient presents with  . Follow-up    Pt denies SOB, cough, and CP/tightness. Pt denies any current complaints at this time.     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 Several risk factors noted above.  He has had no further problems. No dyspnea. No bleeding issues. No hemoptysis no chest pain no wheezing no cough. He is no longer on the testosterone patch but wanted about restarting if he felt that it would be okay. Other risk factors of being overweight and smoking cigars continue. In addition over the summer he plans to go scuba diving in the [place]. He says that he will prepare himself to cut down risk of injury. He has been advised that he needs lifelong anticoagulation by his primary care physician. He also says that in the interim he has had several CT chest to follow-up resolution of the blood clot because he desired dose. He showed me one recent CT chest which I visualize personally and it looks like there is no blood clot. In addition Pulm parenchyma does not show any lung cancer.  OV 06/18/2016  Chief Complaint  Patient presents with  . Follow-up    Pt states he has had not breahting issues since last OV in 07/2015. Pt states he does have a prod cough with clear mucus. Pt denies SOB and  CP/tightness.   Follow-up for pulmonary embolism in August 2016. Risk factors documented below. THESE continues to be overweight and he continues to smoke. He also has bilateral varicose veins. He's no longer on the testosterone patch. He has not had any knee surgery. He did go to [place] and did scuba diving without any trauma. He plans to go scuba diving again in March 2018. He's not had any problems with eliquis and he does not mind continuing this even with scuba diving. He  is open to having d-dimer blood test. 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 He continues to smoke. He says his recent cardiology evaluation which was negative and his been cleared for 2 years. He finds it very difficult to quit  Cancer screening: He wants to get cancer screening of the lungs done. Last CT chest was 2016. He wants to get this done at [facility].   OV 02/03/2017  Chief Complaint  Patient presents with  . Follow-up    Pt states that he is currently on predinsone x10 days due to arthritis. Otherwise states he is doing good. States that he has had a dry cough x1 week due to sinus drainage. Denies any CP or SOB.    77 year old male with pulmonary embolism in August 2016. Presents for follow-up. In the interim in June 2017 in January 2018 his d-dimer was normal. However he still petrified about the risk of recurrent pulmonary embolism he is continued on full dose eiquis.  he even scuba dives [place] on full dose anticoagulation. I've advised him that during the time of scuba diving he can reduce or stop his anticoagulation but he is continued this without any trauma at full dose anticoagulation. Most recently went scuba diving a week ago approximately while in [place]. Otherwise is feeling felt there is no dyspnea. He continues to smoke which is a minor risk factor for his pulmonary embolism.    new finding of bilateral bibasal crackles. He did have a CT scan at an outside imaging center earlier this year that showed only biapical scarring. His never had HRCT. He denies any dyspnea or cough   OV  11/12/2017  Chief Complaint  Patient presents with  . Follow-up    Pt states he has been doing well since last visit. States he has had chronic bronchitis with light brown in color. Denies any worsening SOB or any CP.    Follow-up for  - History of pulmonary embolism August 2016. He continues on full dose eliquis was. He does not want to stop this. He went scuba diving in the [place]. He says that any physician about stopping this or reducing the dose will be taken by him and his primary care physician with my input. Last d-dimer check in 2017 and gender 2018 was normal. He is agreeable to having and repeat d-dimer check. His ongoing risk factors are smoking and varicose veins.  Smoking: He continues to smoke  Chronic bronchitis: He has periodic cough that he takes Advair as needed he is not inclined to change this  Right lower lobe crackles: We will  worried about interstitial lung disease. A CT chest in September 2018 and that shows just old bronchiectasis in the local area.   Lung cancer screening: He 77 has 50 pack ongoing smoking history and is eligible for lung cancer screening;. He is agreeable to having a low dose CT in 1 year in September 2019. We discussed the following about cancer screening  #lung cancer screening I discussed screening Ct chest for early detection of lung cancer Explained that in age 27-75 and smoking history, annual low dose CT chest can pick up lung cancer early and has potential to save lives and cure lung cancer This is similar in concept to screening mammogram, colonoscopies and pap smears I explained Ct scan is low dose radiation I explained early lung cancer asymptomatic and only way to  detect is CT  With the real advantage that early lung cancer is curable through radiation or surgery I explained CT superior to CXR I explained that false positives are present and can incur cost and workup like biopsies, additional scan but benefit outweighs risk I  recommend one a yeartill age 80  Results for AHMANI, PREHN (MRN [id number]) as of 02/03/2017 10:51  Ref. Range 11/02/2015 09:09 06/18/2016 09:42  D-Dimer, America Brown Latest Ref Range: <0.50 mcg/mL FEU 0.33 0.33       has a past medical history of Anxiety, Arthritis, Blood transfusion without reported diagnosis, Cataract, Chronic kidney disease, Colon polyps, Complication of anesthesia, GERD (gastroesophageal reflux disease), History of kidney stones, Hyperlipidemia, Hypertension, Prostate hypertrophy, Pulmonary embolism ([place]) (12/2014), Seasonal allergies, Skin cancer, and Sleep apnea.   reports that he has been smoking cigars.  He has smoked for the past 50.00 years. He has never used smokeless tobacco.  Past Surgical History:  Procedure Laterality Date  . CAROTID ENDARTERECTOMY Right Oct 01, 2013   CE  . COLONOSCOPY    . ENDARTERECTOMY Right 10/01/2013   Procedure: ENDARTERECTOMY CAROTID-RIGHT;  Surgeon: Rosetta Posner, MD;  Location: [facility];  Service: Vascular;  Laterality: Right;  . ESOPHAGOGASTRODUODENOSCOPY ENDOSCOPY  10-12-2013  . EXCISION ORAL LESION WITH CO2 LASER N/A 09/08/2015   Procedure: EXCISION ORAL LESION WITH CO2 LASER;  Surgeon: Jerrell Belfast, MD;  Location: [facility];  Service: ENT;  Laterality: N/A;  . EYE SURGERY Bilateral    cataracts, W/IOL  . JOINT REPLACEMENT Right September 02, 2014   Knee  . JOINT REPLACEMENT Left 2012   Knee  . PATCH ANGIOPLASTY Right 10/01/2013   Procedure: PATCH ANGIOPLASTY USING HEMASHIELD FINESSE PATCH;  Surgeon: Rosetta Posner, MD;  Location: [facility];  Service: Vascular;  Laterality: Right;  . removal of melonomia Left    shoulder  . REPLACEMENT TOTAL KNEE Left   . REVERSE SHOULDER ARTHROPLASTY Right 10/11/2016   Procedure: REVERSE RIGHT SHOULDER ARTHROPLASTY;  Surgeon: Netta Cedars, MD;  Location: [facility];  Service: Orthopedics;  Laterality: Right;  . RHINOPLASTY     1999  . RHINOPLASTY    . SKIN CANCER EXCISION     squamous cell cancer under tongue   . SKIN SURGERY     squamous cell carcinoma removed in various places,melanoma removed from L shoulder  . sleep apnea surgery  2000  . STERIOD INJECTION Left 10/11/2016   Procedure: STEROID INJECTION LEFT SHOULDER;  Surgeon: Netta Cedars, MD;  Location: [facility];  Service: Orthopedics;  Laterality: Left;  . TOTAL HIP ARTHROPLASTY Left 10/01/2017   Procedure: LEFT TOTAL HIP ARTHROPLASTY ANTERIOR APPROACH;  Surgeon: Gaynelle Arabian, MD;  [facility];  Service: Orthopedics;  Laterality: Left;  . TOTAL KNEE ARTHROPLASTY Right 09/02/2014   Procedure: RIGHT TOTAL KNEE ARTHROPLASTY;  Surgeon: Netta Cedars, MD;  Location: [facility];  Service: Orthopedics;  Laterality: Right;    Allergies  Allergen Reactions  . Cefaclor Other (See Comments)     skin split  . Penicillins Rash    Has patient had a PCN reaction causing immediate rash, facial/tongue/throat swelling, SOB or lightheadedness with hypotension:unsure Has patient had a PCN reaction causing severe rash involving mucus membranes or skin necrosis:unsure Has patient had a PCN reaction that required hospitalization:No Has patient had a PCN reaction occurring within the last 10 years:No--childhood allergy If all of the above answers are "NO", then may proceed with Cephalosporin use.     Immunization History  Administered Date(s) Administered  . Influenza Split 02/17/2014  . Influenza,inj,Quad PF,6+ Mos 01/19/2015, 02/18/2016  . Influenza,inj,quad, With Preservative 02/17/2017  . Pneumococcal-Unspecified 05/20/2012, 02/17/2017  . Zoster 05/20/2012    Family History  Problem Relation Age of Onset  . Diverticulosis Mother   . Skin cancer Mother   . Varicose Veins Mother   . Hyperlipidemia Father   . Heart disease Father   . Heart attack Father   . Colon cancer Neg Hx   . Esophageal cancer Neg Hx   . Rectal cancer Neg Hx   . Stomach cancer Neg Hx      Current Outpatient Medications:  .  acetaminophen (TYLENOL) 650 MG CR tablet, Take  1,300 mg by mouth daily. , Disp: , Rfl:  .  apixaban (ELIQUIS) 5 MG TABS tablet, Take 5 mg by mouth 2 (two) times daily. , Disp: , Rfl:  .  B Complex-C (B-COMPLEX WITH VITAMIN C) tablet, Take 1 tablet by mouth daily., Disp: , Rfl:  .  cholecalciferol (VITAMIN D) 1000 units tablet, Take 1,000 Units by mouth daily., Disp: , Rfl:  .  esomeprazole (NEXIUM) 40 MG capsule, Take 40 mg by mouth daily as needed (acid reflux). , Disp: , Rfl:  .  fenofibrate (TRICOR) 145 MG tablet, Take 145 mg by mouth daily before breakfast. , Disp: , Rfl:  .  fluticasone (FLONASE) 50 MCG/ACT nasal spray, Place 2 sprays into both nostrils daily as needed for allergies or rhinitis., Disp: , Rfl:  .  Fluticasone-Salmeterol (ADVAIR) 100-50 MCG/DOSE AEPB, Inhale 1 puff into the lungs 2 (two) times daily as needed (for respiratory issues.)., Disp: , Rfl:  .  losartan-hydrochlorothiazide (HYZAAR) 100-12.5 MG tablet, Take 1 tablet by mouth daily., Disp: , Rfl:  .  Omega-3 Fatty Acids (FISH OIL) 1000 MG CAPS, Take 1,000 mg by mouth daily., Disp: , Rfl:  .  probenecid (BENEMID) 500 MG tablet, Take 500 mg by mouth daily before breakfast. , Disp: , Rfl:  .  rosuvastatin (CRESTOR) 10 MG tablet, Take 10 mg by mouth daily., Disp: , Rfl:  .  tamsulosin (FLOMAX) 0.4 MG CAPS capsule, Take 0.4 mg by mouth daily. , Disp: , Rfl:   Current Facility-Administered Medications:  .  0.9 %  sodium chloride infusion, 500 mL, Intravenous, Once, Armbruster, Carlota Raspberry, MD   Review of Systems     Objective:   Physical Exam Today's Vitals   11/12/17 1158  BP: 122/78  Pulse: 78  SpO2: 98%  Weight: 197 lb 6.4 oz (89.5 kg)  Height: 5\' 9"  (1.753 m)    Estimated body mass index is 29.15 kg/m as calculated from the following:   Height as of this encounter: 5\' 9"  (  1.753 m).   Weight as of this encounter: 197 lb 6.4 oz (89.5 kg).      Assessment:       ICD-10-CM   1. History of pulmonary embolism Z86.711   2. Smoking greater than 40 pack  years F17.210   3. Screening for cancer Z12.9   4. Chronic bronchitis, unspecified chronic bronchitis type ([place]) J42        Plan:     History of pulmonary embolism -  3 years complet this august 2019 complete on full dose eliquis - June 2017 and jan 2018 d-dimer normal  -ongoing risk factor for PE is smoking and varicose veints (minor) - check d-dimer 11/12/2017 -  and if normal we can reduce to eliquis 5mg  once daily as best balance between prevention and bleeding risk  - I can pass results to Shon Baton, MD so you and he can make a decision  Smoking and cancer screen - do low dose CT chest wo contrast in Oct 2019; will call with results - try to quit smoking  Chronic bronchitis  - continue advair as needed  Followup  -12 months or sooer   Dr. Brand Males, M.D., [facility].C.P Pulmonary and Critical Care Medicine Staff Physician, [facility] Director - Interstitial Lung Disease  Program  Pulmonary [place] [facility] [place], [place], [address]  Pager: [phone], If no answer or between  15:00h - 7:00h: call [phone] Telephone: [phone]

## 2017-11-12 NOTE — Patient Instructions (Addendum)
History of pulmonary embolism -  3 years complet this august 2019 complete on full dose eliquis - June 2017 and jan 2018 d-dimer normal  -ongoing risk factor for PE is smoking and varicose veints (minor) - check d-dimer 11/12/2017 -  and if normal we can reduce to eliquis 5mg  once daily as best balance between prevention and bleeding risk  - I can pass results to Shon Baton, MD so you and he can make a decision  Smoking and cancer screen - do low dose CT chest wo contrast in Oct 2019; will call with results - try to quit smoking  Chronic bronchitis  - continue advair as needed  Followup  -12 months or sooer

## 2017-11-12 NOTE — Addendum Note (Signed)
Addended by: Lorretta Harp on: 11/12/2017 12:48 PM   Modules accepted: Orders

## 2017-11-13 ENCOUNTER — Encounter: Payer: Self-pay | Admitting: Gastroenterology

## 2017-11-13 ENCOUNTER — Telehealth: Payer: Self-pay | Admitting: Internal Medicine

## 2017-11-13 LAB — D-DIMER, QUANTITATIVE (NOT AT ARMC): D DIMER QUANT: 0.64 ug{FEU}/mL — AB (ref <0.50)

## 2017-11-13 NOTE — Telephone Encounter (Signed)
D-dimer high - so he should continue eliquis for another year atleast and then reassess. Please send this and yesterday note and result to PCP Shon Baton, MD and let patient know

## 2017-11-13 NOTE — Telephone Encounter (Signed)
Called and spoke with pt letting him know the results of the d-dimer labwork and because of it being elevated, he needs to continue taking the Eliquis for now at least another year and we could reassess at next OV.  Pt expressed understanding. Nothing further needed.

## 2018-01-27 DIAGNOSIS — C069 Malignant neoplasm of mouth, unspecified: Secondary | ICD-10-CM | POA: Diagnosis not present

## 2018-01-27 DIAGNOSIS — J302 Other seasonal allergic rhinitis: Secondary | ICD-10-CM | POA: Diagnosis not present

## 2018-02-13 DIAGNOSIS — Z85820 Personal history of malignant melanoma of skin: Secondary | ICD-10-CM | POA: Diagnosis not present

## 2018-02-13 DIAGNOSIS — L819 Disorder of pigmentation, unspecified: Secondary | ICD-10-CM | POA: Diagnosis not present

## 2018-02-13 DIAGNOSIS — Z85828 Personal history of other malignant neoplasm of skin: Secondary | ICD-10-CM | POA: Diagnosis not present

## 2018-02-13 DIAGNOSIS — L570 Actinic keratosis: Secondary | ICD-10-CM | POA: Diagnosis not present

## 2018-02-21 DIAGNOSIS — Z23 Encounter for immunization: Secondary | ICD-10-CM | POA: Diagnosis not present

## 2018-03-12 DIAGNOSIS — C44729 Squamous cell carcinoma of skin of left lower limb, including hip: Secondary | ICD-10-CM | POA: Diagnosis not present

## 2018-03-12 DIAGNOSIS — D485 Neoplasm of uncertain behavior of skin: Secondary | ICD-10-CM | POA: Diagnosis not present

## 2018-03-16 ENCOUNTER — Ambulatory Visit (INDEPENDENT_AMBULATORY_CARE_PROVIDER_SITE_OTHER)
Admission: RE | Admit: 2018-03-16 | Discharge: 2018-03-16 | Disposition: A | Discharge status: Home / Self Care | Payer: Medicare Other | Source: Ambulatory Visit | Attending: Internal Medicine | Admitting: Internal Medicine

## 2018-03-16 DIAGNOSIS — Z122 Encounter for screening for malignant neoplasm of respiratory organs: Secondary | ICD-10-CM

## 2018-03-16 DIAGNOSIS — F17210 Nicotine dependence, cigarettes, uncomplicated: Secondary | ICD-10-CM

## 2018-03-17 DIAGNOSIS — M25512 Pain in left shoulder: Secondary | ICD-10-CM | POA: Diagnosis not present

## 2018-03-17 DIAGNOSIS — M19012 Primary osteoarthritis, left shoulder: Secondary | ICD-10-CM | POA: Diagnosis not present

## 2018-03-19 ENCOUNTER — Telehealth: Payer: Self-pay | Admitting: Internal Medicine

## 2018-03-19 DIAGNOSIS — I700 Atherosclerosis of aorta: Secondary | ICD-10-CM

## 2018-03-19 DIAGNOSIS — C44729 Squamous cell carcinoma of skin of left lower limb, including hip: Secondary | ICD-10-CM | POA: Diagnosis not present

## 2018-03-19 NOTE — Telephone Encounter (Signed)
Called and spoke to pt, who is requesting CT results from 03/16/18.  MR please advise. Thanks

## 2018-03-20 NOTE — Telephone Encounter (Signed)
I fstress test was 5-6 years ago and no current chest pain he should be ok but given diving activities might not be a bad idea to revisit with his cardiologist / refer cards

## 2018-03-20 NOTE — Telephone Encounter (Signed)
Spoke with the pt and notified of results  He states has not seen cards  He has had a chemical stress test- maybe 5-6 years ago before he had a hip replacement  Do you want to refer to cards? Please advise, thanks

## 2018-03-20 NOTE — Telephone Encounter (Signed)
Called and spoke with pt letting him know the information stated per MR in regards to go ahead and refer him to cards.  Pt expressed understanding. Order has been placed. Nothing further needed.

## 2018-03-20 NOTE — Telephone Encounter (Signed)
IMPRESSION: 1. Lung-RADS 1, negative for nodule. Continue annual screening with low-dose chest CT without contrast in 12 months. - good news 2.  Emphysema (ICD10-J43.9).- contine inhalrs 3. Aortic atherosclerosis (ICD10-170.0). Three-vessel coronary artery calcification.- has he ever  Had a negative stress test in last 5 years or in active followup with cardiolgyu? 4. 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 Recommend annual imaging followup by CTA or MRA. This recommendation follows 2010 ACCF/AHA/AATS/ACR/ASA/SCA/SCAI/SIR/STS/SVM Guidelines for the Diagnosis and Management of Patients with Thoracic Aortic Disease. - pcp issue Circulation. 2010; 121: V223-C097. 5. Bilateral adrenal adenomas.- pcp issue   Electronically Signed   By: Lorin Picket M.D.   On: 03/16/2018 15:12

## 2018-03-26 DIAGNOSIS — K769 Liver disease, unspecified: Secondary | ICD-10-CM | POA: Insufficient documentation

## 2018-03-26 DIAGNOSIS — D3500 Benign neoplasm of unspecified adrenal gland: Secondary | ICD-10-CM | POA: Insufficient documentation

## 2018-04-01 NOTE — Progress Notes (Signed)
Cardiology Office Note   Date:  04/02/2018   ID:  Derek, Bender 08-Feb-1941, MRN [id number]  PCP:  Shon Baton, MD  Cardiologist:   Skeet Latch, MD   Chief Complaint  Patient presents with  . Loss of Consciousness     History of Present Illness: [name] is a 77 y.o. male with carotid stenosis status post right CEA, hypertension, emphysema, prior PE, multiple skin cancers, and GI bleed who is being seen today for the evaluation of coronary calcification at the request of Shon Baton, MDMr. Otterson had a saddle pulmonary embolism 12/2014.  This occurred in the setting of LE DVT.  He had a follow up chest CT 03/16/18 that revealed atherosclerosis of the aorta and all three coronary vessels in the left main.  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  [name] has been feeling well.  He has no chest pain and his breathing has been stable.  He does not get much formal exercise but is an avid scuba diver.  He wonders how the finding of coronary artery calcification will his affect his ability to scuba dive.  He no longer does deep dives.  He had his hip replaced in March and has severe arthritis throughout, and therefore does not do much activity that requires weightbearing.  He has some left ankle edema that he attributes to his arthritis but otherwise denies edema.  He denies orthopnea or PND.  He has not experienced any lightheadedness, dizziness, or syncope.   Past Medical History:  Diagnosis Date  . Anxiety    reports scotch helps that   . Arthritis    " it's everywhere"  . Blood transfusion without reported diagnosis   . Cataract    having cataract surgery July 2016  . Chronic kidney disease    hx kidney stones  . Colon polyps   . Complication of anesthesia    after surgery 07-2013 unable to sleep for 3 days  . GERD (gastroesophageal reflux disease)   . History of kidney stones   . Hyperlipidemia   . Hypertension   . Prostate hypertrophy   .  Pulmonary embolism ([place]) 12/2014   post TJR  . Seasonal allergies   . 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 Sleep apnea    had surgery in 2000 for sleep apnea, no CPAP needed    Past Surgical History:  Procedure Laterality Date  . CAROTID ENDARTERECTOMY Right Oct 01, 2013   CE  . COLONOSCOPY    . ENDARTERECTOMY Right 10/01/2013   Procedure: ENDARTERECTOMY CAROTID-RIGHT;  Surgeon: Rosetta Posner, MD;  Location: [place];  Service: Vascular;  Laterality: Right;  . ESOPHAGOGASTRODUODENOSCOPY ENDOSCOPY  10-12-2013  . EXCISION ORAL LESION WITH CO2 LASER N/A 09/08/2015   Procedure: EXCISION ORAL LESION WITH CO2 LASER;  Surgeon: Jerrell Belfast, MD;  Location: [place];  Service: ENT;  Laterality: N/A;  . EYE SURGERY Bilateral    cataracts, W/IOL  . JOINT REPLACEMENT Right September 02, 2014   Knee  . JOINT REPLACEMENT Left 2012   Knee  . PATCH ANGIOPLASTY Right 10/01/2013   Procedure: PATCH ANGIOPLASTY USING HEMASHIELD FINESSE PATCH;  Surgeon: Rosetta Posner, MD;  Location: [place];  Service: Vascular;  Laterality: Right;  . removal of melonomia Left    shoulder  . REPLACEMENT TOTAL KNEE Left   .  REVERSE SHOULDER ARTHROPLASTY Right 10/11/2016   Procedure: REVERSE RIGHT SHOULDER ARTHROPLASTY;  Surgeon: Netta Cedars, MD;  Location: [place];  Service: Orthopedics;  Laterality: Right;  . RHINOPLASTY     1999  . RHINOPLASTY    . SKIN CANCER EXCISION     squamous cell cancer under tongue  . SKIN SURGERY     squamous cell carcinoma removed in various places,melanoma removed from L shoulder  . sleep apnea surgery  2000  . STERIOD INJECTION Left 10/11/2016   Procedure: STEROID INJECTION LEFT SHOULDER;  Surgeon: Netta Cedars, MD;  Location: [place];  Service: Orthopedics;  Laterality: Left;  . TOTAL HIP ARTHROPLASTY Left 10/01/2017   Procedure: LEFT TOTAL HIP ARTHROPLASTY ANTERIOR APPROACH;  Surgeon: Gaynelle Arabian, MD;  Location:  [facility];  Service: Orthopedics;  Laterality: Left;  . TOTAL KNEE ARTHROPLASTY Right 09/02/2014   Procedure: RIGHT TOTAL KNEE ARTHROPLASTY;  Surgeon: Netta Cedars, MD;  Location: [place];  Service: Orthopedics;  Laterality: Right;     Current Outpatient Medications  Medication Sig Dispense Refill  . acetaminophen (TYLENOL) 650 MG CR tablet Take 1,300 mg by mouth daily.     Marland Kitchen apixaban (ELIQUIS) 5 MG TABS tablet Take 5 mg by mouth 2 (two) times daily.     . B Complex-C (B-COMPLEX WITH VITAMIN C) tablet Take 1 tablet by mouth daily.    . cholecalciferol (VITAMIN D) 1000 units tablet Take 1,000 Units by mouth daily.    Marland Kitchen esomeprazole (NEXIUM) 40 MG capsule Take 40 mg by mouth daily as needed (acid reflux).     . fenofibrate (TRICOR) 145 MG tablet Take 145 mg by mouth daily before breakfast.     . fluticasone (FLONASE) 50 MCG/ACT nasal spray Place 2 sprays into both nostrils daily as needed for allergies or rhinitis.    . Fluticasone-Salmeterol (ADVAIR) 100-50 MCG/DOSE AEPB Inhale 1 puff into the lungs 2 (two) times daily as needed (for respiratory issues.).    Marland Kitchen losartan-hydrochlorothiazide (HYZAAR) 100-12.5 MG tablet Take 1 tablet by mouth daily.    . Omega-3 Fatty Acids (FISH OIL) 1000 MG CAPS Take 1,000 mg by mouth daily.    . probenecid (BENEMID) 500 MG tablet Take 500 mg by mouth daily before breakfast.     . rosuvastatin (CRESTOR) 10 MG tablet Take 10 mg by mouth daily.    . sildenafil (VIAGRA) 25 MG tablet Take 25 mg by mouth daily as needed for erectile dysfunction.    . tamsulosin (FLOMAX) 0.4 MG CAPS capsule Take 0.4 mg by mouth daily.      Current Facility-Administered Medications  Medication Dose Route Frequency Provider Last Rate Last Dose  . 0.9 %  sodium chloride infusion  500 mL Intravenous Once Armbruster, Carlota Raspberry, MD        Allergies:   Cefaclor and Penicillins    Social History:  The patient  reports that he has been smoking cigars. He has smoked for the past 50.00 years. He  has never used smokeless tobacco. He reports that he drinks about 21.0 standard drinks of alcohol per week. He reports that he does not use drugs.   Family History:  The patient's family history includes Diverticulosis in his mother; Heart attack in his father and mother; Heart disease in his father; Hyperlipidemia in his father; Skin cancer in his mother; Varicose Veins in his mother.    ROS:  Please see the history of present illness.   Otherwise, review of systems are positive for none.  All other systems are reviewed and negative.    PHYSICAL EXAM: VS:  BP 129/84   Pulse 87   Resp 16   Ht 5\' 9"  (1.753 m)   Wt 204 lb 6.4 oz (92.7 kg)   SpO2 96%   BMI 30.18 kg/m  , BMI Body mass index is 30.18 kg/m. GENERAL:  Well appearing HEENT:  Pupils equal round and reactive, fundi not visualized, oral mucosa unremarkable NECK:  No jugular venous distention, waveform within normal limits, carotid upstroke brisk and symmetric, no bruits, no thyromegaly LYMPHATICS:  No cervical adenopathy LUNGS:  Clear to auscultation bilaterally HEART:  RRR.  PMI not displaced or sustained,S1 and S2 within normal limits, no S3, no S4, no clicks, no rubs, II/VI systolic murmur at the LUSB ABD:  Flat, positive bowel sounds normal in frequency in pitch, no bruits, no rebound, no guarding, no midline pulsatile mass, no hepatomegaly, no splenomegaly EXT:  2 plus pulses throughout, no edema, no cyanosis no clubbing SKIN:  No rashes no nodules NEURO:  Cranial nerves II through XII grossly intact, motor grossly intact throughout PSYCH:  Cognitively intact, oriented to person place and time   EKG:  EKG is ordered today. The ekg ordered today demonstrates sinus rhythm.  Rate 90 bpm.  PAC.   Recent Labs: 10/02/2017: BUN 18; Creatinine, Ser 1.25; Hemoglobin 13.0; Platelets 146; Potassium 4.1; Sodium 136    Lipid Panel No results found for: CHOL, TRIG, HDL, CHOLHDL, VLDL, LDLCALC, LDLDIRECT    Wt Readings from  Last 3 Encounters:  04/02/18 204 lb 6.4 oz (92.7 kg)  11/12/17 197 lb 6.4 oz (89.5 kg)  11/05/17 197 lb (89.4 kg)      ASSESSMENT AND PLAN:  # Coronary calcifications:  Mr. Casillas does not have any symptoms of angina.  He is somewhat active, mostly with scuba diving but does not get much formal exercise.  He is limited by his arthritis.  We will get a Lexiscan Myoview to assess for obstructive disease.  Given that he is already on Eliquis we will not start aspirin.  Lipids are very well-controlled.  LDL was 51 on 08/2017.  Continue omega-3, rosuvastatin, and fenofibrate.  # Tobacco: Patient is not interested in stopping his cigars.  Cessation was advised.  #Hypertension: Blood pressure well-controlled.  Continue losartan/HCTZ.  Would consider adding a beta 1 selective beta-blocker if needed in the future.  #Systolic murmur: Mr. Bernardini reports that he has always had a murmur.  He does have a systolic murmur that could be concerning for mild aortic stenosis.  We will get an echocardiogram to assess.   Current medicines are reviewed at length with the patient today.  The patient does not have concerns regarding medicines.  The following changes have been made:  no change  Labs/ tests ordered today include:   Orders Placed This Encounter  Procedures  . MYOCARDIAL PERFUSION IMAGING  . EKG 12-Lead  . ECHOCARDIOGRAM COMPLETE     Disposition:   FU with Sherilee Smotherman C. Oval Linsey, MD, [facility] in 3 months.    Signed, Katelinn Justice C. Oval Linsey, MD, [facility]  04/02/2018 5:32 PM    [place]

## 2018-04-02 ENCOUNTER — Ambulatory Visit (INDEPENDENT_AMBULATORY_CARE_PROVIDER_SITE_OTHER): Payer: Medicare Other | Admitting: Cardiovascular Disease

## 2018-04-02 ENCOUNTER — Encounter: Payer: Self-pay | Admitting: Cardiovascular Disease

## 2018-04-02 VITALS — BP 129/84 | HR 87 | Resp 16 | Ht 69.0 in | Wt 204.4 lb

## 2018-04-02 DIAGNOSIS — E7800 Pure hypercholesterolemia, unspecified: Secondary | ICD-10-CM | POA: Diagnosis not present

## 2018-04-02 DIAGNOSIS — I10 Essential (primary) hypertension: Secondary | ICD-10-CM

## 2018-04-02 DIAGNOSIS — R011 Cardiac murmur, unspecified: Secondary | ICD-10-CM

## 2018-04-02 DIAGNOSIS — I2584 Coronary atherosclerosis due to calcified coronary lesion: Secondary | ICD-10-CM | POA: Diagnosis not present

## 2018-04-02 DIAGNOSIS — R0602 Shortness of breath: Secondary | ICD-10-CM | POA: Diagnosis not present

## 2018-04-02 DIAGNOSIS — I2510 Atherosclerotic heart disease of native coronary artery without angina pectoris: Secondary | ICD-10-CM | POA: Diagnosis not present

## 2018-04-02 NOTE — Patient Instructions (Addendum)
Medication Instructions:  Your physician recommends that you continue on your current medications as directed. Please refer to the Current Medication list given to you today.  If you need a refill on your cardiac medications before your next appointment, please call your pharmacy.   Lab work: NONE  Testing/Procedures: Your physician has requested that you have an echocardiogram. Echocardiography is a painless test that uses sound waves to create images of your heart. It provides your doctor with information about the size and shape of your heart and how well your heart's chambers and valves are working. This procedure takes approximately one hour. There are no restrictions for this procedure. [address]  Your physician has requested that you have a lexiscan myoview. For further information please visit [email or web address]. Please follow instruction sheet, as given.  Follow-Up: At [facility], you and your health needs are our priority.  As part of our continuing mission to provide you with exceptional heart care, we have created designated Provider Care Teams.  These Care Teams include your primary Cardiologist (physician) and Advanced Practice Providers (APPs -  Physician Assistants and Nurse Practitioners) who all work together to provide you with the care you need, when you need it. You will need a follow up appointment in 3 months.  You may see DR [facility]  or one of the following Advanced Practice Providers on your designated Care Team:   Kerin Ransom, PA-C Roby Lofts, [place] . Sande Rives, PA-C  Any Other Special Instructions Will Be Listed Below (If Applicable).  Echocardiogram An echocardiogram, or echocardiography, uses sound waves (ultrasound) to produce an image of your heart. The echocardiogram is simple, painless, obtained within a short period of time, and offers valuable information to your health care provider. The images from an  echocardiogram can provide information such as:  Evidence of coronary artery disease (CAD).  Heart size.  Heart muscle function.  Heart valve function.  Aneurysm detection.  Evidence of a past heart attack.  Fluid buildup around the heart.  Heart muscle thickening.  Assess heart valve function.  Tell a health care provider about:  Any allergies you have.  All medicines you are taking, including vitamins, herbs, eye drops, creams, and over-the-counter medicines.  Any problems you or family members have had with anesthetic medicines.  Any blood disorders you have.  Any surgeries you have had.  Any medical conditions you have.  Whether you are pregnant or may be pregnant. What happens before the procedure? No special preparation is needed. Eat and drink normally. What happens during the procedure?  In order to produce an image of your heart, gel will be applied to your chest and a wand-like tool (transducer) will be moved over your chest. The gel will help transmit the sound waves from the transducer. The sound waves will harmlessly bounce off your heart to allow the heart images to be captured in real-time motion. These images will then be recorded.  You may need an IV to receive a medicine that improves the quality of the pictures. What happens after the procedure? You may return to your normal schedule including diet, activities, and medicines, unless your health care provider tells you otherwise. This information is not intended to replace advice given to you by your health care provider. Make sure you discuss any questions you have with your health care provider. Document Released: 05/03/2000 Document Revised: 12/23/2015 Document Reviewed: 01/11/2013 Elsevier Interactive Patient Education  2017 [employer]  Nuclear Scan A cardiac nuclear scan is a test that measures blood flow to the heart when a person is resting and when he or she is exercising. The  test looks for problems such as:  Not enough blood reaching a portion of the heart.  The heart muscle not working normally.  You may need this test if:  You have heart disease.  You have had abnormal lab results.  You have had heart surgery or angioplasty.  You have chest pain.  You have shortness of breath.  In this test, a radioactive dye (tracer) is injected into your bloodstream. After the tracer has traveled to your heart, an imaging device is used to measure how much of the tracer is absorbed by or distributed to various areas of your heart. This procedure is usually done at a hospital and takes 2-4 hours. Tell a health care provider about:  Any allergies you have.  All medicines you are taking, including vitamins, herbs, eye drops, creams, and over-the-counter medicines.  Any problems you or family members have had with the use of anesthetic medicines.  Any blood disorders you have.  Any surgeries you have had.  Any medical conditions you have.  Whether you are pregnant or may be pregnant. What are the risks? Generally, this is a safe procedure. However, problems may occur, including:  Serious chest pain and heart attack. This is only a risk if the stress portion of the test is done.  Rapid heartbeat.  Sensation of warmth in your chest. This usually passes quickly.  What happens before the procedure?  Ask your health care provider about changing or stopping your regular medicines. This is especially important if you are taking diabetes medicines or blood thinners.  Remove your jewelry on the day of the procedure. What happens during the procedure?  An IV tube will be inserted into one of your veins.  Your health care provider will inject a small amount of radioactive tracer through the tube.  You will wait for 20-40 minutes while the tracer travels through your bloodstream.  Your heart activity will be monitored with an electrocardiogram (ECG).  You  will lie down on an exam table.  Images of your heart will be taken for about 15-20 minutes.  You may be asked to exercise on a treadmill or stationary bike. While you exercise, your heart's activity will be monitored with an ECG, and your blood pressure will be checked. If you are unable to exercise, you may be given a medicine to increase blood flow to parts of your heart.  When blood flow to your heart has peaked, a tracer will again be injected through the IV tube.  After 20-40 minutes, you will get back on the exam table and have more images taken of your heart.  When the procedure is over, your IV tube will be removed. The procedure may vary among health care providers and hospitals. Depending on the type of tracer used, scans may need to be repeated 3-4 hours later. What happens after the procedure?  Unless your health care provider tells you otherwise, you may return to your normal schedule, including diet, activities, and medicines.  Unless your health care provider tells you otherwise, you may increase your fluid intake. This will help flush the contrast dye from your body. Drink enough fluid to keep your urine clear or pale yellow.  It is up to you to get your test results. Ask your health care provider, or the department that is  doing the test, when your results will be ready. Summary  A cardiac nuclear scan measures the blood flow to the heart when a person is resting and when he or she is exercising.  You may need this test if you are at risk for heart disease.  Tell your health care provider if you are pregnant.  Unless your health care provider tells you otherwise, increase your fluid intake. This will help flush the contrast dye from your body. Drink enough fluid to keep your urine clear or pale yellow. This information is not intended to replace advice given to you by your health care provider. Make sure you discuss any questions you have with your health care  provider. Document Released: 05/31/2004 Document Revised: 05/08/2016 Document Reviewed: 04/14/2013 Elsevier Interactive Patient Education  2017 [employer].

## 2018-04-10 ENCOUNTER — Other Ambulatory Visit (HOSPITAL_COMMUNITY): Payer: Medicare Other

## 2018-04-13 ENCOUNTER — Telehealth (HOSPITAL_COMMUNITY): Payer: Self-pay | Admitting: *Deleted

## 2018-04-13 NOTE — Telephone Encounter (Signed)
Patient given detailed instructions per Myocardial Perfusion Study Information Sheet for the test on 06/10/17. Patient notified to arrive 15 minutes early and that it is imperative to arrive on time for appointment to keep from having the test rescheduled.  If you need to cancel or reschedule your appointment, please call the office within 24 hours of your appointment. . Patient verbalized understanding. Cassaundra Rasch Jacqueline    

## 2018-04-20 ENCOUNTER — Other Ambulatory Visit (HOSPITAL_COMMUNITY): Payer: Medicare Other

## 2018-04-20 ENCOUNTER — Ambulatory Visit (HOSPITAL_COMMUNITY): Payer: Medicare Other | Attending: Cardiovascular Disease

## 2018-04-20 ENCOUNTER — Ambulatory Visit (HOSPITAL_BASED_OUTPATIENT_CLINIC_OR_DEPARTMENT_OTHER): Payer: Medicare Other

## 2018-04-20 ENCOUNTER — Encounter (HOSPITAL_COMMUNITY): Payer: Medicare Other

## 2018-04-20 ENCOUNTER — Other Ambulatory Visit: Payer: Self-pay

## 2018-04-20 DIAGNOSIS — R0602 Shortness of breath: Secondary | ICD-10-CM | POA: Insufficient documentation

## 2018-04-20 DIAGNOSIS — R011 Cardiac murmur, unspecified: Secondary | ICD-10-CM | POA: Diagnosis not present

## 2018-04-20 DIAGNOSIS — I2510 Atherosclerotic heart disease of native coronary artery without angina pectoris: Secondary | ICD-10-CM | POA: Insufficient documentation

## 2018-04-20 LAB — MYOCARDIAL PERFUSION IMAGING
CHL CUP NUCLEAR SRS: 0
CHL CUP NUCLEAR SSS: 1
LV sys vol: 45 mL
LVDIAVOL: 91 mL (ref 62–150)
NUC STRESS TID: 1.25
Peak HR: 93 {beats}/min
Rest HR: 79 {beats}/min
SDS: 1

## 2018-04-20 MED ORDER — REGADENOSON 0.4 MG/5ML IV SOLN
0.4000 mg | Freq: Once | INTRAVENOUS | Status: AC
  Administered 2018-04-20: 0.4 mg via INTRAVENOUS

## 2018-04-20 MED ORDER — TECHNETIUM TC 99M TETROFOSMIN IV KIT
11.0000 | PACK | Freq: Once | INTRAVENOUS | Status: AC | PRN
  Administered 2018-04-20: 11 via INTRAVENOUS
  Filled 2018-04-20: qty 11

## 2018-04-20 MED ORDER — TECHNETIUM TC 99M TETROFOSMIN IV KIT
32.9000 | PACK | Freq: Once | INTRAVENOUS | Status: AC | PRN
  Administered 2018-04-20: 32.9 via INTRAVENOUS
  Filled 2018-04-20: qty 33

## 2018-04-21 ENCOUNTER — Telehealth: Payer: Self-pay | Admitting: Cardiovascular Disease

## 2018-04-21 NOTE — Telephone Encounter (Signed)
  Patient would like results of echo and stress test

## 2018-04-21 NOTE — Telephone Encounter (Signed)
Patient called with results.

## 2018-04-27 DIAGNOSIS — J302 Other seasonal allergic rhinitis: Secondary | ICD-10-CM | POA: Diagnosis not present

## 2018-04-27 DIAGNOSIS — C069 Malignant neoplasm of mouth, unspecified: Secondary | ICD-10-CM | POA: Diagnosis not present

## 2018-04-30 DIAGNOSIS — D3500 Benign neoplasm of unspecified adrenal gland: Secondary | ICD-10-CM | POA: Diagnosis not present

## 2018-04-30 DIAGNOSIS — F17210 Nicotine dependence, cigarettes, uncomplicated: Secondary | ICD-10-CM | POA: Diagnosis not present

## 2018-04-30 DIAGNOSIS — C069 Malignant neoplasm of mouth, unspecified: Secondary | ICD-10-CM | POA: Diagnosis not present

## 2018-04-30 DIAGNOSIS — N183 Chronic kidney disease, stage 3 (moderate): Secondary | ICD-10-CM | POA: Diagnosis not present

## 2018-04-30 DIAGNOSIS — I1310 Hypertensive heart and chronic kidney disease without heart failure, with stage 1 through stage 4 chronic kidney disease, or unspecified chronic kidney disease: Secondary | ICD-10-CM | POA: Diagnosis not present

## 2018-04-30 DIAGNOSIS — E7849 Other hyperlipidemia: Secondary | ICD-10-CM | POA: Diagnosis not present

## 2018-04-30 DIAGNOSIS — E668 Other obesity: Secondary | ICD-10-CM | POA: Diagnosis not present

## 2018-04-30 DIAGNOSIS — I2510 Atherosclerotic heart disease of native coronary artery without angina pectoris: Secondary | ICD-10-CM | POA: Diagnosis not present

## 2018-04-30 DIAGNOSIS — I712 Thoracic aortic aneurysm, without rupture: Secondary | ICD-10-CM | POA: Diagnosis not present

## 2018-04-30 DIAGNOSIS — Z1389 Encounter for screening for other disorder: Secondary | ICD-10-CM | POA: Diagnosis not present

## 2018-04-30 DIAGNOSIS — Z6831 Body mass index (BMI) 31.0-31.9, adult: Secondary | ICD-10-CM | POA: Diagnosis not present

## 2018-04-30 DIAGNOSIS — J438 Other emphysema: Secondary | ICD-10-CM | POA: Diagnosis not present

## 2018-05-07 DIAGNOSIS — D485 Neoplasm of uncertain behavior of skin: Secondary | ICD-10-CM | POA: Diagnosis not present

## 2018-05-07 DIAGNOSIS — L905 Scar conditions and fibrosis of skin: Secondary | ICD-10-CM | POA: Diagnosis not present

## 2018-05-07 DIAGNOSIS — Z85828 Personal history of other malignant neoplasm of skin: Secondary | ICD-10-CM | POA: Diagnosis not present

## 2018-05-07 DIAGNOSIS — C44319 Basal cell carcinoma of skin of other parts of face: Secondary | ICD-10-CM | POA: Diagnosis not present

## 2018-05-07 DIAGNOSIS — L570 Actinic keratosis: Secondary | ICD-10-CM | POA: Diagnosis not present

## 2018-05-08 ENCOUNTER — Other Ambulatory Visit: Payer: Self-pay | Admitting: *Deleted

## 2018-05-08 DIAGNOSIS — I350 Nonrheumatic aortic (valve) stenosis: Secondary | ICD-10-CM

## 2018-06-03 DIAGNOSIS — M859 Disorder of bone density and structure, unspecified: Secondary | ICD-10-CM | POA: Diagnosis not present

## 2018-06-03 DIAGNOSIS — N183 Chronic kidney disease, stage 3 (moderate): Secondary | ICD-10-CM | POA: Diagnosis not present

## 2018-06-03 DIAGNOSIS — M8589 Other specified disorders of bone density and structure, multiple sites: Secondary | ICD-10-CM | POA: Diagnosis not present

## 2018-06-04 ENCOUNTER — Encounter: Payer: Self-pay | Admitting: *Deleted

## 2018-06-18 DIAGNOSIS — C44319 Basal cell carcinoma of skin of other parts of face: Secondary | ICD-10-CM | POA: Diagnosis not present

## 2018-06-18 DIAGNOSIS — L570 Actinic keratosis: Secondary | ICD-10-CM | POA: Diagnosis not present

## 2018-06-25 ENCOUNTER — Encounter: Payer: Self-pay | Admitting: Gastroenterology

## 2018-07-01 DIAGNOSIS — M25512 Pain in left shoulder: Secondary | ICD-10-CM | POA: Diagnosis not present

## 2018-07-01 DIAGNOSIS — M19012 Primary osteoarthritis, left shoulder: Secondary | ICD-10-CM | POA: Diagnosis not present

## 2018-07-03 ENCOUNTER — Other Ambulatory Visit: Payer: Self-pay

## 2018-07-03 DIAGNOSIS — I6523 Occlusion and stenosis of bilateral carotid arteries: Secondary | ICD-10-CM

## 2018-07-03 DIAGNOSIS — F17200 Nicotine dependence, unspecified, uncomplicated: Secondary | ICD-10-CM

## 2018-07-03 DIAGNOSIS — Z9889 Other specified postprocedural states: Secondary | ICD-10-CM

## 2018-07-07 ENCOUNTER — Other Ambulatory Visit: Payer: Self-pay

## 2018-07-07 ENCOUNTER — Ambulatory Visit (INDEPENDENT_AMBULATORY_CARE_PROVIDER_SITE_OTHER): Payer: Medicare Other | Admitting: Physician Assistant

## 2018-07-07 ENCOUNTER — Encounter: Payer: Self-pay | Admitting: Family

## 2018-07-07 ENCOUNTER — Ambulatory Visit (HOSPITAL_COMMUNITY)
Admission: RE | Admit: 2018-07-07 | Discharge: 2018-07-07 | Disposition: A | Discharge status: Home / Self Care | Payer: Medicare Other | Source: Ambulatory Visit | Attending: Family | Admitting: Family

## 2018-07-07 VITALS — BP 117/72 | HR 101 | Temp 98.3°F | Resp 18 | Ht 69.0 in | Wt 200.2 lb

## 2018-07-07 DIAGNOSIS — F17200 Nicotine dependence, unspecified, uncomplicated: Secondary | ICD-10-CM | POA: Diagnosis not present

## 2018-07-07 DIAGNOSIS — Z9889 Other specified postprocedural states: Secondary | ICD-10-CM | POA: Insufficient documentation

## 2018-07-07 DIAGNOSIS — I6523 Occlusion and stenosis of bilateral carotid arteries: Secondary | ICD-10-CM

## 2018-07-07 NOTE — Progress Notes (Signed)
History of Present Illness:  [name] is a 78 y.o. year old male who presents for evaluation of carotid stenosis and repeat carotid duplex studies.  He is status post right carotid endarterectomy on 10/01/2013 by Dr. Donnetta Hutching for severe (80% stenosis) asymptomatic disease.   The [name] denies symptoms of TIA, amaurosis, or stroke.   Pt meds include: Statin : Yes ASA:no Other anticoagulants/antiplatelets:Yes, had a DVT in his left leg in 2016, about 4 months after right knee replaced, which resulted in a PE, takes Eliquis  Past Medical History:  Diagnosis Date  . Anxiety    reports scotch helps that   . Arthritis    " it's everywhere"  . Blood transfusion without reported diagnosis   . Cataract    having cataract surgery July 2016  . Chronic kidney disease    hx kidney stones  . Colon polyps   . Complication of anesthesia    after surgery 07-2013 unable to sleep for 3 days  . GERD (gastroesophageal reflux disease)   . History of kidney stones   . Hyperlipidemia   . Hypertension   . Prostate hypertrophy   . Pulmonary embolism ([place]) 12/2014   post TJR  . Seasonal allergies   . 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 Sleep apnea    had surgery in 2000 for sleep apnea, no CPAP needed    Past Surgical History:  Procedure Laterality Date  . CAROTID ENDARTERECTOMY Right Oct 01, 2013   CE  . COLONOSCOPY    . ENDARTERECTOMY Right 10/01/2013   Procedure: ENDARTERECTOMY CAROTID-RIGHT;  Surgeon: Rosetta Posner, MD;  Location: [place];  Service: Vascular;  Laterality: Right;  . ESOPHAGOGASTRODUODENOSCOPY ENDOSCOPY  10-12-2013  . EXCISION ORAL LESION WITH CO2 LASER N/A 09/08/2015   Procedure: EXCISION ORAL LESION WITH CO2 LASER;  Surgeon: Jerrell Belfast, MD;  Location: [place];  Service: ENT;  Laterality: N/A;  . EYE SURGERY Bilateral    cataracts, W/IOL  . JOINT REPLACEMENT Right September 02, 2014   Knee  . JOINT REPLACEMENT Left 2012   Knee  . PATCH ANGIOPLASTY Right 10/01/2013   Procedure: PATCH ANGIOPLASTY USING HEMASHIELD FINESSE PATCH;  Surgeon: Rosetta Posner, MD;  Location: [place];  Service: Vascular;  Laterality: Right;  . removal of melonomia Left    shoulder  . REPLACEMENT TOTAL KNEE Left   . REVERSE SHOULDER ARTHROPLASTY Right 10/11/2016   Procedure: REVERSE RIGHT SHOULDER ARTHROPLASTY;  Surgeon: Netta Cedars, MD;  Location: [place];  Service: Orthopedics;  Laterality: Right;  . RHINOPLASTY     1999  . RHINOPLASTY    . SKIN CANCER EXCISION     squamous cell cancer under tongue  . SKIN SURGERY     squamous cell carcinoma removed in various places,melanoma removed from L shoulder  . sleep apnea surgery  2000  . STERIOD INJECTION Left 10/11/2016   Procedure: STEROID INJECTION LEFT SHOULDER;  Surgeon: Netta Cedars, MD;  Location: [place];  Service: Orthopedics;  Laterality: Left;  . TOTAL HIP ARTHROPLASTY Left 10/01/2017   Procedure: LEFT TOTAL HIP ARTHROPLASTY ANTERIOR APPROACH;  Surgeon: Gaynelle Arabian, MD;  [facility];  Service: Orthopedics;  Laterality: Left;  . TOTAL KNEE ARTHROPLASTY Right 09/02/2014   Procedure: RIGHT TOTAL KNEE ARTHROPLASTY;  Surgeon: Netta Cedars, MD;  Location: [place];  Service: Orthopedics;  Laterality: Right;  Social History Social History   Tobacco Use  . Smoking status: Light Tobacco Smoker    Years: 50.00    Types: Cigars  . Smokeless tobacco: Never Used  . Tobacco comment: pt states he smokes a couple of cigars per day. Never smoke cigarettes  Substance Use Topics  . Alcohol use: Yes    Alcohol/week: 21.0 standard drinks    Types: 7 Glasses of wine, 14 Shots of liquor per week  . Drug use: No    Family History Family History  Problem Relation Age of Onset  . Diverticulosis Mother   . Skin cancer Mother   . Varicose Veins Mother   . Heart attack Mother   . Hyperlipidemia Father   . Heart disease Father   . Heart attack  Father   . Colon cancer Neg Hx   . Esophageal cancer Neg Hx   . Rectal cancer Neg Hx   . Stomach cancer Neg Hx     Allergies  Allergies  Allergen Reactions  . Cefaclor Other (See Comments)     skin split  . Penicillins Rash    Has [name] had a PCN reaction causing immediate rash, facial/tongue/throat swelling, SOB or lightheadedness with hypotension:unsure Has [name] had a PCN reaction causing severe rash involving mucus membranes or skin necrosis:unsure Has [name] had a PCN reaction that required hospitalization:No Has [name] had a PCN reaction occurring within the last 10 years:No--childhood allergy If all of the above answers are "NO", then may proceed with Cephalosporin use.      Current Outpatient Medications  Medication Sig Dispense Refill  . acetaminophen (TYLENOL) 650 MG CR tablet Take 1,300 mg by mouth daily.     Marland Kitchen apixaban (ELIQUIS) 5 MG TABS tablet Take 5 mg by mouth 2 (two) times daily.     . B Complex-C (B-COMPLEX WITH VITAMIN C) tablet Take 1 tablet by mouth daily.    . cholecalciferol (VITAMIN D) 1000 units tablet Take 1,000 Units by mouth daily.    Marland Kitchen esomeprazole (NEXIUM) 40 MG capsule Take 40 mg by mouth daily as needed (acid reflux).     . fenofibrate (TRICOR) 145 MG tablet Take 145 mg by mouth daily before breakfast.     . fluticasone (FLONASE) 50 MCG/ACT nasal spray Place 2 sprays into both nostrils daily as needed for allergies or rhinitis.    . Fluticasone-Salmeterol (ADVAIR) 100-50 MCG/DOSE AEPB Inhale 1 puff into the lungs 2 (two) times daily as needed (for respiratory issues.).    Marland Kitchen losartan-hydrochlorothiazide (HYZAAR) 100-12.5 MG tablet Take 1 tablet by mouth daily.    . Omega-3 Fatty Acids (FISH OIL) 1000 MG CAPS Take 1,000 mg by mouth daily.    . probenecid (BENEMID) 500 MG tablet Take 500 mg by mouth daily before breakfast.     . rosuvastatin (CRESTOR) 10 MG tablet Take 10 mg by mouth daily.    . sildenafil (VIAGRA) 25 MG tablet Take 25 mg by  mouth daily as needed for erectile dysfunction.    . tamsulosin (FLOMAX) 0.4 MG CAPS capsule Take 0.4 mg by mouth daily.      Current Facility-Administered Medications  Medication Dose Route Frequency Provider Last Rate Last Dose  . 0.9 %  sodium chloride infusion  500 mL Intravenous Once Armbruster, Carlota Raspberry, MD        ROS:   General:  No weight loss, Fever, chills  HEENT: No recent headaches, no nasal bleeding, no visual changes, no sore throat  Neurologic: No dizziness, blackouts,  seizures. No recent symptoms of stroke or mini- stroke. No recent episodes of slurred speech, or temporary blindness.  Cardiac: No recent episodes of chest pain/pressure, no shortness of breath at rest.  No shortness of breath with exertion.  Denies history of atrial fibrillation or irregular heartbeat  Vascular: No history of rest pain in feet.  No history of claudication.  No history of non-healing ulcer, No history of DVT   Pulmonary: No home oxygen, no productive cough, no hemoptysis,  No asthma or wheezing  Musculoskeletal:  [x ] Arthritis, [ ]  Low back pain,  [x ] Joint pain  Hematologic:No history of hypercoagulable state.  No history of easy bleeding.  No history of anemia  Gastrointestinal: No hematochezia or melena,  No gastroesophageal reflux, no trouble swallowing  Urinary: [ ]  chronic Kidney disease, [ ]  on HD - [ ]  MWF or [ ]  TTHS, [ ]  Burning with urination, [ ]  Frequent urination, [ ]  Difficulty urinating;   Skin: No rashes  Psychological: No history of anxiety,  No history of depression   Physical Examination  Vitals:   07/07/18 1151  BP: 117/72  Pulse: (!) 101  Resp: 18  Temp: 98.3 F (36.8 C)  TempSrc: Oral  SpO2: 96%  Weight: 200 lb 2.8 oz (90.8 kg)  Height: 5\' 9"  (1.753 m)    Body mass index is 29.56 kg/m.  General:  Alert and oriented, no acute distress HEENT: Normal, normocephalic Neck: No bruit or JVD Pulmonary: Clear to auscultation bilaterally Cardiac:  Regular Rate and Rhythm without murmur Gastrointestinal: Soft, non-tender, non-distended, no mass, no scars Skin: No rash Extremity Pulses:  2+ radial, brachial, femoral, dorsalis pedis pulses bilaterally Musculoskeletal: No deformity or edema, left shoulder crepitus with decreased motion, s/p B TKA  Neurologic: Upper and lower extremity motor 5/5 and symmetric  DATA:     Right Carotid Findings: +----------+--------+--------+--------+------------+--------+           PSV cm/sEDV cm/sStenosisDescribe    Comments +----------+--------+--------+--------+------------+--------+ CCA Prox  94      22                          tortuous +----------+--------+--------+--------+------------+--------+ CCA Mid   98      25      <50%    heterogenous         +----------+--------+--------+--------+------------+--------+ CCA Distal85      27      <50%    heterogenous         +----------+--------+--------+--------+------------+--------+ ICA Prox  41      16                                   +----------+--------+--------+--------+------------+--------+ ICA Mid   55      26                                   +----------+--------+--------+--------+------------+--------+ ICA Distal66      26                                   +----------+--------+--------+--------+------------+--------+ ECA       72      14                                   +----------+--------+--------+--------+------------+--------+  +----------+--------+-------+----------------+-------------------+  PSV cm/sEDV cmsDescribe        Arm Pressure (mmHG) +----------+--------+-------+----------------+-------------------+ [id number]             Multiphasic, WNL                    +----------+--------+-------+----------------+-------------------+  +---------+--------+--+--------+-+---------+ VertebralPSV cm/s18EDV  cm/s6Antegrade +---------+--------+--+--------+-+---------+    Left Carotid Findings: +----------+--------+--------+--------+------------+--------+           PSV cm/sEDV cm/sStenosisDescribe    Comments +----------+--------+--------+--------+------------+--------+ CCA Prox  111     19      <50%    heterogenoustortuous +----------+--------+--------+--------+------------+--------+ CCA Mid   74      22      <50%    heterogenous         +----------+--------+--------+--------+------------+--------+ CCA Distal71      21      <50%    heterogenous         +----------+--------+--------+--------+------------+--------+ ICA Prox  147     48      40-59%              tortuous +----------+--------+--------+--------+------------+--------+ ICA Mid   123     44                          PST      +----------+--------+--------+--------+------------+--------+ ICA Distal99      29                                   +----------+--------+--------+--------+------------+--------+ ECA       73      15                          tortuous +----------+--------+--------+--------+------------+--------+  +----------+--------+--------+----------------+-------------------+ SubclavianPSV cm/sEDV cm/sDescribe        Arm Pressure (mmHG) +----------+--------+--------+----------------+-------------------+           102             Multiphasic, WNL                    +----------+--------+--------+----------------+-------------------+  +---------+--------+--+--------+--+---------+ VertebralPSV cm/s54EDV cm/s18Antegrade +---------+--------+--+--------+--+---------+    Summary: Right Carotid: Non-hemodynamically significant plaque <50% noted in the CCA.                Patent right carotid endarterectomy without evidence of                restenosis.  Left Carotid: Velocities in the left ICA are consistent with a 40-59% stenosis.                Non-hemodynamically significant plaque noted in the CCA.  Vertebrals:  Bilateral vertebral arteries demonstrate antegrade flow. Subclavians: Normal flow hemodynamics were seen in bilateral subclavian              arteries.  ASSESSMENT:  Asymptomatic Carotid stenosis s/p right carotid endarterectomy on 10/01/2013 by Dr. Donnetta Hutching without re stenosis. Asymptomatic left carotid stenosis< 59%  PLAN: He continues to be asymptomatic.  He is very active and works daily.  He will f/u in 1 year for repeat carotid duplex.  If he developed signs or symptoms of stroke or TIA he will call 911.   Roxy Horseman PA-C [facility] Office: [phone]  MD in clinic [place]

## 2018-07-21 ENCOUNTER — Other Ambulatory Visit (INDEPENDENT_AMBULATORY_CARE_PROVIDER_SITE_OTHER): Payer: Medicare Other

## 2018-07-21 ENCOUNTER — Ambulatory Visit (INDEPENDENT_AMBULATORY_CARE_PROVIDER_SITE_OTHER): Payer: Medicare Other | Admitting: Gastroenterology

## 2018-07-21 ENCOUNTER — Encounter: Payer: Self-pay | Admitting: Gastroenterology

## 2018-07-21 ENCOUNTER — Telehealth: Payer: Self-pay

## 2018-07-21 VITALS — BP 122/76 | HR 76 | Ht 69.0 in | Wt 205.0 lb

## 2018-07-21 DIAGNOSIS — Z86010 Personal history of colonic polyps: Secondary | ICD-10-CM

## 2018-07-21 DIAGNOSIS — Z7901 Long term (current) use of anticoagulants: Secondary | ICD-10-CM

## 2018-07-21 DIAGNOSIS — I6523 Occlusion and stenosis of bilateral carotid arteries: Secondary | ICD-10-CM

## 2018-07-21 LAB — BASIC METABOLIC PANEL
BUN: 26 mg/dL — ABNORMAL HIGH (ref 6–23)
CHLORIDE: 102 meq/L (ref 96–112)
CO2: 27 meq/L (ref 19–32)
Calcium: 9.9 mg/dL (ref 8.4–10.5)
Creatinine, Ser: 1.54 mg/dL — ABNORMAL HIGH (ref 0.40–1.50)
GFR: 44.13 mL/min — ABNORMAL LOW (ref >60.00)
GLUCOSE: 122 mg/dL — AB (ref 70–99)
POTASSIUM: 4.2 meq/L (ref 3.5–5.1)
SODIUM: 139 meq/L (ref 135–145)

## 2018-07-21 MED ORDER — SUPREP BOWEL PREP KIT 17.5-3.13-1.6 GM/177ML PO SOLN: ORAL | 0 refills | Status: DC

## 2018-07-21 NOTE — Patient Instructions (Addendum)
If you are age 76 or older, your body mass index should be between 23-30. Your Body mass index is 30.27 kg/m. If this is out of the aforementioned range listed, please consider follow up with your Primary Care Provider.  If you are age 4 or younger, your body mass index should be between 19-25. Your Body mass index is 30.27 kg/m. If this is out of the aformentioned range listed, please consider follow up with your Primary Care Provider.   Please go to the lab in the basement of our building to have lab work done as you leave today. Hit "B" for basement when you get on the elevator.  When the doors open the lab is on your left.  We will call you with the results. Thank you.   You have been scheduled for a colonoscopy. Please follow written instructions given to you at your visit today.  Please pick up your prep supplies at the pharmacy within the next 1-3 days. If you use inhalers (even only as needed), please bring them with you on the day of your procedure. Your physician has requested that you go to www.startemmi.com and enter the access code given to you at your visit today. This web site gives a general overview about your procedure. However, you should still follow specific instructions given to you by our office regarding your preparation for the procedure.  You will be contacted by our office prior to your procedure for directions on holding your Eliquis.  If you do not hear from our office 1 week prior to your scheduled procedure, please call [phone] to discuss.    Thank you for entrusting me with your care and for choosing [facility], Dr. [place] Cellar

## 2018-07-21 NOTE — Progress Notes (Signed)
HPI :  78 year old male here for a follow-up visit. He has a history of a large tubulovillous adenoma removed in 2016 by Dr. Deatra Ina in piecemeal fashion. He has had 2 follow-up colonoscopies with me, one in 2017 and again in June 2019, both of which showed inadequate bowel preparation. He had one adenoma removed during these exams.   He's had no trouble with his bowels at all that are bothering him. He denies any constipation or diarrhea. No blood in the stools. General he's feeling pretty well. He does take Eliquis for history of pulmonary embolism. He has been able to hold this in the past for procedures.   He is interested and wants to pursue another colonoscopy with double bowel prep after discussion of options. 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 He otherwise denies any significant changes in his health since I've last seen him   Colonoscopy history: 10/11/14 - 4cm tubulovillous adenoma removed per Dr. Deatra Ina, removed in piecemeal 12/12/15 - 95mm cecal adenoma removed, otherwise poor prep 11/05/17 - bowel prep again inadequate - 2 x 3-45mm polyps removed - benign on path, non adenomatous / serrated  Past Medical History:  Diagnosis Date  . Anxiety    reports scotch helps that   . Arthritis    " it's everywhere"  . Blood transfusion without reported diagnosis   . Cataract    having cataract surgery July 2016  . Chronic kidney disease    hx kidney stones  . Colon polyps   . Complication of anesthesia    after surgery 07-2013 unable to sleep for 3 days  . GERD (gastroesophageal reflux disease)   . History of kidney stones   . Hyperlipidemia   . Hypertension   . Prostate hypertrophy   . Pulmonary embolism ([place]) 12/2014   post TJR  . Seasonal allergies   . 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 Sleep apnea    had surgery in 2000 for sleep apnea, no CPAP needed      Past Surgical History:  Procedure Laterality Date  . CAROTID ENDARTERECTOMY Right Oct 01, 2013   CE  . COLONOSCOPY    . ENDARTERECTOMY Right 10/01/2013   Procedure: ENDARTERECTOMY CAROTID-RIGHT;  Surgeon: Rosetta Posner, MD;  Location: [place];  Service: Vascular;  Laterality: Right;  . ESOPHAGOGASTRODUODENOSCOPY ENDOSCOPY  10-12-2013  . EXCISION ORAL LESION WITH CO2 LASER N/A 09/08/2015   Procedure: EXCISION ORAL LESION WITH CO2 LASER;  Surgeon: Jerrell Belfast, MD;  Location: [place];  Service: ENT;  Laterality: N/A;  . EYE SURGERY Bilateral    cataracts, W/IOL  . JOINT REPLACEMENT Right September 02, 2014   Knee  . JOINT REPLACEMENT Left 2012   Knee  . PATCH ANGIOPLASTY Right 10/01/2013   Procedure: PATCH ANGIOPLASTY USING HEMASHIELD FINESSE PATCH;  Surgeon: Rosetta Posner, MD;  Location: [place];  Service: Vascular;  Laterality: Right;  . removal of melonomia Left    shoulder  . REPLACEMENT TOTAL KNEE Left   . REVERSE SHOULDER ARTHROPLASTY Right 10/11/2016   Procedure: REVERSE RIGHT SHOULDER ARTHROPLASTY;  Surgeon: Netta Cedars, MD;  Location: [place];  Service: Orthopedics;  Laterality: Right;  . RHINOPLASTY     1999  . RHINOPLASTY    . SKIN CANCER EXCISION     squamous cell cancer under tongue  . SKIN  SURGERY     squamous cell carcinoma removed in various places,melanoma removed from L shoulder  . sleep apnea surgery  2000  . STERIOD INJECTION Left 10/11/2016   Procedure: STEROID INJECTION LEFT SHOULDER;  Surgeon: Netta Cedars, MD;  Location: [place];  Service: Orthopedics;  Laterality: Left;  . TOTAL HIP ARTHROPLASTY Left 10/01/2017   Procedure: LEFT TOTAL HIP ARTHROPLASTY ANTERIOR APPROACH;  Surgeon: Gaynelle Arabian, MD;  [facility];  Service: Orthopedics;  Laterality: Left;  . TOTAL KNEE ARTHROPLASTY Right 09/02/2014   Procedure: RIGHT TOTAL KNEE ARTHROPLASTY;  Surgeon: Netta Cedars, MD;  Location: [place];  Service: Orthopedics;  Laterality: Right;   Family History  Problem  Relation Age of Onset  . Diverticulosis Mother   . Skin cancer Mother   . Varicose Veins Mother   . Heart attack Mother   . Hyperlipidemia Father   . Heart disease Father   . Heart attack Father   . Colon cancer Neg Hx   . Esophageal cancer Neg Hx   . Rectal cancer Neg Hx   . Stomach cancer Neg Hx    Social History   Tobacco Use  . Smoking status: Light Tobacco Smoker    Years: 50.00    Types: Cigars  . Smokeless tobacco: Never Used  . Tobacco comment: pt states he smokes a couple of cigars per day. Never smoke cigarettes  Substance Use Topics  . Alcohol use: Yes    Alcohol/week: 21.0 standard drinks    Types: 7 Glasses of wine, 14 Shots of liquor per week  . Drug use: No   Current Outpatient Medications  Medication Sig Dispense Refill  . apixaban (ELIQUIS) 5 MG TABS tablet Take 5 mg by mouth 2 (two) times daily.     . B Complex-C (B-COMPLEX WITH VITAMIN C) tablet Take 1 tablet by mouth daily.    . cholecalciferol (VITAMIN D) 1000 units tablet Take 1,000 Units by mouth daily.    Marland Kitchen esomeprazole (NEXIUM) 40 MG capsule Take 40 mg by mouth daily as needed (acid reflux).     . fenofibrate (TRICOR) 145 MG tablet Take 145 mg by mouth daily before breakfast.     . fluticasone (FLONASE) 50 MCG/ACT nasal spray Place 2 sprays into both nostrils daily as needed for allergies or rhinitis.    . Fluticasone-Salmeterol (ADVAIR) 100-50 MCG/DOSE AEPB Inhale 1 puff into the lungs 2 (two) times daily as needed (for respiratory issues.).    Marland Kitchen losartan-hydrochlorothiazide (HYZAAR) 100-12.5 MG tablet Take 1 tablet by mouth daily.    . Magnesium 100 MG TABS Take 1 tablet by mouth daily.    . NON FORMULARY CBD Oil 1000mg  daily    . Omega-3 Fatty Acids (FISH OIL) 1000 MG CAPS Take 1,000 mg by mouth daily.    . probenecid (BENEMID) 500 MG tablet Take 500 mg by mouth daily before breakfast.     . rosuvastatin (CRESTOR) 10 MG tablet Take 10 mg by mouth daily.    . sildenafil (VIAGRA) 25 MG tablet Take  25 mg by mouth daily as needed for erectile dysfunction.    . tamsulosin (FLOMAX) 0.4 MG CAPS capsule Take 0.4 mg by mouth daily.      Current Facility-Administered Medications  Medication Dose Route Frequency Provider Last Rate Last Dose  . 0.9 %  sodium chloride infusion  500 mL Intravenous Once , Carlota Raspberry, MD       Allergies  Allergen Reactions  . Cefaclor Other (See Comments)  skin split  . Penicillins Rash    Has patient had a PCN reaction causing immediate rash, facial/tongue/throat swelling, SOB or lightheadedness with hypotension:unsure Has patient had a PCN reaction causing severe rash involving mucus membranes or skin necrosis:unsure Has patient had a PCN reaction that required hospitalization:No Has patient had a PCN reaction occurring within the last 10 years:No--childhood allergy If all of the above answers are "NO", then may proceed with Cephalosporin use.      Review of Systems: All systems reviewed and negative except where noted in HPI.   Lab Results  Component Value Date   WBC 13.7 (H) 10/02/2017   HGB 13.0 10/02/2017   HCT 41.3 10/02/2017   MCV 96.9 10/02/2017   PLT 146 (L) 10/02/2017    Lab Results  Component Value Date   CREATININE 1.54 (H) 07/21/2018   BUN 26 (H) 07/21/2018   NA 139 07/21/2018   K 4.2 07/21/2018   CL 102 07/21/2018   CO2 27 07/21/2018    Lab Results  Component Value Date   ALT 28 09/27/2016   AST 28 09/27/2016   ALKPHOS 28 (L) 09/27/2016   BILITOT 0.7 09/27/2016      Physical Exam: BP 122/76   Pulse 76   Ht 5\' 9"  (1.753 m)   Wt 205 lb (93 kg)   BMI 30.27 kg/m  Constitutional: Pleasant,well-developed, male in no acute distress. HEENT: Normocephalic and atraumatic. Conjunctivae are normal. No scleral icterus. Neck supple.  Cardiovascular: Normal rate, regular rhythm.  Pulmonary/chest: Effort normal and breath sounds normal. No wheezing, rales or rhonchi. Abdominal: Soft, nondistended, nontender. . There  are no masses palpable. No hepatomegaly. Extremities: no edema Lymphadenopathy: No cervical adenopathy noted. Neurological: Alert and oriented to person place and time. Skin: Skin is warm and dry. No rashes noted. Psychiatric: Normal mood and affect. Behavior is normal.   ASSESSMENT AND PLAN: 78 year old male here for reassessment of following issues:  History of colon polyps / anticoagulated - history of a very large tubulovillous adenoma removed in 2016, further surveillance colonoscopy 2 have been limited by poor bowel prep. We discussed if he wanted to have any further attempts at colonoscopy in light of his issues with bowel preparation. He appears quite motivated to have a high quality exam prior to stopping further surveillance. I'm recommending a colonoscopy with a double bowel preparation if he wants to proceed. Following discussion risks and benefits he did want to proceed. He will need to hold his Eliquis prior to his colonoscopy. I will recheck his renal function to calculate GFR and hold based on that result. He agreed with the plan, further recommendations pending the results.   [place] Cellar, MD [facility]

## 2018-07-22 NOTE — Telephone Encounter (Signed)
Letter faxed to Dr. Shon Baton, PCP for clearance to hold Eliquis for 3 days

## 2018-07-22 NOTE — Telephone Encounter (Signed)
[facility] Pre-operative Risk Assessment     Request for surgical clearance:     Endoscopy Procedure  What type of surgery is being performed?     colonoscopy  When is this surgery scheduled?     08-26-2018  What type of clearance is required ?   Pharmacy  Are there any medications that need to be held prior to surgery and how long? Eliquis 3 days  Practice name and name of physician performing surgery?  [place] Cellar, MD    [facility]  What is your office phone and fax number?      Phone- [phone]  Fax- [phone] Lemar Lofty, CMA  Anesthesia type (None, local, MAC, general) ?       MAC

## 2018-07-22 NOTE — Telephone Encounter (Signed)
Letter faxed to PCP on 3-4

## 2018-07-22 NOTE — Telephone Encounter (Signed)
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    Procedure: colonoscopy Date of procedure: 08/26/2018  CrCl 55ml/min  GI is requesting a 3 day Eliquis hold. PCP has been prescribing Eliquis. Would recommend contacting PCP for their input on whether they feel pt is safe to hold Eliquis for 3 days given history of saddle PE and DVT.

## 2018-07-22 NOTE — Telephone Encounter (Signed)
-----   Message from Yetta Flock, MD sent at 07/21/2018  6:52 PM EST ----- Jan can you relay the following: - renal function has worsened slightly since I have last seen him. He will need to hold Eliquis for 3 days prior to colonoscopy if you can get approval for that from his PCP. Otherwise, he should follow up with PCP for management of his CKD. Thanks

## 2018-07-24 DIAGNOSIS — R300 Dysuria: Secondary | ICD-10-CM | POA: Diagnosis not present

## 2018-07-27 DIAGNOSIS — C069 Malignant neoplasm of mouth, unspecified: Secondary | ICD-10-CM | POA: Diagnosis not present

## 2018-07-27 DIAGNOSIS — K1370 Unspecified lesions of oral mucosa: Secondary | ICD-10-CM | POA: Diagnosis not present

## 2018-07-27 NOTE — Telephone Encounter (Signed)
Caryl Pina called and gave clinical fax #: [phone]. Request faxed .

## 2018-07-27 NOTE — Telephone Encounter (Signed)
refaxed request and LM for nurse Caryl Pina

## 2018-07-28 DIAGNOSIS — N401 Enlarged prostate with lower urinary tract symptoms: Secondary | ICD-10-CM | POA: Diagnosis not present

## 2018-07-28 DIAGNOSIS — N5201 Erectile dysfunction due to arterial insufficiency: Secondary | ICD-10-CM | POA: Diagnosis not present

## 2018-07-28 DIAGNOSIS — N139 Obstructive and reflux uropathy, unspecified: Secondary | ICD-10-CM | POA: Diagnosis not present

## 2018-07-28 DIAGNOSIS — R3912 Poor urinary stream: Secondary | ICD-10-CM | POA: Diagnosis not present

## 2018-07-29 NOTE — Telephone Encounter (Signed)
Called and LM requesting response from fax asking Dr. Virgina Jock for clearance for pt to hold Eliquis for colonoscopy procedure.

## 2018-07-31 DIAGNOSIS — L905 Scar conditions and fibrosis of skin: Secondary | ICD-10-CM | POA: Diagnosis not present

## 2018-07-31 DIAGNOSIS — C44729 Squamous cell carcinoma of skin of left lower limb, including hip: Secondary | ICD-10-CM | POA: Diagnosis not present

## 2018-07-31 DIAGNOSIS — Z85828 Personal history of other malignant neoplasm of skin: Secondary | ICD-10-CM | POA: Diagnosis not present

## 2018-07-31 DIAGNOSIS — L570 Actinic keratosis: Secondary | ICD-10-CM | POA: Diagnosis not present

## 2018-08-04 NOTE — Telephone Encounter (Signed)
Caryl Pina called. They are not getting our faxes even though I have received confirmations. I refaxed the request to both fax numbers she gave me.  Dr. Virgina Jock is approving the hold verbally but I let her know we need something in writing.

## 2018-08-04 NOTE — Telephone Encounter (Signed)
Called and Left another message for nurse of Dr. Virgina Jock regarding clearance for Eliquis for procedure

## 2018-08-05 NOTE — Telephone Encounter (Signed)
Received fax from Dr. Virgina Jock indicating approval to hold Eliquis for 3 days prior to procedure scheduled for 08-26-2018. Called and spoke to pt.  He expressed understanding to hold Eliquis on April 5 - 7th and Dr. Havery Moros will tell him when to resume on the 8th.

## 2018-08-13 DIAGNOSIS — C44729 Squamous cell carcinoma of skin of left lower limb, including hip: Secondary | ICD-10-CM | POA: Diagnosis not present

## 2018-08-15 ENCOUNTER — Encounter: Payer: Self-pay | Admitting: *Deleted

## 2018-08-16 ENCOUNTER — Telehealth: Payer: Self-pay | Admitting: *Deleted

## 2018-08-16 NOTE — Telephone Encounter (Signed)
Left patient message to call regarding upcoming visit

## 2018-08-18 ENCOUNTER — Encounter: Payer: Self-pay | Admitting: Cardiovascular Disease

## 2018-08-18 ENCOUNTER — Telehealth (INDEPENDENT_AMBULATORY_CARE_PROVIDER_SITE_OTHER): Payer: Medicare Other | Admitting: Cardiovascular Disease

## 2018-08-18 VITALS — BP 124/75 | HR 97 | Ht 70.0 in | Wt 196.0 lb

## 2018-08-18 DIAGNOSIS — F17290 Nicotine dependence, other tobacco product, uncomplicated: Secondary | ICD-10-CM

## 2018-08-18 DIAGNOSIS — I259 Chronic ischemic heart disease, unspecified: Secondary | ICD-10-CM

## 2018-08-18 DIAGNOSIS — I350 Nonrheumatic aortic (valve) stenosis: Secondary | ICD-10-CM

## 2018-08-18 DIAGNOSIS — I2510 Atherosclerotic heart disease of native coronary artery without angina pectoris: Secondary | ICD-10-CM | POA: Diagnosis not present

## 2018-08-18 DIAGNOSIS — I10 Essential (primary) hypertension: Secondary | ICD-10-CM

## 2018-08-18 HISTORY — DX: Atherosclerotic heart disease of native coronary artery without angina pectoris: I25.10

## 2018-08-18 HISTORY — DX: Nonrheumatic aortic (valve) stenosis: I35.0

## 2018-08-18 NOTE — Patient Instructions (Signed)
Medication Instructions:  Your physician recommends that you continue on your current medications as directed. Please refer to the Current Medication list given to you today.  If you need a refill on your cardiac medications before your next appointment, please call your pharmacy.   Lab work: NONE  Testing/Procedures: Your physician has requested that you have an echocardiogram. Echocardiography is a painless test that uses sound waves to create images of your heart. It provides your doctor with information about the size and shape of your heart and how well your heart's chambers and valves are working. This procedure takes approximately one hour. There are no restrictions for this procedure. [address] IN December PRIOR TO YOUR APPOINTMENT WITH DR [facility]   Follow-Up: At [facility], you and your health needs are our priority.  As part of our continuing mission to provide you with exceptional heart care, we have created designated Provider Care Teams.  These Care Teams include your primary Cardiologist (physician) and Advanced Practice Providers (APPs -  Physician Assistants and Nurse Practitioners) who all work together to provide you with the care you need, when you need it. You will need a follow up appointment in December AFTER ECHO  Please call our office 2 months in advance to schedule this appointment.  You may see DR [facility]  or one of the following Advanced Practice Providers on your designated Care Team:   Kerin Ransom, PA-C Roby Lofts, [place] . Sande Rives, PA-C         .

## 2018-08-18 NOTE — Progress Notes (Signed)
Virtual Visit via Video Note    Evaluation Performed:  Follow-up visit  This visit type was conducted due to national recommendations for restrictions regarding the COVID-19 Pandemic (e.g. social distancing).  This format is felt to be most appropriate for this patient at this time.  All issues noted in this document were discussed and addressed.  No physical exam was performed (except for noted visual exam findings with Video Visits).  Please refer to the patient's chart (MyChart message for video visits and phone note for telephone visits) for the patient's consent to telehealth for [facility].  Date:  08/18/2018   ID:  Derek, Bender 1940/07/19, MRN [id number]  Patient Location:  Home  Provider location:   Office  PCP:  Shon Baton, MD  Cardiologist:  No primary care provider on file.  Electrophysiologist:  None   Chief Complaint:  Follow up  History of Present Illness:    [name] is a 77 y.o. male who presents via audio/video conferencing for a telehealth visit today.    [name] is a 78 y.o. male with asymptomatic coronary calcification, mild aortic stenosis, carotid stenosis status post right CEA, hypertension, emphysema, prior PE, multiple skin cancers, and GI bleed here for follow up. He was initially seen 03/2018 for the evaluation of coronary calcification.  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  He had a follow up chest CT 03/16/18 that revealed atherosclerosis of the aorta and all three coronary vessels including the left main.  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  He was feeling well but didn't get much formal exercise so he was referred for a Lexiscan Myoview 04/2018 that revealed no ischemia.  TID ratio was 1.25.  He was also noted to have a systolic murmur and had an echo that revealed LVEF 31-12%, grade 1 diastolic dysfunction and mild aortic stenosis (mean gradient 14 mmHg).    Since his last appointment [name] has been well.  He has not experienced any chest pain or shortness of breath.  He has a chronic cough that he attributes to chronic stable bronchitis ever since he had his pulmonary embolism.  He has not experienced any lower extremity edema, orthopnea, or PND.  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  The margins were clear and he is healing well.  His weight has been stable.  He has not been getting much formal exercise lately but denies any exertional chest pain or shortness of breath.   The patient does not have symptoms concerning for COVID-19 infection (fever, chills, cough, or new shortness of breath).    Prior CV studies:   The following studies were reviewed today:  Lexiscan Myoview 04/20/18:  Nuclear stress EF: 50%.  There was no ST segment deviation noted during stress.  No T wave inversion was noted during stress.  The study is normal.  This is a low risk study.  The left ventricular ejection fraction is mildly decreased (45-54%). Abnormal TID ratio of 1.25 indicates a possibility of balanced ischemia. Though the specificity of this finding is limited in a pharmacologic stress test compared with an exercise stress test.  Echo 04/20/18: Study Conclusions  - Left ventricle: The cavity size was normal. Wall thickness was   increased in a pattern of mild LVH. Systolic function was normal.   The estimated ejection fraction was in the range  of 60% to 65%.   Doppler parameters are consistent with abnormal left ventricular   relaxation (grade 1 diastolic dysfunction). - Aortic valve: Trileaflet; severely calcified leaflets. There was   mild to moderate stenosis. Mean gradient (S): 14 mm Hg. Valve   area (VTI): 1.49 cm^2. - Ascending aorta: The ascending aorta was mildly dilated to 40 mm. - Mitral valve: Moderately calcified annulus. There was trivial   regurgitation. - Right ventricle: The cavity size was  normal. Systolic function   was normal. - Pulmonary arteries: No complete TR doppler jet so unable to   estimate PA systolic pressure. - Inferior vena cava: The vessel was normal in size. The   respirophasic diameter changes were in the normal range (= 50%),   consistent with normal central venous pressure.  Impressions:  - Normal LV size with mild LV hypertrophy. EF 60-65%. Normal RV   size and systolic function. Mild to moderate AS with mean   gradient 14 mmHg and AVA 1.49 cm^2.   Past Medical History:  Diagnosis Date  . Anxiety    reports scotch helps that   . Aortic stenosis, mild 08/18/2018   Mean gradient 14 mmHg 04/2018  . Arthritis    " it's everywhere"  . Blood transfusion without reported diagnosis   . Cataract    having cataract surgery July 2016  . Chronic kidney disease    hx kidney stones  . Colon polyps   . Complication of anesthesia    after surgery 07-2013 unable to sleep for 3 days  . Coronary artery calcification seen on CAT scan 08/18/2018  . GERD (gastroesophageal reflux disease)   . History of kidney stones   . Hyperlipidemia   . Hypertension   . Prostate hypertrophy   . Pulmonary embolism ([place]) 12/2014   post TJR  . Seasonal allergies   . 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 Sleep apnea    had surgery in 2000 for sleep apnea, no CPAP needed   Past Surgical History:  Procedure Laterality Date  . CAROTID ENDARTERECTOMY Right Oct 01, 2013   CE  . COLONOSCOPY    . ENDARTERECTOMY Right 10/01/2013   Procedure: ENDARTERECTOMY CAROTID-RIGHT;  Surgeon: Rosetta Posner, MD;  Location: [place];  Service: Vascular;  Laterality: Right;  . ESOPHAGOGASTRODUODENOSCOPY ENDOSCOPY  10-12-2013  . EXCISION ORAL LESION WITH CO2 LASER N/A 09/08/2015   Procedure: EXCISION ORAL LESION WITH CO2 LASER;  Surgeon: Jerrell Belfast, MD;  Location: [place];  Service: ENT;  Laterality: N/A;  . EYE  SURGERY Bilateral    cataracts, W/IOL  . JOINT REPLACEMENT Right September 02, 2014   Knee  . JOINT REPLACEMENT Left 2012   Knee  . PATCH ANGIOPLASTY Right 10/01/2013   Procedure: PATCH ANGIOPLASTY USING HEMASHIELD FINESSE PATCH;  Surgeon: Rosetta Posner, MD;  Location: [place];  Service: Vascular;  Laterality: Right;  . removal of melonomia Left    shoulder  . REPLACEMENT TOTAL KNEE Left   . REVERSE SHOULDER ARTHROPLASTY Right 10/11/2016   Procedure: REVERSE RIGHT SHOULDER ARTHROPLASTY;  Surgeon: Netta Cedars, MD;  Location: [place];  Service: Orthopedics;  Laterality: Right;  . RHINOPLASTY     1999  . RHINOPLASTY    . SKIN CANCER EXCISION     squamous cell cancer under tongue  . SKIN SURGERY     squamous cell carcinoma removed in various places,melanoma  removed from L shoulder  . sleep apnea surgery  2000  . STERIOD INJECTION Left 10/11/2016   Procedure: STEROID INJECTION LEFT SHOULDER;  Surgeon: Netta Cedars, MD;  Location: [place];  Service: Orthopedics;  Laterality: Left;  . TOTAL HIP ARTHROPLASTY Left 10/01/2017   Procedure: LEFT TOTAL HIP ARTHROPLASTY ANTERIOR APPROACH;  Surgeon: Gaynelle Arabian, MD;  Location: [facility];  Service: Orthopedics;  Laterality: Left;  . TOTAL KNEE ARTHROPLASTY Right 09/02/2014   Procedure: RIGHT TOTAL KNEE ARTHROPLASTY;  Surgeon: Netta Cedars, MD;  Location: [place];  Service: Orthopedics;  Laterality: Right;     Current Meds  Medication Sig  . apixaban (ELIQUIS) 5 MG TABS tablet Take 5 mg by mouth 2 (two) times daily.   . B Complex-C (B-COMPLEX WITH VITAMIN C) tablet Take 1 tablet by mouth daily.  . cholecalciferol (VITAMIN D) 1000 units tablet Take 1,000 Units by mouth daily.  Marland Kitchen esomeprazole (NEXIUM) 40 MG capsule Take 40 mg by mouth daily as needed (acid reflux).   . fenofibrate (TRICOR) 145 MG tablet Take 145 mg by mouth daily before breakfast.   . fluticasone (FLONASE) 50 MCG/ACT nasal spray Place 2 sprays into both nostrils daily as needed for allergies or  rhinitis.  . Fluticasone-Salmeterol (ADVAIR) 100-50 MCG/DOSE AEPB Inhale 1 puff into the lungs 2 (two) times daily as needed (for respiratory issues.).  Marland Kitchen losartan-hydrochlorothiazide (HYZAAR) 100-12.5 MG tablet Take 1 tablet by mouth daily.  . Magnesium 100 MG TABS Take 1 tablet by mouth daily.  . NON FORMULARY CBD Oil 103m daily  . Omega-3 Fatty Acids (FISH OIL) 1000 MG CAPS Take 1,000 mg by mouth daily.  . probenecid (BENEMID) 500 MG tablet Take 500 mg by mouth daily before breakfast.   . rosuvastatin (CRESTOR) 10 MG tablet Take 10 mg by mouth daily.  . sildenafil (VIAGRA) 25 MG tablet Take 25 mg by mouth daily as needed for erectile dysfunction.  .Manus GunningBOWEL PREP KIT 17.5-3.13-1.6 GM/177ML SOLN Suprep-Use as directed  . tamsulosin (FLOMAX) 0.4 MG CAPS capsule Take 0.4 mg by mouth daily.      Allergies:   Cefaclor and Penicillins   Social History   Tobacco Use  . Smoking status: Light Tobacco Smoker    Years: 50.00    Types: Cigars  . Smokeless tobacco: Never Used  . Tobacco comment: pt states he smokes a couple of cigars per day. Never smoke cigarettes  Substance Use Topics  . Alcohol use: Yes    Alcohol/week: 21.0 standard drinks    Types: 7 Glasses of wine, 14 Shots of liquor per week  . Drug use: No     Family Hx: The patient's family history includes Diverticulosis in his mother; Heart attack in his father and mother; Heart disease in his father; Hyperlipidemia in his father; Skin cancer in his mother; Varicose Veins in his mother. There is no history of Colon cancer, Esophageal cancer, Rectal cancer, or Stomach cancer.  ROS:   Please see the history of present illness.    All other systems reviewed and are negative.   Labs/Other Tests and Data Reviewed:    Recent Labs: 10/02/2017: Hemoglobin 13.0; Platelets 146 07/21/2018: BUN 26; Creatinine, Ser 1.54; Potassium 4.2; Sodium 139   Recent Lipid Panel No results found for: CHOL, TRIG, HDL, CHOLHDL, LDLCALC,  LDLDIRECT  Wt Readings from Last 3 Encounters:  08/18/18 196 lb (88.9 kg)  07/21/18 205 lb (93 kg)  07/07/18 200 lb 2.8 oz (90.8 kg)     Objective:  VS:  BP 124/75   Pulse 97   Ht '5\' 10"'  (1.778 m)   Wt 196 lb (88.9 kg)   BMI 28.12 kg/m  , BMI Body mass index is 28.12 kg/m. GENERAL:  Well appearing.  No acute distress HEENT: Pupils equal round and reactive. Oral mucosa unremarkable NECK:  No jugular venous distention.  No visible thyromegaly EXT:  No edema, no cyanosis no clubbing SKIN:  No rashes no nodules NEURO:  Cranial nerves II through XII grossly intact.  Moves all 4 extremities freely. PSYCH:  Cognitively intact, oriented to person place and time   ASSESSMENT & PLAN:    # Coronary calcifications:  Asymptomatic.  Stress test was -04/2018.  We continue the importance stressed the importance of diet and exercise.  LDL was at goal 08/2017.  He has follow-up scheduled with his primary care doctor next month and will have his lipids checked at that time.  His LDL goal is less than 70.  He is not on aspirin given that he takes Eliquis.  Continue rosuvastatin.  # Tobacco: Patient is not interested in stopping his cigars.  Cessation was advised.  #Hypertension: Blood pressure remains well-controlled on losartan/HCTZ.  Given his CAD would consider adding a beta 1 selective beta-blocker if needed in the future.  # Mild aortic stenosis: Mean gradient was 14 mmHg on echo.  Repeat echo 04/2018.  COVID-19 Education: The signs and symptoms of COVID-19 were discussed with the patient and how to seek care for testing (follow up with PCP or arrange E-visit).  The importance of social distancing was discussed today.  Patient Risk:   After full review of this patient's clinical status, I feel that they are at least moderate risk at this time.  Time:   Today, I have spent 17 minutes with the patient with telehealth technology discussing aortic stenosis, coronary calcification.      Medication Adjustments/Labs and Tests Ordered: Current medicines are reviewed at length with the patient today.  Concerns regarding medicines are outlined above.   Tests Ordered: No orders of the defined types were placed in this encounter.  Medication Changes: No orders of the defined types were placed in this encounter.   Disposition:  Follow up in 9 month(s) with Ermin Parisien C. Oval Linsey, MD, [facility]    Signed, Skeet Latch, MD  08/18/2018 12:08 PM    [place]

## 2018-08-19 ENCOUNTER — Ambulatory Visit: Payer: Medicare Other | Admitting: Cardiovascular Disease

## 2018-08-19 NOTE — Telephone Encounter (Signed)
Patient had video visit.

## 2018-08-26 ENCOUNTER — Encounter: Payer: Medicare Other | Admitting: Gastroenterology

## 2018-09-17 DIAGNOSIS — M859 Disorder of bone density and structure, unspecified: Secondary | ICD-10-CM | POA: Diagnosis not present

## 2018-09-17 DIAGNOSIS — M109 Gout, unspecified: Secondary | ICD-10-CM | POA: Diagnosis not present

## 2018-09-17 DIAGNOSIS — I10 Essential (primary) hypertension: Secondary | ICD-10-CM | POA: Diagnosis not present

## 2018-09-17 DIAGNOSIS — R739 Hyperglycemia, unspecified: Secondary | ICD-10-CM | POA: Diagnosis not present

## 2018-09-17 DIAGNOSIS — I1310 Hypertensive heart and chronic kidney disease without heart failure, with stage 1 through stage 4 chronic kidney disease, or unspecified chronic kidney disease: Secondary | ICD-10-CM | POA: Diagnosis not present

## 2018-09-17 DIAGNOSIS — E7849 Other hyperlipidemia: Secondary | ICD-10-CM | POA: Diagnosis not present

## 2018-09-17 DIAGNOSIS — Z125 Encounter for screening for malignant neoplasm of prostate: Secondary | ICD-10-CM | POA: Diagnosis not present

## 2018-09-21 DIAGNOSIS — N139 Obstructive and reflux uropathy, unspecified: Secondary | ICD-10-CM | POA: Diagnosis not present

## 2018-09-21 DIAGNOSIS — I1310 Hypertensive heart and chronic kidney disease without heart failure, with stage 1 through stage 4 chronic kidney disease, or unspecified chronic kidney disease: Secondary | ICD-10-CM | POA: Diagnosis not present

## 2018-09-21 DIAGNOSIS — N401 Enlarged prostate with lower urinary tract symptoms: Secondary | ICD-10-CM | POA: Diagnosis not present

## 2018-09-21 DIAGNOSIS — R82998 Other abnormal findings in urine: Secondary | ICD-10-CM | POA: Diagnosis not present

## 2018-09-21 DIAGNOSIS — R3912 Poor urinary stream: Secondary | ICD-10-CM | POA: Diagnosis not present

## 2018-09-24 DIAGNOSIS — I6521 Occlusion and stenosis of right carotid artery: Secondary | ICD-10-CM | POA: Diagnosis not present

## 2018-09-24 DIAGNOSIS — Z0000 Encounter for general adult medical examination without abnormal findings: Secondary | ICD-10-CM | POA: Diagnosis not present

## 2018-09-24 DIAGNOSIS — M1990 Unspecified osteoarthritis, unspecified site: Secondary | ICD-10-CM | POA: Diagnosis not present

## 2018-09-24 DIAGNOSIS — E669 Obesity, unspecified: Secondary | ICD-10-CM | POA: Diagnosis not present

## 2018-09-24 DIAGNOSIS — K635 Polyp of colon: Secondary | ICD-10-CM | POA: Insufficient documentation

## 2018-09-24 DIAGNOSIS — I2510 Atherosclerotic heart disease of native coronary artery without angina pectoris: Secondary | ICD-10-CM | POA: Diagnosis not present

## 2018-09-24 DIAGNOSIS — M8580 Other specified disorders of bone density and structure, unspecified site: Secondary | ICD-10-CM | POA: Diagnosis not present

## 2018-09-24 DIAGNOSIS — N401 Enlarged prostate with lower urinary tract symptoms: Secondary | ICD-10-CM | POA: Diagnosis not present

## 2018-09-24 DIAGNOSIS — C069 Malignant neoplasm of mouth, unspecified: Secondary | ICD-10-CM | POA: Diagnosis not present

## 2018-09-24 DIAGNOSIS — I350 Nonrheumatic aortic (valve) stenosis: Secondary | ICD-10-CM | POA: Diagnosis not present

## 2018-09-24 DIAGNOSIS — E559 Vitamin D deficiency, unspecified: Secondary | ICD-10-CM | POA: Diagnosis not present

## 2018-09-24 DIAGNOSIS — Z85820 Personal history of malignant melanoma of skin: Secondary | ICD-10-CM | POA: Diagnosis not present

## 2018-09-24 DIAGNOSIS — I359 Nonrheumatic aortic valve disorder, unspecified: Secondary | ICD-10-CM | POA: Insufficient documentation

## 2018-10-06 DIAGNOSIS — R3912 Poor urinary stream: Secondary | ICD-10-CM | POA: Diagnosis not present

## 2018-10-06 DIAGNOSIS — N401 Enlarged prostate with lower urinary tract symptoms: Secondary | ICD-10-CM | POA: Diagnosis not present

## 2018-10-07 DIAGNOSIS — L821 Other seborrheic keratosis: Secondary | ICD-10-CM | POA: Diagnosis not present

## 2018-10-07 DIAGNOSIS — L819 Disorder of pigmentation, unspecified: Secondary | ICD-10-CM | POA: Diagnosis not present

## 2018-10-07 DIAGNOSIS — L814 Other melanin hyperpigmentation: Secondary | ICD-10-CM | POA: Diagnosis not present

## 2018-10-07 DIAGNOSIS — L570 Actinic keratosis: Secondary | ICD-10-CM | POA: Diagnosis not present

## 2018-10-07 DIAGNOSIS — Z85828 Personal history of other malignant neoplasm of skin: Secondary | ICD-10-CM | POA: Diagnosis not present

## 2018-10-07 DIAGNOSIS — D229 Melanocytic nevi, unspecified: Secondary | ICD-10-CM | POA: Diagnosis not present

## 2018-10-07 DIAGNOSIS — Z85820 Personal history of malignant melanoma of skin: Secondary | ICD-10-CM | POA: Diagnosis not present

## 2018-10-07 DIAGNOSIS — D1801 Hemangioma of skin and subcutaneous tissue: Secondary | ICD-10-CM | POA: Diagnosis not present

## 2018-10-27 DIAGNOSIS — J302 Other seasonal allergic rhinitis: Secondary | ICD-10-CM | POA: Diagnosis not present

## 2018-10-27 DIAGNOSIS — C069 Malignant neoplasm of mouth, unspecified: Secondary | ICD-10-CM | POA: Diagnosis not present

## 2018-10-27 DIAGNOSIS — H6983 Other specified disorders of Eustachian tube, bilateral: Secondary | ICD-10-CM | POA: Insufficient documentation

## 2018-11-03 DIAGNOSIS — L570 Actinic keratosis: Secondary | ICD-10-CM | POA: Diagnosis not present

## 2018-11-03 DIAGNOSIS — L239 Allergic contact dermatitis, unspecified cause: Secondary | ICD-10-CM | POA: Diagnosis not present

## 2018-11-03 DIAGNOSIS — D225 Melanocytic nevi of trunk: Secondary | ICD-10-CM | POA: Diagnosis not present

## 2018-11-07 DIAGNOSIS — Z20828 Contact with and (suspected) exposure to other viral communicable diseases: Secondary | ICD-10-CM | POA: Diagnosis not present

## 2018-11-17 DIAGNOSIS — M25512 Pain in left shoulder: Secondary | ICD-10-CM | POA: Diagnosis not present

## 2018-11-30 DIAGNOSIS — C44629 Squamous cell carcinoma of skin of left upper limb, including shoulder: Secondary | ICD-10-CM | POA: Diagnosis not present

## 2018-11-30 DIAGNOSIS — D485 Neoplasm of uncertain behavior of skin: Secondary | ICD-10-CM | POA: Diagnosis not present

## 2018-11-30 DIAGNOSIS — L570 Actinic keratosis: Secondary | ICD-10-CM | POA: Diagnosis not present

## 2018-12-03 DIAGNOSIS — C44619 Basal cell carcinoma of skin of left upper limb, including shoulder: Secondary | ICD-10-CM | POA: Diagnosis not present

## 2018-12-15 ENCOUNTER — Telehealth: Payer: Self-pay

## 2018-12-15 NOTE — Telephone Encounter (Signed)
Called patient to see if he will go over med list, medical history etc. before his appointment on 7/30. Left voicemail for patient to call back and see

## 2018-12-17 ENCOUNTER — Telehealth: Payer: Self-pay

## 2018-12-17 ENCOUNTER — Ambulatory Visit (INDEPENDENT_AMBULATORY_CARE_PROVIDER_SITE_OTHER): Payer: Medicare Other | Admitting: Gastroenterology

## 2018-12-17 ENCOUNTER — Encounter: Payer: Self-pay | Admitting: Gastroenterology

## 2018-12-17 VITALS — Ht 70.0 in | Wt 190.0 lb

## 2018-12-17 DIAGNOSIS — Z7901 Long term (current) use of anticoagulants: Secondary | ICD-10-CM

## 2018-12-17 DIAGNOSIS — Z860100 Personal history of colon polyps, unspecified: Secondary | ICD-10-CM

## 2018-12-17 NOTE — Telephone Encounter (Signed)
   [name] [birth year] [id number]  Dear Dr. Virgina Jock:  We have rescheduled the above named patient for a(n) Colonoscopy procedure which was cancelled due to Covid-19. Our records show that he is on anticoagulation therapy.  Please advise as to whether the patient may come off his therapy of Eliquis 3 days prior to their procedure which is scheduled for 01-09-19.  Please route your response to Tia Alert or fax response to [phone].  Sincerely,    [facility]

## 2018-12-17 NOTE — Patient Instructions (Signed)
If you are age 76 or older, your body mass index should be between 23-30. Your Body mass index is 27.26 kg/m. If this is out of the aforementioned range listed, please consider follow up with your Primary Care Provider.  If you are age 8 or younger, your body mass index should be between 19-25. Your Body mass index is 27.26 kg/m. If this is out of the aformentioned range listed, please consider follow up with your Primary Care Provider.   To help prevent the possible spread of infection to our patients, communities, and staff; we will be implementing the following measures:  As of now we are not allowing any visitors/family members to accompany you to any upcoming appointments with [facility]. If you have any concerns about this please contact our office to discuss prior to the appointment.   You have been scheduled for a colonoscopy. Please follow written instructions given to you at your visit today.  Please pick up your prep supplies at the pharmacy within the next 1-3 days. If you use inhalers (even only as needed), please bring them with you on the day of your procedure. Your physician has requested that you go to www.startemmi.com and enter the access code given to you at your visit today. This web site gives a general overview about your procedure. However, you should still follow specific instructions given to you by our office regarding your preparation for the procedure.  Thank you for entrusting me with your care and for choosing [facility], Dr. [place] Cellar

## 2018-12-17 NOTE — Telephone Encounter (Signed)
Received Fax back from [name] indicating clearance to hold [name] for 3 days prior to colonoscopy on 8-22.     Called and spoke to [name] and let him know He should take [name] on August 18th but hold it on Aug 19 - 21 and Dr. Havery Moros will let him know when to resume after his procedure on 8-22.

## 2018-12-17 NOTE — Progress Notes (Signed)
THIS ENCOUNTER IS A VIRTUAL VISIT DUE TO COVID-19 - PATIENT WAS NOT SEEN IN THE OFFICE. PATIENT HAS CONSENTED TO VIRTUAL VISIT / TELEMEDICINE VISIT USING TELEPHONE ONLY   Location of patient: home Location of provider: office Persons participating: myself, patient Time spent on call:  15 minutes  HPI :  78 year old male here for a follow-up visit. He has a history of DVT and PE, on chronic Eliquis, GFR in 40s on last check.   He has a history of a large tubulovillous adenoma removed in 2016 by Dr. Deatra Ina in piecemeal fashion. He has had 2 follow-up colonoscopies with me, one in 2017 and again in June 2019, both of which showed inadequate bowel preparation. He had one adenoma removed during these exams.   We have previously discussed at his age if he wanted a follow up colonoscopy due to his poor preps, he would require a double prep. He is not comfortable with stopping surveillance without a good prep and wanted to proceed. He was scheduled in March but it got cancelled due to COVID-19. He now wants to proceed.  He denies any constipation, diarrhea, or bleeding. No complaints in regards to his bowels or abdomen. He feels well in general. He asks about doing an upper endoscopy as well, he's concerned about his tobacco history and risk for esophageal cancer. He had an EGD in Dr. Deatra Ina in 2015 which showed some mild NSAID gastritis negative for HP. He had no Barrett's at the time, pictures show fairly normal SCJ. He is not taking antacids routinely, denies much reflux, no dysphagia, no abdominal pains. He is eating well, intentionally has lost 18 lbs due to dieting.   Colonoscopy history: 10/11/14 - 4cm tubulovillous adenoma removed per Dr. Deatra Ina, removed in piecemeal 12/12/15 - 46m cecal adenoma removed, otherwise poor prep 11/05/17 - bowel prep again inadequate - 2 x 3-43mpolyps removed - benign on path, non adenomatous / serrated  EGD 10/12/2013 - NSAID gastritis, very mild esophagitis, no BE    Past Medical History:  Diagnosis Date  . Anxiety    reports scotch helps that   . Aortic stenosis, mild 08/18/2018   Mean gradient 14 mmHg 04/2018  . Arthritis    " it's everywhere"  . Blood transfusion without reported diagnosis   . Cataract    having cataract surgery July 2016  . Chronic kidney disease    hx kidney stones  . Colon polyps   . Complication of anesthesia    after surgery 07-2013 unable to sleep for 3 days  . Coronary artery calcification seen on CAT scan 08/18/2018  . GERD (gastroesophageal reflux disease)   . History of kidney stones   . Hyperlipidemia   . Hypertension   . Prostate hypertrophy   . [place]   post [facility]  . Seasonal allergies   . 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 Sleep apnea    had surgery in 2000 for sleep apnea, no CPAP needed     Past Surgical History:  Procedure Laterality Date  . CAROTID ENDARTERECTOMY Right Oct 01, 2013   CE  . COLONOSCOPY    . ENDARTERECTOMY Right 10/01/2013   Procedure: ENDARTERECTOMY CAROTID-RIGHT;  Surgeon: ToRosetta PosnerMD;  [place] [facility] Service: Vascular;  Laterality: Right;  . ESOPHAGOGASTRODUODENOSCOPY ENDOSCOPY  10-12-2013  . EXCISION ORAL LESION WITH CO2 LASER N/A 09/08/2015  Procedure: EXCISION ORAL LESION WITH CO2 LASER;  Surgeon: Jerrell Belfast, MD;  Location: [place];  Service: ENT;  Laterality: N/A;  . EYE SURGERY Bilateral    cataracts, W/IOL  . JOINT REPLACEMENT Right September 02, 2014   Knee  . JOINT REPLACEMENT Left 2012   Knee  . PATCH ANGIOPLASTY Right 10/01/2013   Procedure: PATCH ANGIOPLASTY USING HEMASHIELD FINESSE PATCH;  Surgeon: Rosetta Posner, MD;  Location: [facility];  Service: Vascular;  Laterality: Right;  . removal of melonomia Left    shoulder  . REPLACEMENT TOTAL KNEE Left   . REVERSE SHOULDER ARTHROPLASTY Right 10/11/2016   Procedure: REVERSE RIGHT SHOULDER ARTHROPLASTY;   Surgeon: Netta Cedars, MD;  Location: [place];  Service: Orthopedics;  Laterality: Right;  . RHINOPLASTY     1999  . RHINOPLASTY    . SKIN CANCER EXCISION     squamous cell cancer under tongue  . SKIN SURGERY     squamous cell carcinoma removed in various places,melanoma removed from L shoulder  . sleep apnea surgery  2000  . STERIOD INJECTION Left 10/11/2016   Procedure: STEROID INJECTION LEFT SHOULDER;  Surgeon: Netta Cedars, MD;  Location: [place];  Service: Orthopedics;  Laterality: Left;  . TOTAL HIP ARTHROPLASTY Left 10/01/2017   Procedure: LEFT TOTAL HIP ARTHROPLASTY ANTERIOR APPROACH;  Surgeon: Gaynelle Arabian, MD;  Location: [facility];  Service: Orthopedics;  Laterality: Left;  . TOTAL KNEE ARTHROPLASTY Right 09/02/2014   Procedure: RIGHT TOTAL KNEE ARTHROPLASTY;  Surgeon: Netta Cedars, MD;  Location: [place];  Service: Orthopedics;  Laterality: Right;   Family History  Problem Relation Age of Onset  . Diverticulosis Mother   . Skin cancer Mother   . Varicose Veins Mother   . Heart attack Mother   . Hyperlipidemia Father   . Heart disease Father   . Heart attack Father   . Colon cancer Neg Hx   . Esophageal cancer Neg Hx   . Rectal cancer Neg Hx   . Stomach cancer Neg Hx    Social History   Tobacco Use  . Smoking status: Light Tobacco Smoker    Years: 50.00    Types: Cigars  . Smokeless tobacco: Never Used  . Tobacco comment: pt states he smokes a couple of cigars per day. Never smoke cigarettes  Substance Use Topics  . Alcohol use: Yes    Alcohol/week: 21.0 standard drinks    Types: 7 Glasses of wine, 14 Shots of liquor per week  . Drug use: No   Current Outpatient Medications  Medication Sig Dispense Refill  . apixaban (ELIQUIS) 5 MG TABS tablet Take 5 mg by mouth 2 (two) times daily.     . B Complex-C (B-COMPLEX WITH VITAMIN C) tablet Take 1 tablet by mouth daily.    . cholecalciferol (VITAMIN D) 1000 units tablet Take 1,000 Units by mouth daily.    Marland Kitchen esomeprazole  (NEXIUM) 40 MG capsule Take 40 mg by mouth daily as needed (acid reflux).     . fenofibrate (TRICOR) 145 MG tablet Take 145 mg by mouth daily before breakfast.     . fluticasone (FLONASE) 50 MCG/ACT nasal spray Place 2 sprays into both nostrils daily as needed for allergies or rhinitis.    . Fluticasone-Salmeterol (ADVAIR) 100-50 MCG/DOSE AEPB Inhale 1 puff into the lungs 2 (two) times daily as needed (for respiratory issues.).    Marland Kitchen losartan-hydrochlorothiazide (HYZAAR) 100-12.5 MG tablet Take 1 tablet by mouth daily.    . Magnesium 100  MG TABS Take 1 tablet by mouth daily.    . NON FORMULARY CBD Oil 1016m daily    . Omega-3 Fatty Acids (FISH OIL) 1000 MG CAPS Take 1,000 mg by mouth daily.    . probenecid (BENEMID) 500 MG tablet Take 500 mg by mouth daily before breakfast.     . rosuvastatin (CRESTOR) 10 MG tablet Take 10 mg by mouth daily.    . sildenafil (VIAGRA) 25 MG tablet Take 25 mg by mouth daily as needed for erectile dysfunction.    .Manus GunningBOWEL PREP KIT 17.5-3.13-1.6 GM/177ML SOLN Suprep-Use as directed 354 mL 0  . tamsulosin (FLOMAX) 0.4 MG CAPS capsule Take 0.4 mg by mouth daily.      No current facility-administered medications for this visit.    Allergies  Allergen Reactions  . Cefaclor Other (See Comments)     skin split  . Penicillins Rash    Has patient had a PCN reaction causing immediate rash, facial/tongue/throat swelling, SOB or lightheadedness with hypotension:unsure Has patient had a PCN reaction causing severe rash involving mucus membranes or skin necrosis:unsure Has patient had a PCN reaction that required hospitalization:No Has patient had a PCN reaction occurring within the last 10 years:No--childhood allergy If all of the above answers are "NO", then may proceed with Cephalosporin use.      Review of Systems: All systems reviewed and negative except where noted in HPI.   Lab Results  Component Value Date   CREATININE 1.54 (H) 07/21/2018   BUN 26 (H)  07/21/2018   NA 139 07/21/2018   K 4.2 07/21/2018   CL 102 07/21/2018   CO2 27 07/21/2018    Lab Results  Component Value Date   WBC 13.7 (H) 10/02/2017   HGB 13.0 10/02/2017   HCT 41.3 10/02/2017   MCV 96.9 10/02/2017   PLT 146 (L) 10/02/2017      Physical Exam: Ht '5\' 10"'  (1.778 m)   Wt 190 lb (86.2 kg)   BMI 27.26 kg/m  NA   ASSESSMENT AND PLAN: 78year old male here for reassessment of following issues:  History of colon polyps / anticoagulated - 4cm tubulovillous adenoma removed in piecemeal in 2016, further surveillance colonoscopy 2 have been limited by poor bowel prep. He does wish to have additional surveillance with a high quality bowel prep prior to stopping surveillance altogether. His last appointment was cancelled due to COVID-19, he now wishes to proceed. He's otherwise asymptomatic. I have discussed risks / benefits of colonoscopy and anesthesia and he wishes to proceed. He will need to hold Eliquis for 3 days prior to the procedure given his GFR in light of bleeding risks, we will ask his prescribing physician for approval. Recommend a 2 day double prep for this exam given limitations of his prior preps. Otherwise, he's had a EGD in 2015 which did not show any concerning findings. He is asymptomatic, no FH of esophageal or gastric cancer. I don't think he warrants a follow up endoscopy based on prior exam findings, he agreed.   SCarolina Cellar MD [facility]

## 2018-12-24 ENCOUNTER — Other Ambulatory Visit (HOSPITAL_COMMUNITY): Payer: Self-pay | Admitting: Internal Medicine

## 2018-12-24 DIAGNOSIS — I739 Peripheral vascular disease, unspecified: Secondary | ICD-10-CM

## 2018-12-25 ENCOUNTER — Ambulatory Visit (HOSPITAL_COMMUNITY)
Admission: RE | Admit: 2018-12-25 | Discharge: 2018-12-25 | Disposition: A | Discharge status: Home / Self Care | Payer: Medicare Other | Source: Ambulatory Visit | Attending: Family | Admitting: Family

## 2018-12-25 ENCOUNTER — Other Ambulatory Visit: Payer: Self-pay

## 2018-12-25 DIAGNOSIS — I739 Peripheral vascular disease, unspecified: Secondary | ICD-10-CM | POA: Diagnosis not present

## 2018-12-29 ENCOUNTER — Encounter: Payer: Medicare Other | Admitting: Gastroenterology

## 2018-12-31 DIAGNOSIS — L905 Scar conditions and fibrosis of skin: Secondary | ICD-10-CM | POA: Diagnosis not present

## 2018-12-31 DIAGNOSIS — D485 Neoplasm of uncertain behavior of skin: Secondary | ICD-10-CM | POA: Diagnosis not present

## 2018-12-31 DIAGNOSIS — Z85828 Personal history of other malignant neoplasm of skin: Secondary | ICD-10-CM | POA: Diagnosis not present

## 2018-12-31 DIAGNOSIS — L989 Disorder of the skin and subcutaneous tissue, unspecified: Secondary | ICD-10-CM | POA: Diagnosis not present

## 2019-01-04 DIAGNOSIS — N139 Obstructive and reflux uropathy, unspecified: Secondary | ICD-10-CM | POA: Diagnosis not present

## 2019-01-04 DIAGNOSIS — R3914 Feeling of incomplete bladder emptying: Secondary | ICD-10-CM | POA: Diagnosis not present

## 2019-01-04 DIAGNOSIS — N401 Enlarged prostate with lower urinary tract symptoms: Secondary | ICD-10-CM | POA: Diagnosis not present

## 2019-01-07 DIAGNOSIS — Z23 Encounter for immunization: Secondary | ICD-10-CM | POA: Diagnosis not present

## 2019-01-08 ENCOUNTER — Telehealth: Payer: Self-pay

## 2019-01-08 NOTE — Telephone Encounter (Signed)
Covid-19 screening questions   Do you now or have you had a fever in the last 14 days? No  Do you have any respiratory symptoms of shortness of breath or cough now or in the last 14 days? No  Do you have any family members or close contacts with diagnosed or suspected Covid-19 in the past 14 days? No  Have you been tested for Covid-19 and found to be positive? No        

## 2019-01-09 ENCOUNTER — Encounter: Payer: Self-pay | Admitting: Gastroenterology

## 2019-01-09 ENCOUNTER — Ambulatory Visit (AMBULATORY_SURGERY_CENTER): Payer: Medicare Other | Admitting: Gastroenterology

## 2019-01-09 ENCOUNTER — Other Ambulatory Visit: Payer: Self-pay

## 2019-01-09 VITALS — BP 101/74 | HR 91 | Temp 98.0°F | Resp 17 | Ht 70.0 in | Wt 190.0 lb

## 2019-01-09 DIAGNOSIS — Z860100 Personal history of colon polyps, unspecified: Secondary | ICD-10-CM

## 2019-01-09 DIAGNOSIS — D128 Benign neoplasm of rectum: Secondary | ICD-10-CM | POA: Diagnosis not present

## 2019-01-09 DIAGNOSIS — I10 Essential (primary) hypertension: Secondary | ICD-10-CM | POA: Diagnosis not present

## 2019-01-09 DIAGNOSIS — I739 Peripheral vascular disease, unspecified: Secondary | ICD-10-CM | POA: Diagnosis not present

## 2019-01-09 DIAGNOSIS — D123 Benign neoplasm of transverse colon: Secondary | ICD-10-CM | POA: Diagnosis not present

## 2019-01-09 MED ORDER — SODIUM CHLORIDE 0.9 % IV SOLN: 500.0000 mL | INTRAVENOUS | Status: DC

## 2019-01-09 NOTE — Progress Notes (Signed)
Report to PACU, RN, vss, BBS= Clear.  

## 2019-01-09 NOTE — Op Note (Signed)
[place] Patient Name: [name] Procedure Date: 01/09/2019 7:44 AM MRN: [id number] Endoscopist: Remo Lipps P. Havery Moros , MD Age: 78 Referring MD:  Date of Birth: [birth year] Gender: Male Account #: [id number] Procedure:                Colonoscopy Indications:              High risk colon cancer surveillance: Personal                            history of colonic polyps - advanced adenoma                            removed a few years ago, 2 colonoscopies since then                            limited by poor prep, 2 day prep for this exam. Medicines:                Monitored Anesthesia Care Procedure:                Pre-Anesthesia Assessment:                           - Prior to the procedure, a History and Physical                            was performed, and patient medications and                            allergies were reviewed. The patient's tolerance of                            previous anesthesia was also reviewed. The risks                            and benefits of the procedure and the sedation                            options and risks were discussed with the patient.                            All questions were answered, and informed consent                            was obtained. Prior Anticoagulants: The patient has                            taken Eliquis (apixaban), last dose was 3 days                            prior to procedure. ASA Grade Assessment: III - A                            patient with severe systemic disease. After  reviewing the risks and benefits, the patient was                            deemed in satisfactory condition to undergo the                            procedure.                           After obtaining informed consent, the colonoscope                            was passed under direct vision. Throughout the                            procedure, the patient's blood pressure, pulse, and                             oxygen saturations were monitored continuously. The                            Colonoscope was introduced through the anus and                            advanced to the the cecum, identified by                            appendiceal orifice and ileocecal valve. The                            colonoscopy was performed without difficulty. The                            patient tolerated the procedure well. The quality                            of the bowel preparation was adequate. The                            ileocecal valve, appendiceal orifice, and rectum                            were photographed. Scope In: 7:51:58 AM Scope Out: 8:16:29 AM Scope Withdrawal Time: 0 hours 20 minutes 15 seconds  Total Procedure Duration: 0 hours 24 minutes 31 seconds  Findings:                 The perianal and digital rectal examinations were                            normal.                           Two sessile polyps were found in the transverse  colon. The polyps were 3 to 4 mm in size. These                            polyps were removed with a cold snare. Resection                            and retrieval were complete.                           A diminutive polyp was found in the rectum. The                            polyp was sessile. The polyp was removed with a                            cold biopsy forceps. Resection and retrieval were                            complete.                           Many medium-mouthed diverticula were found in the                            left colon and right colon.                           Internal hemorrhoids were found during retroflexion.                           There was some residual liquid stool throughout the                            colon, however lavage was able to clear it all with                            good views. The exam was otherwise without                             abnormality. Complications:            No immediate complications. Estimated blood loss:                            Minimal. Estimated Blood Loss:     Estimated blood loss was minimal. Impression:               - Two 3 to 4 mm polyps in the transverse colon,                            removed with a cold snare. Resected and retrieved.                           - One diminutive polyp in the rectum, removed with  a cold biopsy forceps. Resected and retrieved.                           - Diverticulosis in the left colon and in the right                            colon.                           - Internal hemorrhoids.                           - The examination was otherwise normal. Recommendation:           - Patient has a contact number available for                            emergencies. The signs and symptoms of potential                            delayed complications were discussed with the                            patient. Return to normal activities tomorrow.                            Written discharge instructions were provided to the                            patient.                           - Resume previous diet.                           - Continue present medications.                           - Resume Eliquis tomorrow                           - Await pathology results. Remo Lipps P. Havery Moros, MD 01/09/2019 8:21:42 AM This report has been signed electronically.

## 2019-01-09 NOTE — Patient Instructions (Signed)
Please read handouts provided. Await pathology results. Continue present medications. Resume Eliquis tomorrow.       YOU HAD AN ENDOSCOPIC PROCEDURE TODAY AT [facility]:   Refer to the procedure report that was given to you for any specific questions about what was found during the examination.  If the procedure report does not answer your questions, please call your gastroenterologist to clarify.  If you requested that your care partner not be given the details of your procedure findings, then the procedure report has been included in a sealed envelope for you to review at your convenience later.  YOU SHOULD EXPECT: Some feelings of bloating in the abdomen. Passage of more gas than usual.  Walking can help get rid of the air that was put into your GI tract during the procedure and reduce the bloating. If you had a lower endoscopy (such as a colonoscopy or flexible sigmoidoscopy) you may notice spotting of blood in your stool or on the toilet paper. If you underwent a bowel prep for your procedure, you may not have a normal bowel movement for a few days.  Please Note:  You might notice some irritation and congestion in your nose or some drainage.  This is from the oxygen used during your procedure.  There is no need for concern and it should clear up in a day or so.  SYMPTOMS TO REPORT IMMEDIATELY:   Following lower endoscopy (colonoscopy or flexible sigmoidoscopy):  Excessive amounts of blood in the stool  Significant tenderness or worsening of abdominal pains  Swelling of the abdomen that is new, acute  Fever of 100F or higher   For urgent or emergent issues, a gastroenterologist can be reached at any hour by calling [phone].   DIET:  We do recommend a small meal at first, but then you may proceed to your regular diet.  Drink plenty of fluids but you should avoid alcoholic beverages for 24 hours.  ACTIVITY:  You should plan to take it easy for the rest of  today and you should NOT DRIVE or use heavy machinery until tomorrow (because of the sedation medicines used during the test).    FOLLOW UP: Our staff will call the number listed on your records 48-72 hours following your procedure to check on you and address any questions or concerns that you may have regarding the information given to you following your procedure. If we do not reach you, we will leave a message.  We will attempt to reach you two times.  During this call, we will ask if you have developed any symptoms of COVID 19. If you develop any symptoms (ie: fever, flu-like symptoms, shortness of breath, cough etc.) before then, please call [phone].  If you test positive for Covid 19 in the 2 weeks post procedure, please call and report this information to [place].    If any biopsies were taken you will be contacted by phone or by letter within the next 1-3 weeks.  Please call us at [phone] if you have not heard about the biopsies in 3 weeks.    SIGNATURES/CONFIDENTIALITY: You and/or your care partner have signed paperwork which will be entered into your electronic medical record.  These signatures attest to the fact that that the information above on your After Visit Summary has been reviewed and is understood.  Full responsibility of the confidentiality of this discharge information lies with you and/or your care-partner.

## 2019-01-09 NOTE — Progress Notes (Signed)
Called to room to assist during endoscopic procedure.  Patient ID and intended procedure confirmed with present staff. Received instructions for my participation in the procedure from the performing physician.  

## 2019-01-09 NOTE — Progress Notes (Signed)
[place] [facility], [employer]. Verlin Fester, Yaritza Leist Checked IN. Pt's states no medical or surgical changes since office visit.

## 2019-01-12 ENCOUNTER — Telehealth: Payer: Self-pay

## 2019-01-12 NOTE — Telephone Encounter (Signed)
  Follow up Call-  Call back number 01/09/2019 11/05/2017  Post procedure Call Back phone  # [phone]  Permission to leave phone message Yes Yes  Some recent data might be hidden     Patient questions:  Do you have a fever, pain , or abdominal swelling? No. Pain Score  0 *  Have you tolerated food without any problems? Yes.    Have you been able to return to your normal activities? Yes.    Do you have any questions about your discharge instructions: Diet   No. Medications  No. Follow up visit  No.  Do you have questions or concerns about your Care? No.  Actions: * If pain score is 4 or above: 1. No action needed, pain <4.Have you developed a fever since your procedure? no  2.   Have you had an respiratory symptoms (SOB or cough) since your procedure? no  3.   Have you tested positive for COVID 19 since your procedure no  4.   Have you had any family members/close contacts diagnosed with the COVID 19 since your procedure?  no   If yes to any of these questions please route to Joylene John, RN and Alphonsa Gin, Therapist, sports.

## 2019-02-01 ENCOUNTER — Telehealth: Payer: Self-pay | Admitting: Acute Care

## 2019-02-01 ENCOUNTER — Encounter: Payer: Self-pay | Admitting: Internal Medicine

## 2019-02-01 ENCOUNTER — Ambulatory Visit (INDEPENDENT_AMBULATORY_CARE_PROVIDER_SITE_OTHER): Payer: Medicare Other | Admitting: Internal Medicine

## 2019-02-01 ENCOUNTER — Other Ambulatory Visit: Payer: Self-pay

## 2019-02-01 VITALS — BP 130/74 | HR 86 | Temp 97.6°F | Ht 70.0 in | Wt 180.2 lb

## 2019-02-01 DIAGNOSIS — J42 Unspecified chronic bronchitis: Secondary | ICD-10-CM | POA: Diagnosis not present

## 2019-02-01 DIAGNOSIS — F17210 Nicotine dependence, cigarettes, uncomplicated: Secondary | ICD-10-CM

## 2019-02-01 DIAGNOSIS — Z129 Encounter for screening for malignant neoplasm, site unspecified: Secondary | ICD-10-CM

## 2019-02-01 DIAGNOSIS — I2510 Atherosclerotic heart disease of native coronary artery without angina pectoris: Secondary | ICD-10-CM

## 2019-02-01 DIAGNOSIS — Z86711 Personal history of pulmonary embolism: Secondary | ICD-10-CM

## 2019-02-01 DIAGNOSIS — Z122 Encounter for screening for malignant neoplasm of respiratory organs: Secondary | ICD-10-CM

## 2019-02-01 LAB — D-DIMER, QUANTITATIVE: D-Dimer, Quant: 0.42 mcg/mL FEU (ref <0.50)

## 2019-02-01 NOTE — Telephone Encounter (Signed)
FYI:  Dr. Chase Caller has declined the Lung Cancer Screening Program of pt , and insists on screening this patient himself. Dr Chase Caller will be responsible for all calling of results and ordering of annual scans

## 2019-02-01 NOTE — Addendum Note (Signed)
Addended by: Suzzanne Cloud E on: 02/01/2019 10:02 AM   Modules accepted: Orders

## 2019-02-01 NOTE — Patient Instructions (Addendum)
History of pulmonary embolism -  4 years complet this august 2020 - all on full dose eliquis without recurrence - June 2017 and jan 2018 d-dimer normal but June 2019 d-dimer elevated  -ongoing risk factor for PE is smoking and varicose veints (minor) - check d-dimer 02/01/2019 and -  and if normal we can reduce to eliquis 5mg  once daily as best balance between prevention and bleeding risk  - I can pass results to Shon Baton, MD so you and he can make a decision  Smoking and cancer screen - do low dose CT chest wo contrast in Nov 2020 and we  will call with results - try to quit smoking  Chronic bronchitis  - continue advair as needed - CMA to update records that you had flu shot this season 2020-2021  Followup  -12 months or sooer

## 2019-02-01 NOTE — Addendum Note (Signed)
Addended by: Peggyann Shoals T on: 02/01/2019 10:00 AM   Modules accepted: Orders

## 2019-02-01 NOTE — Addendum Note (Signed)
Addended by: Suzzanne Cloud E on: 02/01/2019 10:05 AM   Modules accepted: Orders

## 2019-02-01 NOTE — Progress Notes (Signed)
OV 02/01/2019  Subjective:  Patient ID: [name], male , DOB: 1941-02-14 , age 78 y.o. , MRN: [id number] , ADDRESS: [place] [address]   02/01/2019 -   Chief Complaint  Patient presents with  . History of Pulmonary Embolism     ICD-10-CM   1. History of pulmonary embolism  Z86.711   2. Smoking greater than 40 pack years  F17.210   3. Screening for cancer  Z12.9   4. Chronic bronchitis, unspecified chronic bronchitis type [facility])  J42      HPI JI FILA 78 y.o. -1 year follow-up for the above issues.  Last visit was in June 2019.  He continues to smoke.  But he denies any complaints.  He is frustrated that because of the pandemic and national emergency he has not been able to scuba dive.  In terms of his pulmonary embolism he continues Eliquis at full dose.  At last visit in June 2019 his d-dimer was elevated so we decided to continue his Eliquis at full dose.  It is now 4 years since he had his clot.  He is only had one blood clot so far.  There has been no recurrence.  He is open to retesting for d-dimer and deciding to take lower dose Eliquis.  His last CT scan for lung cancer screening was in October 2019 to was Halloween.  He can have his neck scan in November 2020.  He is aware that after age 39 that will be normal lung cancer screening.  He continues his Advair for chronic bronchitis.  He is already had his flu shot with his primary care physician.   Results for MUSTAFAA, WEAVERLING "Bender" (MRN [id number]) as of 02/01/2019 09:06  Ref. Range 11/02/2015 09:09 06/18/2016 09:42 11/12/2017 12:53  D-Dimer, America Brown Latest Ref Range: <0.50 mcg/mL FEU 0.33 0.33 0.64 (H)    ROS - per HPI     has a past medical history of Anxiety, Aortic stenosis, mild (08/18/2018), Arthritis, Blood transfusion without reported diagnosis, Cataract, Chronic kidney disease, Colon polyps, Complication of anesthesia, Coronary artery calcification seen on CAT scan (08/18/2018), GERD  (gastroesophageal reflux disease), History of kidney stones, Hyperlipidemia, Hypertension, Prostate hypertrophy, Pulmonary embolism ([place]) (12/2014), Seasonal allergies, Skin cancer, and Sleep apnea.   reports that he has been smoking cigars. He has smoked for the past 50.00 years. He has never used smokeless tobacco.  Past Surgical History:  Procedure Laterality Date  . CAROTID ENDARTERECTOMY Right Oct 01, 2013   CE  . COLONOSCOPY    . ENDARTERECTOMY Right 10/01/2013   Procedure: ENDARTERECTOMY CAROTID-RIGHT;  Surgeon: Rosetta Posner, MD;  Location: [facility];  Service: Vascular;  Laterality: Right;  . ESOPHAGOGASTRODUODENOSCOPY ENDOSCOPY  10-12-2013  . EXCISION ORAL LESION WITH CO2 LASER N/A 09/08/2015   Procedure: EXCISION ORAL LESION WITH CO2 LASER;  Surgeon: Jerrell Belfast, MD;  Location: [facility];  Service: ENT;  Laterality: N/A;  . EYE SURGERY Bilateral    cataracts, W/IOL  . JOINT REPLACEMENT Right September 02, 2014   Knee  . JOINT REPLACEMENT Left 2012   Knee  . PATCH ANGIOPLASTY Right 10/01/2013   Procedure: PATCH ANGIOPLASTY USING HEMASHIELD FINESSE PATCH;  Surgeon: Rosetta Posner, MD;  Location: [facility];  Service: Vascular;  Laterality: Right;  . removal of melonomia Left    shoulder  . REPLACEMENT TOTAL KNEE Left   . REVERSE SHOULDER ARTHROPLASTY Right 10/11/2016   Procedure: REVERSE RIGHT SHOULDER ARTHROPLASTY;  Surgeon: Netta Cedars, MD;  Location: [facility];  Service: Orthopedics;  Laterality: Right;  . RHINOPLASTY     1999  . RHINOPLASTY    . SKIN CANCER EXCISION     squamous cell cancer under tongue  . SKIN SURGERY     squamous cell carcinoma removed in various places,melanoma removed from L shoulder  . sleep apnea surgery  2000  . STERIOD INJECTION Left 10/11/2016   Procedure: STEROID INJECTION LEFT SHOULDER;  Surgeon: Netta Cedars, MD;  Location: [facility];  Service: Orthopedics;  Laterality: Left;  . TOTAL HIP ARTHROPLASTY Left 10/01/2017   Procedure: LEFT TOTAL HIP ARTHROPLASTY  ANTERIOR APPROACH;  Surgeon: Gaynelle Arabian, MD;  Location: [facility];  Service: Orthopedics;  Laterality: Left;  . TOTAL KNEE ARTHROPLASTY Right 09/02/2014   Procedure: RIGHT TOTAL KNEE ARTHROPLASTY;  Surgeon: Netta Cedars, MD;  Location: [facility];  Service: Orthopedics;  Laterality: Right;    Allergies  Allergen Reactions  . Cefaclor Other (See Comments)     skin split  . Penicillins Rash    Has patient had a PCN reaction causing immediate rash, facial/tongue/throat swelling, SOB or lightheadedness with hypotension:unsure Has patient had a PCN reaction causing severe rash involving mucus membranes or skin necrosis:unsure Has patient had a PCN reaction that required hospitalization:No Has patient had a PCN reaction occurring within the last 10 years:No--childhood allergy If all of the above answers are "NO", then may proceed with Cephalosporin use.     Immunization History  Administered Date(s) Administered  . Influenza Split 02/17/2014  . Influenza,inj,Quad PF,6+ Mos 01/19/2015, 02/18/2016  . Influenza,inj,quad, With Preservative 02/17/2017  . Pneumococcal-Unspecified 05/20/2012, 02/17/2017  . Zoster 05/20/2012    Family History  Problem Relation Age of Onset  . Diverticulosis Mother   . Skin cancer Mother   . Varicose Veins Mother   . Heart attack Mother   . Hyperlipidemia Father   . Heart disease Father   . Heart attack Father   . Colon cancer Neg Hx   . Esophageal cancer Neg Hx   . Rectal cancer Neg Hx   . Stomach cancer Neg Hx      Current Outpatient Medications:  .  apixaban (ELIQUIS) 5 MG TABS tablet, Take 5 mg by mouth 2 (two) times daily. , Disp: , Rfl:  .  B Complex-C (B-COMPLEX WITH VITAMIN C) tablet, Take 1 tablet by mouth daily., Disp: , Rfl:  .  cholecalciferol (VITAMIN D) 1000 units tablet, Take 1,000 Units by mouth daily., Disp: , Rfl:  .  esomeprazole (NEXIUM) 40 MG capsule, Take 40 mg by mouth daily as needed (acid reflux). , Disp: , Rfl:  .  fenofibrate  (TRICOR) 145 MG tablet, Take 145 mg by mouth daily before breakfast. , Disp: , Rfl:  .  fluticasone (FLONASE) 50 MCG/ACT nasal spray, Place 2 sprays into both nostrils daily as needed for allergies or rhinitis., Disp: , Rfl:  .  Fluticasone-Salmeterol (ADVAIR) 100-50 MCG/DOSE AEPB, Inhale 1 puff into the lungs 2 (two) times daily as needed (for respiratory issues.)., Disp: , Rfl:  .  losartan-hydrochlorothiazide (HYZAAR) 100-12.5 MG tablet, Take 1 tablet by mouth daily., Disp: , Rfl:  .  Magnesium 100 MG TABS, Take 1 tablet by mouth daily., Disp: , Rfl:  .  NON FORMULARY, CBD Oil 1000mg  daily, Disp: , Rfl:  .  Omega-3 Fatty Acids (FISH OIL) 1000 MG CAPS, Take 1,000 mg by mouth daily., Disp: , Rfl:  .  probenecid (BENEMID) 500 MG tablet, Take 500  mg by mouth daily before breakfast. , Disp: , Rfl:  .  rosuvastatin (CRESTOR) 10 MG tablet, Take 10 mg by mouth daily., Disp: , Rfl:  .  sildenafil (VIAGRA) 25 MG tablet, Take 25 mg by mouth daily as needed for erectile dysfunction., Disp: , Rfl:  .  tamsulosin (FLOMAX) 0.4 MG CAPS capsule, Take 0.4 mg by mouth daily. , Disp: , Rfl:       Objective:   Vitals:   02/01/19 0859  BP: 130/74  Pulse: 86  Temp: 97.6 F (36.4 C)  SpO2: 96%  Weight: 180 lb 3.2 oz (81.7 kg)  Height: 5\' 10"  (1.778 m)    Estimated body mass index is 25.86 kg/m as calculated from the following:   Height as of this encounter: 5\' 10"  (1.778 m).   Weight as of this encounter: 180 lb 3.2 oz (81.7 kg).  @WEIGHTCHANGE @  [employer]   02/01/19 0859  Weight: 180 lb 3.2 oz (81.7 kg)     Physical Exam  General Appearance:    Alert, cooperative, no distress, appears stated age - yes , Deconditioned looking - no , OBESE  - no, Sitting on Wheelchair -  no  Head:    Normocephalic, without obvious abnormality, atraumatic  Eyes:    PERRL, conjunctiva/corneas clear,  Ears:    Normal TM's and external ear canals, both ears  Nose:   Nares normal, septum midline, mucosa normal,  no drainage    or sinus tenderness. OXYGEN ON  - no . Patient is @ ra   Throat:   MASK +  Neck:   Supple, symmetrical, trachea midline, no adenopathy;    thyroid:  no enlargement/tenderness/nodules; no carotid   bruit or JVD  Back:     Symmetric, no curvature, ROM normal, no CVA tenderness  Lungs:     Distress - no , Wheeze no, Barrell Chest - no, Purse lip breathing - no, Crackles - no   Chest Wall:    No tenderness or deformity.    Heart:    Regular rate and rhythm, S1 and S2 normal, no rub   or gallop, Murmur - no  Breast Exam:    NOT DONE  Abdomen:     Soft, non-tender, bowel sounds active all four quadrants,    no masses, no organomegaly. Visceral obesity - yes  Genitalia:   NOT DONE  Rectal:   NOT DONE  Extremities:   Extremities - normal, Has Cane - no, Clubbing - no, Edema - no  Pulses:   2+ and symmetric all extremities  Skin:   Stigmata of Connective Tissue Disease - no  Lymph nodes:   Cervical, supraclavicular, and axillary nodes normal  Psychiatric:  Neurologic:   Pleasant - yes, Anxious - no, Flat affect - no  CAm-ICU - neg, Alert and Oriented x 3 - yes, Moves all 4s - yes, Speech - normal, Cognition - intact           Assessment:       ICD-10-CM   1. History of pulmonary embolism  Z86.711   2. Smoking greater than 40 pack years  F17.210   3. Screening for cancer  Z12.9   4. Chronic bronchitis, unspecified chronic bronchitis type [facility])  J42        Plan:     Patient Instructions  History of pulmonary embolism -  4 years complet this august 2020 - all on full dose eliquis without recurrence - June 2017 and jan 2018 d-dimer normal  but June 2019 d-dimer elevated  -ongoing risk factor for PE is smoking and varicose veints (minor) - check d-dimer 02/01/2019 and -  and if normal we can reduce to eliquis 5mg  once daily as best balance between prevention and bleeding risk  - I can pass results to Shon Baton, MD so you and he can make a decision  Smoking and  cancer screen - do low dose CT chest wo contrast in Nov 2020 and we  will call with results - try to quit smoking  Chronic bronchitis  - continue advair as needed - CMA to update records that you had flu shot this season 2020-2021  Followup  -12 months or sooer     SIGNATURE    Dr. Brand Males, M.D., F.C.C.P,  Pulmonary and Critical Care Medicine Staff Physician, [place] Director - Interstitial Lung Disease  Program  Pulmonary [place] at [place], [place], [address]  Pager: [phone], If no answer or between  15:00h - 7:00h: call [phone] Telephone: [phone]  9:17 AM 02/01/2019

## 2019-02-02 DIAGNOSIS — J302 Other seasonal allergic rhinitis: Secondary | ICD-10-CM | POA: Diagnosis not present

## 2019-02-02 DIAGNOSIS — C069 Malignant neoplasm of mouth, unspecified: Secondary | ICD-10-CM | POA: Diagnosis not present

## 2019-02-03 ENCOUNTER — Telehealth: Payer: Self-pay | Admitting: Internal Medicine

## 2019-02-03 NOTE — Telephone Encounter (Signed)
Please let [name] know that d-dimer is normal  Plan  - I Think is ok for him to take his eliquis once daily with baby aspirin - to prevent recurrence of PE    SIGNATURE    Dr. Brand Males, M.D., F.C.C.P,  Pulmonary and Critical Care Medicine Staff Physician, [place] Director - Interstitial Lung Disease  Program  Pulmonary [place] at [place], [place], [address]  Pager: [phone], If no answer or between  15:00h - 7:00h: call [phone] Telephone: [phone]  9:52 AM 02/03/2019

## 2019-02-03 NOTE — Telephone Encounter (Signed)
Called spoke with patient, advised of lab results and recommendations as stated by MR below.  Patient voiced his understanding and denied any questions/concerns at this time.  Nothing further needed; will sign off.

## 2019-02-04 DIAGNOSIS — L570 Actinic keratosis: Secondary | ICD-10-CM | POA: Diagnosis not present

## 2019-02-04 DIAGNOSIS — D485 Neoplasm of uncertain behavior of skin: Secondary | ICD-10-CM | POA: Diagnosis not present

## 2019-02-04 DIAGNOSIS — C44729 Squamous cell carcinoma of skin of left lower limb, including hip: Secondary | ICD-10-CM | POA: Diagnosis not present

## 2019-02-11 DIAGNOSIS — L570 Actinic keratosis: Secondary | ICD-10-CM | POA: Diagnosis not present

## 2019-02-11 DIAGNOSIS — C44729 Squamous cell carcinoma of skin of left lower limb, including hip: Secondary | ICD-10-CM | POA: Diagnosis not present

## 2019-02-22 DIAGNOSIS — Z961 Presence of intraocular lens: Secondary | ICD-10-CM | POA: Diagnosis not present

## 2019-03-04 DIAGNOSIS — L570 Actinic keratosis: Secondary | ICD-10-CM | POA: Diagnosis not present

## 2019-03-04 DIAGNOSIS — D485 Neoplasm of uncertain behavior of skin: Secondary | ICD-10-CM | POA: Diagnosis not present

## 2019-03-04 DIAGNOSIS — L989 Disorder of the skin and subcutaneous tissue, unspecified: Secondary | ICD-10-CM | POA: Diagnosis not present

## 2019-03-09 DIAGNOSIS — M25512 Pain in left shoulder: Secondary | ICD-10-CM | POA: Diagnosis not present

## 2019-03-09 DIAGNOSIS — M19012 Primary osteoarthritis, left shoulder: Secondary | ICD-10-CM | POA: Diagnosis not present

## 2019-03-15 ENCOUNTER — Other Ambulatory Visit: Payer: Self-pay | Admitting: *Deleted

## 2019-03-15 DIAGNOSIS — Z87891 Personal history of nicotine dependence: Secondary | ICD-10-CM

## 2019-03-15 DIAGNOSIS — F17210 Nicotine dependence, cigarettes, uncomplicated: Secondary | ICD-10-CM

## 2019-03-15 DIAGNOSIS — Z122 Encounter for screening for malignant neoplasm of respiratory organs: Secondary | ICD-10-CM

## 2019-03-18 ENCOUNTER — Telehealth: Payer: Self-pay

## 2019-03-18 NOTE — Telephone Encounter (Signed)
-----   Message from Osvaldo Shipper, [place] sent at 03/18/2019  4:01 PM EDT ----- Regarding: CT need New order and to be Resch Please put in new order and reschedule patient at another facility. We are unable to do CT because of insurance. DR. Chase Caller patient scheduled for CT Chest Lung CA Screening 11/10  Pt is aware appt was canceled.  Thank you   Erline Levine

## 2019-03-18 NOTE — Telephone Encounter (Signed)
It appears the patient had the same Imaging scheduled for 11/5 at [facility]. If this still requires another order. Please let us know.

## 2019-03-19 DIAGNOSIS — C069 Malignant neoplasm of mouth, unspecified: Secondary | ICD-10-CM | POA: Diagnosis not present

## 2019-03-19 DIAGNOSIS — K1370 Unspecified lesions of oral mucosa: Secondary | ICD-10-CM | POA: Diagnosis not present

## 2019-03-19 NOTE — Telephone Encounter (Signed)
No Thank you I had Langley Gauss to put in a new CT when she was here on Monday 03/15/2019. So we don't need another CT order to schedule it has already been taken care of

## 2019-03-25 ENCOUNTER — Other Ambulatory Visit: Payer: Self-pay

## 2019-03-25 ENCOUNTER — Ambulatory Visit
Admission: RE | Admit: 2019-03-25 | Discharge: 2019-03-25 | Disposition: A | Discharge status: Home / Self Care | Payer: Medicare Other | Source: Ambulatory Visit | Attending: Acute Care | Admitting: Acute Care

## 2019-03-25 DIAGNOSIS — F17210 Nicotine dependence, cigarettes, uncomplicated: Secondary | ICD-10-CM | POA: Diagnosis not present

## 2019-03-25 DIAGNOSIS — Z87891 Personal history of nicotine dependence: Secondary | ICD-10-CM

## 2019-03-25 DIAGNOSIS — Z122 Encounter for screening for malignant neoplasm of respiratory organs: Secondary | ICD-10-CM

## 2019-03-25 IMAGING — CT CT CHEST LUNG CANCER SCREENING LOW DOSE W/O CM
1 of 3 series · 10 of 40 positions shown, 13 images · non-contrast
Comparison: [DATE]

CLINICAL DATA: 78-year-old current smoker with 51 pack-year
history. Lung cancer screening.

EXAM:
CT CHEST WITHOUT CONTRAST LOW-DOSE FOR LUNG CANCER SCREENING
TECHNIQUE: Multidetector CT imaging of the chest was performed following the
standard protocol without IV contrast.

[ct lung segmentation data · axial · 0.74mm/px · z∈[-353,-353]mm · 10 of 317 frames shown]
[frame 1/317  mediastinal]
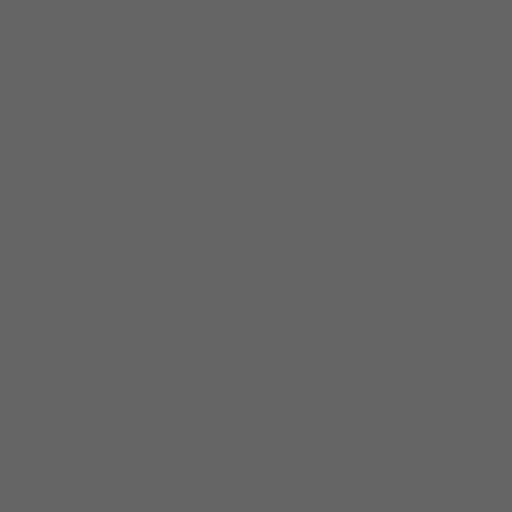
[frame 1/317  lung]
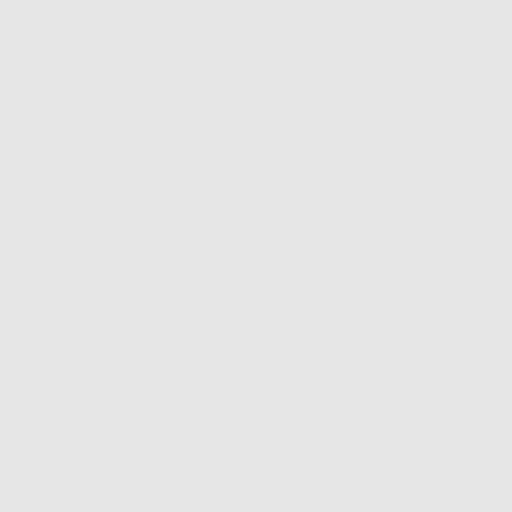
[frame 36/317  lung]
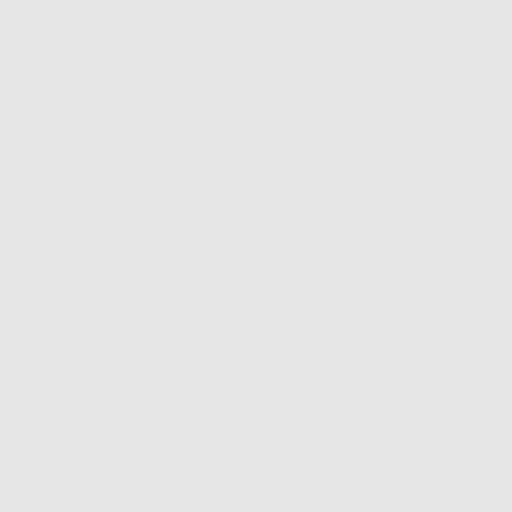
[frame 71/317  lung]
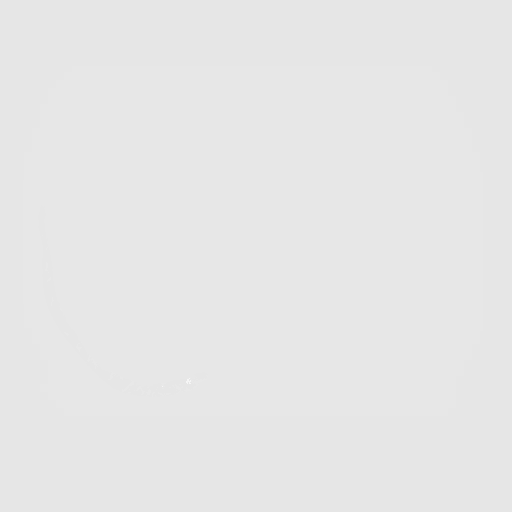
[frame 106/317  lung]
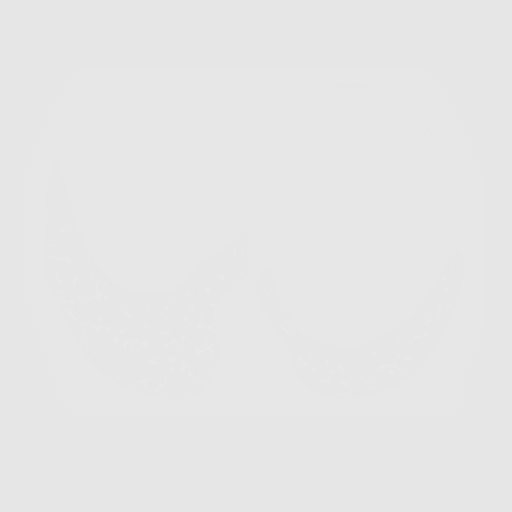
[frame 141/317  mediastinal]
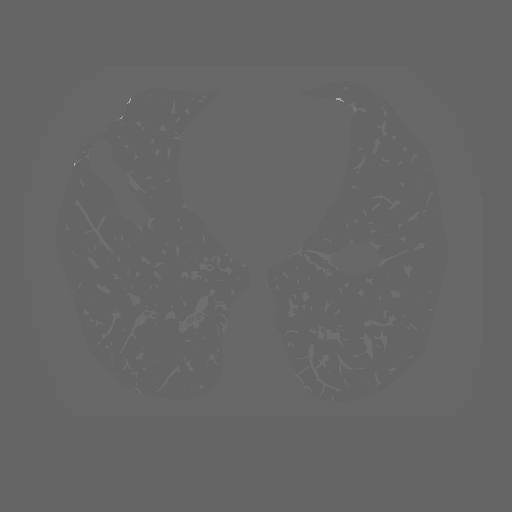
[frame 141/317  lung]
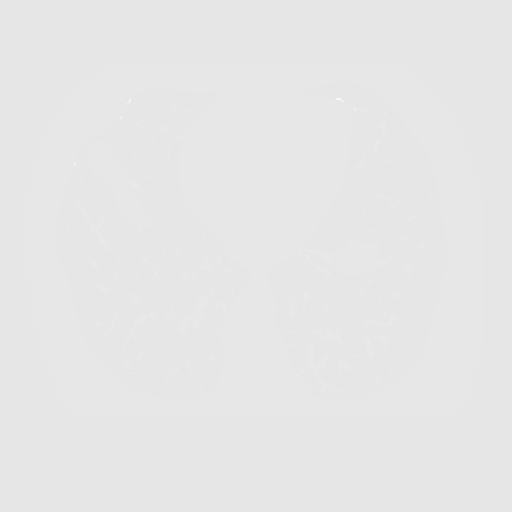
[frame 176/317  lung]
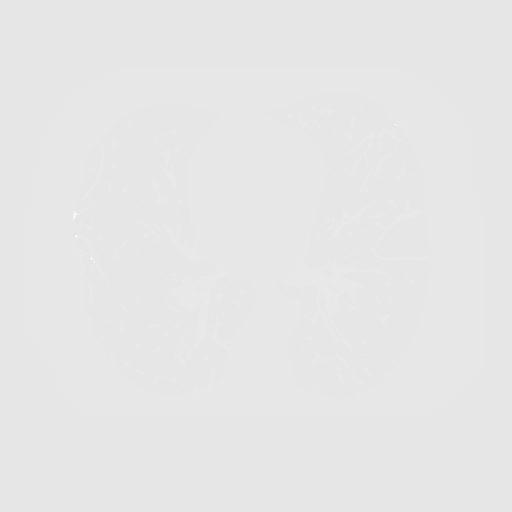
[frame 211/317  lung]
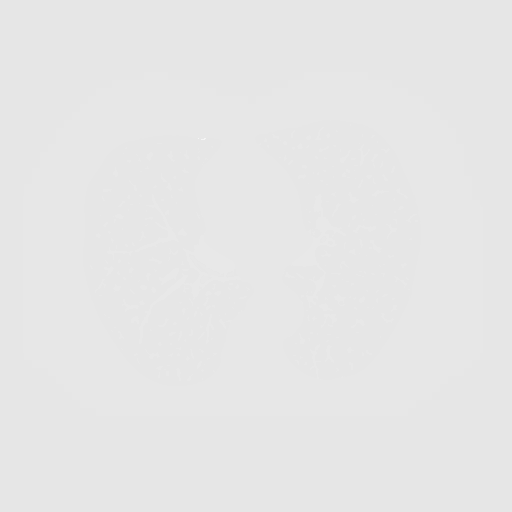
[frame 246/317  lung]
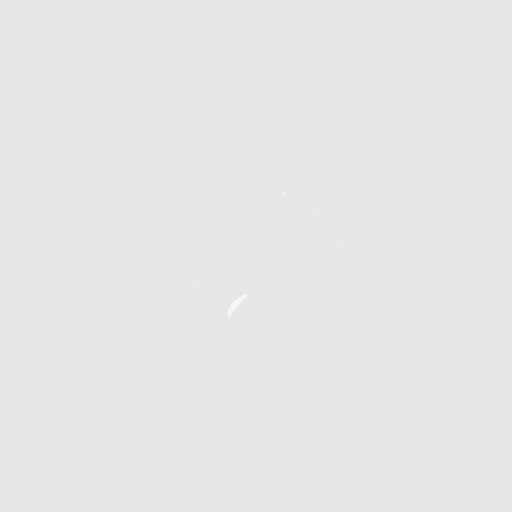
[frame 281/317  mediastinal]
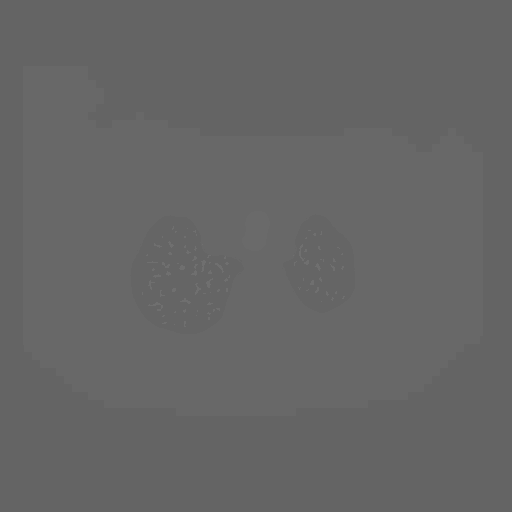
[frame 281/317  lung]
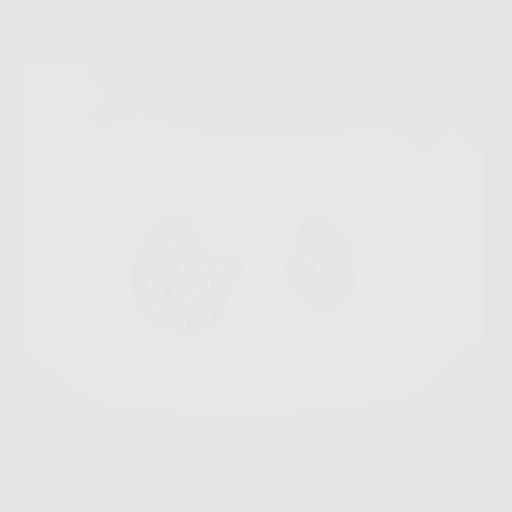
[frame 317/317  lung]
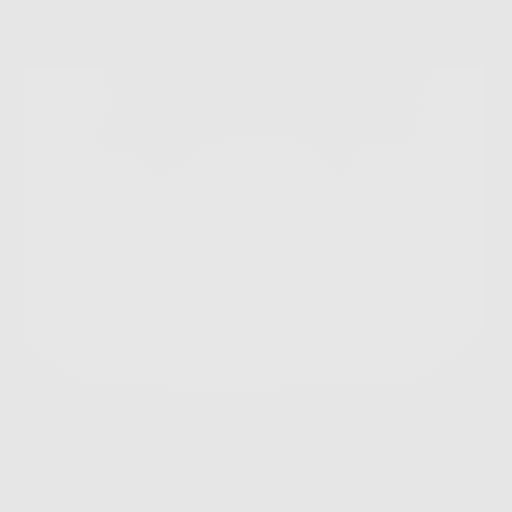

[10 of 40 positions shown; findings below may reference images not displayed]

FINDINGS: Cardiovascular: The heart size is normal. No substantial pericardial
effusion. Coronary artery calcification is evident. Ascending
thoracic aorta measures up to 4.2 cm diameter.

Mediastinum/Nodes: No mediastinal lymphadenopathy. No evidence for
gross hilar lymphadenopathy although assessment is limited by the
lack of intravenous contrast on today's study. The esophagus has
normal imaging features. There is no axillary lymphadenopathy.

Lungs/Pleura: Centrilobular emphsyema noted. Subsegmental
atelectasis and/or scarring in the lingula is stable. Right apical
calcified granuloma again noted. No new suspicious pulmonary nodule
or mass. No focal consolidation. No pleural effusion.

Upper Abdomen: Small bilateral adrenal nodules are stable and
attenuation of these nodules remains compatible with adrenal
adenomas. 1.7 cm subcapsular low-density lesion in the anterior
liver is stable. 5 mm hypodensity in the dome of the left liver was
not well seen on the prior screening CT but is stable since an
abdomen CT [DATE] consistent with benign etiology.

Musculoskeletal: No worrisome lytic or sclerotic osseous
abnormality.
IMPRESSION: 1. Lung-RADS Category 1, negative. Continue annual screening with
low-dose chest CT without contrast in 12 months.
2. Ascending thoracic aortic aneurysm is stable at 4.3 cm diameter.
Recommend annual imaging followup by CTA or MRA. This recommendation
follows [MA] ACCF/AHA/AATS/ACR/ASA/SCA/SIKITO/SIKITO/SIKITO/SIKITO Guidelines
for the Diagnosis and Management of Patients with Thoracic Aortic
Disease. Circulation. [MA]; 121: E266-e369. Aortic aneurysm NOS
([MA]-[MA])
3. Coronary artery atherosclerosis.
4. Bilateral adrenal adenomas.
5. Emphysema ([MA]-[MA]).

## 2019-03-30 ENCOUNTER — Inpatient Hospital Stay: Admission: RE | Admit: 2019-03-30 | Payer: Medicare Other | Source: Ambulatory Visit

## 2019-04-01 ENCOUNTER — Telehealth: Payer: Self-pay | Admitting: Acute Care

## 2019-04-01 DIAGNOSIS — Z87891 Personal history of nicotine dependence: Secondary | ICD-10-CM

## 2019-04-01 DIAGNOSIS — F17210 Nicotine dependence, cigarettes, uncomplicated: Secondary | ICD-10-CM

## 2019-04-01 DIAGNOSIS — Z122 Encounter for screening for malignant neoplasm of respiratory organs: Secondary | ICD-10-CM

## 2019-04-01 NOTE — Telephone Encounter (Signed)
Pt informed of CT results per Sarah Groce, NP.  PT verbalized understanding.  Copy sent to PCP.  Order placed for 1 yr f/u CT.  

## 2019-04-07 DIAGNOSIS — D485 Neoplasm of uncertain behavior of skin: Secondary | ICD-10-CM | POA: Diagnosis not present

## 2019-04-07 DIAGNOSIS — Z85820 Personal history of malignant melanoma of skin: Secondary | ICD-10-CM | POA: Diagnosis not present

## 2019-04-07 DIAGNOSIS — L570 Actinic keratosis: Secondary | ICD-10-CM | POA: Diagnosis not present

## 2019-04-07 DIAGNOSIS — Z85828 Personal history of other malignant neoplasm of skin: Secondary | ICD-10-CM | POA: Diagnosis not present

## 2019-04-07 DIAGNOSIS — D1801 Hemangioma of skin and subcutaneous tissue: Secondary | ICD-10-CM | POA: Diagnosis not present

## 2019-04-07 DIAGNOSIS — L814 Other melanin hyperpigmentation: Secondary | ICD-10-CM | POA: Diagnosis not present

## 2019-04-07 DIAGNOSIS — L819 Disorder of pigmentation, unspecified: Secondary | ICD-10-CM | POA: Diagnosis not present

## 2019-04-07 DIAGNOSIS — D229 Melanocytic nevi, unspecified: Secondary | ICD-10-CM | POA: Diagnosis not present

## 2019-04-07 DIAGNOSIS — L821 Other seborrheic keratosis: Secondary | ICD-10-CM | POA: Diagnosis not present

## 2019-04-08 DIAGNOSIS — C44729 Squamous cell carcinoma of skin of left lower limb, including hip: Secondary | ICD-10-CM | POA: Diagnosis not present

## 2019-04-08 DIAGNOSIS — B079 Viral wart, unspecified: Secondary | ICD-10-CM | POA: Diagnosis not present

## 2019-04-29 ENCOUNTER — Other Ambulatory Visit: Payer: Self-pay

## 2019-04-29 ENCOUNTER — Ambulatory Visit (HOSPITAL_COMMUNITY): Payer: Medicare Other | Attending: Cardiology

## 2019-04-29 DIAGNOSIS — I350 Nonrheumatic aortic (valve) stenosis: Secondary | ICD-10-CM

## 2019-05-04 ENCOUNTER — Other Ambulatory Visit: Payer: Self-pay

## 2019-05-04 ENCOUNTER — Encounter: Payer: Self-pay | Admitting: Cardiovascular Disease

## 2019-05-04 ENCOUNTER — Ambulatory Visit (INDEPENDENT_AMBULATORY_CARE_PROVIDER_SITE_OTHER): Payer: Medicare Other | Admitting: Cardiovascular Disease

## 2019-05-04 VITALS — BP 121/78 | HR 83 | Temp 97.0°F | Ht 69.0 in | Wt 176.4 lb

## 2019-05-04 DIAGNOSIS — I2510 Atherosclerotic heart disease of native coronary artery without angina pectoris: Secondary | ICD-10-CM | POA: Diagnosis not present

## 2019-05-04 DIAGNOSIS — I7121 Aneurysm of the ascending aorta, without rupture: Secondary | ICD-10-CM

## 2019-05-04 DIAGNOSIS — I712 Thoracic aortic aneurysm, without rupture, unspecified: Secondary | ICD-10-CM

## 2019-05-04 DIAGNOSIS — E7800 Pure hypercholesterolemia, unspecified: Secondary | ICD-10-CM

## 2019-05-04 DIAGNOSIS — I10 Essential (primary) hypertension: Secondary | ICD-10-CM | POA: Diagnosis not present

## 2019-05-04 DIAGNOSIS — Z01812 Encounter for preprocedural laboratory examination: Secondary | ICD-10-CM

## 2019-05-04 HISTORY — DX: Thoracic aortic aneurysm, without rupture, unspecified: I71.20

## 2019-05-04 HISTORY — DX: Thoracic aortic aneurysm, without rupture: I71.2

## 2019-05-04 LAB — BASIC METABOLIC PANEL
BUN/Creatinine Ratio: 14 (ref 10–24)
BUN: 17 mg/dL (ref 8–27)
CO2: 21 mmol/L (ref 20–29)
Calcium: 9.1 mg/dL (ref 8.6–10.2)
Chloride: 100 mmol/L (ref 96–106)
Creatinine, Ser: 1.23 mg/dL (ref 0.76–1.27)
GFR calc Af Amer: 66 mL/min/{1.73_m2} (ref >59)
GFR calc non Af Amer: 57 mL/min/{1.73_m2} — ABNORMAL LOW (ref >59)
Glucose: 81 mg/dL (ref 65–99)
Potassium: 4.3 mmol/L (ref 3.5–5.2)
Sodium: 139 mmol/L (ref 134–144)

## 2019-05-04 NOTE — Patient Instructions (Signed)
Medication Instructions:  Your physician recommends that you continue on your current medications as directed. Please refer to the Current Medication list given to you today.  *If you need a refill on your cardiac medications before your next appointment, please call your pharmacy*  Lab Work: BMET TODAY   If you have labs (blood work) drawn today and your tests are completely normal, you will receive your results only by: Marland Kitchen MyChart Message (if you have MyChart) OR . A paper copy in the mail If you have any lab test that is abnormal or we need to change your treatment, we will call you to review the results.  Testing/Procedures: CTA OF CHEST AND ABDOMEN  THE OFFICE WILL CALL YOU TO SCHEDULE   Follow-Up: At [facility], you and your health needs are our priority.  As part of our continuing mission to provide you with exceptional heart care, we have created designated Provider Care Teams.  These Care Teams include your primary Cardiologist (physician) and Advanced Practice Providers (APPs -  Physician Assistants and Nurse Practitioners) who all work together to provide you with the care you need, when you need it.  Your next appointment:   6 month(s)  The format for your next appointment:   Either In Person or Virtual  Provider:   You may see DR [facility] or one of the following Advanced Practice Providers on your designated Care Team:    Rosaria Ferries, PA-C  Jory Sims, DNP, ANP  Cadence Kathlen Mody, NP

## 2019-05-04 NOTE — Progress Notes (Signed)
Cardiology Office Note   Date:  05/04/2019   ID:  Derek, Bender [birth year], MRN [id number]  PCP:  Shon Baton, MD  Cardiologist:   Skeet Latch, MD   No chief complaint on file.     History of Present Illness: [name] is a 78 y.o. male withasymptomatic coronary calcification, mild aortic stenosis, ascending aortic aneurysm, carotid stenosis status post right CEA,hypertension,emphysema,prior PE,multiple skin cancers,and GI bleedhere for follow up. He was initially seen 03/2018 for the evaluation ofcoronary calcification.  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 This occurred in the setting of LE DVT. He had a follow up chest CT 03/16/18 that revealed atherosclerosis of the aorta and all three coronary vessels including the left main. 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 He was feeling well but didn't get much formal exercise so he was referred for a Lexiscan Myoview 04/2018 that revealed no ischemia.  TID ratio was 1.25.  He was also noted to have a systolic murmur and had an echo that revealed [id number], grade 1 diastolic dysfunction and mild aortic stenosis (mean gradient 14 mmHg).  Repeat echo 04/2019 the mean gradient was 18 mmHg.  The ascending aorta was 4.6 cm.  Since his last appointment [name] has been well.  He walks for exercise daily and has no chest pain or shortness of breath.  He denies any lower extremity edema, orthopnea, or PND.  His only complaint is left shoulder pain.  He continues to like to scuba dive.  He has a dive planned for March.  He has been otherwise well and is without complaint.  He had an episode of his legs getting tired when walking and had lower extremity Dopplers that were normal 12/2018.   Past Medical History:  Diagnosis Date  . Anxiety    reports scotch helps that   . Aortic stenosis, mild 08/18/2018   Mean gradient 14 mmHg 04/2018  . Arthritis    " it's everywhere"  . Blood transfusion  without reported diagnosis   . Cataract    having cataract surgery July 2016  . Chronic kidney disease    hx kidney stones  . Colon polyps   . Complication of anesthesia    after surgery 07-2013 unable to sleep for 3 days  . Coronary artery calcification seen on CAT scan 08/18/2018  . GERD (gastroesophageal reflux disease)   . History of kidney stones   . Hyperlipidemia   . Hypertension   . Prostate hypertrophy   . Pulmonary embolism ([place]) 12/2014   post TJR  . Seasonal allergies   . 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 Sleep apnea    had surgery in 2000 for sleep apnea, no CPAP needed  . Thoracic aortic aneurysm ([place]) 05/04/2019   4.6 cm 04/2019    Past Surgical History:  Procedure Laterality Date  . CAROTID ENDARTERECTOMY Right Oct 01, 2013   CE  . COLONOSCOPY    . ENDARTERECTOMY Right 10/01/2013   Procedure: ENDARTERECTOMY CAROTID-RIGHT;  Surgeon: Rosetta Posner, MD;  Location: [place];  Service: Vascular;  Laterality: Right;  . ESOPHAGOGASTRODUODENOSCOPY ENDOSCOPY  10-12-2013  . EXCISION ORAL LESION WITH CO2 LASER N/A 09/08/2015   Procedure: EXCISION ORAL LESION WITH CO2 LASER;  Surgeon: Jerrell Belfast, MD;  Location: [facility];  Service: ENT;  Laterality: N/A;  . EYE  SURGERY Bilateral    cataracts, W/IOL  . JOINT REPLACEMENT Right September 02, 2014   Knee  . JOINT REPLACEMENT Left 2012   Knee  . PATCH ANGIOPLASTY Right 10/01/2013   Procedure: PATCH ANGIOPLASTY USING HEMASHIELD FINESSE PATCH;  Surgeon: Rosetta Posner, MD;  Location: [facility];  Service: Vascular;  Laterality: Right;  . removal of melonomia Left    shoulder  . REPLACEMENT TOTAL KNEE Left   . REVERSE SHOULDER ARTHROPLASTY Right 10/11/2016   Procedure: REVERSE RIGHT SHOULDER ARTHROPLASTY;  Surgeon: Netta Cedars, MD;  Location: [facility];  Service: Orthopedics;  Laterality: Right;  . RHINOPLASTY     1999  . RHINOPLASTY    . SKIN CANCER  EXCISION     squamous cell cancer under tongue  . SKIN SURGERY     squamous cell carcinoma removed in various places,melanoma removed from L shoulder  . sleep apnea surgery  2000  . STERIOD INJECTION Left 10/11/2016   Procedure: STEROID INJECTION LEFT SHOULDER;  Surgeon: Netta Cedars, MD;  Location: [facility];  Service: Orthopedics;  Laterality: Left;  . TOTAL HIP ARTHROPLASTY Left 10/01/2017   Procedure: LEFT TOTAL HIP ARTHROPLASTY ANTERIOR APPROACH;  Surgeon: Gaynelle Arabian, MD;  [facility];  Service: Orthopedics;  Laterality: Left;  . TOTAL KNEE ARTHROPLASTY Right 09/02/2014   Procedure: RIGHT TOTAL KNEE ARTHROPLASTY;  Surgeon: Netta Cedars, MD;  Location: [facility];  Service: Orthopedics;  Laterality: Right;     Current Outpatient Medications  Medication Sig Dispense Refill  . apixaban (ELIQUIS) 5 MG TABS tablet Take 5 mg by mouth 2 (two) times daily.     . B Complex-C (B-COMPLEX WITH VITAMIN C) tablet Take 1 tablet by mouth daily.    . cholecalciferol (VITAMIN D) 1000 units tablet Take 1,000 Units by mouth daily.    Marland Kitchen esomeprazole (NEXIUM) 40 MG capsule Take 40 mg by mouth daily as needed (acid reflux).     . fenofibrate (TRICOR) 145 MG tablet Take 145 mg by mouth daily before breakfast.     . fluticasone (FLONASE) 50 MCG/ACT nasal spray Place 2 sprays into both nostrils daily as needed for allergies or rhinitis.    . Fluticasone-Salmeterol (ADVAIR) 100-50 MCG/DOSE AEPB Inhale 1 puff into the lungs 2 (two) times daily as needed (for respiratory issues.).    Marland Kitchen losartan-hydrochlorothiazide (HYZAAR) 100-12.5 MG tablet Take 1 tablet by mouth daily.    . Magnesium 100 MG TABS Take 1 tablet by mouth daily.    . NON FORMULARY CBD Oil 1000mg  daily    . Omega-3 Fatty Acids (FISH OIL) 1000 MG CAPS Take 1,000 mg by mouth daily.    . probenecid (BENEMID) 500 MG tablet Take 500 mg by mouth daily before breakfast.     . rosuvastatin (CRESTOR) 10 MG tablet Take 10 mg by mouth daily.    . sildenafil  (VIAGRA) 25 MG tablet Take 25 mg by mouth daily as needed for erectile dysfunction.    . tamsulosin (FLOMAX) 0.4 MG CAPS capsule Take 0.4 mg by mouth daily.      No current facility-administered medications for this visit.    Allergies:   Cefaclor and Penicillins    Social History:  The patient  reports that he has been smoking cigars. He has smoked for the past 50.00 years. He has never used smokeless tobacco. He reports current alcohol use of about 21.0 standard drinks of alcohol per week. He reports that he does not use drugs.   Family History:  The  patient's family history includes Diverticulosis in his mother; Heart attack in his father and mother; Heart disease in his father; Hyperlipidemia in his father; Skin cancer in his mother; Varicose Veins in his mother.    ROS:  Please see the history of present illness.   Otherwise, review of systems are positive for none.   All other systems are reviewed and negative.    PHYSICAL EXAM: VS:  BP 121/78   Pulse 83   Temp (!) 97 F (36.1 C)   Ht 5\' 9"  (1.753 m)   Wt 176 lb 6.4 oz (80 kg)   SpO2 97%   BMI 26.05 kg/m  , BMI Body mass index is 26.05 kg/m. GENERAL:  Well appearing HEENT:  Pupils equal round and reactive, fundi not visualized, oral mucosa unremarkable NECK:  No jugular venous distention, waveform within normal limits, carotid upstroke brisk and symmetric, no bruits, no thyromegaly LYMPHATICS:  No cervical adenopathy LUNGS:  Clear to auscultation bilaterally HEART:  RRR.  PMI not displaced or sustained,S1 and S2 within normal limits, no S3, no S4, no clicks, no rubs, III/VI mid-peaking systolic murmur at the LUSB ABD:  Flat, positive bowel sounds normal in frequency in pitch, no bruits, no rebound, no guarding, no midline pulsatile mass, no hepatomegaly, no splenomegaly EXT:  2 plus pulses throughout, no edema, no cyanosis no clubbing SKIN:  No rashes no nodules NEURO:  Cranial nerves II through XII grossly intact, motor  grossly intact throughout PSYCH:  Cognitively intact, oriented to person place and time    EKG:  EKG is ordered today. The ekg ordered today demonstrates sinus rhythm.  Rate 83 bpm.  Lexiscan Myoview 04/20/18:  Nuclear stress EF: 50%.  There was no ST segment deviation noted during stress.  No T wave inversion was noted during stress.  The study is normal.  This is a low risk study.  The left ventricular ejection fraction is mildly decreased (45-54%). Abnormal TID ratio of 1.25 indicates a possibility of balanced ischemia. Though the specificity of this finding is limited in a pharmacologic stress test compared with an exercise stress test.  Echo 04/20/18: Study Conclusions  - Left ventricle: The cavity size was normal. Wall thickness was increased in a pattern of mild LVH. Systolic function was normal. The estimated ejection fraction was in the range of 60% to 65%. Doppler parameters are consistent with abnormal left ventricular relaxation (grade 1 diastolic dysfunction). - Aortic valve: Trileaflet; severely calcified leaflets. There was mild to moderate stenosis. Mean gradient (S): 14 mm Hg. Valve area (VTI): 1.49 cm^2. - Ascending aorta: The ascending aorta was mildly dilated to 40 mm. - Mitral valve: Moderately calcified annulus. There was trivial regurgitation. - Right ventricle: The cavity size was normal. Systolic function was normal. - Pulmonary arteries: No complete TR doppler jet so unable to estimate PA systolic pressure. - Inferior vena cava: The vessel was normal in size. The respirophasic diameter changes were in the normal range (= 50%), consistent with normal central venous pressure.  Impressions:  - Normal LV size with mild LV hypertrophy. EF 60-65%. Normal RV size and systolic function. Mild to moderate AS with mean gradient 14 mmHg and AVA 1.49 cm^2.   Recent Labs: 07/21/2018: BUN 26; Creatinine, Ser 1.54; Potassium  4.2; Sodium 139    Lipid Panel No results found for: CHOL, TRIG, HDL, CHOLHDL, VLDL, LDLCALC, LDLDIRECT    Wt Readings from Last 3 Encounters:  05/04/19 176 lb 6.4 oz (80 kg)  02/01/19 180 lb  3.2 oz (81.7 kg)  01/09/19 190 lb (86.2 kg)      ASSESSMENT AND PLAN:  # Coronary calcifications: # Hyperlipidemia:   Asymptomatic.  Stress test was negative 04/2018.  We continue the importance stressed the importance of diet and exercise.  Lipids are followed by Dr. Virgina Jock.  LDL goal <70.  No aspirin given that he is on Eliquis.    # Ascending aorta aneurysm: 4.6 cm on echo 04/2019 increased from 4.0 cm 04/2018.  We will get a CT angiogram of the chest and abdomen to evaluate that aorta.  Likely add a beta-blocker.  We will need to consider whether it is safe for him to continue scuba diving.  # Tobacco: Patient is not interested in stopping his cigars. Cessation was advised.  # Hypertension:Blood pressure remains well-controlled on losartan/HCTZ.   # Mild aortic stenosis: Mean gradient was 18 mmHg on echo 04/2019.    Current medicines are reviewed at length with the patient today.  The patient does not have concerns regarding medicines.  The following changes have been made:  no change  Labs/ tests ordered today include:   Orders Placed This Encounter  Procedures  . Basic metabolic panel  . EKG 12-Lead     Disposition:   FU with Asif Muchow C. Oval Linsey, MD, [facility] in 6 months.      Signed, Travonta Gill C. Oval Linsey, MD, [facility]  05/04/2019 9:50 AM    [place]

## 2019-05-18 ENCOUNTER — Ambulatory Visit
Admission: RE | Admit: 2019-05-18 | Discharge: 2019-05-18 | Disposition: A | Discharge status: Home / Self Care | Payer: Medicare Other | Source: Ambulatory Visit | Attending: Cardiovascular Disease | Admitting: Cardiovascular Disease

## 2019-05-18 ENCOUNTER — Other Ambulatory Visit: Payer: Self-pay

## 2019-05-18 DIAGNOSIS — E7800 Pure hypercholesterolemia, unspecified: Secondary | ICD-10-CM

## 2019-05-18 DIAGNOSIS — I2510 Atherosclerotic heart disease of native coronary artery without angina pectoris: Secondary | ICD-10-CM

## 2019-05-18 DIAGNOSIS — I10 Essential (primary) hypertension: Secondary | ICD-10-CM

## 2019-05-18 DIAGNOSIS — I7121 Aneurysm of the ascending aorta, without rupture: Secondary | ICD-10-CM

## 2019-05-18 DIAGNOSIS — I712 Thoracic aortic aneurysm, without rupture: Secondary | ICD-10-CM

## 2019-05-18 DIAGNOSIS — K5730 Diverticulosis of large intestine without perforation or abscess without bleeding: Secondary | ICD-10-CM | POA: Diagnosis not present

## 2019-05-18 DIAGNOSIS — I714 Abdominal aortic aneurysm, without rupture: Secondary | ICD-10-CM | POA: Diagnosis not present

## 2019-05-18 MED ORDER — IOPAMIDOL (ISOVUE-370) INJECTION 76%
75.0000 mL | Freq: Once | INTRAVENOUS | Status: AC | PRN
  Administered 2019-05-18: 75 mL via INTRAVENOUS

## 2019-05-19 DIAGNOSIS — C069 Malignant neoplasm of mouth, unspecified: Secondary | ICD-10-CM | POA: Diagnosis not present

## 2019-05-19 DIAGNOSIS — J302 Other seasonal allergic rhinitis: Secondary | ICD-10-CM | POA: Diagnosis not present

## 2019-05-19 DIAGNOSIS — K1370 Unspecified lesions of oral mucosa: Secondary | ICD-10-CM | POA: Diagnosis not present

## 2019-05-26 DIAGNOSIS — L281 Prurigo nodularis: Secondary | ICD-10-CM | POA: Diagnosis not present

## 2019-05-26 DIAGNOSIS — D485 Neoplasm of uncertain behavior of skin: Secondary | ICD-10-CM | POA: Diagnosis not present

## 2019-05-26 DIAGNOSIS — L570 Actinic keratosis: Secondary | ICD-10-CM | POA: Diagnosis not present

## 2019-05-26 DIAGNOSIS — L821 Other seborrheic keratosis: Secondary | ICD-10-CM | POA: Diagnosis not present

## 2019-05-31 ENCOUNTER — Ambulatory Visit: Payer: Medicare Other | Attending: Internal Medicine

## 2019-05-31 DIAGNOSIS — Z23 Encounter for immunization: Secondary | ICD-10-CM | POA: Diagnosis not present

## 2019-05-31 NOTE — Progress Notes (Signed)
   Covid-19 Vaccination Clinic  Name:  PAGE RAMAGLIA    MRN: [id number] DOB: [birth year]  05/31/2019  Mr. Abdulmalik was observed post Covid-19 immunization for 30 minutes based on pre-vaccination screening without incidence. He was provided with Vaccine Information Sheet and instruction to access the V-Safe system.   Mr. Ferrufino was instructed to call 911 with any severe reactions post vaccine: Marland Kitchen Difficulty breathing  . Swelling of your face and throat  . A fast heartbeat  . A bad rash all over your body  . Dizziness and weakness    Immunizations Administered    Name Date Dose VIS Date Route   Pfizer COVID-19 Vaccine 05/31/2019  9:50 AM 0.3 mL 04/30/2019 Intramuscular   Manufacturer: [employer], [employer]   Lot: [id number]   [place]: [id number]

## 2019-06-02 DIAGNOSIS — C44729 Squamous cell carcinoma of skin of left lower limb, including hip: Secondary | ICD-10-CM | POA: Diagnosis not present

## 2019-06-08 DIAGNOSIS — M25512 Pain in left shoulder: Secondary | ICD-10-CM | POA: Diagnosis not present

## 2019-06-14 ENCOUNTER — Telehealth: Payer: Self-pay | Admitting: *Deleted

## 2019-06-14 DIAGNOSIS — I7121 Aneurysm of the ascending aorta, without rupture: Secondary | ICD-10-CM

## 2019-06-14 DIAGNOSIS — I712 Thoracic aortic aneurysm, without rupture: Secondary | ICD-10-CM

## 2019-06-14 NOTE — Telephone Encounter (Signed)
-----   Message from Skeet Latch, MD sent at 06/13/2019  5:48 AM EST ----- Mild aneurysms noted in the ascending and descending aorta.  Ascending aorta is not as large as we previously thought.  Echo in one year and abdominal ultrasound in 3 years.  OK to dive but avoid heavy lifting.

## 2019-06-14 NOTE — Telephone Encounter (Signed)
Released in my chart with Dr Blenda Mounts comments attached  Order in epic for echo

## 2019-06-16 DIAGNOSIS — C44722 Squamous cell carcinoma of skin of right lower limb, including hip: Secondary | ICD-10-CM | POA: Diagnosis not present

## 2019-06-20 ENCOUNTER — Ambulatory Visit: Payer: Medicare Other | Attending: Internal Medicine

## 2019-06-20 DIAGNOSIS — Z23 Encounter for immunization: Secondary | ICD-10-CM

## 2019-06-20 NOTE — Progress Notes (Signed)
   Covid-19 Vaccination Clinic  Name:  [name]    MRN: [id number] DOB: 1941/02/19  06/20/2019  Mr. [name] was observed post Covid-19 immunization for 15 minutes without incidence. He was provided with Vaccine Information Sheet and instruction to access the V-Safe system.   Mr. [name] was instructed to call 911 with any severe reactions post vaccine: Marland Kitchen Difficulty breathing  . Swelling of your face and throat  . A fast heartbeat  . A bad rash all over your body  . Dizziness and weakness    Immunizations Administered    Name Date Dose VIS Date Route   Pfizer COVID-19 Vaccine 06/20/2019  9:34 AM 0.3 mL 04/30/2019 Intramuscular   Manufacturer: [place]   Lot: [id number]   [place]: [id number]

## 2019-06-29 DIAGNOSIS — D485 Neoplasm of uncertain behavior of skin: Secondary | ICD-10-CM | POA: Diagnosis not present

## 2019-06-29 DIAGNOSIS — L281 Prurigo nodularis: Secondary | ICD-10-CM | POA: Diagnosis not present

## 2019-06-29 DIAGNOSIS — L570 Actinic keratosis: Secondary | ICD-10-CM | POA: Diagnosis not present

## 2019-07-05 DIAGNOSIS — L570 Actinic keratosis: Secondary | ICD-10-CM | POA: Diagnosis not present

## 2019-07-12 DIAGNOSIS — N401 Enlarged prostate with lower urinary tract symptoms: Secondary | ICD-10-CM | POA: Diagnosis not present

## 2019-07-12 DIAGNOSIS — R3912 Poor urinary stream: Secondary | ICD-10-CM | POA: Diagnosis not present

## 2019-07-12 DIAGNOSIS — N5201 Erectile dysfunction due to arterial insufficiency: Secondary | ICD-10-CM | POA: Diagnosis not present

## 2019-07-12 DIAGNOSIS — R3914 Feeling of incomplete bladder emptying: Secondary | ICD-10-CM | POA: Diagnosis not present

## 2019-07-16 DIAGNOSIS — D3500 Benign neoplasm of unspecified adrenal gland: Secondary | ICD-10-CM | POA: Diagnosis not present

## 2019-07-16 DIAGNOSIS — M1990 Unspecified osteoarthritis, unspecified site: Secondary | ICD-10-CM | POA: Diagnosis not present

## 2019-07-16 DIAGNOSIS — I739 Peripheral vascular disease, unspecified: Secondary | ICD-10-CM | POA: Diagnosis not present

## 2019-07-16 DIAGNOSIS — I350 Nonrheumatic aortic (valve) stenosis: Secondary | ICD-10-CM | POA: Diagnosis not present

## 2019-07-16 DIAGNOSIS — I700 Atherosclerosis of aorta: Secondary | ICD-10-CM | POA: Diagnosis not present

## 2019-07-16 DIAGNOSIS — C4492 Squamous cell carcinoma of skin, unspecified: Secondary | ICD-10-CM | POA: Insufficient documentation

## 2019-07-16 DIAGNOSIS — E669 Obesity, unspecified: Secondary | ICD-10-CM | POA: Diagnosis not present

## 2019-07-16 DIAGNOSIS — I2510 Atherosclerotic heart disease of native coronary artery without angina pectoris: Secondary | ICD-10-CM | POA: Diagnosis not present

## 2019-07-16 DIAGNOSIS — K7689 Other specified diseases of liver: Secondary | ICD-10-CM | POA: Diagnosis not present

## 2019-07-16 DIAGNOSIS — J439 Emphysema, unspecified: Secondary | ICD-10-CM | POA: Diagnosis not present

## 2019-07-16 DIAGNOSIS — N1831 Chronic kidney disease, stage 3a: Secondary | ICD-10-CM | POA: Insufficient documentation

## 2019-07-16 DIAGNOSIS — K635 Polyp of colon: Secondary | ICD-10-CM | POA: Diagnosis not present

## 2019-07-16 DIAGNOSIS — N401 Enlarged prostate with lower urinary tract symptoms: Secondary | ICD-10-CM | POA: Diagnosis not present

## 2019-07-18 NOTE — Progress Notes (Signed)
HISTORY AND PHYSICAL     CC:  follow up. Requesting Provider:  Shon Baton, MD  HPI: This is a 79 y.o. male here for follow up for carotid artery stenosis.  Pt is s/p right CEA by Dr. Donnetta Hutching on 09/21/13 for asymptomatic stenosis.  Since last office visit he has not been diagnosed with a TIA or CVA.  He also denies any strokelike symptoms including slurring speech, changes in vision, or one-sided weakness.  He is on Eliquis due to hypercoagulability as well as PE sustained after a total knee replacement.  He is also on a statin daily.   Pt was recently seen by cardiology and was asymptomatic from coronary calcifications.  He is not on an aspirin as he is now on Eliquis.  He also has an ascending aortic aneurysm that increased to 4.6cm from 4.0 and plan was to get CTA of chest.    The pt is on a statin for cholesterol management.  The pt is not on a daily aspirin.   Other AC:  Eliquis The pt is on ARB for hypertension.   The pt is not diabetic.   Tobacco hx:  cigars   Past Medical History:  Diagnosis Date  . Anxiety    reports scotch helps that   . Aortic stenosis, mild 08/18/2018   Mean gradient 14 mmHg 04/2018  . Arthritis    " it's everywhere"  . Blood transfusion without reported diagnosis   . Cataract    having cataract surgery July 2016  . Chronic kidney disease    hx kidney stones  . Colon polyps   . Complication of anesthesia    after surgery 07-2013 unable to sleep for 3 days  . Coronary artery calcification seen on CAT scan 08/18/2018  . GERD (gastroesophageal reflux disease)   . History of kidney stones   . Hyperlipidemia   . Hypertension   . Prostate hypertrophy   . Pulmonary embolism ([place]) 12/2014   post TJR  . Seasonal allergies   . 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 Sleep apnea    had surgery in 2000 for sleep apnea, no CPAP needed  . Thoracic aortic aneurysm ([place]) 05/04/2019     4.6 cm 04/2019    Past Surgical History:  Procedure Laterality Date  . CAROTID ENDARTERECTOMY Right Oct 01, 2013   CE  . COLONOSCOPY    . ENDARTERECTOMY Right 10/01/2013   Procedure: ENDARTERECTOMY CAROTID-RIGHT;  Surgeon: Rosetta Posner, MD;  Location: [place];  Service: Vascular;  Laterality: Right;  . ESOPHAGOGASTRODUODENOSCOPY ENDOSCOPY  10-12-2013  . EXCISION ORAL LESION WITH CO2 LASER N/A 09/08/2015   Procedure: EXCISION ORAL LESION WITH CO2 LASER;  Surgeon: Jerrell Belfast, MD;  Location: [place];  Service: ENT;  Laterality: N/A;  . EYE SURGERY Bilateral    cataracts, W/IOL  . JOINT REPLACEMENT Right September 02, 2014   Knee  . JOINT REPLACEMENT Left 2012   Knee  . PATCH ANGIOPLASTY Right 10/01/2013   Procedure: PATCH ANGIOPLASTY USING HEMASHIELD FINESSE PATCH;  Surgeon: Rosetta Posner, MD;  Location: [place];  Service: Vascular;  Laterality: Right;  . removal of melonomia Left    shoulder  . REPLACEMENT TOTAL KNEE Left   . REVERSE SHOULDER ARTHROPLASTY Right 10/11/2016   Procedure: REVERSE RIGHT SHOULDER ARTHROPLASTY;  Surgeon: Netta Cedars, MD;  Location: [facility];  Service: Orthopedics;  Laterality: Right;  .  RHINOPLASTY     1999  . RHINOPLASTY    . SKIN CANCER EXCISION     squamous cell cancer under tongue  . SKIN SURGERY     squamous cell carcinoma removed in various places,melanoma removed from L shoulder  . sleep apnea surgery  2000  . STERIOD INJECTION Left 10/11/2016   Procedure: STEROID INJECTION LEFT SHOULDER;  Surgeon: Netta Cedars, MD;  Location: [place];  Service: Orthopedics;  Laterality: Left;  . TOTAL HIP ARTHROPLASTY Left 10/01/2017   Procedure: LEFT TOTAL HIP ARTHROPLASTY ANTERIOR APPROACH;  Surgeon: Gaynelle Arabian, MD;  [facility];  Service: Orthopedics;  Laterality: Left;  . TOTAL KNEE ARTHROPLASTY Right 09/02/2014   Procedure: RIGHT TOTAL KNEE ARTHROPLASTY;  Surgeon: Netta Cedars, MD;  Location: [place];  Service: Orthopedics;  Laterality: Right;    Allergies   Allergen Reactions  . Cefaclor Other (See Comments)     skin split  . Penicillins Rash    Has patient had a PCN reaction causing immediate rash, facial/tongue/throat swelling, SOB or lightheadedness with hypotension:unsure Has patient had a PCN reaction causing severe rash involving mucus membranes or skin necrosis:unsure Has patient had a PCN reaction that required hospitalization:No Has patient had a PCN reaction occurring within the last 10 years:No--childhood allergy If all of the above answers are "NO", then may proceed with Cephalosporin use.     Current Outpatient Medications  Medication Sig Dispense Refill  . apixaban (ELIQUIS) 5 MG TABS tablet Take 5 mg by mouth 2 (two) times daily.     . B Complex-C (B-COMPLEX WITH VITAMIN C) tablet Take 1 tablet by mouth daily.    . cholecalciferol (VITAMIN D) 1000 units tablet Take 1,000 Units by mouth daily.    Marland Kitchen esomeprazole (NEXIUM) 40 MG capsule Take 40 mg by mouth daily as needed (acid reflux).     . fenofibrate (TRICOR) 145 MG tablet Take 145 mg by mouth daily before breakfast.     . fluticasone (FLONASE) 50 MCG/ACT nasal spray Place 2 sprays into both nostrils daily as needed for allergies or rhinitis.    . Fluticasone-Salmeterol (ADVAIR) 100-50 MCG/DOSE AEPB Inhale 1 puff into the lungs 2 (two) times daily as needed (for respiratory issues.).    Marland Kitchen losartan-hydrochlorothiazide (HYZAAR) 100-12.5 MG tablet Take 1 tablet by mouth daily.    . Magnesium 100 MG TABS Take 1 tablet by mouth daily.    . NON FORMULARY CBD Oil 1000mg  daily    . Omega-3 Fatty Acids (FISH OIL) 1000 MG CAPS Take 1,000 mg by mouth daily.    . probenecid (BENEMID) 500 MG tablet Take 500 mg by mouth daily before breakfast.     . rosuvastatin (CRESTOR) 10 MG tablet Take 10 mg by mouth daily.    . sildenafil (VIAGRA) 25 MG tablet Take 25 mg by mouth daily as needed for erectile dysfunction.    . tamsulosin (FLOMAX) 0.4 MG CAPS capsule Take 0.4 mg by mouth daily.       No current facility-administered medications for this visit.    Family History  Problem Relation Age of Onset  . Diverticulosis Mother   . Skin cancer Mother   . Varicose Veins Mother   . Heart attack Mother   . Hyperlipidemia Father   . Heart disease Father   . Heart attack Father   . Colon cancer Neg Hx   . Esophageal cancer Neg Hx   . Rectal cancer Neg Hx   . Stomach cancer Neg Hx  Social History   Socioeconomic History  . Marital status: Divorced    Spouse name: Not on file  . Number of children: 2  . Years of education: Not on file  . Highest education level: Not on file  Occupational History  . Occupation: Theme park manager: [employer]  Tobacco Use  . Smoking status: Light Tobacco Smoker    Years: 50.00    Types: Cigars  . Smokeless tobacco: Never Used  . Tobacco comment: pt states he smokes a couple of cigars per day. Never smoke cigarettes  Substance and Sexual Activity  . Alcohol use: Yes    Alcohol/week: 21.0 standard drinks    Types: 7 Glasses of wine, 14 Shots of liquor per week  . Drug use: No  . Sexual activity: Not on file  Other Topics Concern  . Not on file  Social History Narrative  . Not on file   Social Determinants of Health   Financial Resource Strain:   . Difficulty of Paying Living Expenses: Not on file  Food Insecurity:   . Worried About Charity fundraiser in the Last Year: Not on file  . Ran Out of Food in the Last Year: Not on file  Transportation Needs:   . Lack of Transportation (Medical): Not on file  . Lack of Transportation (Non-Medical): Not on file  Physical Activity:   . Days of Exercise per Week: Not on file  . Minutes of Exercise per Session: Not on file  Stress:   . Feeling of Stress : Not on file  Social Connections:   . Frequency of Communication with Friends and Family: Not on file  . Frequency of Social Gatherings with Friends and Family: Not on file  . Attends Religious  Services: Not on file  . Active Member of Clubs or Organizations: Not on file  . Attends Archivist Meetings: Not on file  . Marital Status: Not on file  Intimate Partner Violence:   . Fear of Current or Ex-Partner: Not on file  . Emotionally Abused: Not on file  . Physically Abused: Not on file  . Sexually Abused: Not on file     REVIEW OF SYSTEMS:   [X]  denotes positive finding, [ ]  denotes negative finding Cardiac  Comments:  Chest pain or chest pressure:    Shortness of breath upon exertion:    Short of breath when lying flat:    Irregular heart rhythm:        Vascular    Pain in calf, thigh, or hip brought on by ambulation:    Pain in feet at night that wakes you up from your sleep:     Blood clot in your veins:    Leg swelling:         Pulmonary    Oxygen at home:    Productive cough:     Wheezing:         Neurologic    Sudden weakness in arms or legs:     Sudden numbness in arms or legs:     Sudden onset of difficulty speaking or slurred speech:    Temporary loss of vision in one eye:     Problems with dizziness:         Gastrointestinal    Blood in stool:     Vomited blood:         Genitourinary    Burning when urinating:     Blood in urine:  Psychiatric    Major depression:         Hematologic    Bleeding problems:    Problems with blood clotting too easily:        Skin    Rashes or ulcers:        Constitutional    Fever or chills:      PHYSICAL EXAMINATION:    General:  WDWN in NAD; vital signs documented above Gait: Not observed HENT: WNL, normocephalic Pulmonary: normal non-labored breathing , without Rales, rhonchi,  wheezing Cardiac: regular HR Abdomen: soft, NT, no masses Skin: without rashes Vascular Exam/Pulses:  Right Left  Radial 2+ (normal) 2+ (normal)  DP 2+ (normal) 2+ (normal)   Extremities: without ischemic changes, without Gangrene , without cellulitis; without open wounds;  Musculoskeletal: no  muscle wasting or atrophy  Neurologic: A&O X 3 Psychiatric:  The pt has Normal affect.   Non-Invasive Vascular Imaging:   Carotid Duplex on 07/19/19: Right:  1-39% ICA stenosis Left:  1-39% ICA stenosis   Previous Carotid duplex on 07/07/2018: Right: non hemodynamically significant plaque <50% in CCA Left:   40-59% ICA stenosis Vertebrals: Bilateral vertebral arteries demonstrate antegrade flow.  Subclavians: Normal flow hemodynamics were seen in bilateral subclavian  arteries.  ABI's 12/25/2018- 1.1 bilaterally   ASSESSMENT/PLAN:: 79 y.o. male here for follow up carotid artery stenosis.  He is s/p  right CEA by Dr. Donnetta Hutching on 09/21/13.  Stable carotid duplex demonstrating right ICA stenosis 1 to 39% and left ICA stenosis 1 to 39% Continue statin daily Continue Eliquis for hypercoagulability as well as history of PE Recheck carotid duplex in 1 year   Dagoberto Ligas, PA-C [facility] [phone]  Clinic MD:  Trula Slade

## 2019-07-19 ENCOUNTER — Ambulatory Visit (HOSPITAL_COMMUNITY)
Admission: RE | Admit: 2019-07-19 | Discharge: 2019-07-19 | Disposition: A | Discharge status: Home / Self Care | Payer: Medicare Other | Source: Ambulatory Visit | Attending: Surgery | Admitting: Surgery

## 2019-07-19 ENCOUNTER — Other Ambulatory Visit: Payer: Self-pay

## 2019-07-19 ENCOUNTER — Ambulatory Visit (INDEPENDENT_AMBULATORY_CARE_PROVIDER_SITE_OTHER): Payer: Medicare Other | Admitting: Physician Assistant

## 2019-07-19 VITALS — BP 95/58 | HR 90 | Temp 97.3°F | Resp 16 | Ht 70.0 in | Wt 185.0 lb

## 2019-07-19 DIAGNOSIS — I6523 Occlusion and stenosis of bilateral carotid arteries: Secondary | ICD-10-CM | POA: Insufficient documentation

## 2019-07-19 DIAGNOSIS — Z7901 Long term (current) use of anticoagulants: Secondary | ICD-10-CM

## 2019-07-19 DIAGNOSIS — I6522 Occlusion and stenosis of left carotid artery: Secondary | ICD-10-CM

## 2019-07-20 ENCOUNTER — Other Ambulatory Visit: Payer: Self-pay | Admitting: *Deleted

## 2019-07-20 DIAGNOSIS — I6523 Occlusion and stenosis of bilateral carotid arteries: Secondary | ICD-10-CM

## 2019-08-04 DIAGNOSIS — M25512 Pain in left shoulder: Secondary | ICD-10-CM | POA: Diagnosis not present

## 2019-08-17 DIAGNOSIS — L281 Prurigo nodularis: Secondary | ICD-10-CM | POA: Diagnosis not present

## 2019-08-17 DIAGNOSIS — D485 Neoplasm of uncertain behavior of skin: Secondary | ICD-10-CM | POA: Diagnosis not present

## 2019-08-17 DIAGNOSIS — L820 Inflamed seborrheic keratosis: Secondary | ICD-10-CM | POA: Diagnosis not present

## 2019-08-17 DIAGNOSIS — L570 Actinic keratosis: Secondary | ICD-10-CM | POA: Diagnosis not present

## 2019-08-30 DIAGNOSIS — C069 Malignant neoplasm of mouth, unspecified: Secondary | ICD-10-CM | POA: Diagnosis not present

## 2019-08-30 DIAGNOSIS — L570 Actinic keratosis: Secondary | ICD-10-CM | POA: Diagnosis not present

## 2019-08-30 DIAGNOSIS — J302 Other seasonal allergic rhinitis: Secondary | ICD-10-CM | POA: Diagnosis not present

## 2019-08-30 DIAGNOSIS — H6983 Other specified disorders of Eustachian tube, bilateral: Secondary | ICD-10-CM | POA: Diagnosis not present

## 2019-09-14 DIAGNOSIS — M25552 Pain in left hip: Secondary | ICD-10-CM | POA: Diagnosis not present

## 2019-09-14 DIAGNOSIS — M25512 Pain in left shoulder: Secondary | ICD-10-CM | POA: Diagnosis not present

## 2019-10-05 DIAGNOSIS — D1801 Hemangioma of skin and subcutaneous tissue: Secondary | ICD-10-CM | POA: Diagnosis not present

## 2019-10-05 DIAGNOSIS — D229 Melanocytic nevi, unspecified: Secondary | ICD-10-CM | POA: Diagnosis not present

## 2019-10-05 DIAGNOSIS — Z85828 Personal history of other malignant neoplasm of skin: Secondary | ICD-10-CM | POA: Diagnosis not present

## 2019-10-05 DIAGNOSIS — L819 Disorder of pigmentation, unspecified: Secondary | ICD-10-CM | POA: Diagnosis not present

## 2019-10-05 DIAGNOSIS — L570 Actinic keratosis: Secondary | ICD-10-CM | POA: Diagnosis not present

## 2019-10-05 DIAGNOSIS — I8393 Asymptomatic varicose veins of bilateral lower extremities: Secondary | ICD-10-CM | POA: Diagnosis not present

## 2019-10-05 DIAGNOSIS — L814 Other melanin hyperpigmentation: Secondary | ICD-10-CM | POA: Diagnosis not present

## 2019-10-05 DIAGNOSIS — L910 Hypertrophic scar: Secondary | ICD-10-CM | POA: Diagnosis not present

## 2019-10-05 DIAGNOSIS — L821 Other seborrheic keratosis: Secondary | ICD-10-CM | POA: Diagnosis not present

## 2019-10-05 DIAGNOSIS — L905 Scar conditions and fibrosis of skin: Secondary | ICD-10-CM | POA: Diagnosis not present

## 2019-10-13 DIAGNOSIS — N401 Enlarged prostate with lower urinary tract symptoms: Secondary | ICD-10-CM | POA: Diagnosis not present

## 2019-10-28 DIAGNOSIS — M19012 Primary osteoarthritis, left shoulder: Secondary | ICD-10-CM | POA: Diagnosis not present

## 2019-10-28 DIAGNOSIS — M7062 Trochanteric bursitis, left hip: Secondary | ICD-10-CM | POA: Diagnosis not present

## 2019-11-04 DIAGNOSIS — D485 Neoplasm of uncertain behavior of skin: Secondary | ICD-10-CM | POA: Diagnosis not present

## 2019-11-04 DIAGNOSIS — C44722 Squamous cell carcinoma of skin of right lower limb, including hip: Secondary | ICD-10-CM | POA: Diagnosis not present

## 2019-11-19 DIAGNOSIS — C44722 Squamous cell carcinoma of skin of right lower limb, including hip: Secondary | ICD-10-CM | POA: Diagnosis not present

## 2019-11-19 DIAGNOSIS — D485 Neoplasm of uncertain behavior of skin: Secondary | ICD-10-CM | POA: Diagnosis not present

## 2019-11-23 DIAGNOSIS — M25551 Pain in right hip: Secondary | ICD-10-CM | POA: Diagnosis not present

## 2019-11-23 DIAGNOSIS — J302 Other seasonal allergic rhinitis: Secondary | ICD-10-CM | POA: Diagnosis not present

## 2019-11-23 DIAGNOSIS — C069 Malignant neoplasm of mouth, unspecified: Secondary | ICD-10-CM | POA: Diagnosis not present

## 2019-11-23 NOTE — H&P (Signed)
Patient's anticipated LOS is less than 2 midnights, meeting these requirements: - Younger than 61 - Lives within 1 hour of care - Has a competent adult at home to recover with post-op recover - NO history of  - Chronic pain requiring opiods  - Diabetes  - Coronary Artery Disease  - Heart failure  - Heart attack  - Stroke  - DVT/VTE  - Cardiac arrhythmia  - Respiratory Failure/COPD  - Renal failure  - Anemia  - Advanced Liver disease       [name] is an 79 y.o. male.    Chief Complaint: left shoulder pain  HPI: Pt is a 79 y.o. male complaining of left shoulder pain for multiple years. Pain had continually increased since the beginning. X-rays in the clinic show end-stage arthritic changes of the left shoulder. Pt has tried various conservative treatments which have failed to alleviate their symptoms, including injections and therapy. Various options are discussed with the patient. Risks, benefits and expectations were discussed with the patient. Patient understand the risks, benefits and expectations and wishes to proceed with surgery.   PCP:  Shon Baton, MD  D/C Plans: Home  PMH: Past Medical History:  Diagnosis Date  . Anxiety    reports scotch helps that   . Aortic stenosis, mild 08/18/2018   Mean gradient 14 mmHg 04/2018  . Arthritis    " it's everywhere"  . Blood transfusion without reported diagnosis   . Cataract    having cataract surgery July 2016  . Chronic kidney disease    hx kidney stones  . Colon polyps   . Complication of anesthesia    after surgery 07-2013 unable to sleep for 3 days  . Coronary artery calcification seen on CAT scan 08/18/2018  . GERD (gastroesophageal reflux disease)   . History of kidney stones   . Hyperlipidemia   . Hypertension   . Prostate hypertrophy   . Pulmonary embolism ([place]) 12/2014   post TJR  . Seasonal allergies   . Skin cancer    squamous cell, 1 melanoma left shoulder, one episode- under tongue, has had a  total of 17-18 episodes   . Sleep apnea    had surgery in 2000 for sleep apnea, no CPAP needed  . Thoracic aortic aneurysm ([place]) 05/04/2019   4.6 cm 04/2019    PSH: Past Surgical History:  Procedure Laterality Date  . CAROTID ENDARTERECTOMY Right Oct 01, 2013   CE  . COLONOSCOPY    . ENDARTERECTOMY Right 10/01/2013   Procedure: ENDARTERECTOMY CAROTID-RIGHT;  Surgeon: Rosetta Posner, MD;  Location: [facility];  Service: Vascular;  Laterality: Right;  . ESOPHAGOGASTRODUODENOSCOPY ENDOSCOPY  10-12-2013  . EXCISION ORAL LESION WITH CO2 LASER N/A 09/08/2015   Procedure: EXCISION ORAL LESION WITH CO2 LASER;  Surgeon: Jerrell Belfast, MD;  Location: [facility];  Service: ENT;  Laterality: N/A;  . EYE SURGERY Bilateral    cataracts, W/IOL  . JOINT REPLACEMENT Right September 02, 2014   Knee  . JOINT REPLACEMENT Left 2012   Knee  . PATCH ANGIOPLASTY Right 10/01/2013   Procedure: PATCH ANGIOPLASTY USING HEMASHIELD FINESSE PATCH;  Surgeon: Rosetta Posner, MD;  Location: [facility];  Service: Vascular;  Laterality: Right;  . removal of melonomia Left    shoulder  . REPLACEMENT TOTAL KNEE Left   . REVERSE SHOULDER ARTHROPLASTY Right 10/11/2016   Procedure: REVERSE RIGHT SHOULDER ARTHROPLASTY;  Surgeon: Netta Cedars, MD;  Location: [facility];  Service: Orthopedics;  Laterality: Right;  .  RHINOPLASTY     1999  . RHINOPLASTY    . SKIN CANCER EXCISION     squamous cell cancer under tongue  . SKIN SURGERY     squamous cell carcinoma removed in various places,melanoma removed from L shoulder  . sleep apnea surgery  2000  . STERIOD INJECTION Left 10/11/2016   Procedure: STEROID INJECTION LEFT SHOULDER;  Surgeon: Netta Cedars, MD;  Location: [facility];  Service: Orthopedics;  Laterality: Left;  . TOTAL HIP ARTHROPLASTY Left 10/01/2017   Procedure: LEFT TOTAL HIP ARTHROPLASTY ANTERIOR APPROACH;  Surgeon: Gaynelle Arabian, MD;  Location: [facility];  Service: Orthopedics;  Laterality: Left;  . TOTAL KNEE ARTHROPLASTY Right 09/02/2014    Procedure: RIGHT TOTAL KNEE ARTHROPLASTY;  Surgeon: Netta Cedars, MD;  Location: [facility];  Service: Orthopedics;  Laterality: Right;    Social History:  reports that he has been smoking cigars. He has smoked for the past 50.00 years. He has never used smokeless tobacco. He reports current alcohol use of about 21.0 standard drinks of alcohol per week. He reports that he does not use drugs.  Allergies:  Allergies  Allergen Reactions  . Cefaclor Other (See Comments)     skin split  . Penicillins Rash    Has patient had a PCN reaction causing immediate rash, facial/tongue/throat swelling, SOB or lightheadedness with hypotension:unsure Has patient had a PCN reaction causing severe rash involving mucus membranes or skin necrosis:unsure Has patient had a PCN reaction that required hospitalization:No Has patient had a PCN reaction occurring within the last 10 years:No--childhood allergy If all of the above answers are "NO", then may proceed with Cephalosporin use.     Medications: No current facility-administered medications for this encounter.   Current Outpatient Medications  Medication Sig Dispense Refill  . apixaban (ELIQUIS) 5 MG TABS tablet Take 5 mg by mouth 2 (two) times daily.     . B Complex-C (B-COMPLEX WITH VITAMIN C) tablet Take 1 tablet by mouth daily.    . cholecalciferol (VITAMIN D) 1000 units tablet Take 1,000 Units by mouth daily.    Marland Kitchen esomeprazole (NEXIUM) 40 MG capsule Take 40 mg by mouth daily as needed (acid reflux).     . fenofibrate (TRICOR) 145 MG tablet Take 145 mg by mouth daily before breakfast.     . finasteride (PROSCAR) 5 MG tablet Take 5 mg by mouth daily.    . fluticasone (FLONASE) 50 MCG/ACT nasal spray Place 2 sprays into both nostrils daily as needed for allergies or rhinitis.    . Fluticasone-Salmeterol (ADVAIR) 100-50 MCG/DOSE AEPB Inhale 1 puff into the lungs 2 (two) times daily as needed (for respiratory issues.).    Marland Kitchen losartan-hydrochlorothiazide  (HYZAAR) 100-12.5 MG tablet Take 1 tablet by mouth daily.    . Magnesium 100 MG TABS Take 100 mg by mouth daily.     . NON FORMULARY Take 1,000 mg by mouth daily. CBD Oil    . Omega-3 Fatty Acids (FISH OIL) 1000 MG CAPS Take 1,000 mg by mouth daily.    . probenecid (BENEMID) 500 MG tablet Take 500 mg by mouth daily before breakfast.     . rosuvastatin (CRESTOR) 10 MG tablet Take 10 mg by mouth daily.    . sildenafil (VIAGRA) 25 MG tablet Take 25 mg by mouth daily as needed for erectile dysfunction.    . tamsulosin (FLOMAX) 0.4 MG CAPS capsule Take 0.4 mg by mouth daily.       No results found for this or any  previous visit (from the past 48 hour(s)). No results found.  ROS: Pain with rom of the left upper extremity  Physical Exam: Alert and oriented 79 y.o. male in no acute distress Cranial nerves 2-12 intact Cervical spine: full rom with no tenderness, nv intact distally Chest: active breath sounds bilaterally, no wheeze rhonchi or rales Heart: regular rate and rhythm, no murmur Abd: non tender non distended with active bowel sounds Hip is stable with rom  Left shoulder with painful and weak rom nv intact distally No rashes or edema distally  Assessment/Plan Assessment: left shoulder cuff arthropathy  Plan:  Patient will undergo a left reverse total shoulder by Dr. [name] at [facility] Risks benefits and expectations were discussed with the patient. Patient understand risks, benefits and expectations and wishes to proceed. Preoperative templating of the joint replacement has been completed, documented, and submitted to the Operating Room personnel in order to optimize intra-operative equipment management.   Merla Riches PA-C, MPAS [facility] is now [facility] [address] [place], [place], [place] [address] Phone: [phone] [email or web address] Facebook  [employer]

## 2019-11-24 NOTE — Progress Notes (Deleted)
Pt's PST interview was done over the phone.Lab appointment has been rescheduled for 2:00 pm on 11/26/19.Pt. refused to schedule his COVID test appointment for 7/13,he clams that he needs to work that day.RN will notified Dr. Harrietta Guardian office about it.

## 2019-11-24 NOTE — Patient Instructions (Addendum)
DUE TO COVID-19 ONLY ONE VISITOR IS ALLOWED TO COME WITH YOU AND STAY IN THE WAITING ROOM ONLY DURING PRE OP AND PROCEDURE DAY OF SURGERY. THE 1 VISITOR MAY VISIT WITH YOU AFTER SURGERY IN YOUR PRIVATE ROOM DURING VISITING HOURS ONLY!  YOU NEED TO HAVE A COVID 19 TEST ON: 11/30/19 @  8:45 am , THIS TEST MUST BE DONE BEFORE SURGERY, COME  [address], [place] , [address].  ([place]) ONCE YOUR COVID TEST IS COMPLETED, PLEASE BEGIN THE QUARANTINE INSTRUCTIONS AS OUTLINED IN YOUR HANDOUT.                [name]   Your procedure is scheduled on: 12/03/19   Report to [facility] Main  Entrance   Report to admitting at: 10:00 AM     Call this number if you have problems the morning of surgery [phone]    Remember:   NO SOLID FOOD AFTER MIDNIGHT THE NIGHT PRIOR TO SURGERY. NOTHING BY MOUTH EXCEPT CLEAR LIQUIDS UNTIL: 9:30 am . PLEASE FINISH ENSURE DRINK PER SURGEON ORDER  WHICH NEEDS TO BE COMPLETED AT : 9:30 am.   CLEAR LIQUID DIET   Foods Allowed                                                                     Foods Excluded  Coffee and tea, regular and decaf                             liquids that you cannot  Plain Jell-O any favor except red or purple                                           see through such as: Fruit ices (not with fruit pulp)                                     milk, soups, orange juice  Iced Popsicles                                    All solid food Carbonated beverages, regular and diet                                    Cranberry, grape and apple juices Sports drinks like Gatorade Lightly seasoned clear broth or consume(fat free) Sugar, honey syrup  Sample Menu Breakfast                                Lunch                                     Supper Cranberry juice  Beef broth                            Chicken broth Jell-O                                     Grape juice                            Apple juice Coffee or tea                        Jell-O                                      Popsicle                                                Coffee or tea                        Coffee or tea  _____________________________________________________________________  BRUSH YOUR TEETH MORNING OF SURGERY AND RINSE YOUR MOUTH OUT, NO CHEWING GUM CANDY OR MINTS.     Take these medicines the morning of surgery with A SIP OF WATER: esomeprazole,tamsulosin.Use flonase as usual.                                You may not have any metal on your body including hair pins and              piercings  Do not wear jewelry, lotions, powders or perfumes, deodorant             Men may shave face and neck.   Do not bring valuables to the hospital. [place].  Contacts, dentures or bridgework may not be worn into surgery.  Leave suitcase in the car. After surgery it may be brought to your room.     Patients discharged the day of surgery will not be allowed to drive home. IF YOU ARE HAVING SURGERY AND GOING HOME THE SAME DAY, YOU MUST HAVE AN ADULT TO DRIVE YOU HOME AND BE WITH YOU FOR 24 HOURS. YOU MAY GO HOME BY TAXI OR UBER OR ORTHERWISE, BUT AN ADULT MUST ACCOMPANY YOU HOME AND STAY WITH YOU FOR 24 HOURS.  Name and phone number of your driver:  Special Instructions: N/A              Please read over the following fact sheets you were given: _____________________________________________________________________  [facility]- Preparing for Total Shoulder Arthroplasty    Before surgery, you can play an important role. Because skin is not sterile, your skin needs to be as free of germs as possible. You can reduce the number of germs on your skin by using the following products. . Benzoyl Peroxide Gel o Reduces the number of germs present on the skin o Applied twice a day to shoulder area starting two  days before surgery     ==================================================================  Please follow these instructions carefully:  BENZOYL PEROXIDE 5% GEL  Please do not use if you have an allergy to benzoyl peroxide.   If your skin becomes reddened/irritated stop using the benzoyl peroxide.  Starting two days before surgery, apply as follows: 1. Apply benzoyl peroxide in the morning and at night. Apply after taking a shower. If you are not taking a shower clean entire shoulder front, back, and side along with the armpit with a clean wet washcloth.  2. Place a quarter-sized dollop on your shoulder and rub in thoroughly, making sure to cover the front, back, and side of your shoulder, along with the armpit.   2 days before ____ AM   ____ PM              1 day before ____ AM   ____ PM                         3. Do this twice a day for two days.  (Last application is the night before surgery, AFTER using the CHG soap as described below).  4. Do NOT apply benzoyl peroxide gel on the day of surgery.            [facility] - Preparing for Surgery Before surgery, you can play an important role.  Because skin is not sterile, your skin needs to be as free of germs as possible.  You can reduce the number of germs on your skin by washing with CHG (chlorahexidine gluconate) soap before surgery.  CHG is an antiseptic cleaner which kills germs and bonds with the skin to continue killing germs even after washing. Please DO NOT use if you have an allergy to CHG or antibacterial soaps.  If your skin becomes reddened/irritated stop using the CHG and inform your nurse when you arrive at Short Stay. Do not shave (including legs and underarms) for at least 48 hours prior to the first CHG shower.  You may shave your face/neck. Please follow these instructions carefully:  1.  Shower with CHG Soap the night before surgery and the  morning of Surgery.  2.  If you choose to wash your hair, wash your hair first as usual with your   normal  shampoo.  3.  After you shampoo, rinse your hair and body thoroughly to remove the  shampoo.                           4.  Use CHG as you would any other liquid soap.  You can apply chg directly  to the skin and wash                       Gently with a scrungie or clean washcloth.  5.  Apply the CHG Soap to your body ONLY FROM THE NECK DOWN.   Do not use on face/ open                           Wound or open sores. Avoid contact with eyes, ears mouth and genitals (private parts).                       Wash face,  Genitals (private parts) with your normal soap.  6.  Wash thoroughly, paying special attention to the area where your surgery  will be performed.  7.  Thoroughly rinse your body with warm water from the neck down.  8.  DO NOT shower/wash with your normal soap after using and rinsing off  the CHG Soap.                9.  Pat yourself dry with a clean towel.            10.  Wear clean pajamas.            11.  Place clean sheets on your bed the night of your first shower and do not  sleep with pets. Day of Surgery : Do not apply any lotions/deodorants the morning of surgery.  Please wear clean clothes to the hospital/surgery center.  FAILURE TO FOLLOW THESE INSTRUCTIONS MAY RESULT IN THE CANCELLATION OF YOUR SURGERY PATIENT SIGNATURE_________________________________  NURSE SIGNATURE__________________________________  ________________________________________________________________________   [name]  An incentive spirometer is a tool that can help keep your lungs clear and active. This tool measures how well you are filling your lungs with each breath. Taking long deep breaths may help reverse or decrease the chance of developing breathing (pulmonary) problems (especially infection) following:  A long period of time when you are unable to move or be active. BEFORE THE PROCEDURE   If the spirometer includes an indicator to show your best effort, your  nurse or respiratory therapist will set it to a desired goal.  If possible, sit up straight or lean slightly forward. Try not to slouch.  Hold the incentive spirometer in an upright position. INSTRUCTIONS FOR USE  1. Sit on the edge of your bed if possible, or sit up as far as you can in bed or on a chair. 2. Hold the incentive spirometer in an upright position. 3. Breathe out normally. 4. Place the mouthpiece in your mouth and seal your lips tightly around it. 5. Breathe in slowly and as deeply as possible, raising the piston or the ball toward the top of the column. 6. Hold your breath for 3-5 seconds or for as long as possible. Allow the piston or ball to fall to the bottom of the column. 7. Remove the mouthpiece from your mouth and breathe out normally. 8. Rest for a few seconds and repeat Steps 1 through 7 at least 10 times every 1-2 hours when you are awake. Take your time and take a few normal breaths between deep breaths. 9. The spirometer may include an indicator to show your best effort. Use the indicator as a goal to work toward during each repetition. 10. After each set of 10 deep breaths, practice coughing to be sure your lungs are clear. If you have an incision (the cut made at the time of surgery), support your incision when coughing by placing a pillow or rolled up towels firmly against it. Once you are able to get out of bed, walk around indoors and cough well. You may stop using the incentive spirometer when instructed by your caregiver.  RISKS AND COMPLICATIONS  Take your time so you do not get dizzy or light-headed.  If you are in pain, you may need to take or ask for pain medication before doing incentive spirometry. It is harder to take a deep breath if you are having pain. AFTER USE  Rest and breathe slowly and easily.  It can be helpful to keep track of a log of your  progress. Your caregiver can provide you with a simple table to help with this. If you are using the  spirometer at home, follow these instructions: [place] IF:   You are having difficultly using the spirometer.  You have trouble using the spirometer as often as instructed.  Your pain medication is not giving enough relief while using the spirometer.  You develop fever of 100.5 F (38.1 C) or higher. SEEK IMMEDIATE MEDICAL CARE IF:   You cough up bloody sputum that had not been present before.  You develop fever of 102 F (38.9 C) or greater.  You develop worsening pain at or near the incision site. MAKE SURE YOU:   Understand these instructions.  Will watch your condition.  Will get help right away if you are not doing well or get worse. Document Released: 09/16/2006 Document Revised: 07/29/2011 Document Reviewed: 11/17/2006 [facility] Patient Information 2014 [place], [place].   ________________________________________________________________________

## 2019-11-25 ENCOUNTER — Encounter (HOSPITAL_COMMUNITY)
Admission: RE | Admit: 2019-11-25 | Discharge: 2019-11-25 | Disposition: A | Discharge status: Home / Self Care | Payer: Medicare Other | Source: Ambulatory Visit | Attending: Orthopedic Surgery | Admitting: Orthopedic Surgery

## 2019-11-25 ENCOUNTER — Encounter (HOSPITAL_COMMUNITY): Payer: Self-pay

## 2019-11-25 ENCOUNTER — Other Ambulatory Visit: Payer: Self-pay

## 2019-11-25 DIAGNOSIS — Z01818 Encounter for other preprocedural examination: Secondary | ICD-10-CM | POA: Diagnosis not present

## 2019-11-25 HISTORY — DX: Unspecified chronic bronchitis: J42

## 2019-11-25 LAB — BASIC METABOLIC PANEL
Anion gap: 13 (ref 5–15)
BUN: 24 mg/dL — ABNORMAL HIGH (ref 8–23)
CO2: 26 mmol/L (ref 22–32)
Calcium: 9.2 mg/dL (ref 8.9–10.3)
Chloride: 99 mmol/L (ref 98–111)
Creatinine, Ser: 1.32 mg/dL — ABNORMAL HIGH (ref 0.61–1.24)
GFR calc Af Amer: 60 mL/min — ABNORMAL LOW (ref >60)
GFR calc non Af Amer: 52 mL/min — ABNORMAL LOW (ref >60)
Glucose, Bld: 149 mg/dL — ABNORMAL HIGH (ref 70–99)
Potassium: 3.7 mmol/L (ref 3.5–5.1)
Sodium: 138 mmol/L (ref 135–145)

## 2019-11-25 LAB — CBC
HCT: 46.5 % (ref 39.0–52.0)
Hemoglobin: 15.4 g/dL (ref 13.0–17.0)
MCH: 33.6 pg (ref 26.0–34.0)
MCHC: 33.1 g/dL (ref 30.0–36.0)
MCV: 101.5 fL — ABNORMAL HIGH (ref 80.0–100.0)
Platelets: 153 10*3/uL (ref 150–400)
RBC: 4.58 MIL/uL (ref 4.22–5.81)
RDW: 15.1 % (ref 11.5–15.5)
WBC: 11.3 10*3/uL — ABNORMAL HIGH (ref 4.0–10.5)
nRBC: 0 % (ref 0.0–0.2)

## 2019-11-25 LAB — SURGICAL PCR SCREEN
MRSA, PCR: NEGATIVE
Staphylococcus aureus: POSITIVE — AB

## 2019-11-25 NOTE — Progress Notes (Signed)
COVID Vaccine Completed:yes  Date COVID Vaccine completed: 06/20/19 COVID vaccine manufacturer: *[place]   PCP -  Cardiologist -  Dr. Skeet Latch. LOV: 05/04/19  Chest x-ray - Shon Baton EKG - 05/04/19. EPIC Stress Test -  ECHO - 04/29/19 EPIC Cardiac Cath -   Sleep Study -  CPAP -   Fasting Blood Sugar -  Checks Blood Sugar _____ times a day  Blood Thinner Instructions: No. Pt. Was advised to call MD. For instructions on Eliquis Aspirin Instructions: Last Dose:  Anesthesia review: Hx: Aorta Aneurism,carotic stenosis,PE,HTN  Patient denies shortness of breath, fever, cough and chest pain at PAT appointment   Patient verbalized understanding of instructions that were given to them at the PAT appointment. Patient was also instructed that they will need to review over the PAT instructions again at home before surgery.

## 2019-11-25 NOTE — Progress Notes (Signed)
Lab results: [facility] positive

## 2019-11-26 DIAGNOSIS — C44722 Squamous cell carcinoma of skin of right lower limb, including hip: Secondary | ICD-10-CM | POA: Diagnosis not present

## 2019-11-26 DIAGNOSIS — S81801D Unspecified open wound, right lower leg, subsequent encounter: Secondary | ICD-10-CM | POA: Diagnosis not present

## 2019-11-29 NOTE — Progress Notes (Addendum)
Anesthesia Chart Review   Case: [id number] Date/Time: 12/03/19 1215   Procedure: REVERSE SHOULDER ARTHROPLASTY (Left Shoulder) - interscalene block   Anesthesia type: General   Pre-op diagnosis: Left shoulder cuff arthropathy   Location: [facility] 06 / [facility]   Surgeons: Netta Cedars, MD      DISCUSSION:79 y.o. light tobacco smoker with h/o HTN, HLD, CKD, GERD, PE 2016 (on Eliquis), thoracic aortic aneurysm, s/p right CEA 09/2013, moderate AS (Echo 04/2019 Mean gradient 18 mmHg, AVA 1.3 cm2), left shoulder cuff arthropathy scheduled for above procedure 12/03/2019 with Dr. Netta Cedars.   No instructions on holding Eliquis.    Last seen by cardiology 04/2019.  Pt stable at this visit, AAA being monitored.  Pt walks for exercise daily.  6 month follow up recommended, scheduled for 12/27/19.    Addendum 11/29/2019:  Clearance from PCP received which states pt is low risk.  Advised to hold Eliquis 2 days prior to procedure.   VS: BP 127/63   Pulse 98   Temp 36.6 C (Oral)   Resp 16   Ht 5\' 10"  (1.778 m)   Wt 85.3 kg   SpO2 100%   BMI 26.98 kg/m   PROVIDERS: Shon Baton, MD is PCP   Skeet Latch, MD is Cardiologist  LABS: Labs reviewed: Acceptable for surgery. (all labs ordered are listed, but only abnormal results are displayed)  Labs Reviewed  SURGICAL PCR SCREEN - Abnormal; Notable for the following components:      Result Value   Staphylococcus aureus POSITIVE (*)    All other components within normal limits  CBC - Abnormal; Notable for the following components:   WBC 11.3 (*)    MCV 101.5 (*)    All other components within normal limits  BASIC METABOLIC PANEL - Abnormal; Notable for the following components:   Glucose, Bld 149 (*)    BUN 24 (*)    Creatinine, Ser 1.32 (*)    GFR calc non Af Amer 52 (*)    GFR calc Af Amer 60 (*)    All other components within normal limits     IMAGES: CT Angio 05/18/2019 IMPRESSION: 1. No acute intrathoracic, abdominal, or  pelvic pathology. No CT evidence of aortic dissection. 2. Mildly enlarged ascending thoracic aorta measuring up to 4.3 cm in diameter. Follow-up as per recommendation of chest CT of 03/25/2019. 3. There is a 3 cm infrarenal abdominal aortic aneurysm Recommend followup by ultrasound in 3 years. This recommendation follows ACR consensus guidelines: White Paper of the ACR Incidental Findings Committee II on Vascular Findings. J Am Coll Radiol 2013; 10:789-794. Aortic aneurysm NOS (ICD10-I71.9) 4. Multi vessel coronary vascular calcification. 5. Stable bilateral adrenal nodules, likely adenomas. 6. Colonic diverticulosis. 7. Aortic Atherosclerosis (ICD10-I70.0).  EKG: 05/04/2019 Rate 83bpm  NSR  CV: Echo 04/29/2019 IMPRESSIONS    1. Left ventricular ejection fraction, by visual estimation, is 55 to  60%. The left ventricle has normal function. There is mildly increased  left ventricular hypertrophy.  2. Left ventricular ejection fraction by 3D volume is is 55 %.  3. The average left ventricular global longitudinal strain is -17.7 %.  4. The left ventricle has no regional wall motion abnormalities.  5. Global right ventricle has normal systolic function.The right  ventricular size is normal. No increase in right ventricular wall  thickness.  6. The aortic valve is abnormal. Moderate calcifications. Aortic valve  regurgitation is trivial. Moderate aortic valve stenosis. Vmax 2.8 m/s, MG  87mmHg, AVA 1.3  cm^2, DI 0.3  7. Moderate mitral annular calcification.  8. The mitral valve is abnormal. Trivial mitral valve regurgitation.  9. The tricuspid valve is normal in structure. Tricuspid valve  regurgitation is not demonstrated.  10. The pulmonic valve was not well visualized. Pulmonic valve  regurgitation is not visualized.  11. Left atrial size was normal.  12. Right atrial size was normal.  13. The inferior vena cava is normal in size with <50% respiratory   variability, suggesting right atrial pressure of 8 mmHg.  14. Aneurysm of the ascending aorta, measuring 46 mm.  Myocardial Perfusion 04/20/2018  Nuclear stress EF: 50%.  There was no ST segment deviation noted during stress.  No T wave inversion was noted during stress.  The study is normal.  This is a low risk study.  The left ventricular ejection fraction is mildly decreased (45-54%).  Abnormal TID ratio of 1.25 indicates a possibility of balanced ischemia. Though the specificity of this finding is limited in a pharmacologic stress test compared with an exercise stress test.  Past Medical History:  Diagnosis Date  . Anxiety    reports scotch helps that   . Aortic stenosis, mild 08/18/2018   Mean gradient 14 mmHg 04/2018  . Arthritis    " it's everywhere"  . Blood transfusion without reported diagnosis   . Cataract    having cataract surgery July 2016  . Chronic bronchitis ([place])   . Chronic kidney disease    hx kidney stones  . Colon polyps   . Complication of anesthesia    after surgery 07-2013 unable to sleep for 3 days  . Coronary artery calcification seen on CAT scan 08/18/2018  . GERD (gastroesophageal reflux disease)   . History of kidney stones   . Hyperlipidemia   . Hypertension   . Prostate hypertrophy   . Pulmonary embolism ([place]) 12/2014   post TJR  . Seasonal allergies   . 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 Sleep apnea    had surgery in 2000 for sleep apnea, no CPAP needed  . Thoracic aortic aneurysm ([place]) 05/04/2019   4.6 cm 04/2019    Past Surgical History:  Procedure Laterality Date  . CAROTID ENDARTERECTOMY Right Oct 01, 2013   CE  . COLONOSCOPY    . ENDARTERECTOMY Right 10/01/2013   Procedure: ENDARTERECTOMY CAROTID-RIGHT;  Surgeon: Rosetta Posner, MD;  Location: [facility];  Service: Vascular;  Laterality: Right;  . ESOPHAGOGASTRODUODENOSCOPY ENDOSCOPY   10-12-2013  . EXCISION ORAL LESION WITH CO2 LASER N/A 09/08/2015   Procedure: EXCISION ORAL LESION WITH CO2 LASER;  Surgeon: Jerrell Belfast, MD;  Location: [facility];  Service: ENT;  Laterality: N/A;  . EYE SURGERY Bilateral    cataracts, W/IOL  . JOINT REPLACEMENT Right September 02, 2014   Knee  . JOINT REPLACEMENT Left 2012   Knee  . PATCH ANGIOPLASTY Right 10/01/2013   Procedure: PATCH ANGIOPLASTY USING HEMASHIELD FINESSE PATCH;  Surgeon: Rosetta Posner, MD;  Location: [facility];  Service: Vascular;  Laterality: Right;  . removal of melonomia Left    shoulder  . REPLACEMENT TOTAL KNEE Left   . REVERSE SHOULDER ARTHROPLASTY Right 10/11/2016   Procedure: REVERSE RIGHT SHOULDER ARTHROPLASTY;  Surgeon: Netta Cedars, MD;  Location: [facility];  Service: Orthopedics;  Laterality: Right;  . RHINOPLASTY     1999  . RHINOPLASTY    . SKIN  CANCER EXCISION     squamous cell cancer under tongue  . SKIN SURGERY     squamous cell carcinoma removed in various places,melanoma removed from L shoulder  . sleep apnea surgery  2000  . STERIOD INJECTION Left 10/11/2016   Procedure: STEROID INJECTION LEFT SHOULDER;  Surgeon: Netta Cedars, MD;  Location: [facility];  Service: Orthopedics;  Laterality: Left;  . TOTAL HIP ARTHROPLASTY Left 10/01/2017   Procedure: LEFT TOTAL HIP ARTHROPLASTY ANTERIOR APPROACH;  Surgeon: Gaynelle Arabian, MD;  Location[facility];  Service: Orthopedics;  Laterality: Left;  . TOTAL KNEE ARTHROPLASTY Right 09/02/2014   Procedure: RIGHT TOTAL KNEE ARTHROPLASTY;  Surgeon: Netta Cedars, MD;  Location: [facility];  Service: Orthopedics;  Laterality: Right;    MEDICATIONS: . apixaban (ELIQUIS) 5 MG TABS tablet  . B Complex-C (B-COMPLEX WITH VITAMIN C) tablet  . cholecalciferol (VITAMIN D) 1000 units tablet  . esomeprazole (NEXIUM) 40 MG capsule  . fenofibrate (TRICOR) 145 MG tablet  . finasteride (PROSCAR) 5 MG tablet  . fluticasone (FLONASE) 50 MCG/ACT nasal spray  . Fluticasone-Salmeterol (ADVAIR) 100-50  MCG/DOSE AEPB  . losartan-hydrochlorothiazide (HYZAAR) 100-12.5 MG tablet  . Magnesium 100 MG TABS  . NON FORMULARY  . Omega-3 Fatty Acids (FISH OIL) 1000 MG CAPS  . probenecid (BENEMID) 500 MG tablet  . rosuvastatin (CRESTOR) 10 MG tablet  . sildenafil (VIAGRA) 25 MG tablet  . tamsulosin (FLOMAX) 0.4 MG CAPS capsule   No current facility-administered medications for this encounter.    Maia Plan [facility] Pre-Surgical Testing [phone] 11/29/19  2:46 PM

## 2019-11-30 ENCOUNTER — Other Ambulatory Visit (HOSPITAL_COMMUNITY)
Admission: RE | Admit: 2019-11-30 | Discharge: 2019-11-30 | Disposition: A | Discharge status: Home / Self Care | Payer: Medicare Other | Source: Ambulatory Visit | Attending: Orthopedic Surgery | Admitting: Orthopedic Surgery

## 2019-11-30 DIAGNOSIS — Z20822 Contact with and (suspected) exposure to covid-19: Secondary | ICD-10-CM | POA: Diagnosis not present

## 2019-11-30 LAB — SARS CORONAVIRUS 2 (TAT 6-24 HRS): SARS Coronavirus 2: NEGATIVE

## 2019-11-30 NOTE — Anesthesia Preprocedure Evaluation (Addendum)
Anesthesia Evaluation  Patient identified by MRN, date of birth, ID band Patient awake    Reviewed: Allergy & Precautions, NPO status , Patient's Chart, lab work & pertinent test results  History of Anesthesia Complications (+) history of anesthetic complications (trouble sleeping after anesthetic)  Airway Mallampati: III  TM Distance: >3 FB Neck ROM: Full    Dental  (+) Dental Advisory Given, Teeth Intact   Pulmonary sleep apnea (surgical correction) , Current SmokerPatient did not abstain from smoking., PE   Pulmonary exam normal        Cardiovascular hypertension, Pt. on medications + CAD and + Peripheral Vascular Disease  + Valvular Problems/Murmurs AS  Rhythm:Regular Rate:Normal + Systolic murmurs  '21 Carotid US - 1-39% b/l ICAS  '20 TTE - EF 55 to 60%. Mildly increased LVH. Trivial AI. Moderate AS. Vmax 2.8 m/s, MG 24mmHg, AVA 1.3 cm^2, DI 0.3. Trivial MR. Aneurysm of the ascending aorta, measuring 46 mm.     Neuro/Psych PSYCHIATRIC DISORDERS Anxiety negative neurological ROS     GI/Hepatic Neg liver ROS, GERD  Medicated and Controlled,  Endo/Other  negative endocrine ROS  Renal/GU CRFRenal disease     Musculoskeletal  (+) Arthritis ,   Abdominal   Peds  Hematology  On eliquis    Anesthesia Other Findings Covid test negative   Reproductive/Obstetrics                           Anesthesia Physical Anesthesia Plan  ASA: III  Anesthesia Plan: General   Post-op Pain Management:  Regional for Post-op pain   Induction: Intravenous  PONV Risk Score and Plan: 2 and Treatment may vary due to age or medical condition, Ondansetron and Dexamethasone  Airway Management Planned: Oral ETT  Additional Equipment: None  Intra-op Plan:   Post-operative Plan: Extubation in OR  Informed Consent: I have reviewed the patients History and Physical, chart, labs and discussed the procedure  including the risks, benefits and alternatives for the proposed anesthesia with the patient or authorized representative who has indicated his/her understanding and acceptance.     Dental advisory given  Plan Discussed with: CRNA and Anesthesiologist  Anesthesia Plan Comments:       Anesthesia Quick Evaluation

## 2019-12-03 ENCOUNTER — Other Ambulatory Visit: Payer: Self-pay

## 2019-12-03 ENCOUNTER — Ambulatory Visit (HOSPITAL_COMMUNITY): Payer: Medicare Other | Admitting: Physician Assistant

## 2019-12-03 ENCOUNTER — Encounter (HOSPITAL_COMMUNITY)
Admission: RE | Disposition: A | Discharge status: Home / Self Care | Payer: Self-pay | Source: Home / Self Care | Attending: Orthopedic Surgery

## 2019-12-03 ENCOUNTER — Observation Stay (HOSPITAL_COMMUNITY)
Admission: RE | Admit: 2019-12-03 | Discharge: 2019-12-04 | Disposition: A | Discharge status: Home / Self Care | Payer: Medicare Other | Attending: Orthopedic Surgery | Admitting: Orthopedic Surgery

## 2019-12-03 ENCOUNTER — Observation Stay (HOSPITAL_COMMUNITY): Payer: Medicare Other

## 2019-12-03 ENCOUNTER — Encounter (HOSPITAL_COMMUNITY): Payer: Self-pay | Admitting: Orthopedic Surgery

## 2019-12-03 ENCOUNTER — Ambulatory Visit (HOSPITAL_COMMUNITY): Payer: Medicare Other | Admitting: Certified Registered Nurse Anesthetist

## 2019-12-03 DIAGNOSIS — I2510 Atherosclerotic heart disease of native coronary artery without angina pectoris: Secondary | ICD-10-CM | POA: Diagnosis not present

## 2019-12-03 DIAGNOSIS — M75102 Unspecified rotator cuff tear or rupture of left shoulder, not specified as traumatic: Secondary | ICD-10-CM | POA: Diagnosis not present

## 2019-12-03 DIAGNOSIS — Z96612 Presence of left artificial shoulder joint: Secondary | ICD-10-CM

## 2019-12-03 DIAGNOSIS — N189 Chronic kidney disease, unspecified: Secondary | ICD-10-CM | POA: Insufficient documentation

## 2019-12-03 DIAGNOSIS — Z471 Aftercare following joint replacement surgery: Secondary | ICD-10-CM | POA: Diagnosis not present

## 2019-12-03 DIAGNOSIS — I129 Hypertensive chronic kidney disease with stage 1 through stage 4 chronic kidney disease, or unspecified chronic kidney disease: Secondary | ICD-10-CM | POA: Insufficient documentation

## 2019-12-03 DIAGNOSIS — M75101 Unspecified rotator cuff tear or rupture of right shoulder, not specified as traumatic: Secondary | ICD-10-CM | POA: Insufficient documentation

## 2019-12-03 DIAGNOSIS — I350 Nonrheumatic aortic (valve) stenosis: Secondary | ICD-10-CM | POA: Diagnosis not present

## 2019-12-03 DIAGNOSIS — M19012 Primary osteoarthritis, left shoulder: Secondary | ICD-10-CM | POA: Diagnosis not present

## 2019-12-03 DIAGNOSIS — G8918 Other acute postprocedural pain: Secondary | ICD-10-CM | POA: Diagnosis not present

## 2019-12-03 DIAGNOSIS — M67812 Other specified disorders of synovium, left shoulder: Secondary | ICD-10-CM | POA: Diagnosis not present

## 2019-12-03 DIAGNOSIS — R600 Localized edema: Secondary | ICD-10-CM | POA: Diagnosis not present

## 2019-12-03 DIAGNOSIS — Z9889 Other specified postprocedural states: Secondary | ICD-10-CM | POA: Diagnosis not present

## 2019-12-03 HISTORY — PX: REVERSE SHOULDER ARTHROPLASTY: SHX5054

## 2019-12-03 SURGERY — ARTHROPLASTY, SHOULDER, TOTAL, REVERSE
Anesthesia: General | Site: Shoulder | Laterality: Left

## 2019-12-03 MED ORDER — ONDANSETRON HCL 4 MG/2ML IJ SOLN: 4.0000 mg | Freq: Once | INTRAMUSCULAR | Status: DC | PRN

## 2019-12-03 MED ORDER — SODIUM CHLORIDE 0.9 % IV SOLN: INTRAVENOUS | Status: DC

## 2019-12-03 MED ORDER — HYDROCHLOROTHIAZIDE 12.5 MG PO CAPS: 12.5000 mg | ORAL_CAPSULE | Freq: Every day | ORAL | Status: DC

## 2019-12-03 MED ORDER — LIDOCAINE 2% (20 MG/ML) 5 ML SYRINGE
INTRAMUSCULAR | Status: DC | PRN
  Administered 2019-12-03: 60 mg via INTRAVENOUS
  Administered 2019-12-03: 40 mg via INTRAVENOUS

## 2019-12-03 MED ORDER — ONDANSETRON HCL 4 MG/2ML IJ SOLN
INTRAMUSCULAR | Status: AC
  Filled 2019-12-03: qty 2

## 2019-12-03 MED ORDER — TAMSULOSIN HCL 0.4 MG PO CAPS: 0.4000 mg | ORAL_CAPSULE | Freq: Every day | ORAL | Status: DC

## 2019-12-03 MED ORDER — CHLORHEXIDINE GLUCONATE 0.12 % MT SOLN
15.0000 mL | Freq: Once | OROMUCOSAL | Status: AC
  Administered 2019-12-03: 15 mL via OROMUCOSAL

## 2019-12-03 MED ORDER — FENTANYL CITRATE (PF) 100 MCG/2ML IJ SOLN: 25.0000 ug | INTRAMUSCULAR | Status: DC | PRN

## 2019-12-03 MED ORDER — HYDROMORPHONE HCL 1 MG/ML IJ SOLN: 0.5000 mg | INTRAMUSCULAR | Status: DC | PRN

## 2019-12-03 MED ORDER — ORAL CARE MOUTH RINSE: 15.0000 mL | Freq: Once | OROMUCOSAL | Status: AC

## 2019-12-03 MED ORDER — OXYCODONE HCL 5 MG PO TABS: 5.0000 mg | ORAL_TABLET | Freq: Once | ORAL | Status: DC | PRN

## 2019-12-03 MED ORDER — OXYCODONE HCL 5 MG/5ML PO SOLN: 5.0000 mg | Freq: Once | ORAL | Status: DC | PRN

## 2019-12-03 MED ORDER — SUCCINYLCHOLINE CHLORIDE 200 MG/10ML IV SOSY
PREFILLED_SYRINGE | INTRAVENOUS | Status: DC | PRN
  Administered 2019-12-03: 100 mg via INTRAVENOUS

## 2019-12-03 MED ORDER — LOSARTAN POTASSIUM 50 MG PO TABS: 100.0000 mg | ORAL_TABLET | Freq: Every day | ORAL | Status: DC

## 2019-12-03 MED ORDER — BUPIVACAINE-EPINEPHRINE (PF) 0.25% -1:200000 IJ SOLN
INTRAMUSCULAR | Status: DC | PRN
  Administered 2019-12-03: 15 mL

## 2019-12-03 MED ORDER — METHOCARBAMOL 500 MG IVPB - SIMPLE MED
500.0000 mg | Freq: Four times a day (QID) | INTRAVENOUS | Status: DC | PRN
  Filled 2019-12-03: qty 50

## 2019-12-03 MED ORDER — METOCLOPRAMIDE HCL 5 MG PO TABS: 5.0000 mg | ORAL_TABLET | Freq: Three times a day (TID) | ORAL | Status: DC | PRN

## 2019-12-03 MED ORDER — PROPOFOL 10 MG/ML IV BOLUS
INTRAVENOUS | Status: DC | PRN
  Administered 2019-12-03: 80 mg via INTRAVENOUS

## 2019-12-03 MED ORDER — EPHEDRINE SULFATE-NACL 50-0.9 MG/10ML-% IV SOSY
PREFILLED_SYRINGE | INTRAVENOUS | Status: DC | PRN
  Administered 2019-12-03: 10 mg via INTRAVENOUS

## 2019-12-03 MED ORDER — OXYCODONE-ACETAMINOPHEN 5-325 MG PO TABS: 1.0000 | ORAL_TABLET | ORAL | 0 refills | Status: DC | PRN

## 2019-12-03 MED ORDER — MENTHOL 3 MG MT LOZG: 1.0000 | LOZENGE | OROMUCOSAL | Status: DC | PRN

## 2019-12-03 MED ORDER — FENTANYL CITRATE (PF) 100 MCG/2ML IJ SOLN
INTRAMUSCULAR | Status: DC | PRN
  Administered 2019-12-03 (×2): 50 ug via INTRAVENOUS

## 2019-12-03 MED ORDER — SILDENAFIL CITRATE 25 MG PO TABS
25.0000 mg | ORAL_TABLET | Freq: Every day | ORAL | Status: DC | PRN
  Filled 2019-12-03: qty 1

## 2019-12-03 MED ORDER — BISACODYL 10 MG RE SUPP: 10.0000 mg | Freq: Every day | RECTAL | Status: DC | PRN

## 2019-12-03 MED ORDER — PHENYLEPHRINE 40 MCG/ML (10ML) SYRINGE FOR IV PUSH (FOR BLOOD PRESSURE SUPPORT)
PREFILLED_SYRINGE | INTRAVENOUS | Status: DC | PRN
  Administered 2019-12-03 (×5): 120 ug via INTRAVENOUS

## 2019-12-03 MED ORDER — B COMPLEX-C PO TABS
1.0000 | ORAL_TABLET | Freq: Every day | ORAL | Status: DC
  Filled 2019-12-03 (×2): qty 1

## 2019-12-03 MED ORDER — POLYETHYLENE GLYCOL 3350 17 G PO PACK: 17.0000 g | PACK | Freq: Every day | ORAL | Status: DC | PRN

## 2019-12-03 MED ORDER — STERILE WATER FOR IRRIGATION IR SOLN
Status: DC | PRN
  Administered 2019-12-03: 2000 mL

## 2019-12-03 MED ORDER — SUCCINYLCHOLINE CHLORIDE 200 MG/10ML IV SOSY
PREFILLED_SYRINGE | INTRAVENOUS | Status: AC
  Filled 2019-12-03: qty 10

## 2019-12-03 MED ORDER — BUPIVACAINE LIPOSOME 1.3 % IJ SUSP
INTRAMUSCULAR | Status: DC | PRN
  Administered 2019-12-03: 10 mL

## 2019-12-03 MED ORDER — BUPIVACAINE-EPINEPHRINE (PF) 0.25% -1:200000 IJ SOLN
INTRAMUSCULAR | Status: AC
  Filled 2019-12-03: qty 30

## 2019-12-03 MED ORDER — DEXAMETHASONE SODIUM PHOSPHATE 10 MG/ML IJ SOLN
INTRAMUSCULAR | Status: AC
  Filled 2019-12-03: qty 1

## 2019-12-03 MED ORDER — MAGNESIUM OXIDE 400 (241.3 MG) MG PO TABS
200.0000 mg | ORAL_TABLET | Freq: Every day | ORAL | Status: DC
  Filled 2019-12-03: qty 1

## 2019-12-03 MED ORDER — LIDOCAINE 2% (20 MG/ML) 5 ML SYRINGE
INTRAMUSCULAR | Status: AC
  Filled 2019-12-03: qty 5

## 2019-12-03 MED ORDER — LACTATED RINGERS IV SOLN: INTRAVENOUS | Status: DC

## 2019-12-03 MED ORDER — PROPOFOL 10 MG/ML IV BOLUS
INTRAVENOUS | Status: AC
  Filled 2019-12-03: qty 20

## 2019-12-03 MED ORDER — PHENYLEPHRINE HCL (PRESSORS) 10 MG/ML IV SOLN
INTRAVENOUS | Status: AC
  Filled 2019-12-03: qty 1

## 2019-12-03 MED ORDER — DEXAMETHASONE SODIUM PHOSPHATE 10 MG/ML IJ SOLN
INTRAMUSCULAR | Status: DC | PRN
  Administered 2019-12-03: 8 mg via INTRAVENOUS

## 2019-12-03 MED ORDER — ONDANSETRON HCL 4 MG/2ML IJ SOLN
INTRAMUSCULAR | Status: DC | PRN
  Administered 2019-12-03: 4 mg via INTRAVENOUS

## 2019-12-03 MED ORDER — APIXABAN 5 MG PO TABS: 5.0000 mg | ORAL_TABLET | Freq: Two times a day (BID) | ORAL | Status: DC

## 2019-12-03 MED ORDER — FLUTICASONE PROPIONATE 50 MCG/ACT NA SUSP
2.0000 | Freq: Every day | NASAL | Status: DC | PRN
  Filled 2019-12-03: qty 16

## 2019-12-03 MED ORDER — LOSARTAN POTASSIUM-HCTZ 100-12.5 MG PO TABS: 1.0000 | ORAL_TABLET | Freq: Every day | ORAL | Status: DC

## 2019-12-03 MED ORDER — ONDANSETRON HCL 4 MG/2ML IJ SOLN: 4.0000 mg | Freq: Four times a day (QID) | INTRAMUSCULAR | Status: DC | PRN

## 2019-12-03 MED ORDER — DOCUSATE SODIUM 100 MG PO CAPS
100.0000 mg | ORAL_CAPSULE | Freq: Two times a day (BID) | ORAL | Status: DC
  Administered 2019-12-03: 100 mg via ORAL
  Filled 2019-12-03: qty 1

## 2019-12-03 MED ORDER — FENTANYL CITRATE (PF) 100 MCG/2ML IJ SOLN
INTRAMUSCULAR | Status: AC
  Filled 2019-12-03: qty 2

## 2019-12-03 MED ORDER — CLINDAMYCIN PHOSPHATE 900 MG/50ML IV SOLN
900.0000 mg | INTRAVENOUS | Status: AC
  Administered 2019-12-03: 900 mg via INTRAVENOUS
  Filled 2019-12-03: qty 50

## 2019-12-03 MED ORDER — METOCLOPRAMIDE HCL 5 MG/ML IJ SOLN: 5.0000 mg | Freq: Three times a day (TID) | INTRAMUSCULAR | Status: DC | PRN

## 2019-12-03 MED ORDER — FENOFIBRATE 160 MG PO TABS: 160.0000 mg | ORAL_TABLET | Freq: Every day | ORAL | Status: DC

## 2019-12-03 MED ORDER — 0.9 % SODIUM CHLORIDE (POUR BTL) OPTIME
TOPICAL | Status: DC | PRN
  Administered 2019-12-03: 1000 mL

## 2019-12-03 MED ORDER — ROSUVASTATIN CALCIUM 10 MG PO TABS: 10.0000 mg | ORAL_TABLET | Freq: Every day | ORAL | Status: DC

## 2019-12-03 MED ORDER — FENTANYL CITRATE (PF) 100 MCG/2ML IJ SOLN
INTRAMUSCULAR | Status: AC
  Administered 2019-12-03: 50 ug
  Filled 2019-12-03: qty 2

## 2019-12-03 MED ORDER — PHENYLEPHRINE HCL-NACL 10-0.9 MG/250ML-% IV SOLN
INTRAVENOUS | Status: DC | PRN
Start: 2019-12-03 — End: 2019-12-03
  Administered 2019-12-03: 40 ug/min via INTRAVENOUS

## 2019-12-03 MED ORDER — PHENOL 1.4 % MT LIQD
1.0000 | OROMUCOSAL | Status: DC | PRN
  Administered 2019-12-04: 1 via OROMUCOSAL
  Filled 2019-12-03: qty 177

## 2019-12-03 MED ORDER — MOMETASONE FURO-FORMOTEROL FUM 100-5 MCG/ACT IN AERO
2.0000 | INHALATION_SPRAY | Freq: Two times a day (BID) | RESPIRATORY_TRACT | Status: DC
  Administered 2019-12-03: 2 via RESPIRATORY_TRACT
  Filled 2019-12-03: qty 8.8

## 2019-12-03 MED ORDER — CLINDAMYCIN PHOSPHATE 600 MG/50ML IV SOLN
600.0000 mg | Freq: Four times a day (QID) | INTRAVENOUS | Status: DC
  Administered 2019-12-03 – 2019-12-04 (×2): 600 mg via INTRAVENOUS
  Filled 2019-12-03 (×3): qty 50

## 2019-12-03 MED ORDER — FINASTERIDE 5 MG PO TABS: 5.0000 mg | ORAL_TABLET | Freq: Every day | ORAL | Status: DC

## 2019-12-03 MED ORDER — PANTOPRAZOLE SODIUM 40 MG PO TBEC: 40.0000 mg | DELAYED_RELEASE_TABLET | Freq: Every day | ORAL | Status: DC

## 2019-12-03 MED ORDER — ONDANSETRON HCL 4 MG PO TABS: 4.0000 mg | ORAL_TABLET | Freq: Four times a day (QID) | ORAL | Status: DC | PRN

## 2019-12-03 MED ORDER — VITAMIN D 25 MCG (1000 UNIT) PO TABS: 1000.0000 [IU] | ORAL_TABLET | Freq: Every day | ORAL | Status: DC

## 2019-12-03 MED ORDER — BUPIVACAINE HCL (PF) 0.5 % IJ SOLN
INTRAMUSCULAR | Status: DC | PRN
Start: 2019-12-03 — End: 2019-12-03
  Administered 2019-12-03: 15 mL via PERINEURAL

## 2019-12-03 MED ORDER — PROBENECID 500 MG PO TABS
500.0000 mg | ORAL_TABLET | Freq: Every day | ORAL | Status: DC
  Filled 2019-12-03: qty 1

## 2019-12-03 MED ORDER — METHOCARBAMOL 500 MG PO TABS: 500.0000 mg | ORAL_TABLET | Freq: Three times a day (TID) | ORAL | 1 refills | Status: DC | PRN

## 2019-12-03 MED ORDER — METHOCARBAMOL 500 MG PO TABS: 500.0000 mg | ORAL_TABLET | Freq: Four times a day (QID) | ORAL | Status: DC | PRN

## 2019-12-03 MED ORDER — OXYCODONE HCL 5 MG PO TABS: 5.0000 mg | ORAL_TABLET | ORAL | Status: DC | PRN

## 2019-12-03 MED ORDER — ACETAMINOPHEN 325 MG PO TABS: 325.0000 mg | ORAL_TABLET | Freq: Four times a day (QID) | ORAL | Status: DC | PRN

## 2019-12-03 MED ORDER — MIDAZOLAM HCL 2 MG/2ML IJ SOLN
INTRAMUSCULAR | Status: AC
  Filled 2019-12-03: qty 2

## 2019-12-03 SURGICAL SUPPLY — 74 items
AID PSTN UNV HD RSTRNT DISP (MISCELLANEOUS) ×1
BAG SPEC THK2 15X12 ZIP CLS (MISCELLANEOUS)
BAG ZIPLOCK 12X15 (MISCELLANEOUS) IMPLANT
BIT DRILL 1.6MX128 (BIT) IMPLANT
BIT DRILL 170X2.5X (BIT) IMPLANT
BIT DRL 170X2.5X (BIT) ×1
BLADE SAG 18X100X1.27 (BLADE) ×2 IMPLANT
CONTROL EPI SZ1 (Orthopedic Implant) IMPLANT
COVER BACK TABLE 60X90IN (DRAPES) ×2 IMPLANT
COVER SURGICAL LIGHT HANDLE (MISCELLANEOUS) ×2 IMPLANT
COVER WAND RF STERILE (DRAPES) IMPLANT
CTR EPI SZ1 (Orthopedic Implant) ×2 IMPLANT
DECANTER SPIKE VIAL GLASS SM (MISCELLANEOUS) ×2 IMPLANT
DRAPE INCISE IOBAN 66X45 STRL (DRAPES) ×2 IMPLANT
DRAPE ORTHO SPLIT 77X108 STRL (DRAPES) ×4
DRAPE SHEET LG 3/4 BI-LAMINATE (DRAPES) ×2 IMPLANT
DRAPE SURG ORHT 6 SPLT 77X108 (DRAPES) ×2 IMPLANT
DRAPE TOP 10253 STERILE (DRAPES) ×1 IMPLANT
DRAPE U-SHAPE 47X51 STRL (DRAPES) ×2 IMPLANT
DRILL 2.5 (BIT) ×2
DRSG ADAPTIC 3X8 NADH LF (GAUZE/BANDAGES/DRESSINGS) ×2 IMPLANT
DRSG PAD ABDOMINAL 8X10 ST (GAUZE/BANDAGES/DRESSINGS) ×2 IMPLANT
DURAPREP 26ML APPLICATOR (WOUND CARE) ×2 IMPLANT
ELECT BLADE TIP CTD 4 INCH (ELECTRODE) ×2 IMPLANT
ELECT NDL TIP 2.8 STRL (NEEDLE) ×1 IMPLANT
ELECT NEEDLE TIP 2.8 STRL (NEEDLE) ×2 IMPLANT
ELECT REM PT RETURN 15FT ADLT (MISCELLANEOUS) ×2 IMPLANT
FACESHIELD WRAPAROUND (MASK) ×2 IMPLANT
FACESHIELD WRAPAROUND OR TEAM (MASK) IMPLANT
GAUZE SPONGE 4X4 12PLY STRL (GAUZE/BANDAGES/DRESSINGS) ×2 IMPLANT
GLENOSPHERE XTEND RSA 38 SD +4 (Joint) ×1 IMPLANT
GLOVE BIOGEL PI ORTHO PRO 7.5 (GLOVE) ×1
GLOVE BIOGEL PI ORTHO PRO SZ8 (GLOVE) ×1
GLOVE ORTHO TXT STRL SZ7.5 (GLOVE) ×2 IMPLANT
GLOVE PI ORTHO PRO STRL 7.5 (GLOVE) ×1 IMPLANT
GLOVE PI ORTHO PRO STRL SZ8 (GLOVE) ×1 IMPLANT
GLOVE SURG ORTHO 8.5 STRL (GLOVE) ×2 IMPLANT
GOWN STRL REUS W/TWL XL LVL3 (GOWN DISPOSABLE) ×4 IMPLANT
KIT BASIN OR (CUSTOM PROCEDURE TRAY) ×2 IMPLANT
KIT TURNOVER KIT A (KITS) IMPLANT
MANIFOLD NEPTUNE II (INSTRUMENTS) ×2 IMPLANT
METAGLENE DELTA EXTEND (Trauma) IMPLANT
METAGLENE DXTEND (Trauma) ×2 IMPLANT
NDL MAYO CATGUT SZ4 TPR NDL (NEEDLE) IMPLANT
NEEDLE MAYO CATGUT SZ4 (NEEDLE) IMPLANT
NS IRRIG 1000ML POUR BTL (IV SOLUTION) ×2 IMPLANT
PACK SHOULDER (CUSTOM PROCEDURE TRAY) ×2 IMPLANT
PENCIL SMOKE EVACUATOR (MISCELLANEOUS) IMPLANT
PIN GUIDE 1.2 (PIN) ×1 IMPLANT
PIN GUIDE GLENOPHERE 1.5MX300M (PIN) ×1 IMPLANT
PIN METAGLENE 2.5 (PIN) ×1 IMPLANT
PROTECTOR NERVE ULNAR (MISCELLANEOUS) ×2 IMPLANT
RESTRAINT HEAD UNIVERSAL NS (MISCELLANEOUS) ×2 IMPLANT
SCREW 4.5X18MM (Screw) ×2 IMPLANT
SCREW 4.5X36MM (Screw) ×2 IMPLANT
SCREW BN 18X4.5XSTRL SHLDR (Screw) IMPLANT
SLING ARM FOAM STRAP LRG (SOFTGOODS) ×1 IMPLANT
SMARTMIX MINI TOWER (MISCELLANEOUS)
SPACER 38 PLUS 3 (Spacer) ×1 IMPLANT
SPONGE LAP 4X18 RFD (DISPOSABLE) IMPLANT
STEM DELTA DIA 10 HA (Stem) ×1 IMPLANT
STRIP CLOSURE SKIN 1/2X4 (GAUZE/BANDAGES/DRESSINGS) ×2 IMPLANT
SUCTION FRAZIER HANDLE 10FR (MISCELLANEOUS) ×2
SUCTION TUBE FRAZIER 10FR DISP (MISCELLANEOUS) ×1 IMPLANT
SUT FIBERWIRE #2 38 T-5 BLUE (SUTURE) ×4
SUT MNCRL AB 4-0 PS2 18 (SUTURE) ×2 IMPLANT
SUT VIC AB 0 CT1 36 (SUTURE) ×4 IMPLANT
SUT VIC AB 0 CT2 27 (SUTURE) ×2 IMPLANT
SUT VIC AB 2-0 CT1 27 (SUTURE) ×2
SUT VIC AB 2-0 CT1 TAPERPNT 27 (SUTURE) ×1 IMPLANT
SUTURE FIBERWR #2 38 T-5 BLUE (SUTURE) ×2 IMPLANT
TOWEL OR 17X26 10 PK STRL BLUE (TOWEL DISPOSABLE) ×2 IMPLANT
TOWER SMARTMIX MINI (MISCELLANEOUS) IMPLANT
YANKAUER SUCT BULB TIP 10FT TU (MISCELLANEOUS) ×2 IMPLANT

## 2019-12-03 NOTE — Discharge Instructions (Signed)
Ice to the shoulder constantly.  Keep the incision covered and clean and dry for one week, then ok to get it wet in the shower. ° °Do exercise as instructed several times per day. ° °DO NOT reach behind your back or push up out of a chair with the operative arm. ° °Use a sling while you are up and around for comfort, may remove while seated.  Keep pillow propped behind the operative elbow. ° °Follow up with Dr Dajion Bickford in two weeks in the office, call [phone] for appt °

## 2019-12-03 NOTE — Anesthesia Procedure Notes (Signed)
Anesthesia Regional Block: Interscalene brachial plexus block   Pre-Anesthetic Checklist: ,, timeout performed, Correct Patient, Correct Site, Correct Laterality, Correct Procedure, Correct Position, site marked, Risks and benefits discussed,  Surgical consent,  Pre-op evaluation,  At surgeon's request and post-op pain management  Laterality: Left  Prep: chloraprep       Needles:  Injection technique: Single-shot  Needle Type: Echogenic Needle     Needle Length: 5cm  Needle Gauge: 21     Additional Needles:   Narrative:  Start time: 12/03/2019 11:31 AM End time: 12/03/2019 11:35 AM Injection made incrementally with aspirations every 5 mL.  Performed by: Personally  Anesthesiologist: Audry Pili, MD  Additional Notes: No pain on injection. No increased resistance to injection. Injection made in 5cc increments. Good needle visualization. Patient tolerated the procedure well.

## 2019-12-03 NOTE — Transfer of Care (Signed)
Immediate Anesthesia Transfer of Care Note  Patient: [name]  Procedure(s) Performed: REVERSE SHOULDER ARTHROPLASTY (Left Shoulder)  Patient Location: PACU  Anesthesia Type:GA combined with regional for post-op pain  Level of Consciousness: awake and patient cooperative  Airway & Oxygen Therapy: Patient Spontanous Breathing and Patient connected to face mask oxygen  Post-op Assessment: Report given to RN and Post -op Vital signs reviewed and stable  Post vital signs: Reviewed and stable  Last Vitals:  Vitals Value Taken Time  BP 105/68 12/03/19 1515  Temp 36.4 C 12/03/19 1510  Pulse 99 12/03/19 1515  Resp 23 12/03/19 1515  SpO2 99 % 12/03/19 1515  Vitals shown include unvalidated device data.  Last Pain:  Vitals:   12/03/19 1140  TempSrc:   PainSc: 0-No pain      Patients Stated Pain Goal: 0 (33/44/83 0159)  Complications: No complications documented.

## 2019-12-03 NOTE — Anesthesia Procedure Notes (Signed)
Procedure Name: Intubation Date/Time: 12/03/2019 1:25 PM Performed by: Montel Clock, CRNA Pre-anesthesia Checklist: Patient identified, Emergency Drugs available, Suction available, Patient being monitored and Timeout performed Patient Re-evaluated:Patient Re-evaluated prior to induction Oxygen Delivery Method: Circle system utilized Preoxygenation: Pre-oxygenation with 100% oxygen Induction Type: IV induction and Rapid sequence Laryngoscope Size: Mac and 3 Grade View: Grade II Tube type: Oral Tube size: 7.5 mm Number of attempts: 1 Airway Equipment and Method: Stylet Placement Confirmation: ETT inserted through vocal cords under direct vision,  positive ETCO2 and breath sounds checked- equal and bilateral Secured at: 23 cm Tube secured with: Tape Dental Injury: Teeth and Oropharynx as per pre-operative assessment

## 2019-12-03 NOTE — Interval H&P Note (Signed)
History and Physical Interval Note:  12/03/2019 12:42 PM  [name]  has presented today for surgery, with the diagnosis of Left shoulder cuff arthropathy.  The various methods of treatment have been discussed with the patient and family. After consideration of risks, benefits and other options for treatment, the patient has consented to  Procedure(s) with comments: REVERSE SHOULDER ARTHROPLASTY (Left) - interscalene block as a surgical intervention.  The patient's history has been reviewed, patient examined, no change in status, stable for surgery.  I have reviewed the patient's chart and labs.  Questions were answered to the patient's satisfaction.     Augustin Schooling

## 2019-12-03 NOTE — Brief Op Note (Signed)
12/03/2019  3:01 PM  PATIENT:  [name]  79 y.o. male  PRE-OPERATIVE DIAGNOSIS:  Left shoulder cuff arthropathy  POST-OPERATIVE DIAGNOSIS:  Left shoulder cuff arthropathy, end stage  PROCEDURE:  Procedure(s) with comments: REVERSE SHOULDER ARTHROPLASTY (Left) - interscalene block Depuy Delta Xtend   SURGEON:  Surgeon(s) and Role:    Netta Cedars, MD - Primary  PHYSICIAN ASSISTANT:   ASSISTANTS: [name], PA-C   ANESTHESIA:   regional and general  EBL:  100 mL   BLOOD ADMINISTERED:none  DRAINS: none   LOCAL MEDICATIONS USED:  MARCAINE     SPECIMEN:  No Specimen  DISPOSITION OF SPECIMEN:  N/A  COUNTS:  YES  TOURNIQUET:  * No tourniquets in log *  DICTATION: .Other Dictation: Dictation Number [id number]  PLAN OF CARE: Admit for overnight observation  PATIENT DISPOSITION:  PACU - hemodynamically stable.   Delay start of Pharmacological VTE agent (>24hrs) due to surgical blood loss or risk of bleeding: not applicable

## 2019-12-03 NOTE — Progress Notes (Signed)
AssistedDr. Brock with left, ultrasound guided, interscalene  block. Side rails up, monitors on throughout procedure. See vital signs in flow sheet. Tolerated Procedure well.  

## 2019-12-04 DIAGNOSIS — M19012 Primary osteoarthritis, left shoulder: Secondary | ICD-10-CM | POA: Diagnosis not present

## 2019-12-04 LAB — BASIC METABOLIC PANEL
Anion gap: 7 (ref 5–15)
BUN: 25 mg/dL — ABNORMAL HIGH (ref 8–23)
CO2: 27 mmol/L (ref 22–32)
Calcium: 9.1 mg/dL (ref 8.9–10.3)
Chloride: 102 mmol/L (ref 98–111)
Creatinine, Ser: 1.14 mg/dL (ref 0.61–1.24)
GFR calc Af Amer: >60 mL/min (ref >60)
GFR calc non Af Amer: >60 mL/min (ref >60)
Glucose, Bld: 148 mg/dL — ABNORMAL HIGH (ref 70–99)
Potassium: 4.6 mmol/L (ref 3.5–5.1)
Sodium: 136 mmol/L (ref 135–145)

## 2019-12-04 LAB — HEMOGLOBIN AND HEMATOCRIT, BLOOD
HCT: 44.1 % (ref 39.0–52.0)
Hemoglobin: 14.1 g/dL (ref 13.0–17.0)

## 2019-12-04 NOTE — Plan of Care (Signed)
  Problem: Health Behavior/Discharge Planning: Goal: Ability to manage health-related needs will improve Outcome: Progressing   Problem: Clinical Measurements: Goal: Ability to maintain clinical measurements within normal limits will improve Outcome: Progressing Goal: Will remain free from infection Outcome: Progressing Goal: Diagnostic test results will improve Outcome: Progressing Goal: Respiratory complications will improve Outcome: Progressing Goal: Cardiovascular complication will be avoided Outcome: Progressing   Problem: Activity: Goal: Risk for activity intolerance will decrease Outcome: Progressing   Problem: Nutrition: Goal: Adequate nutrition will be maintained Outcome: Progressing   Problem: Coping: Goal: Level of anxiety will decrease Outcome: Progressing   Problem: Elimination: Goal: Will not experience complications related to bowel motility Outcome: Progressing   Problem: Safety: Goal: Ability to remain free from injury will improve Outcome: Progressing   Problem: Skin Integrity: Goal: Risk for impaired skin integrity will decrease Outcome: Progressing   Problem: Education: Goal: Knowledge of the prescribed therapeutic regimen will improve Outcome: Progressing Goal: Understanding of activity limitations/precautions following surgery will improve Outcome: Progressing   Problem: Activity: Goal: Ability to tolerate increased activity will improve Outcome: Progressing   Problem: Pain Management: Goal: Pain level will decrease with appropriate interventions Outcome: Progressing

## 2019-12-04 NOTE — Evaluation (Signed)
Occupational Therapy Evaluation Patient Details Name: [name] MRN: [id number] DOB: 09-Jul-1940 Today's Date: 12/04/2019    History of Present Illness Patient is a 79 year old man  s/p Left reverse shoulder replacement   Clinical Impression   [name] is a 79 year old man s/p left shoulder replacement who presents without functional use of left nondominant upper extremity. Therapist provided education and instruction to patient and spouse in regards to exercises, precautions, positioning, donning upper extremity clothing and bathing while maintaining shoulder precautions, ice and edema management and donning/doffing sling. Patient and spouse verbalized understanding and demonstrated as needed. Patient and spouse demonstrated ability to perform dressing and donning sling. Patient to follow up with MD for further therapy needs.      Follow Up Recommendations  Follow surgeon's recommendation for DC plan and follow-up therapies    Equipment Recommendations  None recommended by OT    Recommendations for Other Services       Precautions / Restrictions Precautions Precautions: Shoulder Shoulder Interventions: Shoulder sling/immobilizer Precaution Booklet Issued: No Required Braces or Orthoses: Sling Restrictions Weight Bearing Restrictions: Yes LUE Weight Bearing: Non weight bearing Other Position/Activity Restrictions: NWB, AROM of wrist,elbow and hand, No PROM, No AROM      Mobility Bed Mobility Overal bed mobility: Modified Independent                Transfers Overall transfer level: Modified independent                    Balance Overall balance assessment: No apparent balance deficits (not formally assessed)                                         ADL either performed or assessed with clinical judgement   ADL Overall ADL's : Needs assistance/impaired Eating/Feeding: Independent   Grooming: Modified independent   Upper Body  Bathing: Minimal assistance   Lower Body Bathing: Minimal assistance   Upper Body Dressing : Moderate assistance;Adhering to UE precautions   Lower Body Dressing: Minimal assistance;Sit to/from stand   Toilet Transfer: Modified Independent   Toileting- Clothing Manipulation and Hygiene: Modified independent       Functional mobility during ADLs: Modified independent       Vision   Vision Assessment?: No apparent visual deficits     Perception     Praxis      Pertinent Vitals/Pain Pain Assessment: No/denies pain (secondary to interscalen block)     Hand Dominance Right   Extremity/Trunk Assessment Upper Extremity Assessment Upper Extremity Assessment: LUE deficits/detail LUE Deficits / Details: Non functional use of LUE secondary to interscalene block ,shoulder precautions and decreased ROM/strength s/p surgery       Cervical / Trunk Assessment Cervical / Trunk Assessment: Normal   Communication     Cognition Arousal/Alertness: Awake/alert Behavior During Therapy: WFL for tasks assessed/performed Overall Cognitive Status: Within Functional Limits for tasks assessed                                     General Comments       Exercises     Shoulder Instructions Shoulder Instructions Donning/doffing shirt without moving shoulder: Patient able to independently direct caregiver;Caregiver independent with task Method for sponge bathing under operated UE: Caregiver independent with task;Patient able to  independently direct caregiver Donning/doffing sling/immobilizer: Patient able to independently direct caregiver;Caregiver independent with task;Moderate assistance Correct positioning of sling/immobilizer: Caregiver independent with task;Patient able to independently direct caregiver ROM for elbow, wrist and digits of operated UE: Patient able to independently direct caregiver;Caregiver independent with task;Independent Sling wearing schedule (on at  all times/off for ADL's): Patient able to independently direct caregiver;Caregiver independent with task Proper positioning of operated UE when showering: Patient able to independently direct caregiver;Caregiver independent with task Dressing change: Patient able to independently direct caregiver;Caregiver independent with task Positioning of UE while sleeping: Patient able to independently direct caregiver;Caregiver independent with task    Home Living Family/patient expects to be discharged to:: Private residence Living Arrangements: Spouse/significant other Available Help at Discharge: Available 24 hours/day;Family   Home Access: Stairs to enter Entrance Stairs-Number of Steps: 3 Entrance Stairs-Rails: Right;Left;Can reach both       Bathroom Shower/Tub: Occupational psychologist: Standard Bathroom Accessibility: Yes              Prior Functioning/Environment Level of Independence: Independent                 OT Problem List: Decreased strength;Decreased range of motion;Impaired UE functional use      OT Treatment/Interventions:      OT Goals(Current goals can be found in the care plan section) Acute Rehab OT Goals OT Goal Formulation: All assessment and education complete, DC therapy  OT Frequency:     Barriers to D/C:            Co-evaluation              AM-PAC OT "6 Clicks" Daily Activity     Outcome Measure Help from another person eating meals?: None Help from another person taking care of personal grooming?: None Help from another person toileting, which includes using toliet, bedpan, or urinal?: A Little Help from another person bathing (including washing, rinsing, drying)?: A Little Help from another person to put on and taking off regular upper body clothing?: A Little Help from another person to put on and taking off regular lower body clothing?: A Little 6 Click Score: 20   End of Session Nurse Communication:  (okay to  dc)  Activity Tolerance: Patient tolerated treatment well Patient left: in bed;with family/visitor present;with call bell/phone within reach  OT Visit Diagnosis: Muscle weakness (generalized) (M62.81)                Time: 2694-8546 OT Time Calculation (min): 21 min Charges:  OT General Charges $OT Visit: 1 Visit OT Evaluation $OT Eval Low Complexity: 1 Low  [name], OTR/L [place]  Office [phone] Pager: [phone]   Lenward Chancellor 12/04/2019, 9:42 AM

## 2019-12-04 NOTE — Progress Notes (Addendum)
Subjective: 1 Day Post-Op Procedure(s) (LRB): REVERSE SHOULDER ARTHROPLASTY (Left) Patient seen in rounds for Dr. [name] Patient reports pain as 1 on 0-10 scale.   Patient still reports block working No concerns overnight  Objective: Vital signs in last 24 hours: Temp:  [97.6 F (36.4 C)-99 F (37.2 C)] 97.6 F (36.4 C) (07/17 0522) Pulse Rate:  [75-99] 78 (07/17 0522) Resp:  [15-25] 18 (07/17 0522) BP: (105-130)/(59-86) 119/86 (07/17 0522) SpO2:  [93 %-100 %] 100 % (07/17 0522) Weight:  [85.3 kg] 85.3 kg (07/16 0950)  Intake/Output from previous day: 07/16 0701 - 07/17 0700 In: 1688.4 [P.O.:750; I.V.:838.4; IV Piggyback:100] Out: 775 [Urine:675; Blood:100] Intake/Output this shift: No intake/output data recorded.  Recent Labs    12/04/19 0321  HGB 14.1   Recent Labs    12/04/19 0321  HCT 44.1   Recent Labs    12/04/19 0321  NA 136  K 4.6  CL 102  CO2 27  BUN 25*  CREATININE 1.14  GLUCOSE 148*  CALCIUM 9.1   No results for input(s): LABPT, INR in the last 72 hours.  Neurologically intact Neurovascular intact Sensation intact distally Intact pulses distally Incision: dressing C/D/I No cellulitis present Compartment soft Able to make a fist  Assessment/Plan: 1 Day Post-Op Procedure(s) (LRB): REVERSE SHOULDER ARTHROPLASTY (Left) See OT, if cleared by OT okay to be discharged today As block continues to wear off make sure pain is well controlled with PO pain meds Call to make an appointment for follow up with Dr. Sampson Si, PA-C [facility] [phone] 12/04/2019, 8:47 AM    Agree with above assessment and plan. D/C to home

## 2019-12-04 NOTE — Progress Notes (Signed)
Patient discharged to home w/ SO. Verbalized understanding of all instructions. Home w/ all belongings, instructions, equipment. Refused all am medications, prefers to take at home. Escorted to pov via w/c.

## 2019-12-04 NOTE — Discharge Summary (Signed)
Orthopedic Discharge Summary        Physician Discharge Summary  Patient ID: [name] MRN: [id number] DOB/AGE: 1940/06/27 79 y.o.  Admit date: 12/03/2019 Discharge date: 12/04/2019   Procedures:  Procedure(s) (LRB): REVERSE SHOULDER ARTHROPLASTY (Left)  Attending Physician:  Dr. 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 Anxiety    reports scotch helps that   . Aortic stenosis, mild 08/18/2018   Mean gradient 14 mmHg 04/2018  . Arthritis    " it's everywhere"  . Blood transfusion without reported diagnosis   . Cataract    having cataract surgery July 2016  . Chronic bronchitis ([place])   . Chronic kidney disease    hx kidney stones  . Colon polyps   . Complication of anesthesia    after surgery 07-2013 unable to sleep for 3 days  . Coronary artery calcification seen on CAT scan 08/18/2018  . GERD (gastroesophageal reflux disease)   . History of kidney stones   . Hyperlipidemia   . Hypertension   . Prostate hypertrophy   . Pulmonary embolism ([place]) 12/2014   post TJR  . Seasonal allergies   . 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 Sleep apnea    had surgery in 2000 for sleep apnea, no CPAP needed  . Thoracic aortic aneurysm ([place]) 05/04/2019   4.6 cm 04/2019    PCP: Shon Baton, MD   Discharged Condition: good  Hospital Course:  Patient underwent the above stated procedure on 12/03/2019. Patient tolerated the procedure well and brought to the recovery room in good condition and subsequently to the floor. Patient had an uncomplicated hospital course and was stable for discharge.   Disposition: Discharge disposition: 01-Home or Self Care      with follow up in 2 weeks    Follow-up Information    Netta Cedars, MD. Call in 2 weeks.   Specialty: Orthopedic  Surgery Why: [phone] Contact information: [address] [place] [address] [phone]               Discharge Instructions    Call MD / Call 911   Complete by: As directed    If you experience chest pain or shortness of breath, CALL 911 and be transported to the hospital emergency room.  If you develope a fever above 101 F, pus (white drainage) or increased drainage or redness at the wound, or calf pain, call your surgeon's office.   Call MD / Call 911   Complete by: As directed    If you experience chest pain or shortness of breath, CALL 911 and be transported to the hospital emergency room.  If you develope a fever above 101 F, pus (white drainage) or increased drainage or redness at the wound, or calf pain, call your surgeon's office.   Constipation Prevention   Complete by: As directed    Drink plenty of fluids.  Prune juice may be helpful.  You may use a stool softener, such as Colace (over the counter) 100 mg twice a day.  Use MiraLax (over the counter) for constipation as needed.   Constipation Prevention   Complete by: As directed    Drink plenty of fluids.  Prune juice may be helpful.  You may use  a stool softener, such as Colace (over the counter) 100 mg twice a day.  Use MiraLax (over the counter) for constipation as needed.   Diet - low sodium heart healthy   Complete by: As directed    Diet - low sodium heart healthy   Complete by: As directed    Increase activity slowly as tolerated   Complete by: As directed    Increase activity slowly as tolerated   Complete by: As directed       Allergies as of 12/04/2019      Reactions   Cefaclor Other (See Comments)    skin split   Penicillins Rash   Has patient had a PCN reaction causing immediate rash, facial/tongue/throat swelling, SOB or lightheadedness with hypotension:unsure Has patient had a PCN reaction causing severe rash involving mucus membranes or skin necrosis:unsure Has patient had  a PCN reaction that required hospitalization:No Has patient had a PCN reaction occurring within the last 10 years:No--childhood allergy If all of the above answers are "NO", then may proceed with Cephalosporin use.      Medication List    TAKE these medications   B-complex with vitamin C tablet Take 1 tablet by mouth daily.   cholecalciferol 1000 units tablet Commonly known as: VITAMIN D Take 1,000 Units by mouth daily.   Eliquis 5 MG Tabs tablet Generic drug: apixaban Take 5 mg by mouth 2 (two) times daily.   esomeprazole 40 MG capsule Commonly known as: NEXIUM Take 40 mg by mouth daily as needed (acid reflux).   fenofibrate 145 MG tablet Commonly known as: TRICOR Take 145 mg by mouth daily before breakfast.   finasteride 5 MG tablet Commonly known as: PROSCAR Take 5 mg by mouth daily.   Fish Oil 1000 MG Caps Take 1,000 mg by mouth daily.   fluticasone 50 MCG/ACT nasal spray Commonly known as: FLONASE Place 2 sprays into both nostrils daily as needed for allergies or rhinitis.   Fluticasone-Salmeterol 100-50 MCG/DOSE Aepb Commonly known as: ADVAIR Inhale 1 puff into the lungs 2 (two) times daily as needed (for respiratory issues.).   losartan-hydrochlorothiazide 100-12.5 MG tablet Commonly known as: HYZAAR Take 1 tablet by mouth daily.   Magnesium 100 MG Tabs Take 100 mg by mouth daily.   methocarbamol 500 MG tablet Commonly known as: Robaxin Take 1 tablet (500 mg total) by mouth every 8 (eight) hours as needed for muscle spasms.   NON FORMULARY Take 1,000 mg by mouth daily. CBD Oil   oxyCODONE-acetaminophen 5-325 MG tablet Commonly known as: Percocet Take 1 tablet by mouth every 4 (four) hours as needed for severe pain.   probenecid 500 MG tablet Commonly known as: BENEMID Take 500 mg by mouth daily before breakfast.   rosuvastatin 10 MG tablet Commonly known as: CRESTOR Take 10 mg by mouth daily.   sildenafil 25 MG tablet Commonly known as:  VIAGRA Take 25 mg by mouth daily as needed for erectile dysfunction.   tamsulosin 0.4 MG Caps capsule Commonly known as: FLOMAX Take 0.4 mg by mouth daily.         Signed: Augustin Schooling 12/04/2019, 9:13 AM  [facility] is now [facility] [address], [place], [place], [place] [address] Phone: [place]

## 2019-12-04 NOTE — Plan of Care (Signed)
  Problem: Safety: Goal: Ability to remain free from injury will improve 12/04/2019 0730 by Blase Mess, RN Outcome: Progressing 12/04/2019 0730 by Blase Mess, RN Outcome: Progressing   Problem: Education: Goal: Knowledge of the prescribed therapeutic regimen will improve 12/04/2019 0730 by Blase Mess, RN Outcome: Progressing 12/04/2019 0730 by Blase Mess, RN Outcome: Progressing Goal: Understanding of activity limitations/precautions following surgery will improve 12/04/2019 0730 by Blase Mess, RN Outcome: Progressing 12/04/2019 0730 by Blase Mess, RN Outcome: Progressing   Problem: Activity: Goal: Ability to tolerate increased activity will improve 12/04/2019 0730 by Blase Mess, RN Outcome: Progressing 12/04/2019 0730 by Blase Mess, RN Outcome: Progressing   Problem: Pain Management: Goal: Pain level will decrease with appropriate interventions 12/04/2019 0730 by Blase Mess, RN Outcome: Progressing 12/04/2019 0730 by Blase Mess, RN Outcome: Progressing

## 2019-12-04 NOTE — Op Note (Signed)
NAME: GEORG, ANG MEDICAL RECORD ZO:[id number] ACCOUNT [id number] DATE OF BIRTH:07-May-1941 FACILITY: [facility] LOCATION: [facility] PHYSICIAN:STEVEN Orlena Sheldon, MD  OPERATIVE REPORT  DATE OF PROCEDURE:  12/03/2019  PREOPERATIVE DIAGNOSIS:  Left shoulder rotator cuff tear arthropathy, end-stage.  POSTOPERATIVE DIAGNOSIS:  Left shoulder rotator cuff tear arthropathy, end-stage.  PROCEDURE PERFORMED:  Left reverse total shoulder replacement with no subscapularis repair.  ATTENDING SURGEON:  Esmond Plants, MD  ASSISTANT:  Darol Destine, [place], who was scrubbed during the entire procedure and necessary for satisfactory completion of surgery.  ANESTHESIA:  General anesthesia was used plus interscalene block.  ESTIMATED BLOOD LOSS:  200 mL.  FLUID REPLACEMENT:  1500 mL crystalloid.  INSTRUMENT COUNTS:  Correct.  COMPLICATIONS:  No complications.  ANTIBIOTICS:  Perioperative antibiotics were given.  INDICATIONS:  The patient is a 79 year old male who is active, who presents with worsening left shoulder pain and dysfunction secondary to end-stage arthritis and rotator cuff tear arthropathy.  The patient has had an extended period of conservative  management and failed that, presents now for reverse shoulder replacement to restore function and restore fixed focal mechanics to the shoulder as well as eliminate pain.  Risks including but not limited to infection and dislocation were discussed with  the patient.  Informed consent obtained.  DESCRIPTION OF PROCEDURE:  After an adequate level of anesthesia was achieved, the patient was positioned in modified beach chair position.  Left shoulder correctly identified and sterilely prepped and draped in the usual manner.  Time-out called,  verifying correct patient, correct site and we used a deltopectoral incision starting at the coracoid process, extending down to the anterior humerus with a 10 blade scalpel.  Dissection down through subcutaneous  tissues using Bovie.  Cephalic vein  identified and taken laterally with the deltoid pectoralis taken medially.  Conjoined tendon identified and retracted medially.  Deep retractors placed.  Biceps was tenodesed in situ with 0 Vicryl figure-of-eight suture x2 incorporating the pectoralis  tendon.  We then released the subscapularis and tagged for repair at the end.  We released the inferior capsule progressively externally rotating it.  We then resected the patient's remaining rotator cuff tissue anteriorly, keeping the teres minor in the  back.  We extended the shoulder externally rotating and delivering the humeral head out of the wound.  We placed a 6 mm reamer proximally and then reamed up to a size 10 intramedullary and then used the intramedullary resection guide to resect the head  at 10 degrees of retroversion using an oscillating saw.  We used a rongeur to remove excess osteophytes from the medial aspect of the proximal humerus.  We then subluxed the humerus posteriorly, did a 360 degree capsular labral excision.  Multiple loose  bodies were removed.  Once we had good exposure of the glenoid face, we placed our guide pin for the metaglene/baseplate preparation, centering low and angling a little inferiorly.  We used the reamer and reamed down to good bony bed.  Some of the  inferior osteophyte off the glenoid had come off, so we had perfectly centered and very low baseplate placement.  Once we had our reaming done, we used a peripheral hand reamer to make room for the glenosphere.  We then drilled our central peg hole and  impacted the metaglene baseplate into position, used a 36 inferior screw, a 36 screw at the base of coracoid both locked and an 18 nonlocked posteriorly.  Great baseplate security.  We then placed a 38+4 glenosphere onto  the baseplate and screwed that  securely.  We did a finger sweep to make sure no soft tissue was incorporated in that bearing.  Next, we finished our preparation  on the humeral side.  We were a 1 centered epiphysis, and that was placed on the 0 setting on a 10 stem.  We trialled with  that and a 38+3 poly, reduced the shoulder and had nice soft tissue tensioning and nice stability with inferior pull and with external rotation and no restriction of movement.  No impingement.  We removed all trial components from the humeral side.  We  used available bone graft from the head, irrigated thoroughly the humeral shaft prior to using impaction grafting technique, available bone graft and the HA coated press-fit stem and we impacted the 10 stem with the 1 centered, set on the 0 setting and  impacted in 10 degrees of retroversion with available bone graft.  We had a nice secure stem, used a 38+3 real poly, impacted that on the tray and then reduced the shoulder.  Again pleased with range of motion and stability.  We irrigated thoroughly and  then closed.  I did note that I could do a little bit of soft tissue closure incorporating part of the biceps and also the lower part of the subscap.  We did repair that with multiple figure-of-eight sutures of 0 Vicryl suture, but I would not call that  a formal subscap repair.  Next, the subscap was really thin superiorly, a little bit more beefy inferiorly and we just repaired that inferior piece.  At this point, after thorough irrigation, repaired the deltopectoral interval with 0 Vicryl suture  followed by 2-0 Vicryl for subcutaneous closure and 4-0 Monocryl for skin.  Steri-Strips applied followed by sterile dressing.  The patient tolerated surgery well.  PN/NUANCE  D:12/03/2019 T:12/04/2019 JOB:[id number]

## 2019-12-06 ENCOUNTER — Encounter (HOSPITAL_COMMUNITY): Payer: Self-pay | Admitting: Orthopedic Surgery

## 2019-12-06 DIAGNOSIS — S81801D Unspecified open wound, right lower leg, subsequent encounter: Secondary | ICD-10-CM | POA: Diagnosis not present

## 2019-12-06 DIAGNOSIS — C44722 Squamous cell carcinoma of skin of right lower limb, including hip: Secondary | ICD-10-CM | POA: Diagnosis not present

## 2019-12-07 NOTE — Anesthesia Postprocedure Evaluation (Signed)
Anesthesia Post Note  Patient: [name]  Procedure(s) Performed: REVERSE SHOULDER ARTHROPLASTY (Left Shoulder)     Patient location during evaluation: PACU Anesthesia Type: General Level of consciousness: awake and alert Pain management: pain level controlled Vital Signs Assessment: post-procedure vital signs reviewed and stable Respiratory status: spontaneous breathing, nonlabored ventilation and respiratory function stable Cardiovascular status: blood pressure returned to baseline and stable Postop Assessment: no apparent nausea or vomiting Anesthetic complications: no   No complications documented.  Last Vitals:  Vitals:   12/04/19 0236 12/04/19 0522  BP: 122/74 119/86  Pulse: 75 78  Resp: 20 18  Temp: 36.4 C 36.4 C  SpO2: 98% 100%    Last Pain:  Vitals:   12/04/19 0745  TempSrc:   PainSc: 0-No pain                 Audry Pili

## 2019-12-09 DIAGNOSIS — Z4789 Encounter for other orthopedic aftercare: Secondary | ICD-10-CM | POA: Diagnosis not present

## 2019-12-21 DIAGNOSIS — M109 Gout, unspecified: Secondary | ICD-10-CM | POA: Diagnosis not present

## 2019-12-21 DIAGNOSIS — E7849 Other hyperlipidemia: Secondary | ICD-10-CM | POA: Diagnosis not present

## 2019-12-21 DIAGNOSIS — Z125 Encounter for screening for malignant neoplasm of prostate: Secondary | ICD-10-CM | POA: Diagnosis not present

## 2019-12-21 DIAGNOSIS — E559 Vitamin D deficiency, unspecified: Secondary | ICD-10-CM | POA: Diagnosis not present

## 2019-12-21 DIAGNOSIS — R739 Hyperglycemia, unspecified: Secondary | ICD-10-CM | POA: Diagnosis not present

## 2019-12-27 ENCOUNTER — Other Ambulatory Visit: Payer: Self-pay

## 2019-12-27 ENCOUNTER — Encounter: Payer: Self-pay | Admitting: Cardiovascular Disease

## 2019-12-27 ENCOUNTER — Ambulatory Visit (INDEPENDENT_AMBULATORY_CARE_PROVIDER_SITE_OTHER): Payer: Medicare Other | Admitting: Cardiovascular Disease

## 2019-12-27 VITALS — BP 110/70 | HR 91 | Ht 70.0 in | Wt 184.0 lb

## 2019-12-27 DIAGNOSIS — I350 Nonrheumatic aortic (valve) stenosis: Secondary | ICD-10-CM | POA: Diagnosis not present

## 2019-12-27 DIAGNOSIS — I6522 Occlusion and stenosis of left carotid artery: Secondary | ICD-10-CM | POA: Diagnosis not present

## 2019-12-27 DIAGNOSIS — I7121 Aneurysm of the ascending aorta, without rupture: Secondary | ICD-10-CM

## 2019-12-27 DIAGNOSIS — I712 Thoracic aortic aneurysm, without rupture, unspecified: Secondary | ICD-10-CM

## 2019-12-27 DIAGNOSIS — I10 Essential (primary) hypertension: Secondary | ICD-10-CM

## 2019-12-27 DIAGNOSIS — I2510 Atherosclerotic heart disease of native coronary artery without angina pectoris: Secondary | ICD-10-CM | POA: Diagnosis not present

## 2019-12-27 DIAGNOSIS — Z01812 Encounter for preprocedural laboratory examination: Secondary | ICD-10-CM

## 2019-12-27 MED ORDER — FUROSEMIDE 20 MG PO TABS: ORAL_TABLET | ORAL | 3 refills | Status: DC

## 2019-12-27 NOTE — Patient Instructions (Signed)
Medication Instructions:  FUROSEMIDE 20 MG AS NEEDED FOR SWELLING   *If you need a refill on your cardiac medications before your next appointment, please call your pharmacy*  Lab Work: BMET 1 [place] TO CT IN December   If you have labs (blood work) drawn today and your tests are completely normal, you will receive your results only by: Marland Kitchen MyChart Message (if you have MyChart) OR . A paper copy in the mail If you have any lab test that is abnormal or we need to change your treatment, we will call you to review the results.  Testing/Procedures: CTA OF CHEST AND ABDOMEN IN December   Follow-Up: At [facility], you and your health needs are our priority.  As part of our continuing mission to provide you with exceptional heart care, we have created designated Provider Care Teams.  These Care Teams include your primary Cardiologist (physician) and Advanced Practice Providers (APPs -  Physician Assistants and Nurse Practitioners) who all work together to provide you with the care you need, when you need it.  We recommend signing up for the patient portal called "MyChart".  Sign up information is provided on this After Visit Summary.  MyChart is used to connect with patients for Virtual Visits (Telemedicine).  Patients are able to view lab/test results, encounter notes, upcoming appointments, etc.  Non-urgent messages can be sent to your provider as well.   To learn more about what you can do with MyChart, go to [email or web address].    Your next appointment:   6 month(s)  You will receive a reminder letter in the mail two months in advance. If you don't receive a letter, please call our office to schedule the follow-up appointment.  The format for your next appointment:   In Person  Provider:   You may see DR [facility] or one of the following Advanced Practice Providers on your designated Care Team:    Kerin Ransom, PA-C  [place], [place]  Coletta Memos, [place]

## 2019-12-27 NOTE — Progress Notes (Signed)
Cardiology Office Note   Date:  12/27/2019   ID:  [name], [name] 1940-08-09, MRN [id number]  PCP:  Shon Baton, MD  Cardiologist:   Skeet Latch, MD   No chief complaint on file.    History of Present Illness: [name] is a 79 y.o. male withasymptomatic coronary calcification, mild aortic stenosis, ascending aortic aneurysm, carotid stenosis status post right CEA,hypertension,emphysema,prior PE,multiple skin cancers,and GI bleedhere for follow up. He was initially seen 03/2018 for the evaluation ofcoronary calcification.  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 This occurred in the setting of LE DVT. He had a follow up chest CT 03/16/18 that revealed atherosclerosis of the aorta and all three coronary vessels including the left main. 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 He was feeling well but didn't get much formal exercise so he was referred for a Lexiscan Myoview 04/2018 that revealed no ischemia.  TID ratio was 1.25.  He was also noted to have a systolic murmur and had an echo that revealed LVEF 21-30%, grade 1 diastolic dysfunction and mild aortic stenosis (mean gradient 14 mmHg).  Repeat echo 04/2019 the mean gradient was 18 mmHg.  The ascending aorta was 4.6 cm.  Since his last appointment he had his L shoulder replaced 11/2019.  He had a great result and only reports mild soreness.  He does PT and has been walking for exercise.  He feels like his legs give out when he walks.  He saw [facility] [phone] and had ABIs that were normal.  He feels like his legs are building up lactic acid. He also has bursitis in the R hip.  He has known back issues.  He notes increased swelling in his ankles and feet.  He has no orthopnea or PND.  His chronic cough is sometimes productive but unchanged.  He has no lightheadedness or dizziness when it occurs. He denies palpitations, LH or dizzy.  He has no trouble going yup and down stairs.  His BP has  been well-controlled when checked.  He has some mild lower extremity edema.  He does limit salt intake and tries to use compression stockings.  He had a Mohs surgery on his right leg and has not been able to use it lately.  He has not been diving lately but does want to go back to shallow water diving.  Past Medical History:  Diagnosis Date  . Anxiety    reports scotch helps that   . Aortic stenosis, mild 08/18/2018   Mean gradient 14 mmHg 04/2018  . Arthritis    " it's everywhere"  . Blood transfusion without reported diagnosis   . Cataract    having cataract surgery July 2016  . Chronic bronchitis ([place])   . Chronic kidney disease    hx kidney stones  . Colon polyps   . Complication of anesthesia    after surgery 07-2013 unable to sleep for 3 days  . Coronary artery calcification seen on CAT scan 08/18/2018  . GERD (gastroesophageal reflux disease)   . History of kidney stones   . Hyperlipidemia   . Hypertension   . Prostate hypertrophy   . Pulmonary embolism ([place]) 12/2014   post TJR  . Seasonal allergies   . 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 Sleep apnea    had surgery in 2000 for  sleep apnea, no CPAP needed  . Thoracic aortic aneurysm ([place]) 05/04/2019   4.6 cm 04/2019    Past Surgical History:  Procedure Laterality Date  . CAROTID ENDARTERECTOMY Right Oct 01, 2013   CE  . COLONOSCOPY    . ENDARTERECTOMY Right 10/01/2013   Procedure: ENDARTERECTOMY CAROTID-RIGHT;  Surgeon: Rosetta Posner, MD;  Location: [place] [facility];  Service: Vascular;  Laterality: Right;  . ESOPHAGOGASTRODUODENOSCOPY ENDOSCOPY  10-12-2013  . EXCISION ORAL LESION WITH CO2 LASER N/A 09/08/2015   Procedure: EXCISION ORAL LESION WITH CO2 LASER;  Surgeon: Jerrell Belfast, MD;  Location: [facility];  Service: ENT;  Laterality: N/A;  . EYE SURGERY Bilateral    cataracts, W/IOL  . JOINT REPLACEMENT Right September 02, 2014   Knee  . JOINT  REPLACEMENT Left 2012   Knee  . PATCH ANGIOPLASTY Right 10/01/2013   Procedure: PATCH ANGIOPLASTY USING HEMASHIELD FINESSE PATCH;  Surgeon: Rosetta Posner, MD;  Location: [facility];  Service: Vascular;  Laterality: Right;  . removal of melonomia Left    shoulder  . REPLACEMENT TOTAL KNEE Left   . REVERSE SHOULDER ARTHROPLASTY Right 10/11/2016   Procedure: REVERSE RIGHT SHOULDER ARTHROPLASTY;  Surgeon: Netta Cedars, MD;  Location: [facility];  Service: Orthopedics;  Laterality: Right;  . REVERSE SHOULDER ARTHROPLASTY Left 12/03/2019   Procedure: REVERSE SHOULDER ARTHROPLASTY;  Surgeon: Netta Cedars, MD;  Location: [facility];  Service: Orthopedics;  Laterality: Left;  interscalene block  . RHINOPLASTY     1999  . RHINOPLASTY    . SKIN CANCER EXCISION     squamous cell cancer under tongue  . SKIN SURGERY     squamous cell carcinoma removed in various places,melanoma removed from L shoulder  . sleep apnea surgery  2000  . STERIOD INJECTION Left 10/11/2016   Procedure: STEROID INJECTION LEFT SHOULDER;  Surgeon: Netta Cedars, MD;  Location: [facility];  Service: Orthopedics;  Laterality: Left;  . TOTAL HIP ARTHROPLASTY Left 10/01/2017   Procedure: LEFT TOTAL HIP ARTHROPLASTY ANTERIOR APPROACH;  Surgeon: Gaynelle Arabian, MD;  Location: [facility];  Service: Orthopedics;  Laterality: Left;  . TOTAL KNEE ARTHROPLASTY Right 09/02/2014   Procedure: RIGHT TOTAL KNEE ARTHROPLASTY;  Surgeon: Netta Cedars, MD;  Location: [facility];  Service: Orthopedics;  Laterality: Right;     Current Outpatient Medications  Medication Sig Dispense Refill  . apixaban (ELIQUIS) 5 MG TABS tablet Take 5 mg by mouth 2 (two) times daily.     . B Complex-C (B-COMPLEX WITH VITAMIN C) tablet Take 1 tablet by mouth daily.    . cholecalciferol (VITAMIN D) 1000 units tablet Take 1,000 Units by mouth daily.    Marland Kitchen esomeprazole (NEXIUM) 40 MG capsule Take 40 mg by mouth daily as needed (acid reflux).     . fenofibrate (TRICOR) 145 MG tablet Take 145 mg by  mouth daily before breakfast.     . finasteride (PROSCAR) 5 MG tablet Take 5 mg by mouth daily.    . fluticasone (FLONASE) 50 MCG/ACT nasal spray Place 2 sprays into both nostrils daily as needed for allergies or rhinitis.    . Fluticasone-Salmeterol (ADVAIR) 100-50 MCG/DOSE AEPB Inhale 1 puff into the lungs 2 (two) times daily as needed (for respiratory issues.).    Marland Kitchen losartan-hydrochlorothiazide (HYZAAR) 100-12.5 MG tablet Take 1 tablet by mouth daily.    . Magnesium 100 MG TABS Take 100 mg by mouth daily.     . methocarbamol (ROBAXIN) 500 MG tablet Take 1 tablet (500 mg total) by mouth  every 8 (eight) hours as needed for muscle spasms. 40 tablet 1  . NON FORMULARY Take 1,000 mg by mouth daily. CBD Oil    . Omega-3 Fatty Acids (FISH OIL) 1000 MG CAPS Take 1,000 mg by mouth daily.    Marland Kitchen oxyCODONE-acetaminophen (PERCOCET) 5-325 MG tablet Take 1 tablet by mouth every 4 (four) hours as needed for severe pain. 30 tablet 0  . probenecid (BENEMID) 500 MG tablet Take 500 mg by mouth daily before breakfast.     . rosuvastatin (CRESTOR) 10 MG tablet Take 10 mg by mouth daily.    . sildenafil (VIAGRA) 25 MG tablet Take 25 mg by mouth daily as needed for erectile dysfunction.    . tamsulosin (FLOMAX) 0.4 MG CAPS capsule Take 0.4 mg by mouth daily.     . furosemide (LASIX) 20 MG tablet As needed for swelling 30 tablet 3   No current facility-administered medications for this visit.    Allergies:   Cefaclor and Penicillins    Social History:  The patient  reports that he has been smoking cigars. He has smoked for the past 50.00 years. He has never used smokeless tobacco. He reports current alcohol use of about 21.0 standard drinks of alcohol per week. He reports that he does not use drugs.   Family History:  The patient's family history includes Diverticulosis in his mother; Heart attack in his father and mother; Heart disease in his father; Hyperlipidemia in his father; Skin cancer in his mother;  Varicose Veins in his mother.    ROS:  Please see the history of present illness.   Otherwise, review of systems are positive for none.   All other systems are reviewed and negative.    PHYSICAL EXAM: VS:  BP 110/70   Pulse 91   Ht 5\' 10"  (1.778 m)   Wt 184 lb (83.5 kg)   BMI 26.40 kg/m  , BMI Body mass index is 26.4 kg/m. GENERAL:  Well appearing HEENT: Pupils equal round and reactive, fundi not visualized, oral mucosa unremarkable NECK:  No jugular venous distention, waveform within normal limits, carotid upstroke brisk and symmetric, no bruits LUNGS:  Clear to auscultation bilaterally HEART:  RRR.  PMI not displaced or sustained,S1 and S2 within normal limits, no S3, no S4, no clicks, no rubs, no murmurs ABD:  Flat, positive bowel sounds normal in frequency in pitch, no bruits, no rebound, no guarding, no midline pulsatile mass, no hepatomegaly, no splenomegaly EXT:  2 plus pulses throughout, no edema, no cyanosis no clubbing SKIN:  No rashes no nodules NEURO:  Cranial nerves II through XII grossly intact, motor grossly intact throughout PSYCH:  Cognitively intact, oriented to person place and time  EKG:  EKG is ordered today. The ekg ordered 05/04/19 demonstrates sinus rhythm.  Rate 83 bpm. 12/27/19: Sinus rhythm.  Rate 91 bpm.  PAC with aberrant conduction  Lexiscan Myoview 04/20/18:  Nuclear stress EF: 50%.  There was no ST segment deviation noted during stress.  No T wave inversion was noted during stress.  The study is normal.  This is a low risk study.  The left ventricular ejection fraction is mildly decreased (45-54%). Abnormal TID ratio of 1.25 indicates a possibility of balanced ischemia. Though the specificity of this finding is limited in a pharmacologic stress test compared with an exercise stress test.  Echo 04/20/18: Study Conclusions  - Left ventricle: The cavity size was normal. Wall thickness was increased in a pattern of mild LVH. Systolic  function was normal. The estimated ejection fraction was in the range of 60% to 65%. Doppler parameters are consistent with abnormal left ventricular relaxation (grade 1 diastolic dysfunction). - Aortic valve: Trileaflet; severely calcified leaflets. There was mild to moderate stenosis. Mean gradient (S): 14 mm Hg. Valve area (VTI): 1.49 cm^2. - Ascending aorta: The ascending aorta was mildly dilated to 40 mm. - Mitral valve: Moderately calcified annulus. There was trivial regurgitation. - Right ventricle: The cavity size was normal. Systolic function was normal. - Pulmonary arteries: No complete TR doppler jet so unable to estimate PA systolic pressure. - Inferior vena cava: The vessel was normal in size. The respirophasic diameter changes were in the normal range (= 50%), consistent with normal central venous pressure.  Impressions:  - Normal LV size with mild LV hypertrophy. EF 60-65%. Normal RV size and systolic function. Mild to moderate AS with mean gradient 14 mmHg and AVA 1.49 cm^2.   Recent Labs: 11/25/2019: Platelets 153 12/04/2019: BUN 25; Creatinine, Ser 1.14; Hemoglobin 14.1; Potassium 4.6; Sodium 136    Lipid Panel No results found for: CHOL, TRIG, HDL, CHOLHDL, VLDL, LDLCALC, LDLDIRECT    Wt Readings from Last 3 Encounters:  12/27/19 184 lb (83.5 kg)  12/03/19 188 lb (85.3 kg)  11/25/19 188 lb (85.3 kg)      ASSESSMENT AND PLAN:  # Coronary calcifications: # Hyperlipidemia:   Asymptomatic.  Stress test was negative 04/2018.  We continue the importance stressed the importance of diet and exercise.  Lipids are followed by Dr. Virgina Jock.  LDL goal <70.  No aspirin given that he is on Eliquis.    # Ascending aorta aneurysm: # Descending aorta aneurysm:  4.6 cm on echo 04/2019 increased from 4.0 cm 04/2018.  The ascending aorta was 4.3 cm on CT-a 04/2019.  He also had a 3 cm infrarenal abdominal aortic aneurysm.  We will repeat the CT  04/2020.  He understands the recommendation is that he not scuba dive.  He is unwilling to quit.  He will only dive in water less than 50 feet.  He does not plan on doing any 200 foot dives anymore.  He will always check his blood pressure before he dies to make sure that it is not elevated.  Continue losartan and HCTZ.  Consider adding a beta-blocker.  # Tobacco: Patient is not interested in stopping his cigars. Cessation was advised.  # Hypertension:Blood pressure remains well-controlled on losartan/HCTZ.   # Mild aortic stenosis: Mean gradient was 18 mmHg on echo 04/2019.  He is asymptomatic.  It will be repeated 04/2020.  # Leg weakness:  Not vascular.  ABIs normal in 2019.  He has known lumbar disc disease.    Current medicines are reviewed at length with the patient today.  The patient does not have concerns regarding medicines.  The following changes have been made:  no change  Labs/ tests ordered today include:   Orders Placed This Encounter  Procedures  . CT ANGIO CHEST AORTA W/CM & OR WO/CM  . CT ANGIO ABDOMEN W &/OR WO CONTRAST  . Basic metabolic panel  . EKG 12-Lead     Disposition:   FU with Anetta Olvera C. Oval Linsey, MD, [facility] in 6 months.      Signed, Kurtiss Wence C. Oval Linsey, MD, [facility]  12/27/2019 1:17 PM    [place]

## 2019-12-28 DIAGNOSIS — F17210 Nicotine dependence, cigarettes, uncomplicated: Secondary | ICD-10-CM | POA: Diagnosis not present

## 2019-12-28 DIAGNOSIS — I739 Peripheral vascular disease, unspecified: Secondary | ICD-10-CM | POA: Diagnosis not present

## 2019-12-28 DIAGNOSIS — I6529 Occlusion and stenosis of unspecified carotid artery: Secondary | ICD-10-CM | POA: Diagnosis not present

## 2019-12-28 DIAGNOSIS — I350 Nonrheumatic aortic (valve) stenosis: Secondary | ICD-10-CM | POA: Diagnosis not present

## 2019-12-28 DIAGNOSIS — I1310 Hypertensive heart and chronic kidney disease without heart failure, with stage 1 through stage 4 chronic kidney disease, or unspecified chronic kidney disease: Secondary | ICD-10-CM | POA: Diagnosis not present

## 2019-12-28 DIAGNOSIS — N1831 Chronic kidney disease, stage 3a: Secondary | ICD-10-CM | POA: Diagnosis not present

## 2019-12-28 DIAGNOSIS — I2510 Atherosclerotic heart disease of native coronary artery without angina pectoris: Secondary | ICD-10-CM | POA: Diagnosis not present

## 2019-12-28 DIAGNOSIS — Z1212 Encounter for screening for malignant neoplasm of rectum: Secondary | ICD-10-CM | POA: Diagnosis not present

## 2019-12-28 DIAGNOSIS — E669 Obesity, unspecified: Secondary | ICD-10-CM | POA: Diagnosis not present

## 2019-12-28 DIAGNOSIS — Z0000 Encounter for general adult medical examination without abnormal findings: Secondary | ICD-10-CM | POA: Diagnosis not present

## 2019-12-28 DIAGNOSIS — J439 Emphysema, unspecified: Secondary | ICD-10-CM | POA: Diagnosis not present

## 2019-12-28 DIAGNOSIS — I700 Atherosclerosis of aorta: Secondary | ICD-10-CM | POA: Diagnosis not present

## 2019-12-28 DIAGNOSIS — R82998 Other abnormal findings in urine: Secondary | ICD-10-CM | POA: Diagnosis not present

## 2019-12-28 DIAGNOSIS — I712 Thoracic aortic aneurysm, without rupture: Secondary | ICD-10-CM | POA: Diagnosis not present

## 2020-01-06 DIAGNOSIS — M25551 Pain in right hip: Secondary | ICD-10-CM | POA: Diagnosis not present

## 2020-01-07 DIAGNOSIS — C44722 Squamous cell carcinoma of skin of right lower limb, including hip: Secondary | ICD-10-CM | POA: Diagnosis not present

## 2020-01-10 DIAGNOSIS — R3914 Feeling of incomplete bladder emptying: Secondary | ICD-10-CM | POA: Diagnosis not present

## 2020-01-10 DIAGNOSIS — N5201 Erectile dysfunction due to arterial insufficiency: Secondary | ICD-10-CM | POA: Diagnosis not present

## 2020-01-10 DIAGNOSIS — N401 Enlarged prostate with lower urinary tract symptoms: Secondary | ICD-10-CM | POA: Diagnosis not present

## 2020-01-18 DIAGNOSIS — K1370 Unspecified lesions of oral mucosa: Secondary | ICD-10-CM | POA: Diagnosis not present

## 2020-01-18 DIAGNOSIS — C069 Malignant neoplasm of mouth, unspecified: Secondary | ICD-10-CM | POA: Diagnosis not present

## 2020-02-03 DIAGNOSIS — Z23 Encounter for immunization: Secondary | ICD-10-CM | POA: Diagnosis not present

## 2020-02-19 DIAGNOSIS — Z23 Encounter for immunization: Secondary | ICD-10-CM | POA: Diagnosis not present

## 2020-03-01 ENCOUNTER — Ambulatory Visit (INDEPENDENT_AMBULATORY_CARE_PROVIDER_SITE_OTHER): Payer: Medicare Other | Admitting: Internal Medicine

## 2020-03-01 ENCOUNTER — Other Ambulatory Visit: Payer: Self-pay

## 2020-03-01 VITALS — BP 108/64 | HR 100 | Ht 69.0 in | Wt 184.8 lb

## 2020-03-01 DIAGNOSIS — I6522 Occlusion and stenosis of left carotid artery: Secondary | ICD-10-CM

## 2020-03-01 DIAGNOSIS — F17210 Nicotine dependence, cigarettes, uncomplicated: Secondary | ICD-10-CM | POA: Diagnosis not present

## 2020-03-01 DIAGNOSIS — J441 Chronic obstructive pulmonary disease with (acute) exacerbation: Secondary | ICD-10-CM

## 2020-03-01 DIAGNOSIS — Z129 Encounter for screening for malignant neoplasm, site unspecified: Secondary | ICD-10-CM | POA: Diagnosis not present

## 2020-03-01 DIAGNOSIS — Z86711 Personal history of pulmonary embolism: Secondary | ICD-10-CM

## 2020-03-01 MED ORDER — PREDNISONE 10 MG PO TABS: ORAL_TABLET | ORAL | 0 refills | Status: DC

## 2020-03-01 NOTE — Addendum Note (Signed)
Addended by: Lorretta Harp on: 03/01/2020 03:13 PM   Modules accepted: Orders

## 2020-03-01 NOTE — Progress Notes (Signed)
OV 02/01/2019  Subjective:  Patient ID: [name], male , DOB: [birth year] , age 79 y.o. , MRN: [id number] , ADDRESS: [address] [place] [address]   02/01/2019 -   Chief Complaint  Patient presents with  . History of Pulmonary Embolism     ICD-10-CM   1. History of pulmonary embolism  Z86.711   2. Smoking greater than 40 pack years  F17.210   3. Screening for cancer  Z12.9   4. Chronic bronchitis, unspecified chronic bronchitis type [facility] [place])  J42      HPI [name] 79 y.o. -1 year follow-up for the above issues.  Last visit was in June 2019.  He continues to smoke.  But he denies any complaints.  He is frustrated that because of the pandemic and national emergency he has not been able to scuba dive.  In terms of his pulmonary embolism he continues Eliquis at full dose.  At last visit in June 2019 his d-dimer was elevated so we decided to continue his Eliquis at full dose.  It is now 4 years since he had his clot.  He is only had one blood clot so far.  There has been no recurrence.  He is open to retesting for d-dimer and deciding to take lower dose Eliquis.  His last CT scan for lung cancer screening was in October 2019 to was Halloween.  He can have his next scan in November 2020.  He is aware that after age 70 that will be normal lung cancer screening.  He continues his Advair for chronic bronchitis.  He is already had his flu shot with his primary care physician.   Results for AMMAR, MOFFATT "[name]" (MRN [id number]) as of 02/01/2019 09:06  Ref. Range 11/02/2015 09:09 06/18/2016 09:42 11/12/2017 12:53  D-Dimer, America Brown Latest Ref Range: <0.50 mcg/mL FEU 0.33 0.33 0.64 (H)    ROS - per HPI   OV 03/01/2020  Subjective:  Patient ID: [name], male , DOB: Oct 08, 1940 , age 32 y.o. , MRN: [id number] , ADDRESS: [place] [address] PCP Shon Baton, MD Patient Care Team: Shon Baton, MD as PCP - General (Internal Medicine) Brand Males, MD as  Consulting Physician (Pulmonary Disease) Netta Cedars, MD as Consulting Physician (Orthopedic Surgery)  This Provider for this visit: Treatment Team:  Attending Provider: Brand Males, MD    03/01/2020 -   Chief Complaint  Patient presents with  . Follow-up    Pt states he has been doing fine since last visit. States he had a water leak in his refrigerator and also had been living in a house full of dust. Due to this, he has been coughing more occ getting up clear phlegm and also has had some wheezing at night.   #Follow-up history of pulmonary embolism #Smoking history #Cancer screening #Emphysema/COPD  HPI [name] 79 y.o. -returns for 1 year follow-up.  He tells me that early August 2021 he had flooding in his kitchen and has been 2 months of work which is completed 2 weeks ago.  During this time there was heavy dust exposure inside the house.  Since then his cough is worse.  He thinks his wheezing might be worse a little bit too.  The house is now clean.  Nevertheless he is having worsening cough.  He continues to smoke.  His last cancer screening of the lung CT scan was 1 year ago.  In terms of his pulmonary  embolism: We told him to reduce his Eliquis to half dose.  He called back saying he will do 5 mg once daily along with baby aspirin.  However he is not done that he is not on baby aspirin but he is taking Eliquis at full dose.  At this point in time he is finished 5 years of full dose Eliquis treatment without any recurrence of blood clot.  Last year his D-dimer was normal.  He not had any DVTs or PE or any bleeding episodes.    Results for Derek, Bender "[name]" (MRN [id number]) as of 03/01/2020 14:50  Ref. Range 11/02/2015 09:09 06/18/2016 09:42 11/12/2017 12:53 02/01/2019 10:02  D-Dimer, Quant Latest Ref Range: <0.50 mcg/mL FEU 0.33 0.33 0.64 (H) 0.42   ROS - per HPI IMPRESSION: 1. Lung-RADS Category 1, negative. Continue annual screening with low-dose chest CT  without contrast in 12 months. 2. Ascending thoracic aortic aneurysm is stable at 4.3 cm diameter. Recommend annual imaging followup by CTA or MRA. This recommendation follows 2010 ACCF/AHA/AATS/ACR/ASA/SCA/SCAI/SIR/STS/SVM Guidelines for the Diagnosis and Management of Patients with Thoracic Aortic Disease. Circulation. 2010; 121: T024-O973. Aortic aneurysm NOS (ICD10-I71.9) 3. Coronary artery atherosclerosis. 4. Bilateral adrenal adenomas. 5. Emphysema (ICD10-J43.9).   Electronically Signed   By: Misty Stanley M.D.   On: 03/25/2019 14:01    has a past medical history of Anxiety, Aortic stenosis, mild (08/18/2018), Arthritis, Blood transfusion without reported diagnosis, Cataract, Chronic bronchitis ([place]), Chronic kidney disease, Colon polyps, Complication of anesthesia, Coronary artery calcification seen on CAT scan (08/18/2018), GERD (gastroesophageal reflux disease), History of kidney stones, Hyperlipidemia, Hypertension, Prostate hypertrophy, Pulmonary embolism ([place]) (12/2014), Seasonal allergies, Skin cancer, Sleep apnea, and Thoracic aortic aneurysm ([place]) (05/04/2019).   reports that he has been smoking cigars. He has smoked for the past 50.00 years. He has never used smokeless tobacco.  Past Surgical History:  Procedure Laterality Date  . CAROTID ENDARTERECTOMY Right Oct 01, 2013   CE  . COLONOSCOPY    . ENDARTERECTOMY Right 10/01/2013   Procedure: ENDARTERECTOMY CAROTID-RIGHT;  Surgeon: Rosetta Posner, MD;  Location: [facility];  Service: Vascular;  Laterality: Right;  . ESOPHAGOGASTRODUODENOSCOPY ENDOSCOPY  10-12-2013  . EXCISION ORAL LESION WITH CO2 LASER N/A 09/08/2015   Procedure: EXCISION ORAL LESION WITH CO2 LASER;  Surgeon: Jerrell Belfast, MD;  Location: [facility];  Service: ENT;  Laterality: N/A;  . EYE SURGERY Bilateral    cataracts, W/IOL  . JOINT REPLACEMENT Right September 02, 2014   Knee  . JOINT REPLACEMENT Left 2012   Knee  . PATCH ANGIOPLASTY Right 10/01/2013    Procedure: PATCH ANGIOPLASTY USING HEMASHIELD FINESSE PATCH;  Surgeon: Rosetta Posner, MD;  Location: [facility];  Service: Vascular;  Laterality: Right;  . removal of melonomia Left    shoulder  . REPLACEMENT TOTAL KNEE Left   . REVERSE SHOULDER ARTHROPLASTY Right 10/11/2016   Procedure: REVERSE RIGHT SHOULDER ARTHROPLASTY;  Surgeon: Netta Cedars, MD;  Location: [facility];  Service: Orthopedics;  Laterality: Right;  . REVERSE SHOULDER ARTHROPLASTY Left 12/03/2019   Procedure: REVERSE SHOULDER ARTHROPLASTY;  Surgeon: Netta Cedars, MD;  Location: [facility];  Service: Orthopedics;  Laterality: Left;  interscalene block  . RHINOPLASTY     1999  . RHINOPLASTY    . SKIN CANCER EXCISION     squamous cell cancer under tongue  . SKIN SURGERY     squamous cell carcinoma removed in various places,melanoma removed from L shoulder  . sleep apnea surgery  2000  .  STERIOD INJECTION Left 10/11/2016   Procedure: STEROID INJECTION LEFT SHOULDER;  Surgeon: Netta Cedars, MD;  Location: [facility];  Service: Orthopedics;  Laterality: Left;  . TOTAL HIP ARTHROPLASTY Left 10/01/2017   Procedure: LEFT TOTAL HIP ARTHROPLASTY ANTERIOR APPROACH;  Surgeon: Gaynelle Arabian, MD;  Location: [facility];  Service: Orthopedics;  Laterality: Left;  . TOTAL KNEE ARTHROPLASTY Right 09/02/2014   Procedure: RIGHT TOTAL KNEE ARTHROPLASTY;  Surgeon: Netta Cedars, MD;  Location: [facility];  Service: Orthopedics;  Laterality: Right;    Allergies  Allergen Reactions  . Cefaclor Other (See Comments)     skin split  . Penicillins Rash    Has patient had a PCN reaction causing immediate rash, facial/tongue/throat swelling, SOB or lightheadedness with hypotension:unsure Has patient had a PCN reaction causing severe rash involving mucus membranes or skin necrosis:unsure Has patient had a PCN reaction that required hospitalization:No Has patient had a PCN reaction occurring within the last 10 years:No--childhood allergy If all of the above answers are "NO",  then may proceed with Cephalosporin use.     Immunization History  Administered Date(s) Administered  . Influenza Split 02/17/2014  . Influenza, High Dose Seasonal PF 01/01/2019, 02/12/2020  . Influenza,inj,Quad PF,6+ Mos 01/19/2015, 02/18/2016  . Influenza,inj,quad, With Preservative 02/17/2017  . PFIZER SARS-COV-2 Vaccination 05/31/2019, 06/20/2019, 02/03/2020  . Pneumococcal-Unspecified 05/20/2012, 02/17/2017  . Zoster 05/20/2012    Family History  Problem Relation Age of Onset  . Diverticulosis Mother   . Skin cancer Mother   . Varicose Veins Mother   . Heart attack Mother   . Hyperlipidemia Father   . Heart disease Father   . Heart attack Father   . Colon cancer Neg Hx   . Esophageal cancer Neg Hx   . Rectal cancer Neg Hx   . Stomach cancer Neg Hx      Current Outpatient Medications:  .  apixaban (ELIQUIS) 5 MG TABS tablet, Take 5 mg by mouth 2 (two) times daily. , Disp: , Rfl:  .  B Complex-C (B-COMPLEX WITH VITAMIN C) tablet, Take 1 tablet by mouth daily., Disp: , Rfl:  .  cholecalciferol (VITAMIN D) 1000 units tablet, Take 1,000 Units by mouth daily., Disp: , Rfl:  .  esomeprazole (NEXIUM) 40 MG capsule, Take 40 mg by mouth daily as needed (acid reflux). , Disp: , Rfl:  .  fenofibrate (TRICOR) 145 MG tablet, Take 145 mg by mouth daily before breakfast. , Disp: , Rfl:  .  finasteride (PROSCAR) 5 MG tablet, Take 5 mg by mouth daily., Disp: , Rfl:  .  fluticasone (FLONASE) 50 MCG/ACT nasal spray, Place 2 sprays into both nostrils daily as needed for allergies or rhinitis., Disp: , Rfl:  .  Fluticasone-Salmeterol (ADVAIR) 100-50 MCG/DOSE AEPB, Inhale 1 puff into the lungs 2 (two) times daily as needed (for respiratory issues.)., Disp: , Rfl:  .  furosemide (LASIX) 20 MG tablet, As needed for swelling, Disp: 30 tablet, Rfl: 3 .  losartan-hydrochlorothiazide (HYZAAR) 100-12.5 MG tablet, Take 1 tablet by mouth daily., Disp: , Rfl:  .  Magnesium 100 MG TABS, Take 100 mg by  mouth daily. , Disp: , Rfl:  .  methocarbamol (ROBAXIN) 500 MG tablet, Take 1 tablet (500 mg total) by mouth every 8 (eight) hours as needed for muscle spasms., Disp: 40 tablet, Rfl: 1 .  NON FORMULARY, Take 1,000 mg by mouth daily. CBD Oil, Disp: , Rfl:  .  Omega-3 Fatty Acids (FISH OIL) 1000 MG CAPS, Take 1,000 mg by  mouth daily., Disp: , Rfl:  .  oxyCODONE-acetaminophen (PERCOCET) 5-325 MG tablet, Take 1 tablet by mouth every 4 (four) hours as needed for severe pain., Disp: 30 tablet, Rfl: 0 .  probenecid (BENEMID) 500 MG tablet, Take 500 mg by mouth daily before breakfast. , Disp: , Rfl:  .  rosuvastatin (CRESTOR) 10 MG tablet, Take 10 mg by mouth daily., Disp: , Rfl:  .  sildenafil (VIAGRA) 25 MG tablet, Take 25 mg by mouth daily as needed for erectile dysfunction., Disp: , Rfl:  .  tamsulosin (FLOMAX) 0.4 MG CAPS capsule, Take 0.4 mg by mouth daily. , Disp: , Rfl:       Objective:   Vitals:   03/01/20 1447  BP: 108/64  Pulse: 100  SpO2: 96%  Weight: 184 lb 12.8 oz (83.8 kg)  Height: 5\' 9"  (1.753 m)    Estimated body mass index is 27.29 kg/m as calculated from the following:   Height as of this encounter: 5\' 9"  (1.753 m).   Weight as of this encounter: 184 lb 12.8 oz (83.8 kg).  @WEIGHTCHANGE @  [employer]   03/01/20 1447  Weight: 184 lb 12.8 oz (83.8 kg)     Physical Exam    General: No distress. Looks well. Smells of tobacco Neuro: Alert and Oriented x 3. GCS 15. Speech normal Psych: Pleasant Resp:  Barrel Chest - no.  Wheeze - mild at base, Crackles - no, No overt respiratory distress CVS: Normal heart sounds. Murmurs - no Ext: Stigmata of Connective Tissue Disease - no HEENT: Normal upper airway. PEERL +. No post nasal drip        Assessment:       ICD-10-CM   1. History of pulmonary embolism  Z86.711   2. Moderate cigarette smoker (10-19 per day)  F17.210   3. Screening for cancer  Z12.9   4. COPD with acute exacerbation ([place])  J44.1          Plan:     Patient Instructions  History of pulmonary embolism -  5 years complet this august 2021 - all on full dose eliquis without recurrence - June 2017 and jan 2018 d-dimer normal but June 2019 d-dimer elevated and sept 2020 -ddimer was normal  -ongoing risk factor for PE is smoking and varicose veints (minor)  Plan  - reduce eliquis to 2.5mg  twice daily  Smoking and cancer screen - do low dose CT chest wo contrast in Nov 2021 and we will call with results  - try to quit smoking   COPD Exacerbation  - due to home dust exposure  Plan  - continue advair as needed - Take prednisone 40 mg daily x 2 days, then 20mg  daily x 2 days, then 10mg  daily x 2 days, then 5mg  daily x 2 days and stop   Followup  -12 months or sooer     SIGNATURE    Dr. Brand Males, M.D., F.C.C.P,  Pulmonary and Critical Care Medicine Staff Physician, [facility] Director - Interstitial Lung Disease  Program  Pulmonary [place] at [place], [place], [address]  Pager: [phone], If no answer or between  15:00h - 7:00h: call [phone] Telephone: [phone]  3:08 PM 03/01/2020

## 2020-03-01 NOTE — Patient Instructions (Addendum)
History of pulmonary embolism -  5 years complet this august 2021 - all on full dose eliquis without recurrence - June 2017 and jan 2018 d-dimer normal but June 2019 d-dimer elevated and sept 2020 -ddimer was normal  -ongoing risk factor for PE is smoking and varicose veints (minor)  Plan  - reduce eliquis to 2.5mg  twice daily  Smoking and cancer screen - do low dose CT chest wo contrast in Nov 2021 and we will call with results  - try to quit smoking   COPD Exacerbation  - due to home dust exposure  Plan  - continue advair as needed - Take prednisone 40 mg daily x 2 days, then 20mg  daily x 2 days, then 10mg  daily x 2 days, then 5mg  daily x 2 days and stop   Followup  -12 months or sooer

## 2020-03-02 ENCOUNTER — Telehealth: Payer: Self-pay | Admitting: Internal Medicine

## 2020-03-02 DIAGNOSIS — M25512 Pain in left shoulder: Secondary | ICD-10-CM | POA: Diagnosis not present

## 2020-03-02 DIAGNOSIS — M542 Cervicalgia: Secondary | ICD-10-CM | POA: Diagnosis not present

## 2020-03-02 MED ORDER — APIXABAN 2.5 MG PO TABS
2.5000 mg | ORAL_TABLET | Freq: Two times a day (BID) | ORAL | 3 refills | Status: DC
Start: 2020-03-02 — End: 2020-06-28

## 2020-03-02 NOTE — Telephone Encounter (Signed)
Called and spoke with patient  Placed order for eliquis 2.5mg  twice daily per MR AVS from OV on 03/01/20. Nothing further needed at this time.

## 2020-03-07 DIAGNOSIS — H524 Presbyopia: Secondary | ICD-10-CM | POA: Diagnosis not present

## 2020-03-07 DIAGNOSIS — H04123 Dry eye syndrome of bilateral lacrimal glands: Secondary | ICD-10-CM | POA: Diagnosis not present

## 2020-03-17 ENCOUNTER — Other Ambulatory Visit (HOSPITAL_COMMUNITY): Payer: Self-pay | Admitting: Internal Medicine

## 2020-03-17 DIAGNOSIS — R29898 Other symptoms and signs involving the musculoskeletal system: Secondary | ICD-10-CM

## 2020-03-20 ENCOUNTER — Other Ambulatory Visit: Payer: Self-pay

## 2020-03-20 ENCOUNTER — Ambulatory Visit (HOSPITAL_COMMUNITY)
Admission: RE | Admit: 2020-03-20 | Discharge: 2020-03-20 | Disposition: A | Discharge status: Home / Self Care | Payer: Medicare Other | Source: Ambulatory Visit | Attending: Internal Medicine | Admitting: Internal Medicine

## 2020-03-20 DIAGNOSIS — R29898 Other symptoms and signs involving the musculoskeletal system: Secondary | ICD-10-CM | POA: Insufficient documentation

## 2020-03-22 DIAGNOSIS — M542 Cervicalgia: Secondary | ICD-10-CM | POA: Diagnosis not present

## 2020-03-28 ENCOUNTER — Ambulatory Visit
Admission: RE | Admit: 2020-03-28 | Discharge: 2020-03-28 | Disposition: A | Discharge status: Home / Self Care | Payer: Medicare Other | Source: Ambulatory Visit | Attending: Internal Medicine | Admitting: Internal Medicine

## 2020-03-28 DIAGNOSIS — D3500 Benign neoplasm of unspecified adrenal gland: Secondary | ICD-10-CM | POA: Diagnosis not present

## 2020-03-28 DIAGNOSIS — J439 Emphysema, unspecified: Secondary | ICD-10-CM | POA: Diagnosis not present

## 2020-03-28 DIAGNOSIS — F17210 Nicotine dependence, cigarettes, uncomplicated: Secondary | ICD-10-CM

## 2020-03-28 DIAGNOSIS — I712 Thoracic aortic aneurysm, without rupture: Secondary | ICD-10-CM | POA: Diagnosis not present

## 2020-03-28 DIAGNOSIS — Z87891 Personal history of nicotine dependence: Secondary | ICD-10-CM | POA: Diagnosis not present

## 2020-03-28 DIAGNOSIS — Z129 Encounter for screening for malignant neoplasm, site unspecified: Secondary | ICD-10-CM

## 2020-03-28 NOTE — Progress Notes (Signed)
Please call patient and let them  know their  low dose Ct was read as a Lung RADS 2: nodules that are benign in appearance and behavior with a very low likelihood of becoming a clinically active cancer due to size or lack of growth. Recommendation per radiology is for a repeat LDCT in 12 months. .Please let them  know we will order and schedule their  annual screening scan for 05/2020. Please let them  know there was notation of CAD on their  scan.  Please remind the patient  that this is a non-gated exam therefore degree or severity of disease  cannot be determined. Please have them  follow up with their PCP regarding potential risk factor modification, dietary therapy or pharmacologic therapy if clinically indicated. Pt.  is  currently on statin therapy. Please place order for annual  screening scan for  03/2021 and fax results to PCP. Thanks so much.  Denise, radiology is recommending he gets every 6 month scans to also assess his AAA. Can you ask huim if he is followed by Thoracic Surgery? If not can you write to refer him? Thanks so much

## 2020-04-04 ENCOUNTER — Telehealth: Payer: Self-pay | Admitting: Acute Care

## 2020-04-04 DIAGNOSIS — Z87891 Personal history of nicotine dependence: Secondary | ICD-10-CM

## 2020-04-04 DIAGNOSIS — F17210 Nicotine dependence, cigarettes, uncomplicated: Secondary | ICD-10-CM

## 2020-04-04 NOTE — Telephone Encounter (Signed)
Pt informed of CT results per Eric Form, NP.  PT verbalized understanding.  Copy sent to PCP.  Order placed for 1 yr f/u CT. Pt sees Dr Skeet Latch for AAA f/u.

## 2020-04-05 DIAGNOSIS — Z85828 Personal history of other malignant neoplasm of skin: Secondary | ICD-10-CM | POA: Diagnosis not present

## 2020-04-05 DIAGNOSIS — L218 Other seborrheic dermatitis: Secondary | ICD-10-CM | POA: Diagnosis not present

## 2020-04-05 DIAGNOSIS — L905 Scar conditions and fibrosis of skin: Secondary | ICD-10-CM | POA: Diagnosis not present

## 2020-04-05 DIAGNOSIS — L819 Disorder of pigmentation, unspecified: Secondary | ICD-10-CM | POA: Diagnosis not present

## 2020-04-05 DIAGNOSIS — L570 Actinic keratosis: Secondary | ICD-10-CM | POA: Diagnosis not present

## 2020-04-18 DIAGNOSIS — C069 Malignant neoplasm of mouth, unspecified: Secondary | ICD-10-CM | POA: Diagnosis not present

## 2020-04-18 DIAGNOSIS — K1370 Unspecified lesions of oral mucosa: Secondary | ICD-10-CM | POA: Diagnosis not present

## 2020-04-24 ENCOUNTER — Other Ambulatory Visit: Payer: Self-pay | Admitting: Cardiovascular Disease

## 2020-04-24 ENCOUNTER — Ambulatory Visit
Admission: RE | Admit: 2020-04-24 | Discharge: 2020-04-24 | Disposition: A | Discharge status: Home / Self Care | Payer: Medicare Other | Source: Ambulatory Visit | Attending: Cardiovascular Disease | Admitting: Cardiovascular Disease

## 2020-04-24 DIAGNOSIS — I712 Thoracic aortic aneurysm, without rupture, unspecified: Secondary | ICD-10-CM

## 2020-04-24 DIAGNOSIS — I714 Abdominal aortic aneurysm, without rupture: Secondary | ICD-10-CM | POA: Diagnosis not present

## 2020-04-24 DIAGNOSIS — I7121 Aneurysm of the ascending aorta, without rupture: Secondary | ICD-10-CM

## 2020-04-24 DIAGNOSIS — I2510 Atherosclerotic heart disease of native coronary artery without angina pectoris: Secondary | ICD-10-CM | POA: Diagnosis not present

## 2020-04-24 DIAGNOSIS — M4316 Spondylolisthesis, lumbar region: Secondary | ICD-10-CM | POA: Diagnosis not present

## 2020-04-24 DIAGNOSIS — J432 Centrilobular emphysema: Secondary | ICD-10-CM | POA: Diagnosis not present

## 2020-04-24 DIAGNOSIS — I701 Atherosclerosis of renal artery: Secondary | ICD-10-CM | POA: Diagnosis not present

## 2020-04-24 MED ORDER — IOPAMIDOL (ISOVUE-370) INJECTION 76%
75.0000 mL | Freq: Once | INTRAVENOUS | Status: AC | PRN
  Administered 2020-04-24: 75 mL via INTRAVENOUS

## 2020-05-03 ENCOUNTER — Ambulatory Visit (HOSPITAL_COMMUNITY): Payer: Medicare Other | Attending: Cardiovascular Disease

## 2020-05-03 ENCOUNTER — Other Ambulatory Visit: Payer: Self-pay

## 2020-05-03 DIAGNOSIS — I7121 Aneurysm of the ascending aorta, without rupture: Secondary | ICD-10-CM

## 2020-05-03 DIAGNOSIS — I712 Thoracic aortic aneurysm, without rupture: Secondary | ICD-10-CM | POA: Diagnosis not present

## 2020-05-03 LAB — ECHOCARDIOGRAM COMPLETE
AR max vel: 1.02 cm2
AV Area VTI: 1.15 cm2
AV Area mean vel: 0.93 cm2
AV Mean grad: 19 mmHg
AV Peak grad: 29.8 mmHg
Ao pk vel: 2.73 m/s
Area-P 1/2: 2.41 cm2
MV M vel: 5.16 m/s
MV Peak grad: 106.6 mmHg
P 1/2 time: 501 msec
S' Lateral: 2.4 cm

## 2020-05-08 ENCOUNTER — Telehealth: Payer: Self-pay | Admitting: *Deleted

## 2020-05-08 DIAGNOSIS — I7121 Aneurysm of the ascending aorta, without rupture: Secondary | ICD-10-CM

## 2020-05-08 DIAGNOSIS — I350 Nonrheumatic aortic (valve) stenosis: Secondary | ICD-10-CM

## 2020-05-08 DIAGNOSIS — I712 Thoracic aortic aneurysm, without rupture: Secondary | ICD-10-CM

## 2020-05-08 NOTE — Telephone Encounter (Signed)
Released in my chart with Dr Blenda Mounts comments attached and future orders placed

## 2020-05-08 NOTE — Telephone Encounter (Signed)
-----   Message from Skeet Latch, MD sent at 05/06/2020  9:37 AM EST ----- The aneurysm is moderately enlarged similar to prior.  Recommend repeating CT-A in 1 year.  A stable liver cyst and emphysema were also noted.

## 2020-05-08 NOTE — Telephone Encounter (Signed)
-----   Message from Skeet Latch, MD sent at 05/06/2020  9:40 AM EST ----- His heart is squeezing normally.  Mild leaking of the mitral and aortic valves.  There is moderate aortic stenosis.  There is nothing to do about this.  We just need to keep monitoring.  Repeat echo in one year.

## 2020-06-28 ENCOUNTER — Encounter: Payer: Self-pay | Admitting: Cardiovascular Disease

## 2020-06-28 ENCOUNTER — Other Ambulatory Visit: Payer: Self-pay | Admitting: Internal Medicine

## 2020-06-28 ENCOUNTER — Ambulatory Visit (INDEPENDENT_AMBULATORY_CARE_PROVIDER_SITE_OTHER): Payer: Medicare Other | Admitting: Cardiovascular Disease

## 2020-06-28 ENCOUNTER — Other Ambulatory Visit: Payer: Self-pay

## 2020-06-28 VITALS — BP 112/68 | HR 84 | Ht 69.0 in | Wt 188.6 lb

## 2020-06-28 DIAGNOSIS — I2510 Atherosclerotic heart disease of native coronary artery without angina pectoris: Secondary | ICD-10-CM | POA: Diagnosis not present

## 2020-06-28 DIAGNOSIS — I350 Nonrheumatic aortic (valve) stenosis: Secondary | ICD-10-CM

## 2020-06-28 DIAGNOSIS — I712 Thoracic aortic aneurysm, without rupture, unspecified: Secondary | ICD-10-CM

## 2020-06-28 DIAGNOSIS — I6522 Occlusion and stenosis of left carotid artery: Secondary | ICD-10-CM

## 2020-06-28 DIAGNOSIS — I10 Essential (primary) hypertension: Secondary | ICD-10-CM | POA: Diagnosis not present

## 2020-06-28 NOTE — Patient Instructions (Addendum)
Medication Instructions:  Your physician recommends that you continue on your current medications as directed. Please refer to the Current Medication list given to you today.  *If you need a refill on your cardiac medications before your next appointment, please call your pharmacy*  Lab Work: BMET 1 WEEK PRIOR TO CT   If you have labs (blood work) drawn today and your tests are completely normal, you will receive your results only by: Marland Kitchen MyChart Message (if you have MyChart) OR . A paper copy in the mail If you have any lab test that is abnormal or we need to change your treatment, we will call you to review the results.  Testing/Procedures: CTA CHEST, ABDOMEN, AND PELVIS IN 1 YEAR   Follow-Up: At [facility], you and your health needs are our priority.  As part of our continuing mission to provide you with exceptional heart care, we have created designated Provider Care Teams.  These Care Teams include your primary Cardiologist (physician) and Advanced Practice Providers (APPs -  Physician Assistants and Nurse Practitioners) who all work together to provide you with the care you need, when you need it.  We recommend signing up for the patient portal called "MyChart".  Sign up information is provided on this After Visit Summary.  MyChart is used to connect with patients for Virtual Visits (Telemedicine).  Patients are able to view lab/test results, encounter notes, upcoming appointments, etc.  Non-urgent messages can be sent to your provider as well.   To learn more about what you can do with MyChart, go to [email or web address].    Your next appointment:   12 month(s)  The format for your next appointment:   In Person  Provider:   You may see DR [facility] AFTER CT   or one of the following Advanced Practice Providers on your designated Care Team:    Kerin Ransom, PA-C  [place], [place]  Coletta Memos, [place]

## 2020-06-28 NOTE — Progress Notes (Signed)
Cardiology Office Note   Date:  06/28/2020   ID:  [name], [name] 08-14-1940, MRN [id number]  PCP:  Shon Baton, MD  Cardiologist:   Skeet Latch, MD   No chief complaint on file.    History of Present Illness: [name] is a 80 y.o. male withasymptomatic coronary calcification, mild aortic stenosis, ascending aortic aneurysm, carotid stenosis status post right CEA,hypertension,emphysema,prior PE,multiple skin cancers,and GI bleedhere for follow up. He was initially seen 03/2018 for the evaluation ofcoronary calcification.  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 This occurred in the setting of LE DVT. He had a follow up chest CT 03/16/18 that revealed atherosclerosis of the aorta and all three coronary vessels including the left main. 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 He was feeling well but didn't get much formal exercise so he was referred for a Lexiscan Myoview 04/2018 that revealed no ischemia.  TID ratio was 1.25.  He was also noted to have a systolic murmur and had an echo that revealed LVEF 64-40%, grade 1 diastolic dysfunction and mild aortic stenosis (mean gradient 14 mmHg).  Repeat echo 04/2019 the mean gradient was 18 mmHg and 19 mmHg 04/2020.  The ascending aorta was 4.6 cm.  Lately has been feeling well.  His only complaint is that his legs feel fatigued.  He saw [facility] and they did testing that revealed normal circulation. He wonders if it is due to arthritis.  He has no exertional chest pain or shortness of breath.  He feels like there is a buildup of lactic acid.  He sees orthopedics in a couple weeks.  He hasn't been able to dive due to COVID-19.  He remains active but isn't getting much formal exercise.  He continues to smoke about 5 cigars daily and isn't interested in quitting. He has some mild swelling in his legs but does not use Lasix. He denies orthopnea or PND.   Past Medical History:  Diagnosis Date  .  Anxiety    reports scotch helps that   . Aortic stenosis, mild 08/18/2018   Mean gradient 14 mmHg 04/2018  . Arthritis    " it's everywhere"  . Blood transfusion without reported diagnosis   . Cataract    having cataract surgery July 2016  . Chronic bronchitis ([place])   . Chronic kidney disease    hx kidney stones  . Colon polyps   . Complication of anesthesia    after surgery 07-2013 unable to sleep for 3 days  . Coronary artery calcification seen on CAT scan 08/18/2018  . GERD (gastroesophageal reflux disease)   . History of kidney stones   . Hyperlipidemia   . Hypertension   . Prostate hypertrophy   . Pulmonary embolism ([place]) 12/2014   post TJR  . Seasonal allergies   . 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 Sleep apnea    had surgery in 2000 for sleep apnea, no CPAP needed  . Thoracic aortic aneurysm ([place]) 05/04/2019   4.6 cm 04/2019    Past Surgical History:  Procedure Laterality Date  . CAROTID ENDARTERECTOMY Right Oct 01, 2013   CE  . COLONOSCOPY    . ENDARTERECTOMY Right 10/01/2013   Procedure: ENDARTERECTOMY CAROTID-RIGHT;  Surgeon: Rosetta Posner, MD;  Location: [place];  Service: Vascular;  Laterality: Right;  . ESOPHAGOGASTRODUODENOSCOPY ENDOSCOPY  10-12-2013  . EXCISION ORAL LESION WITH CO2 LASER N/A 09/08/2015   Procedure: EXCISION ORAL LESION WITH CO2 LASER;  Surgeon: Jerrell Belfast, MD;  Location: [place];  Service: ENT;  Laterality: N/A;  . EYE SURGERY Bilateral    cataracts, W/IOL  . JOINT REPLACEMENT Right September 02, 2014   Knee  . JOINT REPLACEMENT Left 2012   Knee  . PATCH ANGIOPLASTY Right 10/01/2013   Procedure: PATCH ANGIOPLASTY USING HEMASHIELD FINESSE PATCH;  Surgeon: Rosetta Posner, MD;  Location: [place];  Service: Vascular;  Laterality: Right;  . removal of melonomia Left    shoulder  . REPLACEMENT TOTAL KNEE Left   . REVERSE SHOULDER ARTHROPLASTY Right 10/11/2016    Procedure: REVERSE RIGHT SHOULDER ARTHROPLASTY;  Surgeon: Netta Cedars, MD;  Location: [place];  Service: Orthopedics;  Laterality: Right;  . REVERSE SHOULDER ARTHROPLASTY Left 12/03/2019   Procedure: REVERSE SHOULDER ARTHROPLASTY;  Surgeon: Netta Cedars, MD;  Location: [facility];  Service: Orthopedics;  Laterality: Left;  interscalene block  . RHINOPLASTY     1999  . RHINOPLASTY    . SKIN CANCER EXCISION     squamous cell cancer under tongue  . SKIN SURGERY     squamous cell carcinoma removed in various places,melanoma removed from L shoulder  . sleep apnea surgery  2000  . STERIOD INJECTION Left 10/11/2016   Procedure: STEROID INJECTION LEFT SHOULDER;  Surgeon: Netta Cedars, MD;  Location: [place];  Service: Orthopedics;  Laterality: Left;  . TOTAL HIP ARTHROPLASTY Left 10/01/2017   Procedure: LEFT TOTAL HIP ARTHROPLASTY ANTERIOR APPROACH;  Surgeon: Gaynelle Arabian, MD;  Location: [facility];  Service: Orthopedics;  Laterality: Left;  . TOTAL KNEE ARTHROPLASTY Right 09/02/2014   Procedure: RIGHT TOTAL KNEE ARTHROPLASTY;  Surgeon: Netta Cedars, MD;  Location: [place];  Service: Orthopedics;  Laterality: Right;     Current Outpatient Medications  Medication Sig Dispense Refill  . apixaban (ELIQUIS) 5 MG TABS tablet Take 5 mg by mouth 2 (two) times daily.     . B Complex-C (B-COMPLEX WITH VITAMIN C) tablet Take 1 tablet by mouth daily.    . cholecalciferol (VITAMIN D) 1000 units tablet Take 1,000 Units by mouth daily.    Marland Kitchen esomeprazole (NEXIUM) 40 MG capsule Take 40 mg by mouth daily as needed (acid reflux).    . fenofibrate (TRICOR) 145 MG tablet Take 145 mg by mouth daily before breakfast.     . finasteride (PROSCAR) 5 MG tablet Take 5 mg by mouth daily.    . fluticasone (FLONASE) 50 MCG/ACT nasal spray Place 2 sprays into both nostrils daily as needed for allergies or rhinitis.    . Fluticasone-Salmeterol (ADVAIR) 100-50 MCG/DOSE AEPB Inhale 1 puff into the lungs 2 (two) times daily as needed (for  respiratory issues.).    Marland Kitchen furosemide (LASIX) 20 MG tablet As needed for swelling 30 tablet 3  . losartan-hydrochlorothiazide (HYZAAR) 100-12.5 MG tablet Take 1 tablet by mouth daily.    . Magnesium 100 MG TABS Take 100 mg by mouth daily.     . methocarbamol (ROBAXIN) 500 MG tablet Take 1 tablet (500 mg total) by mouth every 8 (eight) hours as needed for muscle spasms. 40 tablet 1  . NON FORMULARY Take 1,000 mg by mouth daily. CBD Oil    . Omega-3 Fatty Acids (FISH OIL) 1000 MG CAPS Take 1,000 mg by mouth daily.    Marland Kitchen oxyCODONE-acetaminophen (PERCOCET) 5-325 MG tablet Take 1 tablet by mouth every 4 (four) hours as  needed for severe pain. 30 tablet 0  . predniSONE (DELTASONE) 10 MG tablet Take 4tabsx2days, 2tabsx2days, 1tabx2days, 0.5tabx2days, then stop 15 tablet 0  . probenecid (BENEMID) 500 MG tablet Take 500 mg by mouth daily before breakfast.     . rosuvastatin (CRESTOR) 10 MG tablet Take 10 mg by mouth daily.    . sildenafil (VIAGRA) 25 MG tablet Take 25 mg by mouth daily as needed for erectile dysfunction.    . tamsulosin (FLOMAX) 0.4 MG CAPS capsule Take 0.4 mg by mouth daily.      No current facility-administered medications for this visit.    Allergies:   Cefaclor and Penicillins    Social History:  The patient  reports that he has been smoking cigars. He has smoked for the past 50.00 years. He has never used smokeless tobacco. He reports current alcohol use of about 21.0 standard drinks of alcohol per week. He reports that he does not use drugs.   Family History:  The patient's family history includes Diverticulosis in his mother; Heart attack in his father and mother; Heart disease in his father; Hyperlipidemia in his father; Skin cancer in his mother; Varicose Veins in his mother.    ROS:  Please see the history of present illness.   Otherwise, review of systems are positive for none.   All other systems are reviewed and negative.    PHYSICAL EXAM: VS:  BP 112/68   Pulse 84    Ht 5\' 9"  (1.753 m)   Wt 188 lb 9.6 oz (85.5 kg)   SpO2 98%   BMI 27.85 kg/m  , BMI Body mass index is 27.85 kg/m. GENERAL:  Well appearing HEENT: Pupils equal round and reactive, fundi not visualized, oral mucosa unremarkable NECK:  No jugular venous distention, waveform within normal limits, carotid upstroke brisk and symmetric, no bruits LUNGS:  Clear to auscultation bilaterally HEART:  RRR.  PMI not displaced or sustained,S1 and S2 within normal limits, no S3, no S4, no clicks, no rubs, no murmurs ABD:  Flat, positive bowel sounds normal in frequency in pitch, no bruits, no rebound, no guarding, no midline pulsatile mass, no hepatomegaly, no splenomegaly EXT:  2 plus pulses throughout, no edema, no cyanosis no clubbing SKIN:  No rashes no nodules NEURO:  Cranial nerves II through XII grossly intact, motor grossly intact throughout PSYCH:  Cognitively intact, oriented to person place and time  EKG:  EKG is not ordered today. The ekg ordered 05/04/19 demonstrates sinus rhythm.  Rate 83 bpm. 12/27/19: Sinus rhythm.  Rate 91 bpm.  PAC with aberrant conduction  Lexiscan Myoview 04/20/18:  Nuclear stress EF: 50%.  There was no ST segment deviation noted during stress.  No T wave inversion was noted during stress.  The study is normal.  This is a low risk study.  The left ventricular ejection fraction is mildly decreased (45-54%). Abnormal TID ratio of 1.25 indicates a possibility of balanced ischemia. Though the specificity of this finding is limited in a pharmacologic stress test compared with an exercise stress test.  Echo 04/20/18: Study Conclusions  - Left ventricle: The cavity size was normal. Wall thickness was increased in a pattern of mild LVH. Systolic function was normal. The estimated ejection fraction was in the range of 60% to 65%. Doppler parameters are consistent with abnormal left ventricular relaxation (grade 1 diastolic dysfunction). - Aortic valve:  Trileaflet; severely calcified leaflets. There was mild to moderate stenosis. Mean gradient (S): 14 mm Hg. Valve area (VTI): 1.49  cm^2. - Ascending aorta: The ascending aorta was mildly dilated to 40 mm. - Mitral valve: Moderately calcified annulus. There was trivial regurgitation. - Right ventricle: The cavity size was normal. Systolic function was normal. - Pulmonary arteries: No complete TR doppler jet so unable to estimate PA systolic pressure. - Inferior vena cava: The vessel was normal in size. The respirophasic diameter changes were in the normal range (= 50%), consistent with normal central venous pressure.  Impressions:  - Normal LV size with mild LV hypertrophy. EF 60-65%. Normal RV size and systolic function. Mild to moderate AS with mean gradient 14 mmHg and AVA 1.49 cm^2.  Echo 04/2020: 1. Left ventricular ejection fraction, by estimation, is 60 to 65%. The  left ventricle has normal function. The left ventricle has no regional  wall motion abnormalities. There is moderate left ventricular hypertrophy.  Left ventricular diastolic  parameters were normal.  2. Right ventricular systolic function is normal. The right ventricular  size is normal.  3. Left atrial size was moderately dilated.  4. The mitral valve is degenerative. Mild mitral valve regurgitation. No  evidence of mitral stenosis. Moderate mitral annular calcification.  5. The aortic valve is tricuspid. There is moderate calcification of the  aortic valve. There is moderate thickening of the aortic valve. Aortic  valve regurgitation is mild. Moderate aortic valve stenosis.  6. Aortic dilatation noted. There is mild dilatation of the ascending  aorta, measuring 40 mm.  7. The inferior vena cava is normal in size with greater than 50%  respiratory variability, suggesting right atrial pressure of 3 mmHg.    Recent Labs: 11/25/2019: Platelets 153 12/04/2019: BUN 25; Creatinine, Ser  1.14; Hemoglobin 14.1; Potassium 4.6; Sodium 136    Lipid Panel No results found for: CHOL, TRIG, HDL, CHOLHDL, VLDL, LDLCALC, LDLDIRECT    Wt Readings from Last 3 Encounters:  06/28/20 188 lb 9.6 oz (85.5 kg)  03/01/20 184 lb 12.8 oz (83.8 kg)  12/27/19 184 lb (83.5 kg)      ASSESSMENT AND PLAN:  # Coronary calcifications: # Hyperlipidemia:  Asymptomatic.  Stress test was negative 04/2018.  We continue the importance stressed the importance of diet and exercise.  LDL was 50 on 12/2019. Continue with a statin and fenofibrate.  # Ascending aorta aneurysm: # Descending aorta aneurysm:   Ascending aorta 4.4 cm on 04/2020. He also has an infrarenal aortic aneurysm at 2 0.9 mm. These are stable from prior. Repeat CT-[facility] in 1 year. Continue blood pressure control.  # Tobacco: Patient is not interested in stopping his cigars. Cessation was again advised.  # Hypertension:Blood pressure remains well-controlled on losartan/HCTZ. Consider adding a beta-blocker given his aneurysms.  # Mild-moderate aortic stenosis: Mean gradient was 18 mmHg on echo 04/2019 and 19 mmHg 04/2020. We will recheck in 2 years.  # Leg weakness:  Not vascular.  ABIs normal in 2019.  He has known lumbar disc disease. He has follow-up with orthopedics in a couple weeks.    Current medicines are reviewed at length with the patient today.  The patient does not have concerns regarding medicines.  The following changes have been made:  no change  Labs/ tests ordered today include:   No orders of the defined types were placed in this encounter.    Disposition:   FU with Marsh Heckler C. Oval Linsey, MD, [facility] in 1 year    Signed, Indra Wolters C. Oval Linsey, MD, [facility]  06/28/2020 10:31 AM    [facility]

## 2020-06-30 DIAGNOSIS — N1831 Chronic kidney disease, stage 3a: Secondary | ICD-10-CM | POA: Diagnosis not present

## 2020-06-30 DIAGNOSIS — I2510 Atherosclerotic heart disease of native coronary artery without angina pectoris: Secondary | ICD-10-CM | POA: Diagnosis not present

## 2020-06-30 DIAGNOSIS — I700 Atherosclerosis of aorta: Secondary | ICD-10-CM | POA: Diagnosis not present

## 2020-06-30 DIAGNOSIS — R739 Hyperglycemia, unspecified: Secondary | ICD-10-CM | POA: Diagnosis not present

## 2020-06-30 DIAGNOSIS — I739 Peripheral vascular disease, unspecified: Secondary | ICD-10-CM | POA: Diagnosis not present

## 2020-06-30 DIAGNOSIS — J439 Emphysema, unspecified: Secondary | ICD-10-CM | POA: Diagnosis not present

## 2020-06-30 DIAGNOSIS — I350 Nonrheumatic aortic (valve) stenosis: Secondary | ICD-10-CM | POA: Diagnosis not present

## 2020-06-30 DIAGNOSIS — E785 Hyperlipidemia, unspecified: Secondary | ICD-10-CM | POA: Diagnosis not present

## 2020-06-30 DIAGNOSIS — I1310 Hypertensive heart and chronic kidney disease without heart failure, with stage 1 through stage 4 chronic kidney disease, or unspecified chronic kidney disease: Secondary | ICD-10-CM | POA: Diagnosis not present

## 2020-06-30 DIAGNOSIS — Z7901 Long term (current) use of anticoagulants: Secondary | ICD-10-CM | POA: Diagnosis not present

## 2020-06-30 DIAGNOSIS — I712 Thoracic aortic aneurysm, without rupture: Secondary | ICD-10-CM | POA: Diagnosis not present

## 2020-06-30 DIAGNOSIS — F17290 Nicotine dependence, other tobacco product, uncomplicated: Secondary | ICD-10-CM | POA: Diagnosis not present

## 2020-07-03 ENCOUNTER — Other Ambulatory Visit: Payer: Self-pay | Admitting: *Deleted

## 2020-07-03 DIAGNOSIS — I6523 Occlusion and stenosis of bilateral carotid arteries: Secondary | ICD-10-CM

## 2020-07-05 DIAGNOSIS — L905 Scar conditions and fibrosis of skin: Secondary | ICD-10-CM | POA: Diagnosis not present

## 2020-07-05 DIAGNOSIS — L570 Actinic keratosis: Secondary | ICD-10-CM | POA: Diagnosis not present

## 2020-07-05 DIAGNOSIS — C4442 Squamous cell carcinoma of skin of scalp and neck: Secondary | ICD-10-CM | POA: Diagnosis not present

## 2020-07-05 DIAGNOSIS — Z85828 Personal history of other malignant neoplasm of skin: Secondary | ICD-10-CM | POA: Diagnosis not present

## 2020-07-05 DIAGNOSIS — D485 Neoplasm of uncertain behavior of skin: Secondary | ICD-10-CM | POA: Diagnosis not present

## 2020-07-06 DIAGNOSIS — N401 Enlarged prostate with lower urinary tract symptoms: Secondary | ICD-10-CM | POA: Diagnosis not present

## 2020-07-08 DIAGNOSIS — H34231 Retinal artery branch occlusion, right eye: Secondary | ICD-10-CM | POA: Diagnosis not present

## 2020-07-10 ENCOUNTER — Ambulatory Visit (HOSPITAL_COMMUNITY)
Admission: RE | Admit: 2020-07-10 | Discharge: 2020-07-10 | Disposition: A | Discharge status: Home / Self Care | Payer: Medicare Other | Source: Ambulatory Visit | Attending: Vascular Surgery | Admitting: Vascular Surgery

## 2020-07-10 ENCOUNTER — Telehealth: Payer: Self-pay

## 2020-07-10 ENCOUNTER — Other Ambulatory Visit: Payer: Self-pay | Admitting: *Deleted

## 2020-07-10 ENCOUNTER — Other Ambulatory Visit: Payer: Self-pay

## 2020-07-10 DIAGNOSIS — I6523 Occlusion and stenosis of bilateral carotid arteries: Secondary | ICD-10-CM | POA: Diagnosis not present

## 2020-07-10 DIAGNOSIS — H349 Unspecified retinal vascular occlusion: Secondary | ICD-10-CM | POA: Diagnosis not present

## 2020-07-10 NOTE — Telephone Encounter (Signed)
Angie from [facility] called to get pt in STAT due to R retinal artery occlusion. We have scheduled pt for bilateral carotid u/s today and to see MD tomorrow morning. Both Angie and the pt are aware of these scheduled appts.

## 2020-07-11 ENCOUNTER — Encounter: Payer: Self-pay | Admitting: Vascular Surgery

## 2020-07-11 ENCOUNTER — Ambulatory Visit (INDEPENDENT_AMBULATORY_CARE_PROVIDER_SITE_OTHER): Payer: Medicare Other | Admitting: Vascular Surgery

## 2020-07-11 VITALS — BP 106/69 | HR 84 | Temp 98.0°F | Resp 20 | Ht 69.0 in | Wt 187.0 lb

## 2020-07-11 DIAGNOSIS — I6523 Occlusion and stenosis of bilateral carotid arteries: Secondary | ICD-10-CM

## 2020-07-11 NOTE — Progress Notes (Signed)
ASSESSMENT & PLAN:  80 y.o. male with mild bilateral carotid artery stenosis status post right carotid endarterectomy. I do not think this is the cause of his retinal artery occlusion.   Recommend the following which can slow the progression of atherosclerosis and reduce the risk of major adverse cardiac / limb events:  Complete cessation from all tobacco products. Blood glucose control with goal A1c < 7%. Blood pressure control with goal blood pressure < 140/90 mmHg. Lipid reduction therapy with goal LDL-C <100 mg/dL (<70 if symptomatic from PAD).  Eliquis 2.5mg  PO BID Consider increasing Crestor dose to 20mg  to achieve "high intensity" dose  Follow up with me in 1 year to continue surveillance of carotid stenosis.  CHIEF COMPLAINT:   Right retinal artery occlusion  HISTORY:  HISTORY OF PRESENT ILLNESS: [name] is a 80 y.o. male status post right carotid endarterectomy 10/01/2013 with Dr. Donnetta Hutching for severe asymptomatic disease who presents to the clinic for evaluation of right retinal artery occlusion.  He underwent a carotid duplex yesterday which shows mild bilateral disease.  Aside from the visual disturbance he has no focal symptoms.  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  He continues to smoke cigars as he has done most of his adult life.  Past Medical History:  Diagnosis Date  . Anxiety    reports scotch helps that   . Aortic stenosis, mild 08/18/2018   Mean gradient 14 mmHg 04/2018  . Arthritis    " it's everywhere"  . Blood transfusion without reported diagnosis   . Cataract    having cataract surgery July 2016  . Chronic bronchitis ([place])   . Chronic kidney disease    hx kidney stones  . Colon polyps   . Complication of anesthesia    after surgery 07-2013 unable to sleep for 3 days  . Coronary artery calcification seen on CAT scan 08/18/2018  . GERD (gastroesophageal reflux disease)   . History of kidney stones    . Hyperlipidemia   . Hypertension   . Prostate hypertrophy   . Pulmonary embolism ([place]) 12/2014   post TJR  . Seasonal allergies   . 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 Sleep apnea    had surgery in 2000 for sleep apnea, no CPAP needed  . Thoracic aortic aneurysm ([place]) 05/04/2019   4.6 cm 04/2019    Past Surgical History:  Procedure Laterality Date  . CAROTID ENDARTERECTOMY Right Oct 01, 2013   CE  . COLONOSCOPY    . ENDARTERECTOMY Right 10/01/2013   Procedure: ENDARTERECTOMY CAROTID-RIGHT;  Surgeon: Rosetta Posner, MD;  Location: [facility];  Service: Vascular;  Laterality: Right;  . ESOPHAGOGASTRODUODENOSCOPY ENDOSCOPY  10-12-2013  . EXCISION ORAL LESION WITH CO2 LASER N/A 09/08/2015   Procedure: EXCISION ORAL LESION WITH CO2 LASER;  Surgeon: Jerrell Belfast, MD;  Location: [facility];  Service: ENT;  Laterality: N/A;  . EYE SURGERY Bilateral    cataracts, W/IOL  . JOINT REPLACEMENT Right September 02, 2014   Knee  . JOINT REPLACEMENT Left 2012   Knee  . PATCH ANGIOPLASTY Right 10/01/2013   Procedure: PATCH ANGIOPLASTY USING HEMASHIELD FINESSE PATCH;  Surgeon: Rosetta Posner, MD;  Location: [facility];  Service: Vascular;  Laterality: Right;  . removal of melonomia Left    shoulder  . REPLACEMENT TOTAL  KNEE Left   . REVERSE SHOULDER ARTHROPLASTY Right 10/11/2016   Procedure: REVERSE RIGHT SHOULDER ARTHROPLASTY;  Surgeon: Netta Cedars, MD;  Location: [facility];  Service: Orthopedics;  Laterality: Right;  . REVERSE SHOULDER ARTHROPLASTY Left 12/03/2019   Procedure: REVERSE SHOULDER ARTHROPLASTY;  Surgeon: Netta Cedars, MD;  Location: [facility];  Service: Orthopedics;  Laterality: Left;  interscalene block  . RHINOPLASTY     1999  . RHINOPLASTY    . SKIN CANCER EXCISION     squamous cell cancer under tongue  . SKIN SURGERY     squamous cell carcinoma removed in various places,melanoma removed from L shoulder  . sleep  apnea surgery  2000  . STERIOD INJECTION Left 10/11/2016   Procedure: STEROID INJECTION LEFT SHOULDER;  Surgeon: Netta Cedars, MD;  Location: [facility];  Service: Orthopedics;  Laterality: Left;  . TOTAL HIP ARTHROPLASTY Left 10/01/2017   Procedure: LEFT TOTAL HIP ARTHROPLASTY ANTERIOR APPROACH;  Surgeon: Gaynelle Arabian, MD;  Location[facility];  Service: Orthopedics;  Laterality: Left;  . TOTAL KNEE ARTHROPLASTY Right 09/02/2014   Procedure: RIGHT TOTAL KNEE ARTHROPLASTY;  Surgeon: Netta Cedars, MD;  Location: [facility];  Service: Orthopedics;  Laterality: Right;    Family History  Problem Relation Age of Onset  . Diverticulosis Mother   . Skin cancer Mother   . Varicose Veins Mother   . Heart attack Mother   . Hyperlipidemia Father   . Heart disease Father   . Heart attack Father   . Colon cancer Neg Hx   . Esophageal cancer Neg Hx   . Rectal cancer Neg Hx   . Stomach cancer Neg Hx     Social History   Socioeconomic History  . Marital status: Divorced    Spouse name: Not on file  . Number of children: 2  . Years of education: Not on file  . Highest education level: Not on file  Occupational History  . Occupation: Theme park manager: [employer]  Tobacco Use  . Smoking status: Light Tobacco Smoker    Years: 50.00    Types: Cigars  . Smokeless tobacco: Never Used  . Tobacco comment: pt states he smokes a couple of cigars per day. Never smoke cigarettes  Vaping Use  . Vaping Use: Never used  Substance and Sexual Activity  . Alcohol use: Yes    Alcohol/week: 21.0 standard drinks    Types: 7 Glasses of wine, 14 Shots of liquor per week    Comment: weekly  . Drug use: No  . Sexual activity: Not on file  Other Topics Concern  . Not on file  Social History Narrative  . Not on file   Social Determinants of Health   Financial Resource Strain: Not on file  Food Insecurity: Not on file  Transportation Needs: Not on file  Physical Activity: Not on file   Stress: Not on file  Social Connections: Not on file  Intimate Partner Violence: Not on file    Allergies  Allergen Reactions  . Cefaclor Other (See Comments)     skin split  . Penicillins Rash    Has patient had a PCN reaction causing immediate rash, facial/tongue/throat swelling, SOB or lightheadedness with hypotension:unsure Has patient had a PCN reaction causing severe rash involving mucus membranes or skin necrosis:unsure Has patient had a PCN reaction that required hospitalization:No Has patient had a PCN reaction occurring within the last 10 years:No--childhood allergy If all of the above answers are "NO", then  may proceed with Cephalosporin use.     Current Outpatient Medications  Medication Sig Dispense Refill  . B Complex-C (B-COMPLEX WITH VITAMIN C) tablet Take 1 tablet by mouth daily.    . cholecalciferol (VITAMIN D) 1000 units tablet Take 1,000 Units by mouth daily.    Marland Kitchen ELIQUIS 2.5 MG TABS tablet TAKE ONE TABLET TWICE DAILY 60 tablet 3  . esomeprazole (NEXIUM) 40 MG capsule Take 40 mg by mouth daily as needed (acid reflux).    . fenofibrate (TRICOR) 145 MG tablet Take 145 mg by mouth daily before breakfast.     . finasteride (PROSCAR) 5 MG tablet Take 5 mg by mouth daily.    . fluticasone (FLONASE) 50 MCG/ACT nasal spray Place 2 sprays into both nostrils daily as needed for allergies or rhinitis.    . Fluticasone-Salmeterol (ADVAIR) 100-50 MCG/DOSE AEPB Inhale 1 puff into the lungs 2 (two) times daily as needed (for respiratory issues.).    Marland Kitchen furosemide (LASIX) 20 MG tablet As needed for swelling 30 tablet 3  . losartan-hydrochlorothiazide (HYZAAR) 100-12.5 MG tablet Take 1 tablet by mouth daily.    . Magnesium 100 MG TABS Take 100 mg by mouth daily.     . methocarbamol (ROBAXIN) 500 MG tablet Take 1 tablet (500 mg total) by mouth every 8 (eight) hours as needed for muscle spasms. 40 tablet 1  . NON FORMULARY Take 1,000 mg by mouth daily. CBD Oil    . Omega-3 Fatty  Acids (FISH OIL) 1000 MG CAPS Take 1,000 mg by mouth daily.    Marland Kitchen oxyCODONE-acetaminophen (PERCOCET) 5-325 MG tablet Take 1 tablet by mouth every 4 (four) hours as needed for severe pain. 30 tablet 0  . predniSONE (DELTASONE) 10 MG tablet Take 4tabsx2days, 2tabsx2days, 1tabx2days, 0.5tabx2days, then stop 15 tablet 0  . probenecid (BENEMID) 500 MG tablet Take 500 mg by mouth daily before breakfast.     . rosuvastatin (CRESTOR) 10 MG tablet Take 10 mg by mouth daily.    . sildenafil (VIAGRA) 25 MG tablet Take 25 mg by mouth daily as needed for erectile dysfunction.    . tamsulosin (FLOMAX) 0.4 MG CAPS capsule Take 0.4 mg by mouth daily.      No current facility-administered medications for this visit.    REVIEW OF SYSTEMS:  [X]  denotes positive finding, [ ]  denotes negative finding Cardiac  Comments:  Chest pain or chest pressure:    Shortness of breath upon exertion:    Short of breath when lying flat:    Irregular heart rhythm:        Vascular    Pain in calf, thigh, or hip brought on by ambulation:    Pain in feet at night that wakes you up from your sleep:     Blood clot in your veins: x   Leg swelling:         Pulmonary    Oxygen at home:    Productive cough:     Wheezing:         Neurologic    Sudden weakness in arms or legs:     Sudden numbness in arms or legs:     Sudden onset of difficulty speaking or slurred speech:    Temporary loss of vision in one eye:     Problems with dizziness:         Gastrointestinal    Blood in stool:     Vomited blood:         Genitourinary  Burning when urinating:     Blood in urine:        Psychiatric    Major depression:         Hematologic    Bleeding problems:    Problems with blood clotting too easily:        Skin    Rashes or ulcers:        Constitutional    Fever or chills:     PHYSICAL EXAM:   Vitals:   07/11/20 0905 07/11/20 0907  BP: 103/69 106/69  Pulse: 84   Resp: 20   Temp: 98 F (36.7 C)   SpO2: 95%    Weight: 187 lb (84.8 kg)   Height: 5\' 9"  (1.753 m)    Constitutional: well appearing in no distress. Appears well nourished.  Neurologic: visual disturbance in medial inferior quadrant. CN otherwise intact. No focal findings. No sensory loss. Psychiatric: Mood and affect symmetric and appropriate. Eyes: No icterus. No conjunctival pallor. Ears, nose, throat: mucous membranes moist. Midline trachea.  Cardiac: regular rate and rhythm.  Respiratory: unlabored. Abdominal: soft, non-tender, non-distended.  Extremity: No edema. No cyanosis. No pallor.  Skin: No gangrene. No ulceration.  Lymphatic: No Stemmer's sign. No palpable lymphadenopathy.  DATA REVIEW:    Most recent CBC CBC Latest Ref Rng & Units 12/04/2019 11/25/2019 10/02/2017  WBC 4.0 - 10.5 K/uL - 11.3(H) 13.7(H)  Hemoglobin 13.0 - 17.0 g/dL 14.1 15.4 13.0  Hematocrit 39.0 - 52.0 % 44.1 46.5 41.3  Platelets 150 - 400 K/uL - 153 146(L)     Most recent CMP CMP Latest Ref Rng & Units 12/04/2019 11/25/2019 05/04/2019  Glucose 70 - 99 mg/dL 148(H) 149(H) 81  BUN 8 - 23 mg/dL 25(H) 24(H) 17  Creatinine 0.61 - 1.24 mg/dL 1.14 1.32(H) 1.23  Sodium 135 - 145 mmol/L 136 138 139  Potassium 3.5 - 5.1 mmol/L 4.6 3.7 4.3  Chloride 98 - 111 mmol/L 102 99 100  CO2 22 - 32 mmol/L 27 26 21   Calcium 8.9 - 10.3 mg/dL 9.1 9.2 9.1  Total Protein 6.5 - 8.1 g/dL - - -  Total Bilirubin 0.3 - 1.2 mg/dL - - -  Alkaline Phos 38 - 126 U/L - - -  AST 15 - 41 U/L - - -  ALT 17 - 63 U/L - - -    Renal function CrCl cannot be calculated (Patient's most recent lab result is older than the maximum 21 days allowed.).  No results found for: HGBA1C  No results found for: LDLCALC, LDLC, HIRISKLDL, POCLDL, LDLDIRECT, REALLDLC, TOTLDLC   Carotid Duplex 07/10/20   Yevonne Aline. Stanford Breed, MD [facility] Phone Number: [phone] 07/11/2020 9:31 AM

## 2020-07-12 DIAGNOSIS — M542 Cervicalgia: Secondary | ICD-10-CM | POA: Diagnosis not present

## 2020-07-13 DIAGNOSIS — R3914 Feeling of incomplete bladder emptying: Secondary | ICD-10-CM | POA: Diagnosis not present

## 2020-07-13 DIAGNOSIS — N401 Enlarged prostate with lower urinary tract symptoms: Secondary | ICD-10-CM | POA: Diagnosis not present

## 2020-07-13 DIAGNOSIS — N5201 Erectile dysfunction due to arterial insufficiency: Secondary | ICD-10-CM | POA: Diagnosis not present

## 2020-07-17 DIAGNOSIS — C069 Malignant neoplasm of mouth, unspecified: Secondary | ICD-10-CM | POA: Diagnosis not present

## 2020-07-17 DIAGNOSIS — J302 Other seasonal allergic rhinitis: Secondary | ICD-10-CM | POA: Diagnosis not present

## 2020-07-17 DIAGNOSIS — K1370 Unspecified lesions of oral mucosa: Secondary | ICD-10-CM | POA: Diagnosis not present

## 2020-07-19 ENCOUNTER — Encounter (HOSPITAL_COMMUNITY): Payer: Medicare Other

## 2020-07-19 ENCOUNTER — Ambulatory Visit: Payer: Medicare Other

## 2020-07-19 DIAGNOSIS — H349 Unspecified retinal vascular occlusion: Secondary | ICD-10-CM | POA: Diagnosis not present

## 2020-07-19 DIAGNOSIS — H34231 Retinal artery branch occlusion, right eye: Secondary | ICD-10-CM | POA: Diagnosis not present

## 2020-08-23 DIAGNOSIS — H349 Unspecified retinal vascular occlusion: Secondary | ICD-10-CM | POA: Diagnosis not present

## 2020-08-23 DIAGNOSIS — H34231 Retinal artery branch occlusion, right eye: Secondary | ICD-10-CM | POA: Diagnosis not present

## 2020-08-25 DIAGNOSIS — L905 Scar conditions and fibrosis of skin: Secondary | ICD-10-CM | POA: Diagnosis not present

## 2020-08-25 DIAGNOSIS — L819 Disorder of pigmentation, unspecified: Secondary | ICD-10-CM | POA: Diagnosis not present

## 2020-08-25 DIAGNOSIS — Z85828 Personal history of other malignant neoplasm of skin: Secondary | ICD-10-CM | POA: Diagnosis not present

## 2020-08-25 DIAGNOSIS — L570 Actinic keratosis: Secondary | ICD-10-CM | POA: Diagnosis not present

## 2020-09-04 DIAGNOSIS — Z23 Encounter for immunization: Secondary | ICD-10-CM | POA: Diagnosis not present

## 2020-09-12 DIAGNOSIS — Z85828 Personal history of other malignant neoplasm of skin: Secondary | ICD-10-CM | POA: Diagnosis not present

## 2020-09-12 DIAGNOSIS — L550 Sunburn of first degree: Secondary | ICD-10-CM | POA: Diagnosis not present

## 2020-09-12 DIAGNOSIS — L819 Disorder of pigmentation, unspecified: Secondary | ICD-10-CM | POA: Diagnosis not present

## 2020-09-12 DIAGNOSIS — L905 Scar conditions and fibrosis of skin: Secondary | ICD-10-CM | POA: Diagnosis not present

## 2020-09-20 DIAGNOSIS — M542 Cervicalgia: Secondary | ICD-10-CM | POA: Diagnosis not present

## 2020-10-04 DIAGNOSIS — L905 Scar conditions and fibrosis of skin: Secondary | ICD-10-CM | POA: Diagnosis not present

## 2020-10-04 DIAGNOSIS — C44729 Squamous cell carcinoma of skin of left lower limb, including hip: Secondary | ICD-10-CM | POA: Diagnosis not present

## 2020-10-04 DIAGNOSIS — L570 Actinic keratosis: Secondary | ICD-10-CM | POA: Diagnosis not present

## 2020-10-04 DIAGNOSIS — Z85828 Personal history of other malignant neoplasm of skin: Secondary | ICD-10-CM | POA: Diagnosis not present

## 2020-10-04 DIAGNOSIS — D485 Neoplasm of uncertain behavior of skin: Secondary | ICD-10-CM | POA: Diagnosis not present

## 2020-10-17 DIAGNOSIS — J302 Other seasonal allergic rhinitis: Secondary | ICD-10-CM | POA: Diagnosis not present

## 2020-10-17 DIAGNOSIS — K1370 Unspecified lesions of oral mucosa: Secondary | ICD-10-CM | POA: Diagnosis not present

## 2020-10-17 DIAGNOSIS — H6983 Other specified disorders of Eustachian tube, bilateral: Secondary | ICD-10-CM | POA: Diagnosis not present

## 2020-11-03 DIAGNOSIS — M5382 Other specified dorsopathies, cervical region: Secondary | ICD-10-CM | POA: Diagnosis not present

## 2020-11-03 DIAGNOSIS — R600 Localized edema: Secondary | ICD-10-CM | POA: Diagnosis not present

## 2020-11-03 DIAGNOSIS — M4802 Spinal stenosis, cervical region: Secondary | ICD-10-CM | POA: Diagnosis not present

## 2020-11-03 DIAGNOSIS — M50221 Other cervical disc displacement at C4-C5 level: Secondary | ICD-10-CM | POA: Diagnosis not present

## 2020-11-03 DIAGNOSIS — G9589 Other specified diseases of spinal cord: Secondary | ICD-10-CM | POA: Diagnosis not present

## 2020-11-06 ENCOUNTER — Other Ambulatory Visit: Payer: Self-pay | Admitting: Orthopedic Surgery

## 2020-11-07 ENCOUNTER — Other Ambulatory Visit: Payer: Self-pay | Admitting: Orthopedic Surgery

## 2020-11-07 DIAGNOSIS — M542 Cervicalgia: Secondary | ICD-10-CM

## 2020-11-08 ENCOUNTER — Other Ambulatory Visit: Payer: Self-pay | Admitting: Internal Medicine

## 2020-11-08 ENCOUNTER — Ambulatory Visit
Admission: RE | Admit: 2020-11-08 | Discharge: 2020-11-08 | Disposition: A | Discharge status: Home / Self Care | Payer: Medicare Other | Source: Ambulatory Visit | Attending: Orthopedic Surgery | Admitting: Orthopedic Surgery

## 2020-11-08 DIAGNOSIS — M4312 Spondylolisthesis, cervical region: Secondary | ICD-10-CM | POA: Diagnosis not present

## 2020-11-08 DIAGNOSIS — M542 Cervicalgia: Secondary | ICD-10-CM

## 2020-11-08 DIAGNOSIS — M47812 Spondylosis without myelopathy or radiculopathy, cervical region: Secondary | ICD-10-CM | POA: Diagnosis not present

## 2020-11-08 DIAGNOSIS — G9520 Unspecified cord compression: Secondary | ICD-10-CM | POA: Diagnosis not present

## 2020-11-08 DIAGNOSIS — M5021 Other cervical disc displacement,  high cervical region: Secondary | ICD-10-CM | POA: Diagnosis not present

## 2020-11-10 DIAGNOSIS — Z6826 Body mass index (BMI) 26.0-26.9, adult: Secondary | ICD-10-CM | POA: Diagnosis not present

## 2020-11-10 DIAGNOSIS — G959 Disease of spinal cord, unspecified: Secondary | ICD-10-CM | POA: Diagnosis not present

## 2020-11-13 ENCOUNTER — Telehealth: Payer: Self-pay | Admitting: *Deleted

## 2020-11-13 ENCOUNTER — Other Ambulatory Visit: Payer: Self-pay | Admitting: Neurological Surgery

## 2020-11-13 NOTE — Telephone Encounter (Signed)
   [facility] Pre-operative Risk Assessment    Patient Name: [name]  DOB: December 24, 1940  MRN: [id number]    Request for surgical clearance:  What type of surgery is being performed? C4-5, C5-6, C6-7 anterior cervical decompression/discectomy/fusion   When is this surgery scheduled? 11/30/20   What type of clearance is required (medical clearance vs. Pharmacy clearance to hold med vs. Both)? both  Are there any medications that need to be held prior to surgery and how long?  Eliquis    Practice name and name of physician performing surgery? [facility] Dr. Ellene Route   What is the office phone number? [phone]   7.   What is the office fax number? [phone] attn: Janett Billow  8.   Anesthesia type (None, local, MAC, general) ? general   [name] 11/13/2020, 4:34 PM  _________________________________________________________________   (provider comments below)

## 2020-11-14 NOTE — Telephone Encounter (Signed)
   Name: [name]  DOB: August 23, 1940  MRN: [id number]   Primary Cardiologist: Skeet Latch, MD  Chart reviewed as part of pre-operative protocol coverage. Patient was contacted 11/14/2020 in reference to pre-operative risk assessment for pending surgery as outlined below.  [name] was last seen on 06/28/20 by Dr. Oval Linsey.  Since that day, [name] has done well.  He does not have a history ischemic heart disease. He can complete more than 4.0 METS without angina.   Therefore, based on ACC/AHA guidelines, the patient would be at acceptable risk for the planned procedure without further cardiovascular testing.   The patient was advised that if he develops new symptoms prior to surgery to contact our office to arrange for a follow-up visit, and he verbalized understanding.  I will route this recommendation to the requesting party via Epic fax function and remove from pre-op pool. Please call with questions.  Tami Lin Jayvier Burgher, PA 11/14/2020, 10:54 AM

## 2020-11-23 ENCOUNTER — Other Ambulatory Visit: Payer: Self-pay | Admitting: Cardiovascular Disease

## 2020-11-23 DIAGNOSIS — I712 Thoracic aortic aneurysm, without rupture: Secondary | ICD-10-CM

## 2020-11-23 DIAGNOSIS — I7121 Aneurysm of the ascending aorta, without rupture: Secondary | ICD-10-CM

## 2020-11-24 NOTE — Pre-Procedure Instructions (Signed)
Surgical Instructions    Your procedure is scheduled on Thursday, July 14th.  Report to [facility] Main Entrance "A" at 5:30 A.M., then check in with the Admitting office.  Call this number if you have problems the morning of surgery:  [phone]   If you have any questions prior to your surgery date call [phone]: Open Monday-Friday 8am-4pm    Remember:  Do not eat or drink after midnight the night before your surgery    Take these medicines the morning of surgery with A SIP OF WATER  fenofibrate (TRICOR)  finasteride (PROSCAR) rosuvastatin (CRESTOR)  tamsulosin (FLOMAX)    Take these medication as needed esomeprazole (NEXIUM)  fluticasone (FLONASE)  Fluticasone-Salmeterol (ADVAIR)  loratadine (CLARITIN)   Follow your surgeon's instructions on when to stop ELIQUIS.  If no instructions were given by your surgeon then you will need to call the office to get those instructions.    As of today, STOP taking any Aspirin (unless otherwise instructed by your surgeon) Aleve, Naproxen, Ibuprofen, Motrin, Advil, Goody's, BC's, all herbal medications, fish oil, and all vitamins. This includes CBD Oil.                      Do NOT Smoke (Tobacco/Vaping) or drink Alcohol 24 hours prior to your procedure.  If you use a CPAP at night, you may bring all equipment for your overnight stay.   Contacts, glasses, piercing's, hearing aid's, dentures or partials may not be worn into surgery, please bring cases for these belongings.    For patients admitted to the hospital, discharge time will be determined by your treatment team.   Patients discharged the day of surgery will not be allowed to drive home, and someone needs to stay with them for 24 hours.  ONLY 1 SUPPORT PERSON MAY BE PRESENT WHILE YOU ARE IN SURGERY. IF YOU ARE TO BE ADMITTED ONCE YOU ARE IN YOUR ROOM YOU WILL BE ALLOWED TWO (2) VISITORS.  Minor children may have two parents present. Special consideration for safety and  communication needs will be reviewed on a case by case basis.   Special instructions:   [facility]- Preparing For Surgery  Before surgery, you can play an important role. Because skin is not sterile, your skin needs to be as free of germs as possible. You can reduce the number of germs on your skin by washing with CHG (chlorahexidine gluconate) Soap before surgery.  CHG is an antiseptic cleaner which kills germs and bonds with the skin to continue killing germs even after washing.    Oral Hygiene is also important to reduce your risk of infection.  Remember - BRUSH YOUR TEETH THE MORNING OF SURGERY WITH YOUR REGULAR TOOTHPASTE  Please do not use if you have an allergy to CHG or antibacterial soaps. If your skin becomes reddened/irritated stop using the CHG.  Do not shave (including legs and underarms) for at least 48 hours prior to first CHG shower. It is OK to shave your face.  Please follow these instructions carefully.   Shower the NIGHT BEFORE SURGERY and the MORNING OF SURGERY  If you chose to wash your hair, wash your hair first as usual with your normal shampoo.  After you shampoo, rinse your hair and body thoroughly to remove the shampoo.  Use CHG Soap as you would any other liquid soap. You can apply CHG directly to the skin and wash gently with a scrungie or a clean washcloth.   Apply the  CHG Soap to your body ONLY FROM THE NECK DOWN.  Do not use on open wounds or open sores. Avoid contact with your eyes, ears, mouth and genitals (private parts). Wash Face and genitals (private parts)  with your normal soap.   Wash thoroughly, paying special attention to the area where your surgery will be performed.  Thoroughly rinse your body with warm water from the neck down.  DO NOT shower/wash with your normal soap after using and rinsing off the CHG Soap.  Pat yourself dry with a CLEAN TOWEL.  Wear CLEAN PAJAMAS to bed the night before surgery  Place CLEAN SHEETS on your bed the  night before your surgery  DO NOT SLEEP WITH PETS.   Day of Surgery: Shower with CHG soap. Do not wear jewelry. Do not wear lotions, powders, colognes, or deodorant. Men may shave face and neck. Do not bring valuables to the hospital. [facility] is not responsible for any belongings or valuables. Wear Clean/Comfortable clothing the morning of surgery Remember to brush your teeth WITH YOUR REGULAR TOOTHPASTE.   Please read over the following fact sheets that you were given.

## 2020-11-27 ENCOUNTER — Encounter (HOSPITAL_COMMUNITY)
Admission: RE | Admit: 2020-11-27 | Discharge: 2020-11-27 | Disposition: A | Discharge status: Home / Self Care | Payer: Medicare Other | Source: Ambulatory Visit | Attending: Neurological Surgery | Admitting: Neurological Surgery

## 2020-11-27 ENCOUNTER — Other Ambulatory Visit: Payer: Self-pay

## 2020-11-27 ENCOUNTER — Encounter (HOSPITAL_COMMUNITY): Payer: Self-pay

## 2020-11-27 DIAGNOSIS — Z20822 Contact with and (suspected) exposure to covid-19: Secondary | ICD-10-CM | POA: Insufficient documentation

## 2020-11-27 DIAGNOSIS — Z01812 Encounter for preprocedural laboratory examination: Secondary | ICD-10-CM | POA: Insufficient documentation

## 2020-11-27 HISTORY — DX: Chronic obstructive pulmonary disease, unspecified: J44.9

## 2020-11-27 LAB — TYPE AND SCREEN
ABO/RH(D): AB POS
Antibody Screen: NEGATIVE

## 2020-11-27 LAB — CBC
HCT: 48 % (ref 39.0–52.0)
Hemoglobin: 15.4 g/dL (ref 13.0–17.0)
MCH: 32.1 pg (ref 26.0–34.0)
MCHC: 32.1 g/dL (ref 30.0–36.0)
MCV: 100 fL (ref 80.0–100.0)
Platelets: 162 10*3/uL (ref 150–400)
RBC: 4.8 MIL/uL (ref 4.22–5.81)
RDW: 14.4 % (ref 11.5–15.5)
WBC: 9.3 10*3/uL (ref 4.0–10.5)
nRBC: 0 % (ref 0.0–0.2)

## 2020-11-27 LAB — BASIC METABOLIC PANEL
Anion gap: 9 (ref 5–15)
BUN: 26 mg/dL — ABNORMAL HIGH (ref 8–23)
CO2: 26 mmol/L (ref 22–32)
Calcium: 9.8 mg/dL (ref 8.9–10.3)
Chloride: 102 mmol/L (ref 98–111)
Creatinine, Ser: 1.6 mg/dL — ABNORMAL HIGH (ref 0.61–1.24)
GFR, Estimated: 44 mL/min — ABNORMAL LOW (ref >60)
Glucose, Bld: 129 mg/dL — ABNORMAL HIGH (ref 70–99)
Potassium: 3.7 mmol/L (ref 3.5–5.1)
Sodium: 137 mmol/L (ref 135–145)

## 2020-11-27 LAB — SURGICAL PCR SCREEN
MRSA, PCR: NEGATIVE
Staphylococcus aureus: POSITIVE — AB

## 2020-11-27 NOTE — Progress Notes (Signed)
Abnormal creatinine in PAT 1.60. The result was communicated to [facility], Dr. Ellene Route office.

## 2020-11-27 NOTE — Progress Notes (Signed)
PCP - Shon Baton, MD Cardiologist - Skeet Latch, MD  PPM/ICD - denies Device Orders - N/A Rep Notified - N/A  Chest x-ray - N/A EKG - 12/27/2019 Stress Test - 04/20/2018 ECHO - 05/03/2020 Cardiac Cath - denies  Sleep Study - yes CPAP - no  Fasting Blood Sugar - N/A  Blood Thinner Instructions: Eliquis - last dose 11/26/2020  Aspirin Instructions: patient was instructed: As of today, STOP taking any Aspirin (unless otherwise instructed by your surgeon) Aleve, Naproxen, Ibuprofen, Motrin, Advil, Goody's, BC's, all herbal medications, fish oil, and all vitamins. This includes CBD Oil.   ERAS Protcol - No PRE-SURGERY Ensure or G2- N/A  COVID TEST- 11/27/2020   Anesthesia review: yes; cardiac clearance on 11/14/2020  Patient denies shortness of breath, fever, cough and chest pain at PAT appointment   All instructions explained to the patient, with a verbal understanding of the material. Patient agrees to go over the instructions while at home for a better understanding. Patient also instructed to self quarantine after being tested for COVID-19. The opportunity to ask questions was provided.

## 2020-11-28 LAB — SARS CORONAVIRUS 2 (TAT 6-24 HRS): SARS Coronavirus 2: NEGATIVE

## 2020-11-28 NOTE — Progress Notes (Addendum)
Anesthesia Chart Review:  Case: [id number] Date/Time: 11/30/20 0715   Procedure: Cervical 4-5, Cervical 5-6, Cervical 6-7 Anterior cervical decompression/discectomy/fusion   Anesthesia type: General   Pre-op diagnosis: Cervical myelopathy   Location: [facility] OR ROOM 20 / [place] OR   Surgeons: Kristeen Miss, MD       DISCUSSION: Patient is a 80 year old male scheduled for the above procedure.  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  [name] was last seen on 06/28/20 by Dr. Oval Linsey.  Since that day, [name] has done well.  He does not have a history ischemic heart disease. He can complete more than 4.0 METS without angina.    Therefore, based on ACC/AHA guidelines, the patient would be at acceptable risk for the planned procedure without further cardiovascular testing..."  Last Eliquis 11/26/2020.  Preoperative labs showed BUN 26, creatinine 1.60.  CBC normal.  [name] has known CKD.  In review of lab trends his creatinine has been somewhat variable, ranging from ~ 1.2-1.5 since at least 2019.   11/27/2020 presurgical COVID-19 test is still in process.  Last PCP office note with labs requested, but is still pending. (UPDATE 11/29/20  5:44 PM: COVID test negative. PCP records still pending.)   VS: BP 112/75   Pulse 90   Temp 36.6 C (Oral)   Resp 18   Ht 5\' 10"  (1.778 m)   Wt 84.8 kg   SpO2 99%   BMI 26.82 kg/m    PROVIDERS: Shon Baton, MD is PCP  Skeet Latch, MD is cardiologist.  Last visit 06/28/20.  1 year follow-up recommended. Jerrell Belfast, MD is ENT Jamelle Haring, MD is vascular surgeon. Last visit 07/11/20.  1 year follow-up recommended.  Brand Males, MD is pulmonologist.  Last visit 03/01/20 for COPD and PE history follow-up.  Eliquis reduced to 2.5 mg twice daily given 5 years since PE.  Smoking vessation recommended.  45-month follow-up recommended.   LABS: Preoperative labs noted. See DISCUSSION. (all labs ordered are listed, but only abnormal results are displayed)  Labs Reviewed  SURGICAL PCR SCREEN - Abnormal; Notable for the following components:      Result Value   Staphylococcus aureus POSITIVE (*)    All other components within normal limits  BASIC METABOLIC PANEL - Abnormal; Notable for the following components:   Glucose, Bld 129 (*)    BUN 26 (*)    Creatinine, Ser 1.60 (*)    GFR, Estimated 44 (*)    All other components within normal limits  SARS CORONAVIRUS 2 (TAT 6-24 HRS)  CBC  TYPE AND SCREEN     IMAGES: CT C-spine 11/08/20: IMPRESSION: - Negative for cervical spine fracture. Bone marrow edema in the dens and right lateral mass of C1 and C2 is felt to be related to degenerative change. - Advanced cervical spondylosis. Extensive spinal and foraminal stenosis  at multiple levels as described above.  CTA chest/abd/pelvis 04/24/20: IMPRESSION: VASCULAR 1. Similar appearing fusiform aneurysmal changes of the ascending aorta measuring up to 44 mm. Recommend annual imaging followup by CTA or MRA. This recommendation follows 2010 ACCF/AHA/AATS/ACR/ASA/SCA/SCAI/SIR/STS/SVM Guidelines for the Diagnosis and Management of Patients with Thoracic Aortic  Disease. Circulation. 2010; 121: K562-B638. Aortic aneurysm NOS (ICD10-I71.9) 2. Similar appearing fusiform ectasia of the infrarenal abdominal aorta measuring up to 29 mm. 3. Aortic atherosclerosis, most pronounced in the infrarenal aorta. Aortic Atherosclerosis (ICD10-I70.0).   NON VASCULAR 1. No acute abnormality in the chest, abdomen, or pelvis. 2.  Emphysema (ICD10-J43.9).    EKG: 12/27/2019: Sinus rhythm with premature atrial complexes with aberrant conduction    CV: Carotid US 07/10/20: Summary:  Right Carotid: Velocities in the right ICA are consistent with a 1-39%  stenosis.  Left Carotid: Velocities in the left ICA are consistent with a 1-39%  stenosis.  Vertebrals:  Bilateral vertebral arteries demonstrate antegrade flow.  Subclavians: Normal flow hemodynamics were seen in bilateral subclavian               arteries.    Echo 05/03/20 IMPRESSIONS   1. Left ventricular ejection fraction, by estimation, is 60 to 65%. The  left ventricle has normal function. The left ventricle has no regional  wall motion abnormalities. There is moderate left ventricular hypertrophy.  Left ventricular diastolic  parameters were normal.   2. Right ventricular systolic function is normal. The right ventricular  size is normal.   3. Left atrial size was moderately dilated.   4. The mitral valve is degenerative. Mild mitral valve regurgitation. No  evidence of mitral stenosis. Moderate mitral annular calcification.   5. The aortic valve is tricuspid. There is moderate calcification of the  aortic valve. There is moderate thickening of the aortic valve. Aortic  valve regurgitation is mild. Moderate aortic valve stenosis.   6. Aortic dilatation noted. There is mild dilatation of the ascending  aorta, measuring 40 mm.   7. The inferior vena cava is normal in size with greater than 50%  respiratory variability, suggesting right atrial pressure of 3 mmHg.  - Comparison 04/29/19: LVEFL 55-60%,  moderate AS, Vmax 2.8 m/s, MG  53mmHg, AVA 1.3 cm2    Myocardial Perfusion 04/20/18 Nuclear stress EF: 50%. There was no ST segment deviation noted during stress. No T wave inversion was noted during stress. The study is normal. This is a low risk study. The left ventricular ejection fraction is mildly decreased (45-54%). Abnormal TID ratio of 1.25 indicates a possibility of balanced ischemia. Though the specificity of this finding is limited in a pharmacologic stress test compared with an exercise stress test.   Past Medical History:  Diagnosis Date   Anxiety    reports scotch helps that    Aortic stenosis, mild 08/18/2018   Mean gradient 14 mmHg 04/2018   Arthritis    " it's everywhere"   Blood transfusion without reported diagnosis    Cataract    having cataract surgery July 2016   Chronic bronchitis ([place])    Chronic kidney disease    hx kidney stones   Colon polyps    Complication of anesthesia    after surgery 07-2013 unable to sleep for 3 days   COPD (chronic obstructive pulmonary disease) ([place])    Coronary artery calcification seen on CAT scan 08/18/2018   GERD (gastroesophageal reflux disease)    History of kidney stones    Hyperlipidemia    Hypertension  Prostate hypertrophy    Pulmonary embolism ([place]) 12/2014   post TJR   Seasonal allergies    Skin cancer    squamous cell, 1 melanoma left shoulder, one episode- under tongue, has had a total of 17-18 episodes    Sleep apnea    had surgery in 2000 for sleep apnea, no CPAP needed   Thoracic aortic aneurysm ([place]) 05/04/2019   4.6 cm 04/2019    Past Surgical History:  Procedure Laterality Date   CAROTID ENDARTERECTOMY Right Oct 01, 2013   CE   COLONOSCOPY     ENDARTERECTOMY Right 10/01/2013   Procedure: ENDARTERECTOMY CAROTID-RIGHT;  Surgeon: Rosetta Posner, MD;  Location: [place];  Service: Vascular;  Laterality: Right;   ESOPHAGOGASTRODUODENOSCOPY ENDOSCOPY  10-12-2013   EXCISION ORAL LESION WITH CO2 LASER  N/A 09/08/2015   Procedure: EXCISION ORAL LESION WITH CO2 LASER;  Surgeon: Jerrell Belfast, MD;  Location: [facility] OR;  Service: ENT;  Laterality: N/A;   EYE SURGERY Bilateral    cataracts, W/IOL   JOINT REPLACEMENT Right September 02, 2014   Knee   JOINT REPLACEMENT Left 2012   Knee   PATCH ANGIOPLASTY Right 10/01/2013   Procedure: PATCH ANGIOPLASTY USING HEMASHIELD FINESSE PATCH;  Surgeon: Rosetta Posner, MD;  Location: [place];  Service: Vascular;  Laterality: Right;   removal of melonomia Left    shoulder   REPLACEMENT TOTAL KNEE Left    REVERSE SHOULDER ARTHROPLASTY Right 10/11/2016   Procedure: REVERSE RIGHT SHOULDER ARTHROPLASTY;  Surgeon: Netta Cedars, MD;  Location: [place];  Service: Orthopedics;  Laterality: Right;   REVERSE SHOULDER ARTHROPLASTY Left 12/03/2019   Procedure: REVERSE SHOULDER ARTHROPLASTY;  Surgeon: Netta Cedars, MD;  Location: [facility];  Service: Orthopedics;  Laterality: Left;  interscalene block   RHINOPLASTY     1999   RHINOPLASTY     SKIN CANCER EXCISION     squamous cell cancer under tongue   SKIN SURGERY     squamous cell carcinoma removed in various places,melanoma removed from L shoulder   sleep apnea surgery  2000   STERIOD INJECTION Left 10/11/2016   Procedure: STEROID INJECTION LEFT SHOULDER;  Surgeon: Netta Cedars, MD;  Location: [place];  Service: Orthopedics;  Laterality: Left;   TOTAL HIP ARTHROPLASTY Left 10/01/2017   Procedure: LEFT TOTAL HIP ARTHROPLASTY ANTERIOR APPROACH;  Surgeon: Gaynelle Arabian, MD;  [facility];  Service: Orthopedics;  Laterality: Left;   TOTAL KNEE ARTHROPLASTY Right 09/02/2014   Procedure: RIGHT TOTAL KNEE ARTHROPLASTY;  Surgeon: Netta Cedars, MD;  Location: [place];  Service: Orthopedics;  Laterality: Right;    MEDICATIONS:  acidophilus (RISAQUAD) CAPS capsule   Camphor-Menthol-Methyl Sal (SALONPAS) 3.05-25-08 % PTCH   cholecalciferol (VITAMIN D) 1000 units tablet   ELIQUIS 2.5 MG TABS tablet   esomeprazole (NEXIUM) 40 MG capsule    fenofibrate (TRICOR) 145 MG tablet   finasteride (PROSCAR) 5 MG tablet   fluticasone (FLONASE) 50 MCG/ACT nasal spray   Fluticasone-Salmeterol (ADVAIR) 100-50 MCG/DOSE AEPB   furosemide (LASIX) 20 MG tablet   loratadine (CLARITIN) 10 MG tablet   losartan-hydrochlorothiazide (HYZAAR) 100-12.5 MG tablet   methocarbamol (ROBAXIN) 500 MG tablet   NON FORMULARY   Omega-3 Fatty Acids (FISH OIL) 1000 MG CAPS   oxyCODONE-acetaminophen (PERCOCET) 5-325 MG tablet   predniSONE (DELTASONE) 10 MG tablet   probenecid (BENEMID) 500 MG tablet   rosuvastatin (CRESTOR) 10 MG tablet   sildenafil (VIAGRA) 25 MG tablet   tamsulosin (FLOMAX) 0.4 MG CAPS capsule  No current facility-administered medications for this encounter.  He is not currently taking Percocet, prednisone, Robaxin.   Myra Gianotti, PA-C Surgical Short Stay/Anesthesiology [facility] Phone [phone] [facility] Phone [phone] 11/28/2020 8:41 PM

## 2020-11-28 NOTE — Anesthesia Preprocedure Evaluation (Addendum)
Anesthesia Evaluation  Patient identified by MRN, date of birth, ID band Patient awake    Reviewed: Allergy & Precautions, NPO status , Patient's Chart, lab work & pertinent test results  History of Anesthesia Complications Negative for: history of anesthetic complications  Airway Mallampati: III  TM Distance: >3 FB Neck ROM: Limited    Dental  (+) Dental Advisory Given, Poor Dentition   Pulmonary sleep apnea , COPD, Current Smoker and Patient abstained from smoking., PE   Pulmonary exam normal        Cardiovascular hypertension, Pt. on medications + CAD  Normal cardiovascular exam+ Valvular Problems/Murmurs AS    '22 Carotid US - 1-39% b/l ICAS  '21 TTE - EF 60 to 65%. Moderate left ventricular hypertrophy. LA size was moderately dilated. Mild MR. Mild AI. Moderate AS. There is mild dilatation of the ascending aorta, measuring 40 mm.     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, s/p wide local excision and reconstruction flap   Reproductive/Obstetrics                           Anesthesia Physical Anesthesia Plan  ASA: 3  Anesthesia Plan: General   Post-op Pain Management:    Induction: Intravenous  PONV Risk Score and Plan: 3 and Treatment may vary due to age or medical condition, Ondansetron and Aprepitant  Airway Management Planned: Oral ETT and Video Laryngoscope Planned  Additional Equipment: None  Intra-op Plan:   Post-operative Plan: Extubation in OR  Informed Consent: I have reviewed the patients History and Physical, chart, labs and discussed  the procedure including the risks, benefits and alternatives for the proposed anesthesia with the patient or authorized representative who has indicated his/her understanding and acceptance.     Dental advisory given  Plan Discussed with: CRNA and Anesthesiologist  Anesthesia Plan Comments:       Anesthesia Quick Evaluation

## 2020-11-30 ENCOUNTER — Inpatient Hospital Stay (HOSPITAL_COMMUNITY)
Admission: RE | Disposition: A | Discharge status: Home / Self Care | Payer: Self-pay | Source: Home / Self Care | Attending: Neurological Surgery

## 2020-11-30 ENCOUNTER — Inpatient Hospital Stay (HOSPITAL_COMMUNITY): Payer: Medicare Other | Admitting: Vascular Surgery

## 2020-11-30 ENCOUNTER — Encounter (HOSPITAL_COMMUNITY): Payer: Self-pay | Admitting: Neurological Surgery

## 2020-11-30 ENCOUNTER — Inpatient Hospital Stay (HOSPITAL_COMMUNITY): Payer: Medicare Other

## 2020-11-30 ENCOUNTER — Inpatient Hospital Stay (HOSPITAL_COMMUNITY)
Admission: RE | Admit: 2020-11-30 | Discharge: 2020-12-01 | DRG: 029 | Disposition: A | Discharge status: Home / Self Care | Payer: Medicare Other | Attending: Neurological Surgery | Admitting: Neurological Surgery

## 2020-11-30 ENCOUNTER — Inpatient Hospital Stay (HOSPITAL_COMMUNITY): Payer: Medicare Other | Admitting: Anesthesiology

## 2020-11-30 DIAGNOSIS — Z85820 Personal history of malignant melanoma of skin: Secondary | ICD-10-CM

## 2020-11-30 DIAGNOSIS — Z96612 Presence of left artificial shoulder joint: Secondary | ICD-10-CM | POA: Diagnosis present

## 2020-11-30 DIAGNOSIS — Z8349 Family history of other endocrine, nutritional and metabolic diseases: Secondary | ICD-10-CM | POA: Diagnosis not present

## 2020-11-30 DIAGNOSIS — M4712 Other spondylosis with myelopathy, cervical region: Secondary | ICD-10-CM | POA: Diagnosis present

## 2020-11-30 DIAGNOSIS — M2578 Osteophyte, vertebrae: Secondary | ICD-10-CM | POA: Diagnosis present

## 2020-11-30 DIAGNOSIS — Z8719 Personal history of other diseases of the digestive system: Secondary | ICD-10-CM

## 2020-11-30 DIAGNOSIS — Z7901 Long term (current) use of anticoagulants: Secondary | ICD-10-CM

## 2020-11-30 DIAGNOSIS — Z96653 Presence of artificial knee joint, bilateral: Secondary | ICD-10-CM | POA: Diagnosis present

## 2020-11-30 DIAGNOSIS — Z4682 Encounter for fitting and adjustment of non-vascular catheter: Secondary | ICD-10-CM | POA: Diagnosis not present

## 2020-11-30 DIAGNOSIS — M1990 Unspecified osteoarthritis, unspecified site: Secondary | ICD-10-CM | POA: Diagnosis not present

## 2020-11-30 DIAGNOSIS — F17290 Nicotine dependence, other tobacco product, uncomplicated: Secondary | ICD-10-CM | POA: Diagnosis not present

## 2020-11-30 DIAGNOSIS — E785 Hyperlipidemia, unspecified: Secondary | ICD-10-CM | POA: Diagnosis present

## 2020-11-30 DIAGNOSIS — Z85828 Personal history of other malignant neoplasm of skin: Secondary | ICD-10-CM | POA: Diagnosis not present

## 2020-11-30 DIAGNOSIS — I712 Thoracic aortic aneurysm, without rupture: Secondary | ICD-10-CM | POA: Diagnosis present

## 2020-11-30 DIAGNOSIS — Z87442 Personal history of urinary calculi: Secondary | ICD-10-CM | POA: Diagnosis not present

## 2020-11-30 DIAGNOSIS — Z8249 Family history of ischemic heart disease and other diseases of the circulatory system: Secondary | ICD-10-CM | POA: Diagnosis not present

## 2020-11-30 DIAGNOSIS — Z808 Family history of malignant neoplasm of other organs or systems: Secondary | ICD-10-CM | POA: Diagnosis not present

## 2020-11-30 DIAGNOSIS — K219 Gastro-esophageal reflux disease without esophagitis: Secondary | ICD-10-CM | POA: Diagnosis not present

## 2020-11-30 DIAGNOSIS — I2510 Atherosclerotic heart disease of native coronary artery without angina pectoris: Secondary | ICD-10-CM | POA: Diagnosis present

## 2020-11-30 DIAGNOSIS — Z881 Allergy status to other antibiotic agents status: Secondary | ICD-10-CM

## 2020-11-30 DIAGNOSIS — M4322 Fusion of spine, cervical region: Secondary | ICD-10-CM | POA: Diagnosis not present

## 2020-11-30 DIAGNOSIS — J449 Chronic obstructive pulmonary disease, unspecified: Secondary | ICD-10-CM | POA: Diagnosis not present

## 2020-11-30 DIAGNOSIS — Z96611 Presence of right artificial shoulder joint: Secondary | ICD-10-CM | POA: Diagnosis present

## 2020-11-30 DIAGNOSIS — R292 Abnormal reflex: Secondary | ICD-10-CM | POA: Diagnosis not present

## 2020-11-30 DIAGNOSIS — I10 Essential (primary) hypertension: Secondary | ICD-10-CM | POA: Diagnosis not present

## 2020-11-30 DIAGNOSIS — G4730 Sleep apnea, unspecified: Secondary | ICD-10-CM | POA: Diagnosis present

## 2020-11-30 DIAGNOSIS — Z79899 Other long term (current) drug therapy: Secondary | ICD-10-CM

## 2020-11-30 DIAGNOSIS — N400 Enlarged prostate without lower urinary tract symptoms: Secondary | ICD-10-CM | POA: Diagnosis present

## 2020-11-30 DIAGNOSIS — Z86711 Personal history of pulmonary embolism: Secondary | ICD-10-CM

## 2020-11-30 DIAGNOSIS — Z96643 Presence of artificial hip joint, bilateral: Secondary | ICD-10-CM | POA: Diagnosis present

## 2020-11-30 DIAGNOSIS — G959 Disease of spinal cord, unspecified: Secondary | ICD-10-CM | POA: Diagnosis present

## 2020-11-30 DIAGNOSIS — M5412 Radiculopathy, cervical region: Principal | ICD-10-CM | POA: Diagnosis present

## 2020-11-30 DIAGNOSIS — M4722 Other spondylosis with radiculopathy, cervical region: Secondary | ICD-10-CM | POA: Diagnosis not present

## 2020-11-30 DIAGNOSIS — Z880 Allergy status to penicillin: Secondary | ICD-10-CM

## 2020-11-30 DIAGNOSIS — Z419 Encounter for procedure for purposes other than remedying health state, unspecified: Secondary | ICD-10-CM

## 2020-11-30 HISTORY — PX: SPINAL FUSION: SHX223

## 2020-11-30 HISTORY — PX: ANTERIOR CERVICAL DECOMP/DISCECTOMY FUSION: SHX1161

## 2020-11-30 SURGERY — ANTERIOR CERVICAL DECOMPRESSION/DISCECTOMY FUSION 3 LEVELS
Anesthesia: General

## 2020-11-30 MED ORDER — LACTATED RINGERS IV SOLN: INTRAVENOUS | Status: DC

## 2020-11-30 MED ORDER — THROMBIN 5000 UNITS EX SOLR
OROMUCOSAL | Status: DC | PRN
  Administered 2020-11-30 (×2): 5 mL via TOPICAL

## 2020-11-30 MED ORDER — FENTANYL CITRATE (PF) 250 MCG/5ML IJ SOLN
INTRAMUSCULAR | Status: AC
  Filled 2020-11-30: qty 5

## 2020-11-30 MED ORDER — FENOFIBRATE 160 MG PO TABS
160.0000 mg | ORAL_TABLET | Freq: Every day | ORAL | Status: DC
  Administered 2020-11-30: 160 mg via ORAL
  Filled 2020-11-30: qty 1

## 2020-11-30 MED ORDER — BSS IO SOLN
15.0000 mL | Freq: Once | INTRAOCULAR | Status: DC
Start: 2020-11-30 — End: 2020-11-30

## 2020-11-30 MED ORDER — CEFAZOLIN SODIUM-DEXTROSE 2-4 GM/100ML-% IV SOLN
2.0000 g | Freq: Three times a day (TID) | INTRAVENOUS | Status: DC
  Administered 2020-11-30: 2 g via INTRAVENOUS
  Filled 2020-11-30: qty 100

## 2020-11-30 MED ORDER — FENTANYL CITRATE (PF) 250 MCG/5ML IJ SOLN
INTRAMUSCULAR | Status: DC | PRN
  Administered 2020-11-30 (×7): 50 ug via INTRAVENOUS

## 2020-11-30 MED ORDER — ONDANSETRON HCL 4 MG/2ML IJ SOLN: 4.0000 mg | Freq: Once | INTRAMUSCULAR | Status: DC | PRN

## 2020-11-30 MED ORDER — CHLORHEXIDINE GLUCONATE 0.12 % MT SOLN
15.0000 mL | Freq: Once | OROMUCOSAL | Status: AC
  Administered 2020-11-30: 15 mL via OROMUCOSAL
  Filled 2020-11-30: qty 15

## 2020-11-30 MED ORDER — VANCOMYCIN HCL IN DEXTROSE 1-5 GM/200ML-% IV SOLN
1000.0000 mg | Freq: Once | INTRAVENOUS | Status: AC
  Administered 2020-11-30: 1000 mg via INTRAVENOUS
  Filled 2020-11-30: qty 200

## 2020-11-30 MED ORDER — TAMSULOSIN HCL 0.4 MG PO CAPS
0.4000 mg | ORAL_CAPSULE | Freq: Every day | ORAL | Status: DC
  Administered 2020-11-30: 0.4 mg via ORAL
  Filled 2020-11-30: qty 1

## 2020-11-30 MED ORDER — HYDROCHLOROTHIAZIDE 12.5 MG PO CAPS: 12.5000 mg | ORAL_CAPSULE | Freq: Every day | ORAL | Status: DC

## 2020-11-30 MED ORDER — THROMBIN 5000 UNITS EX SOLR
CUTANEOUS | Status: AC
  Filled 2020-11-30: qty 5000

## 2020-11-30 MED ORDER — DEXAMETHASONE SODIUM PHOSPHATE 10 MG/ML IJ SOLN
INTRAMUSCULAR | Status: AC
  Filled 2020-11-30: qty 1

## 2020-11-30 MED ORDER — ACETAMINOPHEN 325 MG PO TABS: 650.0000 mg | ORAL_TABLET | ORAL | Status: DC | PRN

## 2020-11-30 MED ORDER — OXYCODONE HCL 5 MG PO TABS
5.0000 mg | ORAL_TABLET | Freq: Once | ORAL | Status: AC | PRN
  Administered 2020-11-30: 5 mg via ORAL

## 2020-11-30 MED ORDER — FUROSEMIDE 20 MG PO TABS: 20.0000 mg | ORAL_TABLET | Freq: Every day | ORAL | Status: DC | PRN

## 2020-11-30 MED ORDER — FLUTICASONE PROPIONATE 50 MCG/ACT NA SUSP
2.0000 | Freq: Every day | NASAL | Status: DC | PRN
  Filled 2020-11-30: qty 16

## 2020-11-30 MED ORDER — LOSARTAN POTASSIUM 50 MG PO TABS: 100.0000 mg | ORAL_TABLET | Freq: Every day | ORAL | Status: DC

## 2020-11-30 MED ORDER — PHENYLEPHRINE 40 MCG/ML (10ML) SYRINGE FOR IV PUSH (FOR BLOOD PRESSURE SUPPORT)
PREFILLED_SYRINGE | INTRAVENOUS | Status: DC | PRN
  Administered 2020-11-30 (×2): 80 ug via INTRAVENOUS
  Administered 2020-11-30: 120 ug via INTRAVENOUS
  Administered 2020-11-30: 80 ug via INTRAVENOUS

## 2020-11-30 MED ORDER — ONDANSETRON HCL 4 MG PO TABS: 4.0000 mg | ORAL_TABLET | Freq: Four times a day (QID) | ORAL | Status: DC | PRN

## 2020-11-30 MED ORDER — ROSUVASTATIN CALCIUM 5 MG PO TABS
10.0000 mg | ORAL_TABLET | Freq: Every day | ORAL | Status: DC
  Administered 2020-11-30: 10 mg via ORAL
  Filled 2020-11-30: qty 2

## 2020-11-30 MED ORDER — METHOCARBAMOL 1000 MG/10ML IJ SOLN
500.0000 mg | Freq: Four times a day (QID) | INTRAMUSCULAR | Status: DC | PRN
  Filled 2020-11-30: qty 5

## 2020-11-30 MED ORDER — PROPOFOL 10 MG/ML IV BOLUS
INTRAVENOUS | Status: AC
  Filled 2020-11-30: qty 20

## 2020-11-30 MED ORDER — FLEET ENEMA 7-19 GM/118ML RE ENEM: 1.0000 | ENEMA | Freq: Once | RECTAL | Status: DC | PRN

## 2020-11-30 MED ORDER — BISACODYL 10 MG RE SUPP: 10.0000 mg | Freq: Every day | RECTAL | Status: DC | PRN

## 2020-11-30 MED ORDER — LORATADINE 10 MG PO TABS: 10.0000 mg | ORAL_TABLET | Freq: Every day | ORAL | Status: DC | PRN

## 2020-11-30 MED ORDER — PHENOL 1.4 % MT LIQD: 1.0000 | OROMUCOSAL | Status: DC | PRN

## 2020-11-30 MED ORDER — PROPOFOL 10 MG/ML IV BOLUS
INTRAVENOUS | Status: DC | PRN
  Administered 2020-11-30: 50 mg via INTRAVENOUS
  Administered 2020-11-30: 70 mg via INTRAVENOUS

## 2020-11-30 MED ORDER — FINASTERIDE 5 MG PO TABS
5.0000 mg | ORAL_TABLET | Freq: Every day | ORAL | Status: DC
  Administered 2020-11-30: 5 mg via ORAL
  Filled 2020-11-30: qty 1

## 2020-11-30 MED ORDER — OXYCODONE-ACETAMINOPHEN 5-325 MG PO TABS
1.0000 | ORAL_TABLET | ORAL | Status: DC | PRN
  Administered 2020-11-30 (×2): 2 via ORAL
  Filled 2020-11-30 (×2): qty 2

## 2020-11-30 MED ORDER — SODIUM CHLORIDE 0.9% FLUSH
3.0000 mL | Freq: Two times a day (BID) | INTRAVENOUS | Status: DC
  Administered 2020-11-30 (×2): 3 mL via INTRAVENOUS

## 2020-11-30 MED ORDER — KETOROLAC TROMETHAMINE 0.5 % OP SOLN: 1.0000 [drp] | Freq: Three times a day (TID) | OPHTHALMIC | Status: DC | PRN

## 2020-11-30 MED ORDER — ONDANSETRON HCL 4 MG/2ML IJ SOLN
INTRAMUSCULAR | Status: DC | PRN
  Administered 2020-11-30: 4 mg via INTRAVENOUS

## 2020-11-30 MED ORDER — MENTHOL 3 MG MT LOZG: 1.0000 | LOZENGE | OROMUCOSAL | Status: DC | PRN

## 2020-11-30 MED ORDER — PROBENECID 500 MG PO TABS
500.0000 mg | ORAL_TABLET | Freq: Every day | ORAL | Status: DC
  Administered 2020-12-01: 500 mg via ORAL
  Filled 2020-11-30: qty 1

## 2020-11-30 MED ORDER — OXYCODONE HCL 5 MG PO TABS
ORAL_TABLET | ORAL | Status: AC
  Filled 2020-11-30: qty 1

## 2020-11-30 MED ORDER — POLYMYXIN B-TRIMETHOPRIM 10000-0.1 UNIT/ML-% OP SOLN
1.0000 [drp] | Freq: Three times a day (TID) | OPHTHALMIC | Status: DC
Start: 2020-11-30 — End: 2020-11-30

## 2020-11-30 MED ORDER — PANTOPRAZOLE SODIUM 40 MG PO TBEC: 80.0000 mg | DELAYED_RELEASE_TABLET | Freq: Every day | ORAL | Status: DC

## 2020-11-30 MED ORDER — THROMBIN 5000 UNITS EX SOLR
CUTANEOUS | Status: DC | PRN
  Administered 2020-11-30: 10000 [IU] via TOPICAL

## 2020-11-30 MED ORDER — SODIUM CHLORIDE 0.9% FLUSH: 3.0000 mL | INTRAVENOUS | Status: DC | PRN

## 2020-11-30 MED ORDER — ONDANSETRON HCL 4 MG/2ML IJ SOLN: 4.0000 mg | Freq: Four times a day (QID) | INTRAMUSCULAR | Status: DC | PRN

## 2020-11-30 MED ORDER — DEXAMETHASONE SODIUM PHOSPHATE 10 MG/ML IJ SOLN
INTRAMUSCULAR | Status: DC | PRN
  Administered 2020-11-30: 10 mg via INTRAVENOUS

## 2020-11-30 MED ORDER — OXYCODONE HCL 5 MG/5ML PO SOLN: 5.0000 mg | Freq: Once | ORAL | Status: AC | PRN

## 2020-11-30 MED ORDER — HYDROMORPHONE HCL 1 MG/ML IJ SOLN: 0.5000 mg | INTRAMUSCULAR | Status: DC | PRN

## 2020-11-30 MED ORDER — 0.9 % SODIUM CHLORIDE (POUR BTL) OPTIME
TOPICAL | Status: DC | PRN
  Administered 2020-11-30: 1000 mL

## 2020-11-30 MED ORDER — SODIUM CHLORIDE 0.9 % IV SOLN
250.0000 mL | INTRAVENOUS | Status: DC
  Administered 2020-11-30: 250 mL via INTRAVENOUS

## 2020-11-30 MED ORDER — BUPIVACAINE HCL (PF) 0.5 % IJ SOLN
INTRAMUSCULAR | Status: AC
  Filled 2020-11-30: qty 30

## 2020-11-30 MED ORDER — METHOCARBAMOL 500 MG PO TABS
500.0000 mg | ORAL_TABLET | Freq: Four times a day (QID) | ORAL | Status: DC | PRN
  Administered 2020-11-30 (×2): 500 mg via ORAL
  Filled 2020-11-30: qty 1

## 2020-11-30 MED ORDER — POLYETHYLENE GLYCOL 3350 17 G PO PACK: 17.0000 g | PACK | Freq: Every day | ORAL | Status: DC | PRN

## 2020-11-30 MED ORDER — BUPIVACAINE HCL (PF) 0.5 % IJ SOLN
INTRAMUSCULAR | Status: DC | PRN
  Administered 2020-11-30: 2.5 mL

## 2020-11-30 MED ORDER — LIDOCAINE-EPINEPHRINE 1 %-1:100000 IJ SOLN
INTRAMUSCULAR | Status: AC
  Filled 2020-11-30: qty 1

## 2020-11-30 MED ORDER — SUGAMMADEX SODIUM 200 MG/2ML IV SOLN
INTRAVENOUS | Status: DC | PRN
  Administered 2020-11-30: 100 mg via INTRAVENOUS
  Administered 2020-11-30: 200 mg via INTRAVENOUS

## 2020-11-30 MED ORDER — LOSARTAN POTASSIUM-HCTZ 100-12.5 MG PO TABS: 1.0000 | ORAL_TABLET | Freq: Every day | ORAL | Status: DC

## 2020-11-30 MED ORDER — VANCOMYCIN HCL IN DEXTROSE 1-5 GM/200ML-% IV SOLN
1000.0000 mg | INTRAVENOUS | Status: AC
  Administered 2020-11-30: 1000 mg via INTRAVENOUS
  Filled 2020-11-30: qty 200

## 2020-11-30 MED ORDER — ACETAMINOPHEN 650 MG RE SUPP: 650.0000 mg | RECTAL | Status: DC | PRN

## 2020-11-30 MED ORDER — THROMBIN 5000 UNITS EX SOLR
CUTANEOUS | Status: AC
  Filled 2020-11-30: qty 10000

## 2020-11-30 MED ORDER — RISAQUAD PO CAPS
1.0000 | ORAL_CAPSULE | Freq: Every day | ORAL | Status: DC
  Filled 2020-11-30: qty 1

## 2020-11-30 MED ORDER — ORAL CARE MOUTH RINSE: 15.0000 mL | Freq: Once | OROMUCOSAL | Status: AC

## 2020-11-30 MED ORDER — ROCURONIUM BROMIDE 10 MG/ML (PF) SYRINGE
PREFILLED_SYRINGE | INTRAVENOUS | Status: DC | PRN
  Administered 2020-11-30 (×3): 50 mg via INTRAVENOUS

## 2020-11-30 MED ORDER — CHLORHEXIDINE GLUCONATE CLOTH 2 % EX PADS: 6.0000 | MEDICATED_PAD | Freq: Once | CUTANEOUS | Status: DC

## 2020-11-30 MED ORDER — LIDOCAINE-EPINEPHRINE 1 %-1:100000 IJ SOLN
INTRAMUSCULAR | Status: DC | PRN
  Administered 2020-11-30: 2.5 mL

## 2020-11-30 MED ORDER — DOCUSATE SODIUM 100 MG PO CAPS
100.0000 mg | ORAL_CAPSULE | Freq: Two times a day (BID) | ORAL | Status: DC
  Administered 2020-11-30: 100 mg via ORAL
  Filled 2020-11-30: qty 1

## 2020-11-30 MED ORDER — FENTANYL CITRATE (PF) 100 MCG/2ML IJ SOLN
INTRAMUSCULAR | Status: AC
  Filled 2020-11-30: qty 2

## 2020-11-30 MED ORDER — LIDOCAINE 2% (20 MG/ML) 5 ML SYRINGE
INTRAMUSCULAR | Status: DC | PRN
  Administered 2020-11-30: 60 mg via INTRAVENOUS

## 2020-11-30 MED ORDER — ONDANSETRON HCL 4 MG/2ML IJ SOLN
INTRAMUSCULAR | Status: AC
  Filled 2020-11-30: qty 2

## 2020-11-30 MED ORDER — METHOCARBAMOL 500 MG PO TABS
ORAL_TABLET | ORAL | Status: AC
  Filled 2020-11-30: qty 1

## 2020-11-30 MED ORDER — ROCURONIUM BROMIDE 10 MG/ML (PF) SYRINGE
PREFILLED_SYRINGE | INTRAVENOUS | Status: AC
  Filled 2020-11-30: qty 10

## 2020-11-30 MED ORDER — PHENYLEPHRINE HCL-NACL 10-0.9 MG/250ML-% IV SOLN
INTRAVENOUS | Status: DC | PRN
  Administered 2020-11-30: 50 ug/min via INTRAVENOUS
  Administered 2020-11-30: 65 ug/min via INTRAVENOUS

## 2020-11-30 MED ORDER — HEMOSTATIC AGENTS (NO CHARGE) OPTIME
TOPICAL | Status: DC | PRN
  Administered 2020-11-30: 1 via TOPICAL

## 2020-11-30 MED ORDER — SENNA 8.6 MG PO TABS
1.0000 | ORAL_TABLET | Freq: Two times a day (BID) | ORAL | Status: DC
  Administered 2020-11-30: 8.6 mg via ORAL
  Filled 2020-11-30: qty 1

## 2020-11-30 MED ORDER — PHENYLEPHRINE 40 MCG/ML (10ML) SYRINGE FOR IV PUSH (FOR BLOOD PRESSURE SUPPORT)
PREFILLED_SYRINGE | INTRAVENOUS | Status: AC
  Filled 2020-11-30: qty 10

## 2020-11-30 MED ORDER — EPHEDRINE SULFATE-NACL 50-0.9 MG/10ML-% IV SOSY
PREFILLED_SYRINGE | INTRAVENOUS | Status: DC | PRN
  Administered 2020-11-30: 15 mg via INTRAVENOUS
  Administered 2020-11-30: 10 mg via INTRAVENOUS

## 2020-11-30 MED ORDER — LIDOCAINE 2% (20 MG/ML) 5 ML SYRINGE
INTRAMUSCULAR | Status: AC
  Filled 2020-11-30: qty 5

## 2020-11-30 MED ORDER — MOMETASONE FURO-FORMOTEROL FUM 100-5 MCG/ACT IN AERO
2.0000 | INHALATION_SPRAY | Freq: Two times a day (BID) | RESPIRATORY_TRACT | Status: DC
  Administered 2020-11-30 – 2020-12-01 (×2): 2 via RESPIRATORY_TRACT
  Filled 2020-11-30: qty 8.8

## 2020-11-30 MED ORDER — FENTANYL CITRATE (PF) 100 MCG/2ML IJ SOLN
25.0000 ug | INTRAMUSCULAR | Status: DC | PRN
  Administered 2020-11-30: 25 ug via INTRAVENOUS
  Administered 2020-11-30: 50 ug via INTRAVENOUS

## 2020-11-30 MED ORDER — APREPITANT 40 MG PO CAPS
40.0000 mg | ORAL_CAPSULE | Freq: Once | ORAL | Status: AC
Start: 2020-11-30 — End: 2020-11-30
  Administered 2020-11-30: 40 mg via ORAL
  Filled 2020-11-30: qty 1

## 2020-11-30 MED ORDER — MIDAZOLAM HCL 2 MG/2ML IJ SOLN
INTRAMUSCULAR | Status: AC
  Filled 2020-11-30: qty 2

## 2020-11-30 SURGICAL SUPPLY — 59 items
ADH SKN CLS APL DERMABOND .7 (GAUZE/BANDAGES/DRESSINGS) ×1
BAG COUNTER SPONGE SURGICOUNT (BAG) ×3 IMPLANT
BAG SPNG CNTER NS LX DISP (BAG) ×2
BAND INSRT 18 STRL LF DISP RB (MISCELLANEOUS) ×2
BAND RUBBER #18 3X1/16 STRL (MISCELLANEOUS) ×2 IMPLANT
BASKET BONE COLLECTION (BASKET) ×1 IMPLANT
BIT DRILL ACP 15 (DRILL) IMPLANT
BIT DRILL NEURO 2X3.1 SFT TUCH (MISCELLANEOUS) ×1 IMPLANT
BNDG GAUZE ELAST 4 BULKY (GAUZE/BANDAGES/DRESSINGS) IMPLANT
BUR BARREL STRAIGHT FLUTE 4.0 (BURR) ×1 IMPLANT
CAGE CERV MOD 7X17X14 7D (Cage) ×1 IMPLANT
CAGE CERV MOD 8X17X14 7D (Cage) ×2 IMPLANT
CANISTER SUCT 3000ML PPV (MISCELLANEOUS) ×2 IMPLANT
DECANTER SPIKE VIAL GLASS SM (MISCELLANEOUS) ×1 IMPLANT
DERMABOND ADVANCED (GAUZE/BANDAGES/DRESSINGS) ×1
DERMABOND ADVANCED .7 DNX12 (GAUZE/BANDAGES/DRESSINGS) ×1 IMPLANT
DRAPE LAPAROTOMY 100X72 PEDS (DRAPES) ×2 IMPLANT
DRAPE MICROSCOPE LEICA (MISCELLANEOUS) ×1 IMPLANT
DRILL ACP 15 (DRILL) ×2
DRILL NEURO 2X3.1 SOFT TOUCH (MISCELLANEOUS) ×2
DURAPREP 6ML APPLICATOR 50/CS (WOUND CARE) ×2 IMPLANT
ELECT COATED BLADE 2.86 ST (ELECTRODE) ×2 IMPLANT
ELECT REM PT RETURN 9FT ADLT (ELECTROSURGICAL) ×2
ELECTRODE REM PT RTRN 9FT ADLT (ELECTROSURGICAL) ×1 IMPLANT
GAUZE 4X4 16PLY ~~LOC~~+RFID DBL (SPONGE) ×1 IMPLANT
GLOVE EXAM NITRILE XL STR (GLOVE) IMPLANT
GLOVE SURG LTX SZ8.5 (GLOVE) ×2 IMPLANT
GLOVE SURG UNDER POLY LF SZ8.5 (GLOVE) ×2 IMPLANT
GOWN STRL REUS W/ TWL LRG LVL3 (GOWN DISPOSABLE) IMPLANT
GOWN STRL REUS W/ TWL XL LVL3 (GOWN DISPOSABLE) ×1 IMPLANT
GOWN STRL REUS W/TWL 2XL LVL3 (GOWN DISPOSABLE) ×2 IMPLANT
GOWN STRL REUS W/TWL LRG LVL3 (GOWN DISPOSABLE)
GOWN STRL REUS W/TWL XL LVL3 (GOWN DISPOSABLE) ×2
HALTER HD/CHIN CERV TRACTION D (MISCELLANEOUS) ×2 IMPLANT
HEMOSTAT POWDER KIT SURGIFOAM (HEMOSTASIS) ×3 IMPLANT
KIT BASIN OR (CUSTOM PROCEDURE TRAY) ×2 IMPLANT
KIT TURNOVER KIT B (KITS) ×2 IMPLANT
NDL SPNL 22GX3.5 QUINCKE BK (NEEDLE) ×1 IMPLANT
NEEDLE HYPO 22GX1.5 SAFETY (NEEDLE) ×2 IMPLANT
NEEDLE SPNL 22GX3.5 QUINCKE BK (NEEDLE) ×2 IMPLANT
NS IRRIG 1000ML POUR BTL (IV SOLUTION) ×2 IMPLANT
PACK LAMINECTOMY NEURO (CUSTOM PROCEDURE TRAY) ×2 IMPLANT
PAD ARMBOARD 7.5X6 YLW CONV (MISCELLANEOUS) ×6 IMPLANT
PATTIES SURGICAL .5 X1 (DISPOSABLE) ×2 IMPLANT
PIN ACP TEMP FIXATION (EXFIX) ×1 IMPLANT
PIN DISTRACTION 14MM (PIN) IMPLANT
PLATE ACP 1.9X58 3LVL (Plate) ×1 IMPLANT
SCREW ACP VA ST 3.5X15 (Screw) ×8 IMPLANT
SET WALTER ACTIVATION W/DRAPE (SET/KITS/TRAYS/PACK) ×2 IMPLANT
SPONGE INTESTINAL PEANUT (DISPOSABLE) ×2 IMPLANT
SPONGE SURGIFOAM ABS GEL SZ50 (HEMOSTASIS) ×1 IMPLANT
SPONGE T-LAP 4X18 ~~LOC~~+RFID (SPONGE) ×2 IMPLANT
SUT PROLENE 5 0 C 1 24 (SUTURE) ×1 IMPLANT
SUT PROLENE 6 0 BV (SUTURE) ×1 IMPLANT
SUT VIC AB 4-0 RB1 18 (SUTURE) ×3 IMPLANT
TOWEL GREEN STERILE (TOWEL DISPOSABLE) ×2 IMPLANT
TOWEL GREEN STERILE FF (TOWEL DISPOSABLE) ×2 IMPLANT
TRAY FOLEY MTR SLVR 16FR STAT (SET/KITS/TRAYS/PACK) ×1 IMPLANT
WATER STERILE IRR 1000ML POUR (IV SOLUTION) ×2 IMPLANT

## 2020-11-30 NOTE — Anesthesia Procedure Notes (Signed)
Procedure Name: Intubation Date/Time: 11/30/2020 8:13 AM Performed by: Lance Coon, CRNA Pre-anesthesia Checklist: Patient identified, Emergency Drugs available, Suction available, Patient being monitored and Timeout performed Patient Re-evaluated:Patient Re-evaluated prior to induction Oxygen Delivery Method: Circle system utilized Preoxygenation: Pre-oxygenation with 100% oxygen Induction Type: IV induction Ventilation: Mask ventilation without difficulty Laryngoscope Size: Glidescope and 4 Grade View: Grade I Tube type: Oral Tube size: 7.5 mm Number of attempts: 1 Airway Equipment and Method: Stylet and Video-laryngoscopy Placement Confirmation: ETT inserted through vocal cords under direct vision, positive ETCO2 and breath sounds checked- equal and bilateral Secured at: 22 cm Tube secured with: Tape Dental Injury: Teeth and Oropharynx as per pre-operative assessment

## 2020-11-30 NOTE — Progress Notes (Signed)
Orthopedic Tech Progress Note Patient Details:  [name] 12/31/1940 [id number]  Ortho Devices Type of Ortho Device: Soft collar Ortho Device/Splint Interventions: Ordered     Left collar at bedside Chip Boer 11/30/2020, 12:29 PM

## 2020-11-30 NOTE — H&P (Signed)
[name] is an 80 y.o. male.   Chief Complaint: Generalized weakness in arms and legs secondary to cervical spondylitic disease HPI: [name]. [name] is a 80 year old individual who initially presented with problems walking.  He felt his legs were getting weaker.  Evaluation by neurology noted that he had some upper extremity weakness.  An MRI of the cervical spine demonstrates that he has severe spondylitic stenosis with cord compression at C4-5 C5-6 and C6-C7.  He was sent for neurosurgical evaluation and surgery has been advised to decompress these areas.  Now admitted for that procedure.  Past Medical History:  Diagnosis Date   Anxiety    reports scotch helps that    Aortic stenosis, mild 08/18/2018   Mean gradient 14 mmHg 04/2018   Arthritis    " it's everywhere"   Blood transfusion without reported diagnosis    Cataract    having cataract surgery July 2016   Chronic bronchitis ([place])    Chronic kidney disease    hx kidney stones   Colon polyps    Complication of anesthesia    after surgery 07-2013 unable to sleep for 3 days   COPD (chronic obstructive pulmonary disease) ([place])    Coronary artery calcification seen on CAT scan 08/18/2018   GERD (gastroesophageal reflux disease)    History of kidney stones    Hyperlipidemia    Hypertension    Prostate hypertrophy    Pulmonary embolism ([place]) 12/2014   post TJR   Seasonal allergies    Skin cancer    squamous cell, 1 melanoma left shoulder, one episode- under tongue, has had a total of 17-18 episodes    Sleep apnea    had surgery in 2000 for sleep apnea, no CPAP needed   Thoracic aortic aneurysm ([place]) 05/04/2019   4.6 cm 04/2019    Past Surgical History:  Procedure Laterality Date   CAROTID ENDARTERECTOMY Right Oct 01, 2013   CE   COLONOSCOPY     ENDARTERECTOMY Right 10/01/2013   Procedure: ENDARTERECTOMY CAROTID-RIGHT;  Surgeon: Rosetta Posner, MD;  Location: [place];  Service: Vascular;  Laterality: Right;    ESOPHAGOGASTRODUODENOSCOPY ENDOSCOPY  10-12-2013   EXCISION ORAL LESION WITH CO2 LASER N/A 09/08/2015   Procedure: EXCISION ORAL LESION WITH CO2 LASER;  Surgeon: Jerrell Belfast, MD;  Location: [facility] OR;  Service: ENT;  Laterality: N/A;   EYE SURGERY Bilateral    cataracts, W/IOL   JOINT REPLACEMENT Right September 02, 2014   Knee   JOINT REPLACEMENT Left 2012   Knee   PATCH ANGIOPLASTY Right 10/01/2013   Procedure: PATCH ANGIOPLASTY USING HEMASHIELD FINESSE PATCH;  Surgeon: Rosetta Posner, MD;  Location: [place];  Service: Vascular;  Laterality: Right;   removal of melonomia Left    shoulder   REPLACEMENT TOTAL KNEE Left    REVERSE SHOULDER ARTHROPLASTY Right 10/11/2016   Procedure: REVERSE RIGHT SHOULDER ARTHROPLASTY;  Surgeon: Netta Cedars, MD;  Location: [place];  Service: Orthopedics;  Laterality: Right;   REVERSE SHOULDER ARTHROPLASTY Left 12/03/2019   Procedure: REVERSE SHOULDER ARTHROPLASTY;  Surgeon: Netta Cedars, MD;  [facility];  Service: Orthopedics;  Laterality: Left;  interscalene block   RHINOPLASTY     1999   RHINOPLASTY     SKIN CANCER EXCISION     squamous cell cancer under tongue   SKIN SURGERY     squamous cell carcinoma removed in various places,melanoma removed from L shoulder   sleep apnea surgery  2000  STERIOD INJECTION Left 10/11/2016   Procedure: STEROID INJECTION LEFT SHOULDER;  Surgeon: Netta Cedars, MD;  Location: [place];  Service: Orthopedics;  Laterality: Left;   TOTAL HIP ARTHROPLASTY Left 10/01/2017   Procedure: LEFT TOTAL HIP ARTHROPLASTY ANTERIOR APPROACH;  Surgeon: Gaynelle Arabian, MD;  [facility];  Service: Orthopedics;  Laterality: Left;   TOTAL KNEE ARTHROPLASTY Right 09/02/2014   Procedure: RIGHT TOTAL KNEE ARTHROPLASTY;  Surgeon: Netta Cedars, MD;  Location: [place];  Service: Orthopedics;  Laterality: Right;    Family History  Problem Relation Age of Onset   Diverticulosis Mother    Skin cancer Mother    Varicose Veins Mother    Heart attack  Mother    Hyperlipidemia Father    Heart disease Father    Heart attack Father    Colon cancer Neg Hx    Esophageal cancer Neg Hx    Rectal cancer Neg Hx    Stomach cancer Neg Hx    Social History:  reports that he has been smoking cigars. He has never used smokeless tobacco. He reports current alcohol use of about 21.0 standard drinks of alcohol per week. He reports that he does not use drugs.  Allergies:  Allergies  Allergen Reactions   Cefaclor Other (See Comments)     skin split   Penicillins Rash    Has patient had a PCN reaction causing immediate rash, facial/tongue/throat swelling, SOB or lightheadedness with hypotension:unsure Has patient had a PCN reaction causing severe rash involving mucus membranes or skin necrosis:unsure Has patient had a PCN reaction that required hospitalization:No Has patient had a PCN reaction occurring within the last 10 years:No--childhood allergy If all of the above answers are "NO", then may proceed with Cephalosporin use.     Medications Prior to Admission  Medication Sig Dispense Refill   acidophilus (RISAQUAD) CAPS capsule Take 1 capsule by mouth daily.     Camphor-Menthol-Methyl Sal (SALONPAS) 3.05-25-08 % PTCH Apply 1 application topically daily as needed (kneck pain).     cholecalciferol (VITAMIN D) 1000 units tablet Take 1,000 Units by mouth daily.     ELIQUIS 2.5 MG TABS tablet TAKE ONE TABLET TWICE DAILY (Patient taking differently: Take 2.5 mg by mouth 2 (two) times daily.) 60 tablet 3   fenofibrate (TRICOR) 145 MG tablet Take 145 mg by mouth daily before breakfast.      finasteride (PROSCAR) 5 MG tablet Take 5 mg by mouth daily.     fluticasone (FLONASE) 50 MCG/ACT nasal spray Place 2 sprays into both nostrils daily as needed for allergies or rhinitis.     Fluticasone-Salmeterol (ADVAIR) 100-50 MCG/DOSE AEPB Inhale 1 puff into the lungs 2 (two) times daily as needed (for respiratory issues.).     furosemide (LASIX) 20 MG tablet Take 1  tablet (20 mg total) by mouth daily as needed for edema. 30 tablet 2   losartan-hydrochlorothiazide (HYZAAR) 100-12.5 MG tablet Take 1 tablet by mouth daily.     NON FORMULARY Place 1,000 mg under the tongue daily. CBD Oil     Omega-3 Fatty Acids (FISH OIL) 1000 MG CAPS Take 1,000 mg by mouth daily.     probenecid (BENEMID) 500 MG tablet Take 500 mg by mouth daily before breakfast.      rosuvastatin (CRESTOR) 10 MG tablet Take 10 mg by mouth daily.     tamsulosin (FLOMAX) 0.4 MG CAPS capsule Take 0.4 mg by mouth daily.      esomeprazole (NEXIUM) 40 MG capsule Take 40 mg  by mouth daily as needed (acid reflux).     loratadine (CLARITIN) 10 MG tablet Take 10 mg by mouth daily as needed for allergies.     methocarbamol (ROBAXIN) 500 MG tablet Take 1 tablet (500 mg total) by mouth every 8 (eight) hours as needed for muscle spasms. (Patient not taking: No sig reported) 40 tablet 1   oxyCODONE-acetaminophen (PERCOCET) 5-325 MG tablet Take 1 tablet by mouth every 4 (four) hours as needed for severe pain. (Patient not taking: No sig reported) 30 tablet 0   predniSONE (DELTASONE) 10 MG tablet Take 4tabsx2days, 2tabsx2days, 1tabx2days, 0.5tabx2days, then stop (Patient not taking: No sig reported) 15 tablet 0   sildenafil (VIAGRA) 25 MG tablet Take 25 mg by mouth daily as needed for erectile dysfunction.      No results found for this or any previous visit (from the past 48 hour(s)). No results found.  Review of Systems  Constitutional:  Positive for activity change.  HENT: Negative.    Eyes: Negative.   Respiratory: Negative.    Cardiovascular: Negative.   Gastrointestinal: Negative.   Endocrine: Negative.   Genitourinary: Negative.   Musculoskeletal:  Positive for gait problem, neck pain and neck stiffness.  Skin: Negative.   Neurological:  Positive for weakness and numbness.  Hematological: Negative.   Psychiatric/Behavioral: Negative.     Blood pressure 114/74, pulse 90, temperature (!) 97.3  F (36.3 C), temperature source Oral, resp. rate 18, height 5\' 8"  (1.727 m), weight 84.7 kg, SpO2 95 %. Physical Exam Constitutional:      Appearance: Normal appearance.  HENT:     Head: Normocephalic and atraumatic.     Nose: Nose normal.     Mouth/Throat:     Mouth: Mucous membranes are moist.  Eyes:     Extraocular Movements: Extraocular movements intact.     Pupils: Pupils are equal, round, and reactive to light.  Neck:     Comments: Decreased range of motion turning only 30 degrees left and right flexing and extending only 50% of normal.  Tenderness in the supraclavicular fossa's bilaterally noted.  No masses are noted. Cardiovascular:     Rate and Rhythm: Normal rate and regular rhythm.     Pulses: Normal pulses.     Heart sounds: Normal heart sounds.  Pulmonary:     Effort: Pulmonary effort is normal.     Breath sounds: Normal breath sounds.  Abdominal:     General: Abdomen is flat.     Palpations: Abdomen is soft.  Musculoskeletal:     Cervical back: Neck supple.     Comments: Decreased strength in the deltoids and biceps graded 4 out of 5 triceps and grips and intrinsics strength is also graded at 4 out of 5 lower extremity strength demonstrates normal strength to confrontational testing but a wide-based gait is noted.  Patient has some mild hyperreflexia in the patellae absent reflexes in the Achilles.  Skin:    General: Skin is warm and dry.     Capillary Refill: Capillary refill takes less than 2 seconds.  Neurological:     Mental Status: He is alert.     Comments: See musculoskeletal exam.  Cranial nerve examination is within the limits of normal.  Upper extremity reflexes are absent in the biceps and the triceps and brachioradialis.  Psychiatric:        Mood and Affect: Mood normal.        Behavior: Behavior normal.        Thought Content:  Thought content normal.        Judgment: Judgment normal.     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  Plan: Decompression via anterior approach with discectomy C4-5 C5-6 and C6-C7.  Anterior plate stabilization.  Earleen Newport, MD 11/30/2020, 7:49 AM

## 2020-11-30 NOTE — Op Note (Signed)
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  Cervical radiculopathy.  Postoperative diagnosis: Same Procedure: Anterior cervical decompression arthrodesis C4-5 C5-6 C6-C7.  Arthrodesis with morselized autograft and fixation with titanium plate C4-C7.  Titanium structural spacers at C4-5 C5-6 and C6-C7. Surgeon: Kristeen Miss First Assistant: Duffy Rhody, MD Anesthesia: General endotracheal Indications: [name] is a 80 year old individuals had significant difficulty with his walking.  He also has had some difficulty with his upper extremities in regards to fine motor movement and control.  This prompted an MRI of the cervical spine which demonstrates the patient has advanced spondylitic stenosis with cord compression at C4-5 C5-6 and C6-C7 there is cord signal change between C4-5 and C5-6.  After reviewing the films noting that the patient had gross myelopathy he was advised that he undergo surgical decompression of the 3 levels involved C4-5 C5-6 and C6-7.  Procedure: The patient was brought to the operating room supine on the stretcher.  After the smooth induction of general endotracheal anesthesia and placement of a Foley catheter patient was placed in 5 pounds of halter traction neck was prepped with alcohol DuraPrep and draped in a sterile fashion.  A transverse incision was made on the left side neck and carried down through the platysma the plane between the sternocleidomastoid and the strap muscles dissected bluntly until the prevertebral space was reached.  First identifiable disc space was noted to be that of C3-4 and the dissection was carried inferiorly to expose C4-5 C5-6 and C6-C7 there is noted that the patient had substantial ventral osteophytosis at the C4-5 level this was carefully cleared with a high-speed drill and Leksell rongeurs and osteophytectomy tools.  The disc base was then identified and entered and a 2 mm  matchstick bur was used on a high-speed drill to open the disc space and allow for placement of some distraction to further facilitate evacuation of the disc space.  Once the disc space was opened more fully large osteophytes were encountered on the inferior margin against the dura of the spinal canal.  These were removed carefully with a 1 and 2 mm Kerrison punch good decompression was achieved from left to right.  Hemostasis was achieved here in 8 mm tall 15 x 17 mm spacer was placed.  It was filled with autograft which was obtained from shaving the endplates with a high-speed bur.  Attention was then turned to C5-6 where a similar process was carried out in the same size 8 mm tall 10 degree lordotic spacer 15 x 17 was placed into this interspace.  At the C6-C7 disc space a 7 mm spacer measuring 15 x 17 mm was placed after being filled with autograft also.  Good decompression was had at all 3 levels in the care was taken to make sure that the exiting nerve roots were well decompressed also.  Hemostasis was achieved in each of the spaces and then a 52 mm standard size ACP plate from [employer] was placed over the vertebral bodies and secured with 815 mm locking screws.  Final x-ray identified good position of the construct and good decompression.  Hemostasis was achieved in the soft tissues and blood loss was noted to be about 200 cc the platysma was closed with 3-0 Vicryl in interrupted fashion 3-0 Vicryl was used in subcuticular tissues Dermabond was placed on the skin.

## 2020-11-30 NOTE — Anesthesia Postprocedure Evaluation (Signed)
Anesthesia Post Note  Patient: [name]  Procedure(s) Performed: Cervical Four-Five, Cervical Five-Six, Cervical Six-Seven Anterior cervical decompression/discectomy/fusion     Patient location during evaluation: PACU Anesthesia Type: General Level of consciousness: awake and alert Pain management: pain level controlled Vital Signs Assessment: post-procedure vital signs reviewed and stable Respiratory status: spontaneous breathing, nonlabored ventilation, respiratory function stable and patient connected to nasal cannula oxygen Cardiovascular status: blood pressure returned to baseline and stable Postop Assessment: no apparent nausea or vomiting Anesthetic complications: yes (Suspected corneal abrasion of left eye)   No notable events documented.  Last Vitals:  Vitals:   11/30/20 1245 11/30/20 1300  BP: 109/68 102/64  Pulse: (!) 101 100  Resp: (!) 22 16  Temp:    SpO2: 92% 93%    Last Pain:  Vitals:   11/30/20 1300  TempSrc:   PainSc: [name]

## 2020-11-30 NOTE — Transfer of Care (Signed)
Immediate Anesthesia Transfer of Care Note  Patient: [name]  Procedure(s) Performed: Cervical Four-Five, Cervical Five-Six, Cervical Six-Seven Anterior cervical decompression/discectomy/fusion  Patient Location: PACU  Anesthesia Type:General  Level of Consciousness: awake, alert , patient cooperative and responds to stimulation  Airway & Oxygen Therapy: Patient Spontanous Breathing and Patient connected to nasal cannula oxygen  Post-op Assessment: Report given to RN and Post -op Vital signs reviewed and stable  Post vital signs: Reviewed and stable  Last Vitals:  Vitals Value Taken Time  BP 103/53 11/30/20 1158  Temp    Pulse 102 11/30/20 1202  Resp 23 11/30/20 1202  SpO2 93 % 11/30/20 1202  Vitals shown include unvalidated device data.  Last Pain:  Vitals:   11/30/20 0613  TempSrc:   PainSc: 3       Patients Stated Pain Goal: 3 (12/82/08 1388)  Complications: No notable events documented.

## 2020-12-01 ENCOUNTER — Encounter (HOSPITAL_COMMUNITY): Payer: Self-pay | Admitting: Neurological Surgery

## 2020-12-01 DIAGNOSIS — Z87442 Personal history of urinary calculi: Secondary | ICD-10-CM | POA: Diagnosis not present

## 2020-12-01 DIAGNOSIS — E785 Hyperlipidemia, unspecified: Secondary | ICD-10-CM | POA: Diagnosis present

## 2020-12-01 DIAGNOSIS — M2578 Osteophyte, vertebrae: Secondary | ICD-10-CM | POA: Diagnosis present

## 2020-12-01 DIAGNOSIS — Z8249 Family history of ischemic heart disease and other diseases of the circulatory system: Secondary | ICD-10-CM | POA: Diagnosis not present

## 2020-12-01 DIAGNOSIS — I712 Thoracic aortic aneurysm, without rupture: Secondary | ICD-10-CM | POA: Diagnosis present

## 2020-12-01 DIAGNOSIS — F17290 Nicotine dependence, other tobacco product, uncomplicated: Secondary | ICD-10-CM | POA: Diagnosis present

## 2020-12-01 DIAGNOSIS — Z96612 Presence of left artificial shoulder joint: Secondary | ICD-10-CM | POA: Diagnosis present

## 2020-12-01 DIAGNOSIS — Z85828 Personal history of other malignant neoplasm of skin: Secondary | ICD-10-CM | POA: Diagnosis not present

## 2020-12-01 DIAGNOSIS — Z96653 Presence of artificial knee joint, bilateral: Secondary | ICD-10-CM | POA: Diagnosis present

## 2020-12-01 DIAGNOSIS — Z86711 Personal history of pulmonary embolism: Secondary | ICD-10-CM | POA: Diagnosis not present

## 2020-12-01 DIAGNOSIS — Z85820 Personal history of malignant melanoma of skin: Secondary | ICD-10-CM | POA: Diagnosis not present

## 2020-12-01 DIAGNOSIS — Z8719 Personal history of other diseases of the digestive system: Secondary | ICD-10-CM | POA: Diagnosis not present

## 2020-12-01 DIAGNOSIS — M4712 Other spondylosis with myelopathy, cervical region: Secondary | ICD-10-CM | POA: Diagnosis present

## 2020-12-01 DIAGNOSIS — Z808 Family history of malignant neoplasm of other organs or systems: Secondary | ICD-10-CM | POA: Diagnosis not present

## 2020-12-01 DIAGNOSIS — I2510 Atherosclerotic heart disease of native coronary artery without angina pectoris: Secondary | ICD-10-CM | POA: Diagnosis present

## 2020-12-01 DIAGNOSIS — J449 Chronic obstructive pulmonary disease, unspecified: Secondary | ICD-10-CM | POA: Diagnosis present

## 2020-12-01 DIAGNOSIS — M1990 Unspecified osteoarthritis, unspecified site: Secondary | ICD-10-CM | POA: Diagnosis present

## 2020-12-01 DIAGNOSIS — R292 Abnormal reflex: Secondary | ICD-10-CM | POA: Diagnosis present

## 2020-12-01 DIAGNOSIS — Z8349 Family history of other endocrine, nutritional and metabolic diseases: Secondary | ICD-10-CM | POA: Diagnosis not present

## 2020-12-01 DIAGNOSIS — I10 Essential (primary) hypertension: Secondary | ICD-10-CM | POA: Diagnosis present

## 2020-12-01 DIAGNOSIS — K219 Gastro-esophageal reflux disease without esophagitis: Secondary | ICD-10-CM | POA: Diagnosis present

## 2020-12-01 DIAGNOSIS — G4730 Sleep apnea, unspecified: Secondary | ICD-10-CM | POA: Diagnosis present

## 2020-12-01 DIAGNOSIS — Z96611 Presence of right artificial shoulder joint: Secondary | ICD-10-CM | POA: Diagnosis present

## 2020-12-01 DIAGNOSIS — M5412 Radiculopathy, cervical region: Secondary | ICD-10-CM | POA: Diagnosis present

## 2020-12-01 MED ORDER — DEXAMETHASONE 1 MG PO TABS: ORAL_TABLET | ORAL | 0 refills | Status: DC

## 2020-12-01 MED ORDER — TIZANIDINE HCL 2 MG PO TABS: 2.0000 mg | ORAL_TABLET | Freq: Four times a day (QID) | ORAL | 0 refills | Status: DC | PRN

## 2020-12-01 MED ORDER — OXYCODONE-ACETAMINOPHEN 5-325 MG PO TABS: 1.0000 | ORAL_TABLET | ORAL | 0 refills | Status: DC | PRN

## 2020-12-01 MED FILL — Thrombin For Soln 5000 Unit: CUTANEOUS | Qty: 5000 | Status: AC

## 2020-12-01 NOTE — Discharge Summary (Signed)
Physician Discharge Summary  Patient ID: [name] MRN: [id number] DOB/AGE: 78-Oct-1942 78 y.o.  Admit date: 11/30/2020 Discharge date: 12/01/2020  Admission Diagnoses: Cervical myelopathy C4-5 C5-6 C6-C7 cervical radiculopathy.  History of pulmonary embolus.  COPD.  Discharge Diagnoses: Cervical myelopathy C4-5 C5-6 C6-C7.  Cervical radiculopathy.  History of pulmonary embolus.  COPD. Active Problems:   Cervical myelopathy [facility])   Discharged Condition: good  Hospital Course: Patient was admitted to undergo surgical decompression at C4-5 C5-6 C6-7 he tolerated surgery well.  Consults: None  Significant Diagnostic Studies: None  Treatments: surgery: See op note  Discharge Exam: Blood pressure 107/67, pulse 82, temperature 98.1 F (36.7 C), temperature source Oral, resp. rate 19, height 5\' 8"  (1.727 m), weight 84.7 kg, SpO2 97 %. Incision/Wound: Incision is clean and dry with expected swelling in the incision bed.  Motor function is intact in the upper extremities.  Gait is intact.  Disposition: Discharge disposition: 01-Home or Self Care       Discharge Instructions     Call MD for:  redness, tenderness, or signs of infection (pain, swelling, redness, odor or green/yellow discharge around incision site)   Complete by: As directed    Call MD for:  severe uncontrolled pain   Complete by: As directed    Call MD for:  temperature >100.4   Complete by: As directed    Diet - low sodium heart healthy   Complete by: As directed    Discharge wound care:   Complete by: As directed    Okay to shower. Do not apply salves or appointments to incision. No heavy lifting with the upper extremities greater than 10 pounds. May resume driving when not requiring pain medication and patient feels comfortable with doing so.   Incentive spirometry RT   Complete by: As directed    Increase activity slowly   Complete by: As directed       Allergies as of 12/01/2020       Reactions    Cefaclor Other (See Comments)    skin split   Penicillins Rash   Has patient had a PCN reaction causing immediate rash, facial/tongue/throat swelling, SOB or lightheadedness with hypotension:unsure Has patient had a PCN reaction causing severe rash involving mucus membranes or skin necrosis:unsure Has patient had a PCN reaction that required hospitalization:No Has patient had a PCN reaction occurring within the last 10 years:No--childhood allergy If all of the above answers are "NO", then may proceed with Cephalosporin use.        Medication List     STOP taking these medications    methocarbamol 500 MG tablet Commonly known as: Robaxin   predniSONE 10 MG tablet Commonly known as: DELTASONE       TAKE these medications    acidophilus Caps capsule Take 1 capsule by mouth daily.   cholecalciferol 1000 units tablet Commonly known as: VITAMIN D Take 1,000 Units by mouth daily.   dexamethasone 1 MG tablet Commonly known as: DECADRON 2 tablets twice daily for 2 days, one tablet twice daily for 2 days, one tablet daily for 2 days.   Eliquis 2.5 MG Tabs tablet Generic drug: apixaban TAKE ONE TABLET TWICE DAILY What changed: how much to take   esomeprazole 40 MG capsule Commonly known as: NEXIUM Take 40 mg by mouth daily as needed (acid reflux).   fenofibrate 145 MG tablet Commonly known as: TRICOR Take 145 mg by mouth daily before breakfast.   finasteride 5 MG tablet Commonly known as:  PROSCAR Take 5 mg by mouth daily.   Fish Oil 1000 MG Caps Take 1,000 mg by mouth daily.   fluticasone 50 MCG/ACT nasal spray Commonly known as: FLONASE Place 2 sprays into both nostrils daily as needed for allergies or rhinitis.   Fluticasone-Salmeterol 100-50 MCG/DOSE Aepb Commonly known as: ADVAIR Inhale 1 puff into the lungs 2 (two) times daily as needed (for respiratory issues.).   furosemide 20 MG tablet Commonly known as: LASIX Take 1 tablet (20 mg total) by mouth  daily as needed for edema.   loratadine 10 MG tablet Commonly known as: CLARITIN Take 10 mg by mouth daily as needed for allergies.   losartan-hydrochlorothiazide 100-12.5 MG tablet Commonly known as: HYZAAR Take 1 tablet by mouth daily.   NON FORMULARY Place 1,000 mg under the tongue daily. CBD Oil   oxyCODONE-acetaminophen 5-325 MG tablet Commonly known as: PERCOCET/ROXICET Take 1-2 tablets by mouth every 4 (four) hours as needed for severe pain or moderate pain. What changed:  how much to take reasons to take this   probenecid 500 MG tablet Commonly known as: BENEMID Take 500 mg by mouth daily before breakfast.   rosuvastatin 10 MG tablet Commonly known as: CRESTOR Take 10 mg by mouth daily.   Salonpas 3.05-25-08 % Ptch Generic drug: Camphor-Menthol-Methyl Sal Apply 1 application topically daily as needed (kneck pain).   sildenafil 25 MG tablet Commonly known as: VIAGRA Take 25 mg by mouth daily as needed for erectile dysfunction.   tamsulosin 0.4 MG Caps capsule Commonly known as: FLOMAX Take 0.4 mg by mouth daily.   tiZANidine 2 MG tablet Commonly known as: ZANAFLEX Take 1 tablet (2 mg total) by mouth every 6 (six) hours as needed for muscle spasms.               Discharge Care Instructions  (From admission, onward)           Start     Ordered   12/01/20 0000  Discharge wound care:       Comments: Okay to shower. Do not apply salves or appointments to incision. No heavy lifting with the upper extremities greater than 10 pounds. May resume driving when not requiring pain medication and patient feels comfortable with doing so.   12/01/20 0839            Follow-up Information     Kristeen Miss, MD Follow up.   Specialty: Neurosurgery Contact information: [address] [place] [address] [place] [address] [phone]                 Signed: DICK HARK 12/01/2020, 8:40 AM

## 2020-12-01 NOTE — Evaluation (Signed)
Physical Therapy Evaluation Patient Details Name: [name] MRN: [id number] DOB: 1940/08/04 Today's Date: 12/01/2020   History of Present Illness  The pt is a 80 yo male presenting s/p C4-5 C5-6 and C6-7 ACDF on 7/14 due to progressive weakness in all extremities and gait dysfunction. PMH includes: anxiety, CKD, COPD, HLD, HTN, skin cancer, bilateral TKA, L shoulder surgery, and L THA.   Clinical Impression  Pt in bed upon arrival of PT, agreeable to evaluation at this time. Prior to admission the pt was completely independent with all activity, working from home. The pt now presents with minor limitations in functional mobility and stability due to above dx, but is safe to return home with intermittent assist from friends as needed. The pt was able to demo good stability both with and without AD for sit-stand transfers, hallway ambulation, and stairs. The pt was educated on cervical precautions, application to movements, and ergonomics of work station to reduce strain. The pt will benefit from OPPT when cleared by MD for targeted strengthening for back and postural stabilizers to support surgery. No further acute PT needs.      Follow Up Recommendations Supervision for mobility/OOB (OPPT when cleared by surgeon for postural strengthening)    Equipment Recommendations  None recommended by PT    Recommendations for Other Services       Precautions / Restrictions Precautions Precautions: Fall;Cervical Precaution Booklet Issued: Yes (comment) Required Braces or Orthoses: Cervical Brace Cervical Brace: Soft collar (off for dressing/bathing, can remove in bed) Restrictions Weight Bearing Restrictions: No      Mobility  Bed Mobility Overal bed mobility: Modified Independent             General bed mobility comments: cues for keeping spine straight    Transfers Overall transfer level: Modified independent Equipment used: Rolling walker (2 wheeled)             General  transfer comment: pt standing impulsively without RW, stating his legs are weak and needs RW, no evidence of instability without UE support  Ambulation/Gait Ambulation/Gait assistance: Modified independent (Device/Increase time) Gait Distance (Feet): 150 Feet Assistive device: Rolling walker (2 wheeled);None Gait Pattern/deviations: WFL(Within Functional Limits) Gait velocity: 1.0 m/s Gait velocity interpretation: 1.31 - 2.62 ft/sec, indicative of limited community ambulator General Gait Details: pt moving with good speed, slight trunk flexion despite cues for posture. pt adamant he needs UE support through RW, but was able to complete short bout of ambulation without RW without LOB or instability  Stairs Stairs: Yes Stairs assistance: Supervision Stair Management: Two rails;Alternating pattern;Forwards Number of Stairs: 10 General stair comments: no evidence of instability, pt with slight trunk flexion needing cues for upright  Wheelchair Mobility    Modified Rankin (Stroke Patients Only)       Balance Overall balance assessment: No apparent balance deficits (not formally assessed)                                           Pertinent Vitals/Pain Pain Assessment: No/denies pain    Home Living Family/patient expects to be discharged to:: Private residence Living Arrangements: Alone Available Help at Discharge: Family;Available 24 hours/day Type of Home: House Home Access: Stairs to enter Entrance Stairs-Rails: Psychiatric nurse of Steps: 3 Home Layout: Two level;Able to live on main level with bedroom/bathroom Home Equipment: Gilford Rile - 2 wheels;Bedside commode;Adaptive equipment Additional Comments: Significant  other of 22 years plans to stay and assist pt as long as needed. Office upstairs, pt plans to work from home    Prior Function Level of Independence: Independent         Comments: Typically independent with ADLs, IADLs and  mobility. 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 Has housekeeper for laundry and heavier tasks     Hand Dominance   Dominant Hand: Right    Extremity/Trunk Assessment   Upper Extremity Assessment Upper Extremity Assessment: Defer to OT evaluation    Lower Extremity Assessment Lower Extremity Assessment: Overall WFL for tasks assessed (pt reports LE weakness, testing 5/5 for MMT)    Cervical / Trunk Assessment Cervical / Trunk Assessment: Normal  Communication   Communication: No difficulties  Cognition Arousal/Alertness: Awake/alert Behavior During Therapy: WFL for tasks assessed/performed Overall Cognitive Status: Within Functional Limits for tasks assessed                                 General Comments: pt with slightly decreased insight to safety. mildly impulsive. pt educated on all precautions and states he understands but will likely not be able to follow as it is not part of his muscle memory. likely at baseline cognition/personality      General Comments General comments (skin integrity, edema, etc.): VSS, pt educated on cervical precautions and ergonomics of work station    Exercises     Assessment/Plan    PT Assessment Patent does not need any further PT services  PT Problem List         PT Treatment Interventions      PT Goals (Current goals can be found in the Care Plan section)  Acute Rehab PT Goals Patient Stated Goal: go home, get back to work PT Goal Formulation: With patient Time For Goal Achievement: 12/15/20 Potential to Achieve Goals: Good    Frequency     Barriers to discharge        Co-evaluation               AM-PAC PT "6 Clicks" Mobility  Outcome Measure Help needed turning from your back to your side while in a flat bed without using bedrails?: None Help needed moving from lying on your back to sitting on the side of a flat bed without using bedrails?:  None Help needed moving to and from a bed to a chair (including a wheelchair)?: None Help needed standing up from a chair using your arms (e.g., wheelchair or bedside chair)?: None Help needed to walk in hospital room?: None Help needed climbing 3-5 steps with a railing? : None 6 Click Score: 24    End of Session Equipment Utilized During Treatment: Gait belt;Cervical collar Activity Tolerance: Patient tolerated treatment well;No increased pain Patient left: in bed;with call bell/phone within reach;with family/visitor present Nurse Communication: Mobility status PT Visit Diagnosis: Unsteadiness on feet (R26.81);Other abnormalities of gait and mobility (R26.89)    Time: 0923-3007 PT Time Calculation (min) (ACUTE ONLY): 11 min   Charges:   PT Evaluation $PT Eval Low Complexity: 1 Low          Cassandra Mcmanaman Allen Kell, PT, DPT   Acute Rehabilitation Department Pager #: [phone]   Otho Bellows 12/01/2020, 9:02 AM

## 2020-12-01 NOTE — Evaluation (Signed)
Occupational Therapy Evaluation/Discharge Patient Details Name: [name] MRN: [id number] DOB: 07/17/1940 Today's Date: 12/01/2020    History of Present Illness The pt is a 80 yo male presenting s/p C4-5 C5-6 and C6-7 ACDF on 7/14 due to progressive weakness in all extremities and gait dysfunction. PMH includes: anxiety, CKD, COPD, HLD, HTN, skin cancer, bilateral TKA, L shoulder surgery, and L THA.   Clinical Impression   PTA, pt lives alone and reports Independence with all daily tasks without use of AD. Pt still works in Patent attorney. Pt presents now with reports of B LE weakness and reports need to use RW at this time. Educated pt on cervical precautions for ADLs/IADLs though pt declined to complete ADLs this AM. Pt was able to demo the functional skills needed to complete ADLs with Modified Independence. Pt reports his significant other will be staying with him to assist while recovering. Pt reports he has a BSC that he could use in shower, but may need a new RW. Pt verbalized understanding of all education with no further skilled OT services needed at this time.     Follow Up Recommendations  No OT follow up    Equipment Recommendations  Other (comment) (Rolling walker)    Recommendations for Other Services       Precautions / Restrictions Precautions Precautions: Fall;Cervical Precaution Booklet Issued: Yes (comment) Required Braces or Orthoses: Cervical Brace Cervical Brace: Soft collar (off for dressing/bathing, can remove in bed) Restrictions Weight Bearing Restrictions: No      Mobility Bed Mobility Overal bed mobility: Modified Independent             General bed mobility comments: cues for keeping spine straight    Transfers                      Balance Overall balance assessment: No apparent balance deficits (not formally assessed) (seated)                                         ADL either performed or  assessed with clinical judgement   ADL Overall ADL's : Modified independent                                       General ADL Comments: Reports feeling legs are weaker and desire to use RW. Educated on cervical precautions with ADLs/IADLs though pt declined to get dressed this AM. Able to cross LEs to reach feet, able to manage soft collar     Vision Patient Visual Report: No change from baseline Vision Assessment?: No apparent visual deficits     Perception     Praxis      Pertinent Vitals/Pain Pain Assessment: No/denies pain     Hand Dominance Right   Extremity/Trunk Assessment Upper Extremity Assessment Upper Extremity Assessment: Overall WFL for tasks assessed (hx of B shoulder replacements though AROM WFL)   Lower Extremity Assessment Lower Extremity Assessment: Defer to PT evaluation   Cervical / Trunk Assessment Cervical / Trunk Assessment: Normal   Communication Communication Communication: No difficulties   Cognition Arousal/Alertness: Awake/alert Behavior During Therapy: WFL for tasks assessed/performed Overall Cognitive Status: Within Functional Limits for tasks assessed  General Comments       Exercises     Shoulder Instructions      Home Living Family/patient expects to be discharged to:: Private residence Living Arrangements: Alone Available Help at Discharge: Family;Available 24 hours/day Type of Home: House Home Access: Stairs to enter [employer] of Steps: 3 Entrance Stairs-Rails: Right;Left Home Layout: Two level;Able to live on main level with bedroom/bathroom Alternate Level Stairs-Number of Steps: flight Alternate Level Stairs-Rails: Right;Left Bathroom Shower/Tub: Occupational psychologist: Handicapped height (with BSC over it)     Home Equipment: Walker - 2 wheels;Bedside commode;Adaptive equipment Adaptive Equipment: Reacher;Long-handled shoe  horn Additional Comments: Significant other of 22 years plans to stay and assist pt as long as needed. Office upstairs, pt plans to work from home      Prior Functioning/Environment Level of Independence: Independent        Comments: Typically independent with ADLs, IADLs and mobility. 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 Has housekeeper for laundry and heavier tasks        OT Problem List:        OT Treatment/Interventions:      OT Goals(Current goals can be found in the care plan section) Acute Rehab OT Goals Patient Stated Goal: go home, get back to work OT Goal Formulation: All assessment and education complete, DC therapy  OT Frequency:     Barriers to D/C:            Co-evaluation              AM-PAC OT "6 Clicks" Daily Activity     Outcome Measure Help from another person eating meals?: None Help from another person taking care of personal grooming?: None Help from another person toileting, which includes using toliet, bedpan, or urinal?: None Help from another person bathing (including washing, rinsing, drying)?: None Help from another person to put on and taking off regular upper body clothing?: None Help from another person to put on and taking off regular lower body clothing?: None 6 Click Score: 24   End of Session Equipment Utilized During Treatment: Cervical collar Nurse Communication: Mobility status  Activity Tolerance: Patient tolerated treatment well Patient left: in bed;with call bell/phone within reach  OT Visit Diagnosis: Other abnormalities of gait and mobility (R26.89)                Time: 7741-4239 OT Time Calculation (min): 16 min Charges:  OT General Charges $OT Visit: 1 Visit OT Evaluation $OT Eval Low Complexity: 1 Low  Malachy Chamber, OTR/L Acute Rehab Services Office: [phone]   Layla [name] 12/01/2020, 8:01 AM

## 2020-12-01 NOTE — Discharge Instructions (Signed)
Wound Care Leave incision open to air. You may shower. Do not scrub directly on incision.  Do not put any creams, lotions, or ointments on incision. Activity Walk each and every day, increasing distance each day. No lifting greater than 5 lbs.  Avoid excessive neck motion. No driving for 2 weeks; may ride as a passenger locally. Wear neck brace at all times except when showering.  If provided soft collar, may wear for comfort unless otherwise instructed. Diet Resume your normal diet.  Return to Work Will be discussed at you follow up appointment. Call Your Doctor If Any of These Occur Redness, drainage, or swelling at the wound.  Temperature greater than 101 degrees. Severe pain not relieved by pain medication. Increased difficulty swallowing. Incision starts to come apart. Follow Up Appt Call [phone])  for problems.  If you have any hardware placed in your spine, you will need an x-ray before your appointment.

## 2020-12-01 NOTE — Progress Notes (Signed)
Patient is discharged from room 3C05 at this time. Alert and in stable condition. IV site d/c'd and instructions read to patient with understanding verbalized and all questions answered. Left unit via wheelchair with all belongings at side.  

## 2020-12-11 DIAGNOSIS — C44729 Squamous cell carcinoma of skin of left lower limb, including hip: Secondary | ICD-10-CM | POA: Diagnosis not present

## 2020-12-19 DIAGNOSIS — G959 Disease of spinal cord, unspecified: Secondary | ICD-10-CM | POA: Diagnosis not present

## 2020-12-26 ENCOUNTER — Other Ambulatory Visit: Payer: Self-pay

## 2020-12-26 ENCOUNTER — Encounter: Payer: Self-pay | Admitting: Adult Health

## 2020-12-26 ENCOUNTER — Ambulatory Visit (INDEPENDENT_AMBULATORY_CARE_PROVIDER_SITE_OTHER): Payer: Medicare Other | Admitting: Adult Health

## 2020-12-26 DIAGNOSIS — I2699 Other pulmonary embolism without acute cor pulmonale: Secondary | ICD-10-CM

## 2020-12-26 DIAGNOSIS — J439 Emphysema, unspecified: Secondary | ICD-10-CM

## 2020-12-26 DIAGNOSIS — I6522 Occlusion and stenosis of left carotid artery: Secondary | ICD-10-CM

## 2020-12-26 DIAGNOSIS — F17210 Nicotine dependence, cigarettes, uncomplicated: Secondary | ICD-10-CM

## 2020-12-26 NOTE — Progress Notes (Signed)
$'@Patient'K$  ID: [name], male    DOB: 08/24/1940, 80 y.o.   MRN: [id number]  Chief Complaint  Patient presents with   Follow-up    Referring provider: Shon Baton, MD  HPI: 80 year old male active smoker followed for COPD with emphysema.  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  12/26/2020 Follow up : COPD with Emphysema  Patient returns for a follow-up visit.  Last seen October 2021.  Patient has underlying COPD with emphysema.  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   Had cervical fusion last month. Was having constant debilitating pain from neck. Neck is so much better since surgery.  Since surgery has had more congestion and cough than usual. Mucus was thick but seems to be getting better. Started on Advair last week and has noticed breathing and cough are better. No fever or discolored mucus.   Patient is very active. Discussed smoking cessation . He is not interested in quitting smoking .  [place] dives on regular basis , Travels extensively .  Works in Personal assistant.    Allergies  Allergen Reactions   Cefaclor Other (See Comments)     skin split   Penicillins Rash    Has patient had a PCN reaction causing immediate rash, facial/tongue/throat swelling, SOB or lightheadedness with hypotension:unsure Has patient had a PCN reaction causing severe rash involving mucus membranes or skin necrosis:unsure Has patient had a PCN reaction that required hospitalization:No Has patient had a PCN reaction occurring within the last 10 years:No--childhood allergy If all of the above answers are "NO", then may proceed with Cephalosporin use.     Immunization History  Administered Date(s) Administered   Influenza Split 02/17/2014   Influenza, High Dose Seasonal PF 01/01/2019, 02/12/2020   Influenza,inj,Quad PF,6+ Mos 01/19/2015,  02/18/2016   Influenza,inj,quad, With Preservative 02/17/2017   PFIZER Comirnaty(Gray Top)Covid-19 Tri-Sucrose Vaccine 09/04/2020   PFIZER(Purple Top)SARS-COV-2 Vaccination 05/31/2019, 06/20/2019, 02/03/2020   Pneumococcal-Unspecified 05/20/2012, 02/17/2017   Zoster, Live 05/20/2012    Past Medical History:  Diagnosis Date   Anxiety    reports scotch helps that    Aortic stenosis, mild 08/18/2018   Mean gradient 14 mmHg 04/2018   Arthritis    " it's everywhere"   Blood transfusion without reported diagnosis    Cataract    having cataract surgery July 2016   Chronic bronchitis ([place])    Chronic kidney disease    hx kidney stones   Colon polyps    Complication of anesthesia    after surgery 07-2013 unable to sleep for 3 days   COPD (chronic obstructive pulmonary disease) ([place])    Coronary artery calcification seen on CAT scan 08/18/2018   GERD (gastroesophageal reflux disease)    History of kidney stones    Hyperlipidemia    Hypertension    Prostate hypertrophy    Pulmonary embolism ([place]) 12/2014   post TJR   Seasonal allergies    Skin cancer    squamous cell, 1 melanoma left shoulder, one episode- under tongue, has had a total of 17-18 episodes    Sleep apnea    had surgery in 2000 for sleep apnea, no CPAP needed   Thoracic aortic aneurysm ([place]) 05/04/2019   4.6 cm 04/2019    Tobacco History: Social History   Tobacco Use  Smoking Status Light Smoker   Types: Cigars  Smokeless  Tobacco Never  Tobacco Comments   pt states he smokes a couple of cigars per day. Never smoke cigarettes.  1 in the afternoon with an adult beverage and one after a meal.   Ready to quit: No Counseling given: Yes Tobacco comments: pt states he smokes a couple of cigars per day. Never smoke cigarettes.  1 in the afternoon with an adult beverage and one after a meal.   Outpatient Medications Prior to Visit  Medication Sig Dispense Refill   acidophilus (RISAQUAD) CAPS capsule Take 1 capsule  by mouth daily.     Camphor-Menthol-Methyl Sal (SALONPAS) 3.05-25-08 % PTCH Apply 1 application topically daily as needed (kneck pain).     cholecalciferol (VITAMIN D) 1000 units tablet Take 1,000 Units by mouth daily.     ELIQUIS 2.5 MG TABS tablet TAKE ONE TABLET TWICE DAILY 60 tablet 3   esomeprazole (NEXIUM) 40 MG capsule Take 40 mg by mouth daily as needed (acid reflux).     fenofibrate (TRICOR) 145 MG tablet Take 145 mg by mouth daily before breakfast.      finasteride (PROSCAR) 5 MG tablet Take 5 mg by mouth daily.     fluticasone (FLONASE) 50 MCG/ACT nasal spray Place 2 sprays into both nostrils daily as needed for allergies or rhinitis.     Fluticasone-Salmeterol (ADVAIR) 100-50 MCG/DOSE AEPB Inhale 1 puff into the lungs 2 (two) times daily as needed (for respiratory issues.).     furosemide (LASIX) 20 MG tablet Take 1 tablet (20 mg total) by mouth daily as needed for edema. 30 tablet 2   loratadine (CLARITIN) 10 MG tablet Take 10 mg by mouth daily as needed for allergies.     losartan-hydrochlorothiazide (HYZAAR) 100-12.5 MG tablet Take 1 tablet by mouth daily.     NON FORMULARY Place 1,000 mg under the tongue daily. CBD Oil     Omega-3 Fatty Acids (FISH OIL) 1000 MG CAPS Take 1,000 mg by mouth daily.     probenecid (BENEMID) 500 MG tablet Take 500 mg by mouth daily before breakfast.      rosuvastatin (CRESTOR) 10 MG tablet Take 10 mg by mouth daily.     sildenafil (VIAGRA) 25 MG tablet Take 25 mg by mouth daily as needed for erectile dysfunction.     tamsulosin (FLOMAX) 0.4 MG CAPS capsule Take 0.4 mg by mouth daily.      dexamethasone (DECADRON) 1 MG tablet 2 tablets twice daily for 2 days, one tablet twice daily for 2 days, one tablet daily for 2 days. (Patient not taking: Reported on 12/26/2020) 15 tablet 0   oxyCODONE-acetaminophen (PERCOCET/ROXICET) 5-325 MG tablet Take 1-2 tablets by mouth every 4 (four) hours as needed for severe pain or moderate pain. (Patient not taking: Reported  on 12/26/2020) 30 tablet 0   tiZANidine (ZANAFLEX) 2 MG tablet Take 1 tablet (2 mg total) by mouth every 6 (six) hours as needed for muscle spasms. (Patient not taking: Reported on 12/26/2020) 30 tablet 0   No facility-administered medications prior to visit.     Review of Systems:   Constitutional:   No  weight loss, night sweats,  Fevers, chills,  +fatigue, or  lassitude.  HEENT:   No headaches,  Difficulty swallowing,  Tooth/dental problems, or  Sore throat,                No sneezing, itching, ear ache, nasal congestion, post nasal drip,   CV:  No chest pain,  Orthopnea, PND, swelling in lower extremities,  anasarca, dizziness, palpitations, syncope.   GI  No heartburn, indigestion, abdominal pain, nausea, vomiting, diarrhea, change in bowel habits, loss of appetite, bloody stools.   Resp: .  No chest wall deformity  Skin: no rash or lesions.  GU: no dysuria, change in color of urine, no urgency or frequency.  No flank pain, no hematuria   MS:  No joint pain or swelling.  No decreased range of motion.  No back pain.    Physical Exam  BP 92/60 (BP Location: Left Arm, Patient Position: Sitting, Cuff Size: Normal)   Pulse 95   Temp 98.5 F (36.9 C) (Oral)   Ht '5\' 9"'$  (1.753 m)   Wt 184 lb 6.4 oz (83.6 kg)   SpO2 98%   BMI 27.23 kg/m   GEN: A/Ox3; pleasant , NAD, well nourished    HEENT:  [place]/AT,   NOSE-clear, THROAT-clear, no lesions, no postnasal drip or exudate noted.   NECK:  Supple w/ fair ROM; no JVD; normal carotid impulses w/o bruits; no thyromegaly or nodules palpated; no lymphadenopathy.    RESP  Clear  P & A; w/o, wheezes/ rales/ or rhonchi. no accessory muscle use, no dullness to percussion  CARD:  RRR, no m/r/g, tr peripheral edema, pulses intact, no cyanosis or clubbing.  GI:   Soft & nt; nml bowel sounds; no organomegaly or masses detected.   Musco: Warm bil, no deformities or joint swelling noted.   Neuro: alert, no focal deficits noted.    Skin:  Warm, no lesions or rashes    Lab Results:  CBC  No results found for: BNP  ProBNP No results found for: PROBNP  Imaging: DG Cervical Spine 2 or 3 views  Result Date: 11/30/2020 CLINICAL DATA:  Anterior fixation at C4-7. EXAM: CERVICAL SPINE - 2-3 VIEW COMPARISON:  CT 11/08/2020 FINDINGS: Three intraoperative views. The first, labeled 8:55 a.m., demonstrates a localizer over the C3-4 interspace. The second image, 11:19 a.m., demonstrates anterior plate and screw fixation at C4 through C7. Interbody fusion material. The third image is similar. Endotracheal tube. IMPRESSION: Intraoperative imaging of C4-7 fixation. Electronically Signed   By: Abigail Miyamoto M.D.   On: 11/30/2020 13:45      No flowsheet data found.  No results found for: NITRICOXIDE      Assessment & Plan:   COPD with emphysema ([place]) COPD with emphysema.  Mild flare after recent surgery.  Seems to be improving after restarting Advair.  Patient is to continue with mucociliary clearance. Continue with yearly low-dose CT screening program. Continue on Advair twice daily Smoking cessation discussed  Plan  Patient Instructions  Restart Advair 1 puff Twice daily, rinse after use.  Mucinex DM Twice daily  As needed  cough/congestion.  Work on not smoking  Activity as tolerated.  Continue on Eliquis .  LDCT Chest CT in November as planned.  Follow up with Dr. 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  Continue on current regimen.  Smoking greater than 40 pack years Smoking cessation discussed      Rexene Edison, NP 12/26/2020

## 2020-12-26 NOTE — Patient Instructions (Addendum)
Restart Advair 1 puff Twice daily, rinse after use.  Mucinex DM Twice daily  As needed  cough/congestion.  Work on not smoking  Activity as tolerated.  Continue on Eliquis .  LDCT Chest CT in November as planned.  Follow up with Dr. Chase Caller in 6 months and As needed

## 2020-12-26 NOTE — Assessment & Plan Note (Signed)
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  Continue on current regimen.

## 2020-12-26 NOTE — Assessment & Plan Note (Signed)
Smoking cessation discussed 

## 2020-12-26 NOTE — Assessment & Plan Note (Signed)
COPD with emphysema.  Mild flare after recent surgery.  Seems to be improving after restarting Advair.  Patient is to continue with mucociliary clearance. Continue with yearly low-dose CT screening program. Continue on Advair twice daily Smoking cessation discussed  Plan  Patient Instructions  Restart Advair 1 puff Twice daily, rinse after use.  Mucinex DM Twice daily  As needed  cough/congestion.  Work on not smoking  Activity as tolerated.  Continue on Eliquis .  LDCT Chest CT in November as planned.  Follow up with Dr. Chase Caller in 6 months and As needed

## 2021-01-02 DIAGNOSIS — R739 Hyperglycemia, unspecified: Secondary | ICD-10-CM | POA: Diagnosis not present

## 2021-01-02 DIAGNOSIS — N401 Enlarged prostate with lower urinary tract symptoms: Secondary | ICD-10-CM | POA: Diagnosis not present

## 2021-01-02 DIAGNOSIS — E291 Testicular hypofunction: Secondary | ICD-10-CM | POA: Diagnosis not present

## 2021-01-02 DIAGNOSIS — I2510 Atherosclerotic heart disease of native coronary artery without angina pectoris: Secondary | ICD-10-CM | POA: Diagnosis not present

## 2021-01-02 DIAGNOSIS — Z125 Encounter for screening for malignant neoplasm of prostate: Secondary | ICD-10-CM | POA: Diagnosis not present

## 2021-01-02 DIAGNOSIS — I1310 Hypertensive heart and chronic kidney disease without heart failure, with stage 1 through stage 4 chronic kidney disease, or unspecified chronic kidney disease: Secondary | ICD-10-CM | POA: Diagnosis not present

## 2021-01-02 DIAGNOSIS — N183 Chronic kidney disease, stage 3 unspecified: Secondary | ICD-10-CM | POA: Diagnosis not present

## 2021-01-02 DIAGNOSIS — M109 Gout, unspecified: Secondary | ICD-10-CM | POA: Diagnosis not present

## 2021-01-02 DIAGNOSIS — E785 Hyperlipidemia, unspecified: Secondary | ICD-10-CM | POA: Diagnosis not present

## 2021-01-09 DIAGNOSIS — C069 Malignant neoplasm of mouth, unspecified: Secondary | ICD-10-CM | POA: Diagnosis not present

## 2021-01-09 DIAGNOSIS — R82998 Other abnormal findings in urine: Secondary | ICD-10-CM | POA: Diagnosis not present

## 2021-01-09 DIAGNOSIS — I1310 Hypertensive heart and chronic kidney disease without heart failure, with stage 1 through stage 4 chronic kidney disease, or unspecified chronic kidney disease: Secondary | ICD-10-CM | POA: Diagnosis not present

## 2021-01-09 DIAGNOSIS — I2510 Atherosclerotic heart disease of native coronary artery without angina pectoris: Secondary | ICD-10-CM | POA: Diagnosis not present

## 2021-01-09 DIAGNOSIS — I700 Atherosclerosis of aorta: Secondary | ICD-10-CM | POA: Diagnosis not present

## 2021-01-09 DIAGNOSIS — Z1212 Encounter for screening for malignant neoplasm of rectum: Secondary | ICD-10-CM | POA: Diagnosis not present

## 2021-01-09 DIAGNOSIS — Z0000 Encounter for general adult medical examination without abnormal findings: Secondary | ICD-10-CM | POA: Diagnosis not present

## 2021-01-09 DIAGNOSIS — J439 Emphysema, unspecified: Secondary | ICD-10-CM | POA: Diagnosis not present

## 2021-01-09 DIAGNOSIS — F17290 Nicotine dependence, other tobacco product, uncomplicated: Secondary | ICD-10-CM | POA: Diagnosis not present

## 2021-01-09 DIAGNOSIS — N1831 Chronic kidney disease, stage 3a: Secondary | ICD-10-CM | POA: Diagnosis not present

## 2021-01-09 DIAGNOSIS — I6529 Occlusion and stenosis of unspecified carotid artery: Secondary | ICD-10-CM | POA: Diagnosis not present

## 2021-01-09 DIAGNOSIS — M810 Age-related osteoporosis without current pathological fracture: Secondary | ICD-10-CM | POA: Diagnosis not present

## 2021-01-09 DIAGNOSIS — I739 Peripheral vascular disease, unspecified: Secondary | ICD-10-CM | POA: Diagnosis not present

## 2021-01-09 DIAGNOSIS — E785 Hyperlipidemia, unspecified: Secondary | ICD-10-CM | POA: Diagnosis not present

## 2021-01-10 ENCOUNTER — Ambulatory Visit (INDEPENDENT_AMBULATORY_CARE_PROVIDER_SITE_OTHER): Payer: Medicare Other | Admitting: Nurse Practitioner

## 2021-01-10 ENCOUNTER — Encounter: Payer: Self-pay | Admitting: Nurse Practitioner

## 2021-01-10 VITALS — BP 100/50 | HR 90 | Ht 69.0 in | Wt 184.5 lb

## 2021-01-10 DIAGNOSIS — R159 Full incontinence of feces: Secondary | ICD-10-CM

## 2021-01-10 NOTE — Progress Notes (Signed)
Agree with assessment and plan as outlined.  

## 2021-01-10 NOTE — Patient Instructions (Addendum)
If you are age 78 or older, your body mass index should be between 23-30. Your Body mass index is 27.25 kg/m. If this is out of the aforementioned range listed, please consider follow up with your Primary Care Provider. _________________________________________________________  The [facility] providers would like to encourage you to use [facility] to communicate with providers for non-urgent requests or questions.  Due to long hold times on the telephone, sending your provider a message by [facility] may be a faster and more efficient way to get a response.  Please allow 48 business hours for a response.  Please remember that this is for non-urgent requests.   Your provider has requested that you go to the basement level for lab work before leaving today. Press "B" on the elevator. The lab is located at the first door on the left as you exit the elevator.  Due to recent changes in healthcare laws, you may see the results of your imaging and laboratory studies on MyChart before your provider has had a chance to review them.  We understand that in some cases there may be results that are confusing or concerning to you. Not all laboratory results come back in the same time frame and the provider may be waiting for multiple results in order to interpret others.  Please give [place] 48 hours in order for your provider to thoroughly review all the results before contacting the office for clarification of your results.   We will attempt to get a copy of most recent lab work done by your PCP.  START: Calmol 4 Suppository per rectum at bedtime.  You can purchase online at [employer].  Please reduce alcohol and caffeine intake.  Please try to have a small evening snack.  You will follow up with our office on an as needed basis.  Thank you for entrusting me with your care and choosing [facility].  Carl Best, NP

## 2021-01-10 NOTE — Progress Notes (Addendum)
01/10/2021 [name] [id number] [birth year]   Chief Complaint: Oily rectal fluid leakage   History of Present Illness: [name]. [name] is a 80 year old male with a past medical history of anxiety, arthritis, hypertension, AS, CKD, PE/DVT 12/2014 on Eliquis, coronary artery calcifications per CT, thoracic aortic aneurysm , CAD s/p right endarterectomy 2015, cervical myelopathy s/p cervical decompression 11/2020, liver cyst and colon polyps.   He was last seen by Dr. Havery Moros via virtual visit 12/17/2018 to schedule a colonoscopy due to a history of a large tubulovillous adenomatous polyp in 2016 and an adenomatous colon polyp in 2017 and a few benign polyps 2019.  He underwent a colonoscopy 01/09/2019 which identified 3 tubular adenomatous/hyperplastic polyps which were removed from the transverse colon and rectum.  Diverticulosis to the right and left colon internal hemorrhoids were noted.  He was advised to repeat a colonoscopy in 5 years if appropriate at that time he is 80 years old.   He presents to our office today for further evaluation regarding orange-colored oily fluid leakage from the rectum.  He stated there is no stool matter in this fluid.  He has awakened on 5 occasions soiled with the anorectal fluid and he had 1 episode when he was walking to the shower when he released similar anorectal fluid without passing gas.  No rectal bleeding or obvious hemorrhoidal inflammation or prolapse.  No constipation or straining.  He continues to travel for work and he is concerned that he will leak rectal fluid during his time of travel which is worrisome.  He typically passes 1 or 2 soft to solid brown stools daily.  His stools often float.  He intentionally keeps his weight around 185 pounds as many years ago he weighed 250 pounds and he does not wish to regain any weight back.  He eats breakfast and lunch, he does not eat a dinner meal.  He drinks 2 to 3 ounces of scotch liquor every evening and 1  glass of wine.  He drinks 16 ounces of coffee daily.  No fever, sweats or chills.  No other complaints today.  Laboratory studies 11/27/2020 which were obtained during his hospital admission for cervical disc fusion surgery: WBC 9.3.  Hemoglobin 15.4.  Hematocrit 48.0.  Platelet 162.  Glucose 129.  BUN 26.  Creatinine 1.60.  Sodium 137.  Potassium 3.7.  He underwent an annual physical with Dr. Shon Baton yesterday, no significant abnormalities were identified on exam.  He underwent laboratory studies 1 week ago which he reported were normal and I have requested a copy of these results for further review.  He has known BPH and is scheduled to see his urologist tomorrow.  Colonoscopy history: 10/11/14 - 4cm tubulovillous adenoma removed per Dr. Deatra Ina, removed in piecemeal 12/12/15 - 48m cecal adenoma removed, otherwise poor prep 11/05/17 - bowel prep again inadequate - 2 x 3-465mpolyps removed - benign on path, non adenomatous / serrated  01/09/2019  colonoscopy -- Two 3 to 4 mm polyps in the transverse colon, removed with a cold snare. Resected and retrieved. retrieved. - One diminutive polyp in the rectum, removed with a cold biopsy forceps. Resected and retrieved. - Diverticulosis in the left colon and in the right colon. - Internal hemorrhoids. - The examination was otherwise normal. Biopsy report: - TUBULAR ADENOMA(S) WITHOUT HIGH-GRADE DYSPLASIA OR MALIGNANCY - HYPERPLASTIC POLYP  EGD 10/12/2013 - NSAID gastritis, very mild esophagitis, no BE  Past Medical History:  Diagnosis Date  Anxiety    reports scotch helps that    Aortic stenosis, mild 08/18/2018   Mean gradient 14 mmHg 04/2018   Arthritis    " it's everywhere"   Blood transfusion without reported diagnosis    Cataract    having cataract surgery July 2016   Chronic bronchitis ([place])    Chronic kidney disease    hx kidney stones   Colon polyps    Complication of anesthesia    after surgery 07-2013 unable to sleep for 3 days    COPD (chronic obstructive pulmonary disease) ([place])    Coronary artery calcification seen on CAT scan 08/18/2018   GERD (gastroesophageal reflux disease)    History of kidney stones    Hyperlipidemia    Hypertension    Prostate hypertrophy    Pulmonary embolism ([place]) 12/2014   post TJR   Seasonal allergies    Skin cancer    squamous cell, 1 melanoma left shoulder, one episode- under tongue, has had a total of 17-18 episodes    Sleep apnea    had surgery in 2000 for sleep apnea, no CPAP needed   Thoracic aortic aneurysm ([place]) 05/04/2019   4.6 cm 04/2019   Current Outpatient Medications on File Prior to Visit  Medication Sig Dispense Refill   acidophilus (RISAQUAD) CAPS capsule Take 1 capsule by mouth daily.     Camphor-Menthol-Methyl Sal (SALONPAS) 3.05-25-08 % PTCH Apply 1 application topically daily as needed (kneck pain).     cholecalciferol (VITAMIN D) 1000 units tablet Take 1,000 Units by mouth daily.     ELIQUIS 2.5 MG TABS tablet TAKE ONE TABLET TWICE DAILY 60 tablet 3   esomeprazole (NEXIUM) 40 MG capsule Take 40 mg by mouth daily as needed (acid reflux).     fenofibrate (TRICOR) 145 MG tablet Take 145 mg by mouth daily before breakfast.      finasteride (PROSCAR) 5 MG tablet Take 5 mg by mouth daily.     fluticasone (FLONASE SENSIMIST) 27.5 MCG/SPRAY nasal spray Place 1 spray into the nose as needed.     furosemide (LASIX) 20 MG tablet Take 1 tablet (20 mg total) by mouth daily as needed for edema. 30 tablet 2   loratadine (CLARITIN) 10 MG tablet Take 10 mg by mouth daily as needed for allergies.     losartan-hydrochlorothiazide (HYZAAR) 50-12.5 MG tablet Take 1 tablet by mouth daily.     NON FORMULARY Place 1,000 mg under the tongue daily. CBD Oil     Omega-3 Fatty Acids (FISH OIL) 1000 MG CAPS Take 1,000 mg by mouth daily.     probenecid (BENEMID) 500 MG tablet Take 500 mg by mouth daily before breakfast.      rosuvastatin (CRESTOR) 10 MG tablet Take 10 mg by mouth daily.      sildenafil (VIAGRA) 25 MG tablet Take 25 mg by mouth daily as needed for erectile dysfunction.     tamsulosin (FLOMAX) 0.4 MG CAPS capsule Take 0.4 mg by mouth daily.      No current facility-administered medications on file prior to visit.   Allergies  Allergen Reactions   Cefaclor Other (See Comments)     skin split   Penicillins Rash    Has patient had a PCN reaction causing immediate rash, facial/tongue/throat swelling, SOB or lightheadedness with hypotension:unsure Has patient had a PCN reaction causing severe rash involving mucus membranes or skin necrosis:unsure Has patient had a PCN reaction that required hospitalization:No Has patient had a PCN reaction occurring within  the last 10 years:No--childhood allergy If all of the above answers are "NO", then may proceed with Cephalosporin use.    Current Medications, Allergies, Past Medical History, Past Surgical History, Family History and Social History were reviewed in [employer] record.  Review of Systems:   Constitutional: Negative for fever, sweats, chills or weight loss.  Respiratory: Negative for shortness of breath.   Cardiovascular: Negative for chest pain, palpitations and leg swelling.  Gastrointestinal: See HPI.  Musculoskeletal: Negative for back pain or muscle aches.  Neurological: Negative for dizziness, headaches or paresthesias.    Physical Exam: BP (!) 100/50   Pulse 90   Ht 5' 9" (1.753 m)   Wt 184 lb 8 oz (83.7 kg)   BMI 27.25 kg/m  General: 80 year old male in no acute distress. Head: Normocephalic and atraumatic. Eyes: No scleral icterus. Conjunctiva pink . Ears: Normal auditory acuity. Mouth: Dentition intact. No ulcers or lesions.  Lungs: breath sounds clear, slightly diminished throughout. Heart: Regular rate and rhythm. 3/6 systolic murmur.  Abdomen: Soft, nontender and nondistended. No masses or hepatomegaly. Normal bowel sounds x 4 quadrants.  Rectal: No external  hemorrhoids or anorectal abscess. Small internal hemorrhoids without prolapse. No blood or exudate in rectum. Smear of golden colored stool on exam glove. Prostate enlarged as assessed in the left lateral position.  Musculoskeletal: Symmetrical with no gross deformities. Extremities: No edema. Neurological: Alert oriented x 4. No focal deficits.  Psychological: Alert and cooperative. Normal mood and affect  Assessment and Recommendations: 9) 80 year old male with intermittent oily fecal fluid leakage with floating stools.  -Fecal pancreatic elastase level to rule out pancreatic insufficiency  -Calmol4 suppository 1 PR Q HS to dry up anorectal secretions  -Reduce alcohol coffee intake  -Request copy of CBC and CMP from PCP's office done 1 week ago -Further follow up to be determined after the above lab results reviewed  -May take Imodium 1/2 tab at bed time if needed, stop if straining or constipation develops   2) BPH -Proceed with urology appointment tomorrow as scheduled   3) History of colon polyps  -Next colonoscopy due 12/2023 if medically appropriate as he will be age 71 at that time   57) History of PE/DVT on Eliquis    ADDENDUM: Labs 01/02/2021 received: WBC 9.08.  Hemoglobin 14.8.  Hematocrit 44.3.  Platelet 143.  Glucose 97.  BUN 14.  Creatinine 1.22.  Sodium 138.  Potassium 4.1.  Total bili 0.8.  Alk phos 47.  AST 18.  ALT 14.

## 2021-01-11 DIAGNOSIS — N401 Enlarged prostate with lower urinary tract symptoms: Secondary | ICD-10-CM | POA: Diagnosis not present

## 2021-01-11 DIAGNOSIS — R3914 Feeling of incomplete bladder emptying: Secondary | ICD-10-CM | POA: Diagnosis not present

## 2021-01-11 DIAGNOSIS — N139 Obstructive and reflux uropathy, unspecified: Secondary | ICD-10-CM | POA: Diagnosis not present

## 2021-01-12 ENCOUNTER — Other Ambulatory Visit: Payer: Medicare Other

## 2021-01-12 DIAGNOSIS — R159 Full incontinence of feces: Secondary | ICD-10-CM | POA: Diagnosis not present

## 2021-01-17 DIAGNOSIS — C069 Malignant neoplasm of mouth, unspecified: Secondary | ICD-10-CM | POA: Diagnosis not present

## 2021-01-17 DIAGNOSIS — K1370 Unspecified lesions of oral mucosa: Secondary | ICD-10-CM | POA: Diagnosis not present

## 2021-01-17 DIAGNOSIS — H9193 Unspecified hearing loss, bilateral: Secondary | ICD-10-CM | POA: Diagnosis not present

## 2021-01-18 LAB — PANCREATIC ELASTASE, FECAL: Pancreatic Elastase-1, Stool: 134 mcg/g — ABNORMAL LOW

## 2021-02-02 ENCOUNTER — Other Ambulatory Visit: Payer: Self-pay | Admitting: Nurse Practitioner

## 2021-02-02 DIAGNOSIS — M810 Age-related osteoporosis without current pathological fracture: Secondary | ICD-10-CM | POA: Diagnosis not present

## 2021-02-02 MED ORDER — PANCRELIPASE (LIP-PROT-AMYL) 36000-114000 UNITS PO CPEP: ORAL_CAPSULE | ORAL | 3 refills | Status: AC

## 2021-02-05 DIAGNOSIS — R944 Abnormal results of kidney function studies: Secondary | ICD-10-CM

## 2021-02-07 DIAGNOSIS — L570 Actinic keratosis: Secondary | ICD-10-CM | POA: Diagnosis not present

## 2021-02-09 ENCOUNTER — Other Ambulatory Visit (INDEPENDENT_AMBULATORY_CARE_PROVIDER_SITE_OTHER): Payer: Medicare Other

## 2021-02-09 DIAGNOSIS — R944 Abnormal results of kidney function studies: Secondary | ICD-10-CM | POA: Diagnosis not present

## 2021-02-09 LAB — BASIC METABOLIC PANEL
BUN: 18 mg/dL (ref 6–23)
CO2: 29 mEq/L (ref 19–32)
Calcium: 9.1 mg/dL (ref 8.4–10.5)
Chloride: 102 mEq/L (ref 96–112)
Creatinine, Ser: 1.55 mg/dL — ABNORMAL HIGH (ref 0.40–1.50)
GFR: 42.57 mL/min — ABNORMAL LOW (ref >60.00)
Glucose, Bld: 163 mg/dL — ABNORMAL HIGH (ref 70–99)
Potassium: 3.7 mEq/L (ref 3.5–5.1)
Sodium: 139 mEq/L (ref 135–145)

## 2021-02-12 ENCOUNTER — Other Ambulatory Visit: Payer: Self-pay

## 2021-02-12 DIAGNOSIS — K8689 Other specified diseases of pancreas: Secondary | ICD-10-CM

## 2021-02-12 DIAGNOSIS — R159 Full incontinence of feces: Secondary | ICD-10-CM

## 2021-02-12 NOTE — Progress Notes (Signed)
See follow up BMP  02/09/2021 lab notes, patient to proceed with abd MRI/MRCP as ordered by Dr. Havery Moros, patient has follow up appt with Dr. Havery Moros 04/02/2021.

## 2021-02-13 ENCOUNTER — Telehealth: Payer: Self-pay | Admitting: Nurse Practitioner

## 2021-02-13 ENCOUNTER — Telehealth: Payer: Self-pay

## 2021-02-13 ENCOUNTER — Other Ambulatory Visit: Payer: Self-pay

## 2021-02-13 DIAGNOSIS — K8689 Other specified diseases of pancreas: Secondary | ICD-10-CM

## 2021-02-13 DIAGNOSIS — R159 Full incontinence of feces: Secondary | ICD-10-CM

## 2021-02-13 NOTE — Telephone Encounter (Signed)
This has been addressed, please see alternate telephone encounter from today.

## 2021-02-13 NOTE — Telephone Encounter (Signed)
Spoke with patient, advised that we have placed new order for MRCP at [facility]. Pt has the number to [facility] and will call to set up his appt. Pt had no concerns at the end of the call.

## 2021-02-13 NOTE — Telephone Encounter (Signed)
Thank you for taking care of this, [name].

## 2021-02-13 NOTE — Telephone Encounter (Signed)
From: Maryann Conners, [place]  Sent: 02/13/2021  12:03 PM EDT  To: April H Pait, Roosvelt Maser  Subject: RE: MRCP                                       FYI..     02/13/21- Pt called and stated that he has to have the MRI sched at [facility] and said that he was going to reach out the ordering MD to have the order changed and contact [facility] later to have his appt sched .Marland Kitchen     Ellison Hughs

## 2021-02-13 NOTE — Telephone Encounter (Signed)
From: Maryann Conners, [place]  Sent: 02/13/2021   9:12 AM EDT  To: April H Pait, Roosvelt Maser, *  Subject: RE: MRCP                                       FYI...   02/13/21- Pt stated that he will contact the office back later today once he gets back in town .Marland Kitchen     Ellison Hughs   ----- Message -----  From: Belva Chimes H  Sent: 02/12/2021   3:15 PM EDT  To: Maryann Conners, NT  Subject: FW: MRCP                                        ----- Message -----  From: Yevette Edwards, RN  Sent: 02/12/2021   2:55 PM EDT  To: April H Pait, Roosvelt Maser  Subject: MRCP                                           Please schedule patient for MRCP at [facility]. The order is in epic. Thanks

## 2021-02-24 DIAGNOSIS — Z23 Encounter for immunization: Secondary | ICD-10-CM | POA: Diagnosis not present

## 2021-02-28 ENCOUNTER — Other Ambulatory Visit: Payer: Self-pay

## 2021-02-28 ENCOUNTER — Telehealth: Payer: Self-pay | Admitting: Gastroenterology

## 2021-02-28 ENCOUNTER — Ambulatory Visit
Admission: RE | Admit: 2021-02-28 | Discharge: 2021-02-28 | Disposition: A | Discharge status: Home / Self Care | Payer: Medicare Other | Source: Ambulatory Visit | Attending: Gastroenterology | Admitting: Gastroenterology

## 2021-02-28 DIAGNOSIS — K8689 Other specified diseases of pancreas: Secondary | ICD-10-CM

## 2021-02-28 DIAGNOSIS — R159 Full incontinence of feces: Secondary | ICD-10-CM

## 2021-02-28 DIAGNOSIS — H34231 Retinal artery branch occlusion, right eye: Secondary | ICD-10-CM | POA: Diagnosis not present

## 2021-02-28 NOTE — Telephone Encounter (Signed)
Spoke with the patient. He did go to the appointment. He "could not do it. It was too closed in." States "extremely claustrophobic." Offered anti-anxiety medication. Declines stating that will not help.  Patient was scheduled with the open-bore MRI. It is the only open MRI available. He will return to [place] to see Dr Havery Moros 04/02/21.  Please advise.

## 2021-02-28 NOTE — Telephone Encounter (Signed)
Inbound call from pt requesting a call back stating that his MRI needs to be rescheduled with a unit that has open sides. Pt is claustrophobic.Please advise. Thank you.

## 2021-03-06 NOTE — Telephone Encounter (Signed)
Beth I am confused, I thought he was already referred for an open MRI? If he can't tolerate the open MRI then will discuss other options with him. We can consider another CT scan of his pancreas which may be reasonable. I am scheduled to see him within a month in the office, I can discuss further with him at that time. If he is losing weight or feeling poorly in the interim please let me know.

## 2021-03-06 NOTE — Telephone Encounter (Signed)
Patient informed of my findings. He will call to schedule the MRI/MRCP.

## 2021-03-06 NOTE — Telephone Encounter (Signed)
Inbound call from patient. He states he need a facility that is open and [facility] on [address] does not have an open machine. Best contact number [phone]

## 2021-03-06 NOTE — Telephone Encounter (Signed)
Could you address this issue, please?  The imaging was to evaluate the pancreas.

## 2021-03-06 NOTE — Telephone Encounter (Signed)
Called the patient to advise him of this plan. He reports he had an MRI at the [facility] in June of this year. He expresses his desire for [place] "to get this right." Talking right now is not a good time because he is with friends at [employer]. He asks me to call him back in an hour, which I will try to do. I called the Radiology scheduling at [place]. They do not have an MRI machine at [facility]. There is not a record of an MRI done in June. Confirmed the MRI machine the patient was scheduled for at the [place] location is the "most open machine" that they have. Looked through the Care Everywhere Records. Patient had an MRI of the cervical spine 11/03/20 at [facility]. Order for MRI/MRCP faxed to [facility].

## 2021-03-07 NOTE — Telephone Encounter (Signed)
The patient confirms he has been scheduled for the MRI/MRCP at [place] on 03/20/21 at 7:45 am. His follow up with Dr Havery Moros is 04/02/21.

## 2021-03-08 ENCOUNTER — Ambulatory Visit (HOSPITAL_COMMUNITY): Payer: Medicare Other

## 2021-03-10 ENCOUNTER — Other Ambulatory Visit: Payer: Self-pay | Admitting: Internal Medicine

## 2021-03-17 DIAGNOSIS — Z23 Encounter for immunization: Secondary | ICD-10-CM | POA: Diagnosis not present

## 2021-03-19 DIAGNOSIS — D492 Neoplasm of unspecified behavior of bone, soft tissue, and skin: Secondary | ICD-10-CM | POA: Diagnosis not present

## 2021-03-19 DIAGNOSIS — L570 Actinic keratosis: Secondary | ICD-10-CM | POA: Diagnosis not present

## 2021-03-19 DIAGNOSIS — C44729 Squamous cell carcinoma of skin of left lower limb, including hip: Secondary | ICD-10-CM | POA: Diagnosis not present

## 2021-03-19 DIAGNOSIS — D225 Melanocytic nevi of trunk: Secondary | ICD-10-CM | POA: Diagnosis not present

## 2021-03-19 DIAGNOSIS — Z85820 Personal history of malignant melanoma of skin: Secondary | ICD-10-CM | POA: Diagnosis not present

## 2021-03-19 DIAGNOSIS — L819 Disorder of pigmentation, unspecified: Secondary | ICD-10-CM | POA: Diagnosis not present

## 2021-03-19 DIAGNOSIS — Z85828 Personal history of other malignant neoplasm of skin: Secondary | ICD-10-CM | POA: Diagnosis not present

## 2021-03-19 DIAGNOSIS — Z08 Encounter for follow-up examination after completed treatment for malignant neoplasm: Secondary | ICD-10-CM | POA: Diagnosis not present

## 2021-03-19 DIAGNOSIS — L821 Other seborrheic keratosis: Secondary | ICD-10-CM | POA: Diagnosis not present

## 2021-03-19 DIAGNOSIS — L814 Other melanin hyperpigmentation: Secondary | ICD-10-CM | POA: Diagnosis not present

## 2021-03-20 DIAGNOSIS — K8689 Other specified diseases of pancreas: Secondary | ICD-10-CM | POA: Diagnosis not present

## 2021-04-02 ENCOUNTER — Ambulatory Visit: Payer: Medicare Other | Admitting: Gastroenterology

## 2021-04-05 DIAGNOSIS — C44729 Squamous cell carcinoma of skin of left lower limb, including hip: Secondary | ICD-10-CM | POA: Diagnosis not present

## 2021-04-05 DIAGNOSIS — I872 Venous insufficiency (chronic) (peripheral): Secondary | ICD-10-CM | POA: Diagnosis not present

## 2021-04-10 DIAGNOSIS — H903 Sensorineural hearing loss, bilateral: Secondary | ICD-10-CM | POA: Diagnosis not present

## 2021-04-10 DIAGNOSIS — C069 Malignant neoplasm of mouth, unspecified: Secondary | ICD-10-CM | POA: Diagnosis not present

## 2021-04-10 DIAGNOSIS — H9193 Unspecified hearing loss, bilateral: Secondary | ICD-10-CM | POA: Diagnosis not present

## 2021-04-10 DIAGNOSIS — K1370 Unspecified lesions of oral mucosa: Secondary | ICD-10-CM | POA: Diagnosis not present

## 2021-04-11 DIAGNOSIS — M799 Soft tissue disorder, unspecified: Secondary | ICD-10-CM | POA: Insufficient documentation

## 2021-04-11 DIAGNOSIS — M810 Age-related osteoporosis without current pathological fracture: Secondary | ICD-10-CM | POA: Insufficient documentation

## 2021-04-17 ENCOUNTER — Encounter: Payer: Self-pay | Admitting: Nurse Practitioner

## 2021-04-17 ENCOUNTER — Other Ambulatory Visit: Payer: Self-pay

## 2021-04-17 ENCOUNTER — Ambulatory Visit (INDEPENDENT_AMBULATORY_CARE_PROVIDER_SITE_OTHER): Payer: Medicare Other | Admitting: Nurse Practitioner

## 2021-04-17 DIAGNOSIS — K8689 Other specified diseases of pancreas: Secondary | ICD-10-CM | POA: Diagnosis not present

## 2021-04-17 DIAGNOSIS — I6522 Occlusion and stenosis of left carotid artery: Secondary | ICD-10-CM | POA: Diagnosis not present

## 2021-04-17 NOTE — Progress Notes (Signed)
Agree with assessment and plan as outlined.  

## 2021-04-17 NOTE — Progress Notes (Signed)
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  He is followed by Dr. Havery Moros.  I last saw Mr. Corvera in the office 01/10/2021 with complaints of oily leakage from the rectum.  A pancreatic elastase level showed a he had pancreatic insufficiency.  He was started on Creon and his bowel movements became firmer and the oily fecal leakage completely abated.  He is passing a solid brown formed stool daily.  No rectal bleeding or black stools.  The out-of-pocket cost for his Creon RX was $3000 therefore he reduced the Creon to once daily which was well-tolerated without recurrence of his prior symptoms.  He eats 2 meals daily.  No weight loss, he has gained 7 to 8 pounds over the past 3 months. He underwent an open abdominal MRI/MRCP with and without contrast at [facility] 03/20/2021 which showed a normal pancreas.  A few small liver cysts were noted, the largest measuring 2.3 x 1.4 cm.  Bilateral adrenal adenomas were stable when compared to prior imaging. He has chronic back pain.  No other complaints today.  CBC Latest Ref Rng & Units 11/27/2020 12/04/2019 11/25/2019  WBC 4.0 - 10.5 K/uL 9.3 - 11.3(H)  Hemoglobin 13.0 - 17.0 g/dL 15.4 14.1 15.4  Hematocrit 39.0 - 52.0 % 48.0 44.1 46.5  Platelets 150 - 400 K/uL 162 - 153    CMP Latest Ref Rng & Units 02/09/2021 11/27/2020 12/04/2019  Glucose 70 - 99 mg/dL 163(H) 129(H) 148(H)  BUN 6 - 23 mg/dL 18 26(H) 25(H)  Creatinine 0.40 - 1.50 mg/dL 1.55(H) 1.60(H) 1.14  Sodium 135 - 145 mEq/L 139 137 136  Potassium 3.5 - 5.1 mEq/L 3.7 3.7 4.6  Chloride 96 -  112 mEq/L 102 102 102  CO2 19 - 32 mEq/L 29 26 27   Calcium 8.4 - 10.5 mg/dL 9.1 9.8 9.1  Total Protein 6.5 - 8.1 g/dL - - -  Total Bilirubin 0.3 - 1.2 mg/dL - - -  Alkaline Phos 38 - 126 U/L - - -  AST 15 - 41 U/L - - -  ALT 17 - 63 U/L - - -      Current Outpatient Medications on File Prior to Visit  Medication Sig Dispense Refill   acidophilus (RISAQUAD) CAPS capsule Take 1 capsule by mouth daily.     Camphor-Menthol-Methyl Sal (SALONPAS) 3.05-25-08 % PTCH Apply 1 application topically daily as needed (kneck pain).     cholecalciferol (VITAMIN D) 1000 units tablet Take 1,000 Units by mouth daily.     ELIQUIS 2.5 MG TABS tablet TAKE ONE TABLET TWICE DAILY 60 tablet 3   esomeprazole (NEXIUM) 40 MG capsule Take 40 mg by mouth daily as needed (acid reflux).     fenofibrate (TRICOR) 145 MG tablet Take 145 mg by mouth daily before breakfast.      finasteride (PROSCAR) 5 MG tablet Take 5 mg by mouth daily.     fluticasone (FLONASE SENSIMIST) 27.5 MCG/SPRAY nasal spray Place 1 spray into the nose as needed.     furosemide (LASIX) 20 MG tablet Take 1 tablet (20 mg total) by mouth daily as needed for edema. [place]  tablet 2   lipase/protease/amylase (CREON) 36000 UNITS CPEP capsule Take 2 capsules (72,000 Units total) by mouth 3 (three) times daily with meals. May also take 1 capsule (36,000 Units total) as needed (with snacks). 240 capsule 3   loratadine (CLARITIN) 10 MG tablet Take 10 mg by mouth daily as needed for allergies.     losartan-hydrochlorothiazide (HYZAAR) 50-12.5 MG tablet Take 1 tablet by mouth daily.     NON FORMULARY Place 1,000 mg under the tongue daily. CBD Oil     Omega-3 Fatty Acids (FISH OIL) 1000 MG CAPS Take 1,000 mg by mouth daily.     probenecid (BENEMID) 500 MG tablet Take 500 mg by mouth daily before breakfast.      rosuvastatin (CRESTOR) 10 MG tablet Take 10 mg by mouth daily.     sildenafil (VIAGRA) 25 MG tablet Take 25 mg by mouth daily as needed for erectile dysfunction.      tamsulosin (FLOMAX) 0.4 MG CAPS capsule Take 0.4 mg by mouth daily.      No current facility-administered medications on file prior to visit.   Allergies  Allergen Reactions   Cefaclor Other (See Comments)     skin split   Penicillins Rash    Has patient had a PCN reaction causing immediate rash, facial/tongue/throat swelling, SOB or lightheadedness with hypotension:unsure Has patient had a PCN reaction causing severe rash involving mucus membranes or skin necrosis:unsure Has patient had a PCN reaction that required hospitalization:No Has patient had a PCN reaction occurring within the last 10 years:No--childhood allergy If all of the above answers are "NO", then may proceed with Cephalosporin use.     Current Medications, Allergies, Past Medical History, Past Surgical History, Family History and Social History were reviewed in [employer] record.  Review of Systems:   Constitutional: Negative for fever, sweats, chills or weight loss.  Respiratory: Negative for shortness of breath.   Cardiovascular: + Left swelling.  Gastrointestinal: See HPI.  Musculoskeletal: + Back pain.   Neurological: Negative for dizziness, headaches or paresthesias.   Physical Exam: BP 112/64 (BP Location: Left Arm, Patient Position: Sitting, Cuff Size: Normal)   Pulse 91   Ht 5\' 10"  (1.778 m)   Wt 192 lb 8 oz (87.3 kg)   SpO2 95%   BMI 27.62 kg/m  Wt Readings from Last 3 Encounters:  04/17/21 192 lb 8 oz (87.3 kg)  01/10/21 184 lb 8 oz (83.7 kg)  12/26/20 184 lb 6.4 oz (83.6 kg)    General: 80 year old male in no acute distress. Head: Normocephalic and atraumatic. Eyes: No scleral icterus. Conjunctiva pink . Ears: Normal auditory acuity. Lungs: Clear throughout to auscultation. Heart: Regular rate and rhythm. III/VI systolic murmur.  Abdomen: Soft, nontender and nondistended. No masses or hepatomegaly. Normal bowel sounds x 4 quadrants.  Rectal: Deferred.   Musculoskeletal: Symmetrical with no gross deformities. Extremities: Bilateral lower extremities with 1+ edema Neurological: Alert oriented x 4. No focal deficits.  Psychological: Alert and cooperative. Normal mood and affect  Assessment and Recommendations:  32) 80 year old male with intermittent oily fecal fluid leakage with floating stools.  A pancreatic elastase level identified pancreatic insufficiency. His oily fecal leakage abated after he was prescribed Creon.  He was started on Creon 36K 2 caps bid (he eats 2 meals daily) but due to cost he decreased the Creon to 1 capsule once daily which he has tolerated without recurrence of symptoms.  An open abdominal MRI/MRCP with and without contrast 03/20/2021  showed a normal pancreas.  He has gained 7 to 8 pounds over the past 3 months. -Patient will contact his secondary insurance carrier for an alternative pancreatic enzyme which will hopefully be more cost effective.  In the interim, he will continue Creon 36K 1 capsule p.o. daily which is well-tolerated.  Samples of Creon were provided. -Patient to contact office if looser stools or oily fecal leakage recur -Follow-up with Dr. Havery Moros in 3 to 4 months   2) History of colon polyps  -Next colonoscopy due 12/2023 if medically appropriate as he will be age 36 at that time   82) Several liver cysts confirmed by MRI -No further evaluation required -Recommend hepatic panel with next lab draw with PCP   4) History of PE/DVT on Eliquis   5) Bilateral adrenal adenomas per MRI -Follow-up with PCP for further management/monitoring

## 2021-04-17 NOTE — Patient Instructions (Addendum)
If you are age 78 or older, your body mass index should be between 23-30. Your Body mass index is 27.62 kg/m. If this is out of the aforementioned range listed, please consider follow up with your Primary Care Provider.  The [facility] providers would like to encourage you to use [facility] to communicate with providers for non-urgent requests or questions.  Due to long hold times on the telephone, sending your provider a message by [facility] may be faster and more efficient way to get a response. Please allow 48 business hours for a response.  Please remember that this is for non-urgent requests/questions.  Continue Creon once or twice a day. We have provided you with some samples today.  Follow up with Dr. Havery Moros in 4 months.  It was great seeing you today! Thank you for entrusting me with your care and choosing [facility].  Noralyn Pick, CRNP

## 2021-04-23 ENCOUNTER — Telehealth: Payer: Self-pay | Admitting: Cardiovascular Disease

## 2021-04-23 NOTE — Telephone Encounter (Signed)
Updated orders and returned call to [facility] from [place]!

## 2021-04-23 NOTE — Telephone Encounter (Signed)
[name] from [place] calling to have new order put in for  CT Angio Abd/Pel w/ and/or w/o CT ANGIO CHEST AORTA W/CM & OR WO/CM   Previous order is expired... please advise

## 2021-04-25 DIAGNOSIS — Z08 Encounter for follow-up examination after completed treatment for malignant neoplasm: Secondary | ICD-10-CM | POA: Diagnosis not present

## 2021-04-25 DIAGNOSIS — D485 Neoplasm of uncertain behavior of skin: Secondary | ICD-10-CM | POA: Diagnosis not present

## 2021-04-25 DIAGNOSIS — Z85828 Personal history of other malignant neoplasm of skin: Secondary | ICD-10-CM | POA: Diagnosis not present

## 2021-04-25 DIAGNOSIS — L570 Actinic keratosis: Secondary | ICD-10-CM | POA: Diagnosis not present

## 2021-04-26 DIAGNOSIS — N401 Enlarged prostate with lower urinary tract symptoms: Secondary | ICD-10-CM | POA: Diagnosis not present

## 2021-04-26 DIAGNOSIS — R3914 Feeling of incomplete bladder emptying: Secondary | ICD-10-CM | POA: Diagnosis not present

## 2021-04-26 DIAGNOSIS — N3944 Nocturnal enuresis: Secondary | ICD-10-CM | POA: Diagnosis not present

## 2021-05-08 ENCOUNTER — Other Ambulatory Visit (HOSPITAL_COMMUNITY): Payer: Medicare Other

## 2021-05-15 ENCOUNTER — Other Ambulatory Visit (HOSPITAL_BASED_OUTPATIENT_CLINIC_OR_DEPARTMENT_OTHER): Payer: Self-pay | Admitting: Cardiovascular Disease

## 2021-05-15 ENCOUNTER — Other Ambulatory Visit: Payer: Self-pay

## 2021-05-15 ENCOUNTER — Ambulatory Visit
Admission: RE | Admit: 2021-05-15 | Discharge: 2021-05-15 | Disposition: A | Discharge status: Home / Self Care | Payer: Medicare Other | Source: Ambulatory Visit | Attending: Cardiovascular Disease | Admitting: Cardiovascular Disease

## 2021-05-15 DIAGNOSIS — I700 Atherosclerosis of aorta: Secondary | ICD-10-CM | POA: Diagnosis not present

## 2021-05-15 DIAGNOSIS — I7121 Aneurysm of the ascending aorta, without rupture: Secondary | ICD-10-CM | POA: Diagnosis not present

## 2021-05-15 DIAGNOSIS — I7133 Infrarenal abdominal aortic aneurysm, ruptured: Secondary | ICD-10-CM | POA: Diagnosis not present

## 2021-05-15 DIAGNOSIS — D3500 Benign neoplasm of unspecified adrenal gland: Secondary | ICD-10-CM | POA: Diagnosis not present

## 2021-05-15 MED ORDER — IOPAMIDOL (ISOVUE-370) INJECTION 76%
75.0000 mL | Freq: Once | INTRAVENOUS | Status: AC | PRN
  Administered 2021-05-15: 09:00:00 75 mL via INTRAVENOUS

## 2021-06-19 DIAGNOSIS — H61032 Chondritis of left external ear: Secondary | ICD-10-CM | POA: Diagnosis not present

## 2021-06-19 DIAGNOSIS — D485 Neoplasm of uncertain behavior of skin: Secondary | ICD-10-CM | POA: Diagnosis not present

## 2021-06-19 DIAGNOSIS — Z08 Encounter for follow-up examination after completed treatment for malignant neoplasm: Secondary | ICD-10-CM | POA: Diagnosis not present

## 2021-06-19 DIAGNOSIS — B070 Plantar wart: Secondary | ICD-10-CM | POA: Diagnosis not present

## 2021-06-19 DIAGNOSIS — Z85828 Personal history of other malignant neoplasm of skin: Secondary | ICD-10-CM | POA: Diagnosis not present

## 2021-06-25 ENCOUNTER — Ambulatory Visit (INDEPENDENT_AMBULATORY_CARE_PROVIDER_SITE_OTHER): Payer: Medicare Other | Admitting: Cardiovascular Disease

## 2021-06-25 ENCOUNTER — Other Ambulatory Visit: Payer: Self-pay

## 2021-06-25 DIAGNOSIS — I350 Nonrheumatic aortic (valve) stenosis: Secondary | ICD-10-CM

## 2021-06-25 DIAGNOSIS — I7121 Aneurysm of the ascending aorta, without rupture: Secondary | ICD-10-CM | POA: Diagnosis not present

## 2021-06-25 DIAGNOSIS — I2692 Saddle embolus of pulmonary artery without acute cor pulmonale: Secondary | ICD-10-CM

## 2021-06-25 MED ORDER — METOPROLOL SUCCINATE ER 25 MG PO TB24: 25.0000 mg | ORAL_TABLET | Freq: Every day | ORAL | 3 refills | Status: DC

## 2021-06-25 MED ORDER — LOSARTAN POTASSIUM 50 MG PO TABS: 50.0000 mg | ORAL_TABLET | Freq: Every day | ORAL | 3 refills | Status: DC

## 2021-06-25 NOTE — Assessment & Plan Note (Signed)
He is due for a repeat echo.  He is asymptomatic other than LE edema, which is chronic.

## 2021-06-25 NOTE — Progress Notes (Signed)
Cardiology Office Note   Date:  06/25/2021   ID:  Derek, Bender [birth year], MRN [id number]  PCP:  Derek Baton, MD  Cardiologist:   Derek Latch, MD   No chief complaint on file.    History of Present Illness: [name] is a 81 y.o. male with asymptomatic coronary calcification, moderate aortic stenosis, ascending aortic aneurysm, carotid stenosis status post right CEA, hypertension, emphysema, prior PE, multiple skin cancers, and GI bleed here for follow up. He was initially seen 03/2018 for the evaluation of coronary calcification.  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  He had a follow up chest CT 03/16/18 that revealed atherosclerosis of the aorta and all three coronary vessels including the left main.  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  He was feeling well but didn't get much formal exercise so he was referred for a Lexiscan Myoview 04/2018 that revealed no ischemia.  TID ratio was 1.25.  He was also noted to have a systolic murmur and had an echo that revealed LVEF 48-18%, grade 1 diastolic dysfunction and mild aortic stenosis (mean gradient 14 mmHg).  Repeat echo 04/2019 the mean gradient was 18 mmHg and 19 mmHg 04/2020.  The ascending aorta was 4.6 cm.  At his last appointment he was feeling well.  He had a repeat CTA of the chest 04/2021 which showed a stable ascending aortic aneurysm at 4.5 cm, increased from 4.4 cm previously.  Semiannual imaging was recommended.  His infrarenal aorta was stable at 2.9 cm, and the left internal iliac was 2.4 cm.  He was also noted to have a prior splenic infarct and emphysema.  His main complaint is arthritis.  This limits his ability to exercise.  [place] likes to walk [place] unsteady and afraid of falling. He has been struggling with a productive cough since he had neck surgery 11/2020.  He has clear phleghm.  His LE edema been  stable with lasix.  His BP at home has been <563 systolic when he checks.     Past Medical History:  Diagnosis Date   Anxiety    reports scotch helps that    Aortic stenosis, mild 08/18/2018   Mean gradient 14 mmHg 04/2018   Arthritis    " it's everywhere"   Blood transfusion without reported diagnosis    Cataract    having cataract surgery July 2016   Chronic bronchitis ([place])    Chronic kidney disease    hx kidney stones   Colon polyps    Complication of anesthesia    after surgery 07-2013 unable to sleep for 3 days   COPD (chronic obstructive pulmonary disease) ([place])    Coronary artery calcification seen on CAT scan 08/18/2018   GERD (gastroesophageal reflux disease)    History of kidney stones    Hyperlipidemia    Hypertension    Prostate hypertrophy    Pulmonary embolism ([place]) 12/2014   post TJR   Seasonal allergies    Skin cancer    squamous cell, 1 melanoma left shoulder, one episode- under tongue, has had a total of 17-18 episodes    Sleep apnea    had surgery in 2000 for sleep apnea, no CPAP needed   Thoracic aortic aneurysm 05/04/2019   4.6 cm 04/2019    Past Surgical History:  Procedure Laterality Date  ANTERIOR CERVICAL DECOMP/DISCECTOMY FUSION N/A 11/30/2020   Procedure: Cervical Four-Five, Cervical Five-Six, Cervical Six-Seven Anterior cervical decompression/discectomy/fusion;  Surgeon: Kristeen Miss, MD;  Location: [place];  Service: Neurosurgery;  Laterality: N/A;   CAROTID ENDARTERECTOMY Right 10/01/2013   CE   COLONOSCOPY     ENDARTERECTOMY Right 10/01/2013   Procedure: ENDARTERECTOMY CAROTID-RIGHT;  Surgeon: Rosetta Posner, MD;  Location: [place];  Service: Vascular;  Laterality: Right;   ESOPHAGOGASTRODUODENOSCOPY ENDOSCOPY  10/12/2013   EXCISION ORAL LESION WITH CO2 LASER N/A 09/08/2015   Procedure: EXCISION ORAL LESION WITH CO2 LASER;  Surgeon: Jerrell Belfast, MD;  Location: [facility] OR;  Service: ENT;  Laterality: N/A;   EYE SURGERY Bilateral     cataracts, W/IOL   JOINT REPLACEMENT Right 09/02/2014   Knee   JOINT REPLACEMENT Left 2012   Knee   PATCH ANGIOPLASTY Right 10/01/2013   Procedure: PATCH ANGIOPLASTY USING HEMASHIELD FINESSE PATCH;  Surgeon: Rosetta Posner, MD;  Location: [place];  Service: Vascular;  Laterality: Right;   removal of melonomia Left    shoulder   REPLACEMENT TOTAL KNEE Left    REVERSE SHOULDER ARTHROPLASTY Right 10/11/2016   Procedure: REVERSE RIGHT SHOULDER ARTHROPLASTY;  Surgeon: Netta Cedars, MD;  Location: [place];  Service: Orthopedics;  Laterality: Right;   REVERSE SHOULDER ARTHROPLASTY Left 12/03/2019   Procedure: REVERSE SHOULDER ARTHROPLASTY;  Surgeon: Netta Cedars, MD;  Location: [facility];  Service: Orthopedics;  Laterality: Left;  interscalene block   RHINOPLASTY     1999   RHINOPLASTY     SKIN CANCER EXCISION     squamous cell cancer under tongue   SKIN SURGERY     squamous cell carcinoma removed in various places,melanoma removed from L shoulder   sleep apnea surgery  2000   SPINAL FUSION N/A 11/30/2020   STERIOD INJECTION Left 10/11/2016   Procedure: STEROID INJECTION LEFT SHOULDER;  Surgeon: Netta Cedars, MD;  Location: [place];  Service: Orthopedics;  Laterality: Left;   TOTAL HIP ARTHROPLASTY Left 10/01/2017   Procedure: LEFT TOTAL HIP ARTHROPLASTY ANTERIOR APPROACH;  Surgeon: Gaynelle Arabian, MD;  Location: [facility];  Service: Orthopedics;  Laterality: Left;   TOTAL KNEE ARTHROPLASTY Right 09/02/2014   Procedure: RIGHT TOTAL KNEE ARTHROPLASTY;  Surgeon: Netta Cedars, MD;  Location: [place];  Service: Orthopedics;  Laterality: Right;     Current Outpatient Medications  Medication Sig Dispense Refill   acidophilus (RISAQUAD) CAPS capsule Take 1 capsule by mouth daily.     ADVAIR DISKUS 100-50 MCG/ACT AEPB Inhale 1 puff into the lungs 2 (two) times daily.     Camphor-Menthol-Methyl Sal (SALONPAS) 3.05-25-08 % PTCH Apply 1 application topically daily as needed (kneck pain).     cholecalciferol  (VITAMIN D) 1000 units tablet Take 1,000 Units by mouth daily.     ELIQUIS 2.5 MG TABS tablet TAKE ONE TABLET TWICE DAILY 60 tablet 3   esomeprazole (NEXIUM) 40 MG capsule Take 40 mg by mouth daily as needed (acid reflux).     fenofibrate (TRICOR) 145 MG tablet Take 145 mg by mouth daily before breakfast.      finasteride (PROSCAR) 5 MG tablet Take 5 mg by mouth daily.     fluticasone (FLONASE SENSIMIST) 27.5 MCG/SPRAY nasal spray Place 1 spray into the nose as needed.     furosemide (LASIX) 20 MG tablet Take 20 mg by mouth daily.     lipase/protease/amylase (CREON) 36000 UNITS CPEP capsule Take 2 capsules (72,000 Units total) by mouth 3 (three) times daily with  meals. May also take 1 capsule (36,000 Units total) as needed (with snacks). 240 capsule 3   loratadine (CLARITIN) 10 MG tablet Take 10 mg by mouth daily as needed for allergies.     losartan (COZAAR) 50 MG tablet Take 1 tablet (50 mg total) by mouth daily. 90 tablet 3   metoprolol succinate (TOPROL XL) 25 MG 24 hr tablet Take 1 tablet (25 mg total) by mouth daily. 90 tablet 3   NON FORMULARY Place 1,000 mg under the tongue daily. CBD Oil     Omega-3 Fatty Acids (FISH OIL) 1000 MG CAPS Take 1,000 mg by mouth daily.     probenecid (BENEMID) 500 MG tablet Take 500 mg by mouth daily before breakfast.      rosuvastatin (CRESTOR) 10 MG tablet Take 10 mg by mouth daily.     sildenafil (VIAGRA) 25 MG tablet Take 25 mg by mouth daily as needed for erectile dysfunction.     tamsulosin (FLOMAX) 0.4 MG CAPS capsule Take 0.4 mg by mouth daily.      No current facility-administered medications for this visit.    Allergies:   Cefaclor and Penicillins    Social History:  The patient  reports that he has been smoking cigars. He has never used smokeless tobacco. He reports current alcohol use of about 21.0 standard drinks per week. He reports that he does not use drugs.   Family History:  The patient's family history includes Diverticulosis in his  mother; Heart attack in his father and mother; Heart disease in his father; Hyperlipidemia in his father; Skin cancer in his mother; Varicose Veins in his mother.    ROS:  Please see the history of present illness.   Otherwise, review of systems are positive for none.   All other systems are reviewed and negative.    PHYSICAL EXAM: VS:  BP 110/70 (BP Location: Right Arm, Patient Position: Sitting, Cuff Size: Normal)    Pulse 92    Ht 5\' 10"  (1.778 m)    Wt 184 lb 6.4 oz (83.6 kg)    BMI 26.46 kg/m  , BMI Body mass index is 26.46 kg/m. GENERAL:  Well appearing HEENT: Pupils equal round and reactive, fundi not visualized, oral mucosa unremarkable NECK:  No jugular venous distention, waveform within normal limits, carotid upstroke brisk and symmetric, no bruits LUNGS:  Clear to auscultation bilaterally HEART:  RRR.  PMI not displaced or sustained,S1 and S2 within normal limits, no S3, no S4, no clicks, no rubs, no murmurs ABD:  Flat, positive bowel sounds normal in frequency in pitch, no bruits, no rebound, no guarding, no midline pulsatile mass, no hepatomegaly, no splenomegaly EXT:  2 plus pulses throughout, no edema, no cyanosis no clubbing SKIN:  No rashes no nodules NEURO:  Cranial nerves II through XII grossly intact, motor grossly intact throughout PSYCH:  Cognitively intact, oriented to person place and time  EKG:  EKG is ordered today. The ekg ordered 05/04/19 demonstrates sinus rhythm.  Rate 83 bpm. 12/27/19: Sinus rhythm.  Rate 91 bpm.  PAC with aberrant conduction 2.6/23: Sinus rhythm.  Rate 92 bpm.  PACs  CT-A chest/abdomen 04/2021: IMPRESSION: VASCULAR   1. Similar appearance of fusiform ascending thoracic aortic aneurysm which now measures up to 4.5 cm, previously 4.4 cm. Ascending thoracic aortic aneurysm. Recommend semi-annual imaging followup by CTA or MRA and referral to cardiothoracic surgery if not already obtained. This recommendation follows  2010 ACCF/AHA/AATS/ACR/ASA/SCA/SCAI/SIR/STS/SVM Guidelines for the Diagnosis and Management of Patients With  Thoracic Aortic Disease. Circulation. 2010; 121: W580-D983. Aortic aneurysm NOS (ICD10-I71.9) 2. Coronary and aortic atherosclerosis (ICD10-I70.0). 3. Similar appearing bilobed fusiform ectasia the infrarenal abdominal aorta measuring up to 2.9 cm. 4. Unchanged fusiform aneurysm about the proximal left internal iliac artery which measures up to 2.4 cm.   NON VASCULAR   1. Interval development well-defined, wedge-shaped hypodensity about the inferior pole of the spleen, most compatible with splenic infarction of indeterminate etiology, however given atherosclerotic burden is likely thromboembolic. 2.  Emphysema (ICD10-J43.9).    Carotid Doppler 07/10/2020: 1 to 39% ICA stenosis bilaterally.  Lexiscan Myoview 04/20/18: Nuclear stress EF: 50%. There was no ST segment deviation noted during stress. No T wave inversion was noted during stress. The study is normal. This is a low risk study. The left ventricular ejection fraction is mildly decreased (45-54%). Abnormal TID ratio of 1.25 indicates a possibility of balanced ischemia. Though the specificity of this finding is limited in a pharmacologic stress test compared with an exercise stress test.  Echo 04/2020: 1. Left ventricular ejection fraction, by estimation, is 60 to 65%. The  left ventricle has normal function. The left ventricle has no regional  wall motion abnormalities. There is moderate left ventricular hypertrophy.  Left ventricular diastolic  parameters were normal.   2. Right ventricular systolic function is normal. The right ventricular  size is normal.   3. Left atrial size was moderately dilated.   4. The mitral valve is degenerative. Mild mitral valve regurgitation. No  evidence of mitral stenosis. Moderate mitral annular calcification.   5. The aortic valve is tricuspid. There is moderate calcification of  the  aortic valve. There is moderate thickening of the aortic valve. Aortic  valve regurgitation is mild. Moderate aortic valve stenosis.   6. Aortic dilatation noted. There is mild dilatation of the ascending  aorta, measuring 40 mm.   7. The inferior vena cava is normal in size with greater than 50%  respiratory variability, suggesting right atrial pressure of 3 mmHg.     Recent Labs: 11/27/2020: Hemoglobin 15.4; Platelets 162 02/09/2021: BUN 18; Creatinine, Ser 1.55; Potassium 3.7; Sodium 139    Lipid Panel No results found for: CHOL, TRIG, HDL, CHOLHDL, VLDL, LDLCALC, LDLDIRECT    Wt Readings from Last 3 Encounters:  06/25/21 184 lb 6.4 oz (83.6 kg)  04/17/21 192 lb 8 oz (87.3 kg)  01/10/21 184 lb 8 oz (83.7 kg)      ASSESSMENT AND PLAN:  Aortic stenosis, mild He is due for a repeat echo.  He is asymptomatic other than LE edema, which is chronic.   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) He remains on Eliquis.  Thoracic aortic aneurysm ([facility]) Ascending aorta was 4.5 cm on imaging 04/2021.  Blood pressure is well controlled.  We will stop the HCTZ given that he is taking Lasix every day and his renal function is worsening.  Add metoprolol succinate 25 mg daily to reduce shear stress.  Coronary artery calcification seen on CAT scan Lipids are well controlled.  He is not on aspirin given that he is on Eliquis.  Blood pressure is well controlled.  Continue rosuvastatin.  Obstructive sleep apnea syndrome Continue CPAP.  Hyperlipidemia Lipids remain well controlled on rosuvastatin.  Obesity He was encouraged to increase his exercise.  He is limited due to his arthritis.  We discussed using the pool if possible.    \ Current medicines are reviewed at length with the patient today.  The patient does not have concerns  regarding medicines.  The following changes have been made:  no change  Labs/ tests ordered today include:   Orders Placed This Encounter  Procedures    EKG 12-Lead   ECHOCARDIOGRAM COMPLETE     Disposition:   FU with Hue Frick C. Oval Linsey, MD, [facility] in 1 year    Signed, Aaryn Sermon C. Oval Linsey, MD, [facility]  06/25/2021 12:18 PM    [place]

## 2021-06-25 NOTE — Assessment & Plan Note (Signed)
Lipids are well controlled.  He is not on aspirin given that he is on Eliquis.  Blood pressure is well controlled.  Continue rosuvastatin.

## 2021-06-25 NOTE — Assessment & Plan Note (Signed)
He remains on Eliquis.

## 2021-06-25 NOTE — Assessment & Plan Note (Signed)
He was encouraged to increase his exercise.  He is limited due to his arthritis.  We discussed using the pool if possible.

## 2021-06-25 NOTE — Assessment & Plan Note (Signed)
Ascending aorta was 4.5 cm on imaging 04/2021.  Blood pressure is well controlled.  We will stop the HCTZ given that he is taking Lasix every day and his renal function is worsening.  Add metoprolol succinate 25 mg daily to reduce shear stress.

## 2021-06-25 NOTE — Assessment & Plan Note (Signed)
Continue CPAP.  

## 2021-06-25 NOTE — Assessment & Plan Note (Signed)
Lipids remain well controlled on rosuvastatin.

## 2021-06-25 NOTE — Patient Instructions (Signed)
Medication Instructions:  STOP LOSARTAN HCT   START LOSARTAN 50 MG DAILY   START METOPROLOL SUCC 25 MG DAILY   *If you need a refill on your cardiac medications before your next appointment, please call your pharmacy*  Lab Work: NONE  Testing/Procedures: Your physician has requested that you have an echocardiogram. Echocardiography is a painless test that uses sound waves to create images of your heart. It provides your doctor with information about the size and shape of your heart and how well your hearts chambers and valves are working. This procedure takes approximately one hour. There are no restrictions for this procedure. H   Follow-Up: At [facility], you and your health needs are our priority.  As part of our continuing mission to provide you with exceptional heart care, we have created designated Provider Care Teams.  These Care Teams include your primary Cardiologist (physician) and Advanced Practice Providers (APPs -  Physician Assistants and Nurse Practitioners) who all work together to provide you with the care you need, when you need it.  We recommend signing up for the patient portal called "MyChart".  Sign up information is provided on this After Visit Summary.  MyChart is used to connect with patients for Virtual Visits (Telemedicine).  Patients are able to view lab/test results, encounter notes, upcoming appointments, etc.  Non-urgent messages can be sent to your provider as well.   To learn more about what you can do with MyChart, go to [email or web address].    Your next appointment:   2 month(s)  The format for your next appointment:   In Person  Provider:   Skeet Latch, MD, Laurann Montana, NP, or Coletta Memos, NP{

## 2021-06-28 ENCOUNTER — Other Ambulatory Visit: Payer: Self-pay

## 2021-06-28 ENCOUNTER — Ambulatory Visit (INDEPENDENT_AMBULATORY_CARE_PROVIDER_SITE_OTHER): Payer: Medicare Other

## 2021-06-28 ENCOUNTER — Encounter (HOSPITAL_BASED_OUTPATIENT_CLINIC_OR_DEPARTMENT_OTHER): Payer: Self-pay | Admitting: Cardiovascular Disease

## 2021-06-28 DIAGNOSIS — I7121 Aneurysm of the ascending aorta, without rupture: Secondary | ICD-10-CM

## 2021-06-28 DIAGNOSIS — I350 Nonrheumatic aortic (valve) stenosis: Secondary | ICD-10-CM

## 2021-06-28 LAB — ECHOCARDIOGRAM COMPLETE
AR max vel: 0.89 cm2
AV Area VTI: 0.91 cm2
AV Area mean vel: 0.78 cm2
AV Mean grad: 22.3 mmHg
AV Peak grad: 36.1 mmHg
Ao pk vel: 3 m/s
Area-P 1/2: 2.8 cm2
Calc EF: 64.5 %
S' Lateral: 2.82 cm
Single Plane A2C EF: 68.5 %
Single Plane A4C EF: 61.1 %

## 2021-07-10 DIAGNOSIS — M48061 Spinal stenosis, lumbar region without neurogenic claudication: Secondary | ICD-10-CM | POA: Diagnosis not present

## 2021-07-10 DIAGNOSIS — M5136 Other intervertebral disc degeneration, lumbar region: Secondary | ICD-10-CM | POA: Diagnosis not present

## 2021-07-10 DIAGNOSIS — M545 Low back pain, unspecified: Secondary | ICD-10-CM | POA: Diagnosis not present

## 2021-07-10 DIAGNOSIS — M4316 Spondylolisthesis, lumbar region: Secondary | ICD-10-CM | POA: Diagnosis not present

## 2021-07-11 DIAGNOSIS — N401 Enlarged prostate with lower urinary tract symptoms: Secondary | ICD-10-CM | POA: Diagnosis not present

## 2021-07-11 DIAGNOSIS — R3914 Feeling of incomplete bladder emptying: Secondary | ICD-10-CM | POA: Diagnosis not present

## 2021-07-11 DIAGNOSIS — N139 Obstructive and reflux uropathy, unspecified: Secondary | ICD-10-CM | POA: Diagnosis not present

## 2021-07-16 DIAGNOSIS — H6983 Other specified disorders of Eustachian tube, bilateral: Secondary | ICD-10-CM | POA: Diagnosis not present

## 2021-07-16 DIAGNOSIS — J302 Other seasonal allergic rhinitis: Secondary | ICD-10-CM | POA: Diagnosis not present

## 2021-07-16 DIAGNOSIS — H9193 Unspecified hearing loss, bilateral: Secondary | ICD-10-CM | POA: Diagnosis not present

## 2021-07-16 DIAGNOSIS — C069 Malignant neoplasm of mouth, unspecified: Secondary | ICD-10-CM | POA: Diagnosis not present

## 2021-07-18 ENCOUNTER — Other Ambulatory Visit: Payer: Self-pay | Admitting: Internal Medicine

## 2021-08-01 DIAGNOSIS — R202 Paresthesia of skin: Secondary | ICD-10-CM | POA: Diagnosis not present

## 2021-08-01 DIAGNOSIS — Z85820 Personal history of malignant melanoma of skin: Secondary | ICD-10-CM | POA: Diagnosis not present

## 2021-08-01 DIAGNOSIS — D0472 Carcinoma in situ of skin of left lower limb, including hip: Secondary | ICD-10-CM | POA: Diagnosis not present

## 2021-08-01 DIAGNOSIS — D492 Neoplasm of unspecified behavior of bone, soft tissue, and skin: Secondary | ICD-10-CM | POA: Diagnosis not present

## 2021-08-01 DIAGNOSIS — Z85828 Personal history of other malignant neoplasm of skin: Secondary | ICD-10-CM | POA: Diagnosis not present

## 2021-08-01 DIAGNOSIS — Z08 Encounter for follow-up examination after completed treatment for malignant neoplasm: Secondary | ICD-10-CM | POA: Diagnosis not present

## 2021-08-01 DIAGNOSIS — C44729 Squamous cell carcinoma of skin of left lower limb, including hip: Secondary | ICD-10-CM | POA: Diagnosis not present

## 2021-08-06 ENCOUNTER — Telehealth (HOSPITAL_BASED_OUTPATIENT_CLINIC_OR_DEPARTMENT_OTHER): Payer: Self-pay | Admitting: *Deleted

## 2021-08-06 ENCOUNTER — Other Ambulatory Visit: Payer: Self-pay | Admitting: *Deleted

## 2021-08-06 DIAGNOSIS — I7121 Aneurysm of the ascending aorta, without rupture: Secondary | ICD-10-CM

## 2021-08-06 NOTE — Telephone Encounter (Signed)
Pt is returning call.  

## 2021-08-06 NOTE — Telephone Encounter (Signed)
-----   Message from Skeet Latch, MD sent at 08/06/2021 11:03 AM EDT ----- ?Aortic aneurysm is stable.  There is plaque in the aorta and in the coronary arteries.  It looks like there has been a blood clot that went to his spleen.  Recommend a 30 day monitor to rule out atrial fibrillation.  Continue Eliquis. ?

## 2021-08-06 NOTE — Telephone Encounter (Signed)
-----   Message from Skeet Latch, MD sent at 08/06/2021 11:04 AM EDT ----- ?Unchanged from prior.  Repeat in 6 months.  ?

## 2021-08-06 NOTE — Telephone Encounter (Signed)
Left message to call back  

## 2021-08-06 NOTE — Telephone Encounter (Signed)
Results called to patient who verbalizes understanding!  ? ?30 day monitor ordered and repeat echo ordered ? ? ? ? ? ?----- Message from Skeet Latch, MD sent at 08/06/2021 11:03 AM EDT ----- ?Aortic aneurysm is stable.  There is plaque in the aorta and in the coronary arteries.  It looks like there has been a blood clot that went to his spleen.  Recommend a 30 day monitor to rule out atrial fibrillation.  Continue Eliquis. ?  ?

## 2021-08-06 NOTE — Telephone Encounter (Signed)
-----   Message from Skeet Latch, MD sent at 08/06/2021 11:07 AM EDT ----- ?Echo shows that his heart is squeezing well.  Moderate aortic stenosis.  No significant change from prior.  ?

## 2021-08-10 ENCOUNTER — Other Ambulatory Visit (HOSPITAL_BASED_OUTPATIENT_CLINIC_OR_DEPARTMENT_OTHER): Payer: Self-pay | Admitting: Cardiovascular Disease

## 2021-08-17 ENCOUNTER — Ambulatory Visit (INDEPENDENT_AMBULATORY_CARE_PROVIDER_SITE_OTHER): Payer: Medicare Other

## 2021-08-17 DIAGNOSIS — I7121 Aneurysm of the ascending aorta, without rupture: Secondary | ICD-10-CM | POA: Diagnosis not present

## 2021-08-17 DIAGNOSIS — I491 Atrial premature depolarization: Secondary | ICD-10-CM

## 2021-08-17 DIAGNOSIS — I4891 Unspecified atrial fibrillation: Secondary | ICD-10-CM | POA: Diagnosis not present

## 2021-08-19 ENCOUNTER — Telehealth: Payer: Self-pay | Admitting: Internal Medicine

## 2021-08-19 NOTE — Telephone Encounter (Signed)
6 Second run Non-sustained VT at 158b pm. Patient called but did not answer phone call. ?

## 2021-08-19 NOTE — Telephone Encounter (Signed)
Received signout from fellow that patient had 6 sec of VT called to him around 5:50am but patient did not answer. I d/w Dr. Gasper Sells who recommended if any symptoms ->ER, but if asymptomatic and feeling well, plan to increase metoprolol given EF normal and no symptoms. I reached patient on the first try who states he has not had any recent symptoms and is feeling great. Last OV BP 110/70, pulse 92. Per recs above, told patient to increase his metoprolol to 2 tablets daily ('50mg'$  total starting this morning). Will route to Dr. Oval Linsey for further review. If she agrees with med increase, will need updated rx sent into pharmacy so that his med rec is updated - will cc to Dr. Blenda Mounts nurse as well so she is aware to call patient to let him know if med rx sent in (I.e would need to clarify with patient whether he wants to continue the '25mg'$  tabs or change to '50mg'$  tablet instead for next rx). ? ?Keyarra Rendall PA-C ? ?

## 2021-08-20 DIAGNOSIS — G4733 Obstructive sleep apnea (adult) (pediatric): Secondary | ICD-10-CM | POA: Diagnosis not present

## 2021-08-20 DIAGNOSIS — I350 Nonrheumatic aortic (valve) stenosis: Secondary | ICD-10-CM | POA: Diagnosis not present

## 2021-08-20 DIAGNOSIS — I700 Atherosclerosis of aorta: Secondary | ICD-10-CM | POA: Diagnosis not present

## 2021-08-20 DIAGNOSIS — J439 Emphysema, unspecified: Secondary | ICD-10-CM | POA: Diagnosis not present

## 2021-08-20 DIAGNOSIS — I1310 Hypertensive heart and chronic kidney disease without heart failure, with stage 1 through stage 4 chronic kidney disease, or unspecified chronic kidney disease: Secondary | ICD-10-CM | POA: Diagnosis not present

## 2021-08-20 DIAGNOSIS — I6529 Occlusion and stenosis of unspecified carotid artery: Secondary | ICD-10-CM | POA: Diagnosis not present

## 2021-08-20 DIAGNOSIS — R739 Hyperglycemia, unspecified: Secondary | ICD-10-CM | POA: Diagnosis not present

## 2021-08-20 DIAGNOSIS — I2510 Atherosclerotic heart disease of native coronary artery without angina pectoris: Secondary | ICD-10-CM | POA: Diagnosis not present

## 2021-08-20 DIAGNOSIS — Z7901 Long term (current) use of anticoagulants: Secondary | ICD-10-CM | POA: Diagnosis not present

## 2021-08-20 DIAGNOSIS — N1831 Chronic kidney disease, stage 3a: Secondary | ICD-10-CM | POA: Diagnosis not present

## 2021-08-20 DIAGNOSIS — I739 Peripheral vascular disease, unspecified: Secondary | ICD-10-CM | POA: Diagnosis not present

## 2021-08-20 DIAGNOSIS — Z86711 Personal history of pulmonary embolism: Secondary | ICD-10-CM | POA: Diagnosis not present

## 2021-08-21 ENCOUNTER — Other Ambulatory Visit (HOSPITAL_BASED_OUTPATIENT_CLINIC_OR_DEPARTMENT_OTHER): Payer: Self-pay | Admitting: *Deleted

## 2021-08-21 DIAGNOSIS — I872 Venous insufficiency (chronic) (peripheral): Secondary | ICD-10-CM | POA: Diagnosis not present

## 2021-08-21 DIAGNOSIS — I7121 Aneurysm of the ascending aorta, without rupture: Secondary | ICD-10-CM

## 2021-08-21 DIAGNOSIS — I7143 Infrarenal abdominal aortic aneurysm, without rupture: Secondary | ICD-10-CM

## 2021-08-21 DIAGNOSIS — I10 Essential (primary) hypertension: Secondary | ICD-10-CM

## 2021-08-21 DIAGNOSIS — D0472 Carcinoma in situ of skin of left lower limb, including hip: Secondary | ICD-10-CM | POA: Diagnosis not present

## 2021-08-21 DIAGNOSIS — C44729 Squamous cell carcinoma of skin of left lower limb, including hip: Secondary | ICD-10-CM | POA: Diagnosis not present

## 2021-08-23 NOTE — Telephone Encounter (Signed)
Dr Oval Linsey reviewed and agreed  ?Advised patient  ?He will just get updated Rx at visit next week, stated he had plenty and has just been taking 2 Metoprolol  ?

## 2021-08-23 NOTE — Progress Notes (Signed)
? ? ?Office Visit  ?  ?Patient Name: [name] ?Date of Encounter: 08/28/2021 ? ?Primary Care Provider:  Shon Baton, MD ?Primary Cardiologist:  Skeet Latch, MD ?Primary Electrophysiologist: None ?Chief Complaint  ?  ?79-monthfollow-up ? ? Patient Profile: ?HTN ?Emphysema ?GI bleed ?LE DVT ?Mild aortic stenosis (mean gradient 14 mmHg) ?Ascending aortic aneurysm ?Carotid stenosis ?Chronic kidney disease ?GERD ? ? Recent Studies: ?06/2021 TTE: EF 60-65%, moderate LVH, no RWMA, grade 1 DD, mod MAC, aortic ascending aneurysm 4.5 cm. ?06/2020 carotid duplex: Right and left ICA with1-39% stenosis ?04/2018 Myoview: No ST deviations during stress. Low risk, normal study. ? ? ?History of Present Illness  ?  ?Derek MCBROOMis a 81y.o. male with PMH of HTN, emphysema, prior saddle PE (2016), mild aortic stenosis, ascending aortic aneurysm (4.5 cm) per coronary CTA, 04/2021.  He was last seen on 2/23 by Dr. ROval Linsey at that time he was started on metoprolol 25 mg to reduce shear stress on aneurysm, and HCTZ was stopped in the setting of taking Lasix daily and worsening renal function.  CT angio of the abdomen was completed 12/27 with findings of stable aortic aneurysm, but blood clot was seen in spleen.  Repeat echo was performed and patient instructed to wear 30-day event monitor to rule out AF.  Patient had 6-second run of nonsustained VT 158 bpm. Metoprolol was increased to 50 mg twice daily. ? ? ?Since last being seen in our clinic [name] reports doing fine and reports that he has not felt any symptoms or abnormal fluttering.  He is compliant with his medications and has not changed anything with his diet since last appointment. He is not exercising currently due to severe arthritis, however he does work from home and climbs stairs frequently daily. He denies new symptoms of chest pain, palpitations, dyspnea, PND, orthopnea, nausea, vomiting, dizziness, syncope, edema, weight gain, or early satiety.  We  discussed the importance of pressing event monitor if symptoms occur. ?Past Medical History  ?  ?Past Medical History:  ?Diagnosis Date  ? Anxiety   ? reports scotch helps that   ? Aortic stenosis, mild 08/18/2018  ? Mean gradient 14 mmHg 04/2018  ? Arthritis   ? " it's everywhere"  ? Blood transfusion without reported diagnosis   ? Cataract   ? having cataract surgery July 2016  ? Chronic bronchitis ([facility]   ? Chronic kidney disease   ? hx kidney stones  ? Colon polyps   ? Complication of anesthesia   ? after surgery 07-2013 unable to sleep for 3 days  ? COPD (chronic obstructive pulmonary disease) ([facility]   ? Coronary artery calcification seen on CAT scan 08/18/2018  ? GERD (gastroesophageal reflux disease)   ? History of kidney stones   ? Hyperlipidemia   ? Hypertension   ? Prostate hypertrophy   ? [place] 12[place]2014  ? post TJR  ? Seasonal allergies   ? Skin cancer   ? squamous cell, 1 melanoma left shoulder, one episode- under tongue, has had a total of 17-18 episodes   ? Sleep apnea   ? had surgery in 2000 for sleep apnea, no CPAP needed  ? Thoracic aortic aneurysm ([facility] 05/04/2019  ? 4.6 cm 04/2019  ? ?Past Surgical History:  ?Procedure Laterality Date  ? ANTERIOR CERVICAL DECOMP/DISCECTOMY FUSION N/A 11/30/2020  ? Procedure: Cervical Four-Five, Cervical Five-Six, Cervical Six-Seven Anterior cervical decompression/discectomy/fusion;  Surgeon: EKristeen Miss MD;  [facility]  Service:  Neurosurgery;  Laterality: N/A;  ? CAROTID ENDARTERECTOMY Right 10/01/2013  ? CE  ? COLONOSCOPY    ? ENDARTERECTOMY Right 10/01/2013  ? Procedure: ENDARTERECTOMY CAROTID-RIGHT;  Surgeon: Rosetta Posner, MD;  Location: [place];  Service: Vascular;  Laterality: Right;  ? ESOPHAGOGASTRODUODENOSCOPY ENDOSCOPY  10/12/2013  ? EXCISION ORAL LESION WITH CO2 LASER N/A 09/08/2015  ? Procedure: EXCISION ORAL LESION WITH CO2 LASER;  Surgeon: Jerrell Belfast, MD;  Location: [place];  Service: ENT;  Laterality: N/A;  ? EYE SURGERY  Bilateral   ? cataracts, W/IOL  ? JOINT REPLACEMENT Right 09/02/2014  ? Knee  ? JOINT REPLACEMENT Left 2012  ? Knee  ? PATCH ANGIOPLASTY Right 10/01/2013  ? Procedure: PATCH ANGIOPLASTY USING HEMASHIELD FINESSE PATCH;  Surgeon: Rosetta Posner, MD;  Location: [facility];  Service: Vascular;  Laterality: Right;  ? removal of melonomia Left   ? shoulder  ? REPLACEMENT TOTAL KNEE Left   ? REVERSE SHOULDER ARTHROPLASTY Right 10/11/2016  ? Procedure: REVERSE RIGHT SHOULDER ARTHROPLASTY;  Surgeon: Netta Cedars, MD;  Location: [facility];  Service: Orthopedics;  Laterality: Right;  ? REVERSE SHOULDER ARTHROPLASTY Left 12/03/2019  ? Procedure: REVERSE SHOULDER ARTHROPLASTY;  Surgeon: Netta Cedars, MD;  Location: [facility];  Service: Orthopedics;  Laterality: Left;  interscalene block  ? RHINOPLASTY    ? 1999  ? RHINOPLASTY    ? SKIN CANCER EXCISION    ? squamous cell cancer under tongue  ? SKIN SURGERY    ? squamous cell carcinoma removed in various places,melanoma removed from L shoulder  ? sleep apnea surgery  2000  ? SPINAL FUSION N/A 11/30/2020  ? STERIOD INJECTION Left 10/11/2016  ? Procedure: STEROID INJECTION LEFT SHOULDER;  Surgeon: Netta Cedars, MD;  Location: [place];  Service: Orthopedics;  Laterality: Left;  ? TOTAL HIP ARTHROPLASTY Left 10/01/2017  ? Procedure: LEFT TOTAL HIP ARTHROPLASTY ANTERIOR APPROACH;  Surgeon: Gaynelle Arabian, MD;  Location: [facility];  Service: Orthopedics;  Laterality: Left;  ? TOTAL KNEE ARTHROPLASTY Right 09/02/2014  ? Procedure: RIGHT TOTAL KNEE ARTHROPLASTY;  Surgeon: Netta Cedars, MD;  Location: [place];  Service: Orthopedics;  Laterality: Right;  ? ?Allergies ? ?Allergies  ?Allergen Reactions  ? Cefaclor Other (See Comments)  ?   skin split  ? Penicillins Rash  ?  Has patient had a PCN reaction causing immediate rash, facial/tongue/throat swelling, SOB or lightheadedness with hypotension:unsure ?Has patient had a PCN reaction causing severe rash involving mucus membranes or skin necrosis:unsure ?Has  patient had a PCN reaction that required hospitalization:No ?Has patient had a PCN reaction occurring within the last 10 years:No--childhood allergy ?If all of the above answers are "NO", then may proceed with Cephalosporin use. ?  ? ? ?Home Medications  ?  ?Current Outpatient Medications  ?Medication Sig Dispense Refill  ? acidophilus (RISAQUAD) CAPS capsule Take 1 capsule by mouth daily.    ? ADVAIR DISKUS 100-50 MCG/ACT AEPB Inhale 1 puff into the lungs 2 (two) times daily.    ? Camphor-Menthol-Methyl Sal (SALONPAS) 3.05-25-08 % PTCH Apply 1 application topically daily as needed (kneck pain).    ? cholecalciferol (VITAMIN D) 1000 units tablet Take 1,000 Units by mouth daily.    ? ELIQUIS 2.5 MG TABS tablet TAKE ONE TABLET TWICE DAILY 60 tablet 0  ? esomeprazole (NEXIUM) 40 MG capsule Take 40 mg by mouth daily as needed (acid reflux).    ? fenofibrate (TRICOR) 145 MG tablet Take 145 mg by mouth daily before breakfast.     ?  finasteride (PROSCAR) 5 MG tablet Take 5 mg by mouth daily.    ? fluticasone (FLONASE SENSIMIST) 27.5 MCG/SPRAY nasal spray Place 1 spray into the nose as needed.    ? furosemide (LASIX) 20 MG tablet TAKE ONE TABLET EACH DAY AS NEEDED FOR EDEMA 30 tablet 2  ? lipase/protease/amylase (CREON) 36000 UNITS CPEP capsule Take 2 capsules (72,000 Units total) by mouth 3 (three) times daily with meals. May also take 1 capsule (36,000 Units total) as needed (with snacks). 240 capsule 3  ? loratadine (CLARITIN) 10 MG tablet Take 10 mg by mouth daily as needed for allergies.    ? losartan (COZAAR) 50 MG tablet Take 1 tablet (50 mg total) by mouth daily. 90 tablet 3  ? metoprolol succinate (TOPROL-XL) 50 MG 24 hr tablet Take 1 tablet (50 mg total) by mouth daily. Take 2 daily 90 tablet 3  ? NON FORMULARY Place 1,000 mg under the tongue daily. CBD Oil    ? Omega-3 Fatty Acids (FISH OIL) 1000 MG CAPS Take 1,000 mg by mouth daily.    ? probenecid (BENEMID) 500 MG tablet Take 500 mg by mouth daily before  breakfast.     ? rosuvastatin (CRESTOR) 10 MG tablet Take 10 mg by mouth daily.    ? sildenafil (VIAGRA) 25 MG tablet Take 25 mg by mouth daily as needed for erectile dysfunction.    ? tamsulosin (FLOMAX) 0.4 MG CAPS ca

## 2021-08-24 ENCOUNTER — Other Ambulatory Visit: Payer: Self-pay | Admitting: Internal Medicine

## 2021-08-28 ENCOUNTER — Encounter (HOSPITAL_BASED_OUTPATIENT_CLINIC_OR_DEPARTMENT_OTHER): Payer: Self-pay | Admitting: Nurse Practitioner

## 2021-08-28 ENCOUNTER — Ambulatory Visit (INDEPENDENT_AMBULATORY_CARE_PROVIDER_SITE_OTHER): Payer: Medicare Other | Admitting: Nurse Practitioner

## 2021-08-28 VITALS — BP 118/72 | HR 73 | Ht 70.0 in | Wt 191.6 lb

## 2021-08-28 DIAGNOSIS — Z86711 Personal history of pulmonary embolism: Secondary | ICD-10-CM | POA: Diagnosis not present

## 2021-08-28 DIAGNOSIS — I7121 Aneurysm of the ascending aorta, without rupture: Secondary | ICD-10-CM

## 2021-08-28 DIAGNOSIS — I472 Ventricular tachycardia, unspecified: Secondary | ICD-10-CM | POA: Diagnosis not present

## 2021-08-28 DIAGNOSIS — I10 Essential (primary) hypertension: Secondary | ICD-10-CM

## 2021-08-28 DIAGNOSIS — E785 Hyperlipidemia, unspecified: Secondary | ICD-10-CM

## 2021-08-28 DIAGNOSIS — I2510 Atherosclerotic heart disease of native coronary artery without angina pectoris: Secondary | ICD-10-CM

## 2021-08-28 DIAGNOSIS — I350 Nonrheumatic aortic (valve) stenosis: Secondary | ICD-10-CM | POA: Diagnosis not present

## 2021-08-28 MED ORDER — METOPROLOL SUCCINATE ER 50 MG PO TB24: 50.0000 mg | ORAL_TABLET | Freq: Every day | ORAL | 3 refills | Status: AC

## 2021-08-28 NOTE — Patient Instructions (Addendum)
Medication Instructions:  ?Your physician has recommended you make the following change in your medication:  ? ?Change: Metoprolol Succinate '50mg'$  once daily  ? ?*If you need a refill on your cardiac medications before your next appointment, please call your pharmacy* ? ?Follow-Up: ?At [facility], you and your health needs are our priority.  As part of our continuing mission to provide you with exceptional heart care, we have created designated Provider Care Teams.  These Care Teams include your primary Cardiologist (physician) and Advanced Practice Providers (APPs -  Physician Assistants and Nurse Practitioners) who all work together to provide you with the care you need, when you need it. ? ?We recommend signing up for the patient portal called "MyChart".  Sign up information is provided on this After Visit Summary.  MyChart is used to connect with patients for Virtual Visits (Telemedicine).  Patients are able to view lab/test results, encounter notes, upcoming appointments, etc.  Non-urgent messages can be sent to your provider as well.   ?To learn more about what you can do with MyChart, go to [email or web address].   ? ?Your next appointment:   ?We will send you a letter to get you scheduled to see Dr. Oval Linsey in February 2024.  ? ? ?Other Instructions ?We will call with your final monitor results in the next few weeks when we get them.  ? ?Important Information About Sugar ? ? ? ? ? ? ?

## 2021-09-03 ENCOUNTER — Telehealth (HOSPITAL_BASED_OUTPATIENT_CLINIC_OR_DEPARTMENT_OTHER): Payer: Self-pay

## 2021-09-03 NOTE — Telephone Encounter (Signed)
? ?  Cardiac Monitor Alert ? ?Date of alert:  09/03/2021  ? ?Patient Name: [name]  ?DOB: [birth year]  ?MRN: [id number]  ? ?[facility] Cardiologist: Skeet Latch, MD  ?[facility] EP:  None   ? ?Monitor Information: ?Cardiac Event Monitor [[employer]]  ?Reason:  To rule out Afib ?Ordering provider:  Dr. Oval Linsey ?  ?Alert ?Atrial Fibrillation/Flutter ?This is the 1st alert for this rhythm.  ?The patient has no hx of Atrial Fibrillation/Flutter.  ?  ?Anticoagulation medication as of 09/03/2021   ? ?    ?  ? ELIQUIS 2.5 MG TABS tablet TAKE ONE TABLET TWICE DAILY  ? ?  ? ? ?Next Cardiology Appointment   ?Date:  Jan, 2024  Provider:  Dr. Oval Linsey ? ?The patient was contacted today.  He is asymptomatic. ?Arrhythmia, symptoms and history reviewed with Dr. Oval Linsey.  Plan:  to continue Eliquis 2.5 mg twice a day and follow up as planned   ? ? ? ?Meryl Crutch, RN  ?09/03/2021 2:28 PM  ? ? ?

## 2021-09-04 DIAGNOSIS — S81802A Unspecified open wound, left lower leg, initial encounter: Secondary | ICD-10-CM | POA: Diagnosis not present

## 2021-09-08 ENCOUNTER — Telehealth: Payer: Self-pay | Admitting: Physician Assistant

## 2021-09-08 NOTE — Telephone Encounter (Signed)
Paged by preventing service after hours.  Patient had 6-second ventricular tachycardia.  It was for 15 beats.  This was at 8:22am central time.  Underlying rhythm was sinus before and after.  I have reached out to patient.  He was eating at restaurant at that time.  Asymptomatic.  Compliant with medications.  ?

## 2021-09-12 ENCOUNTER — Other Ambulatory Visit (HOSPITAL_BASED_OUTPATIENT_CLINIC_OR_DEPARTMENT_OTHER): Payer: Self-pay | Admitting: *Deleted

## 2021-09-13 DIAGNOSIS — S81802A Unspecified open wound, left lower leg, initial encounter: Secondary | ICD-10-CM | POA: Diagnosis not present

## 2021-09-21 ENCOUNTER — Other Ambulatory Visit (HOSPITAL_BASED_OUTPATIENT_CLINIC_OR_DEPARTMENT_OTHER): Payer: Self-pay | Admitting: Cardiovascular Disease

## 2021-09-21 DIAGNOSIS — I491 Atrial premature depolarization: Secondary | ICD-10-CM

## 2021-09-21 DIAGNOSIS — I4891 Unspecified atrial fibrillation: Secondary | ICD-10-CM

## 2021-09-21 DIAGNOSIS — I7121 Aneurysm of the ascending aorta, without rupture: Secondary | ICD-10-CM

## 2021-10-08 DIAGNOSIS — S81802A Unspecified open wound, left lower leg, initial encounter: Secondary | ICD-10-CM | POA: Diagnosis not present

## 2021-10-16 DIAGNOSIS — G4733 Obstructive sleep apnea (adult) (pediatric): Secondary | ICD-10-CM | POA: Diagnosis not present

## 2021-10-16 DIAGNOSIS — H903 Sensorineural hearing loss, bilateral: Secondary | ICD-10-CM | POA: Diagnosis not present

## 2021-10-16 DIAGNOSIS — H6983 Other specified disorders of Eustachian tube, bilateral: Secondary | ICD-10-CM | POA: Diagnosis not present

## 2021-10-16 DIAGNOSIS — F17290 Nicotine dependence, other tobacco product, uncomplicated: Secondary | ICD-10-CM | POA: Diagnosis not present

## 2021-10-16 DIAGNOSIS — Z85818 Personal history of malignant neoplasm of other sites of lip, oral cavity, and pharynx: Secondary | ICD-10-CM | POA: Diagnosis not present

## 2021-10-16 DIAGNOSIS — Z974 Presence of external hearing-aid: Secondary | ICD-10-CM | POA: Diagnosis not present

## 2021-10-18 ENCOUNTER — Telehealth (HOSPITAL_BASED_OUTPATIENT_CLINIC_OR_DEPARTMENT_OTHER): Payer: Self-pay | Admitting: Cardiovascular Disease

## 2021-10-18 NOTE — Telephone Encounter (Signed)
Left message for patient to call and discuss scheduling the CTA chest/aorta and CTA abd ordered by Dr. Oval Linsey

## 2021-10-22 ENCOUNTER — Other Ambulatory Visit (HOSPITAL_BASED_OUTPATIENT_CLINIC_OR_DEPARTMENT_OTHER): Payer: Self-pay | Admitting: *Deleted

## 2021-10-22 ENCOUNTER — Other Ambulatory Visit (HOSPITAL_BASED_OUTPATIENT_CLINIC_OR_DEPARTMENT_OTHER): Payer: Self-pay | Admitting: Cardiovascular Disease

## 2021-10-22 DIAGNOSIS — I10 Essential (primary) hypertension: Secondary | ICD-10-CM

## 2021-10-22 DIAGNOSIS — Z01812 Encounter for preprocedural laboratory examination: Secondary | ICD-10-CM

## 2021-10-22 NOTE — Telephone Encounter (Signed)
Rx request sent to pharmacy.  

## 2021-10-22 NOTE — Telephone Encounter (Signed)
Spoke with patient regarding upcoming CTA appointments at [name] mail information to patient and it is available in My Chart

## 2021-10-24 DIAGNOSIS — L570 Actinic keratosis: Secondary | ICD-10-CM | POA: Diagnosis not present

## 2021-10-24 DIAGNOSIS — D225 Melanocytic nevi of trunk: Secondary | ICD-10-CM | POA: Diagnosis not present

## 2021-10-24 DIAGNOSIS — L821 Other seborrheic keratosis: Secondary | ICD-10-CM | POA: Diagnosis not present

## 2021-10-24 DIAGNOSIS — L0101 Non-bullous impetigo: Secondary | ICD-10-CM | POA: Diagnosis not present

## 2021-10-24 DIAGNOSIS — L814 Other melanin hyperpigmentation: Secondary | ICD-10-CM | POA: Diagnosis not present

## 2021-10-24 DIAGNOSIS — C44229 Squamous cell carcinoma of skin of left ear and external auricular canal: Secondary | ICD-10-CM | POA: Diagnosis not present

## 2021-10-24 DIAGNOSIS — D485 Neoplasm of uncertain behavior of skin: Secondary | ICD-10-CM | POA: Diagnosis not present

## 2021-10-31 ENCOUNTER — Telehealth (HOSPITAL_BASED_OUTPATIENT_CLINIC_OR_DEPARTMENT_OTHER): Payer: Self-pay | Admitting: Cardiovascular Disease

## 2021-10-31 NOTE — Telephone Encounter (Signed)
Spoke with patient and he will go to [place] location for lab work prior to [place]

## 2021-10-31 NOTE — Telephone Encounter (Signed)
New Message:    Patient says he is scheduled for a GI CT on 11-15-21. He said he received a letter that he needs lab work on 11-09-21. He wants to know where does he go for the lab work?kbefore the test.

## 2021-11-09 DIAGNOSIS — Z01812 Encounter for preprocedural laboratory examination: Secondary | ICD-10-CM | POA: Diagnosis not present

## 2021-11-09 DIAGNOSIS — I10 Essential (primary) hypertension: Secondary | ICD-10-CM | POA: Diagnosis not present

## 2021-11-10 LAB — BASIC METABOLIC PANEL
BUN/Creatinine Ratio: 13 (ref 10–24)
BUN: 17 mg/dL (ref 8–27)
CO2: 23 mmol/L (ref 20–29)
Calcium: 9 mg/dL (ref 8.6–10.2)
Chloride: 104 mmol/L (ref 96–106)
Creatinine, Ser: 1.31 mg/dL — ABNORMAL HIGH (ref 0.76–1.27)
Glucose: 94 mg/dL (ref 70–99)
Potassium: 4.3 mmol/L (ref 3.5–5.2)
Sodium: 143 mmol/L (ref 134–144)
eGFR: 55 mL/min/{1.73_m2} — ABNORMAL LOW (ref >59)

## 2021-11-14 ENCOUNTER — Other Ambulatory Visit: Payer: Medicare Other

## 2021-11-14 DIAGNOSIS — S81802A Unspecified open wound, left lower leg, initial encounter: Secondary | ICD-10-CM | POA: Diagnosis not present

## 2021-11-14 DIAGNOSIS — D0422 Carcinoma in situ of skin of left ear and external auricular canal: Secondary | ICD-10-CM | POA: Diagnosis not present

## 2021-11-15 ENCOUNTER — Ambulatory Visit
Admission: RE | Admit: 2021-11-15 | Discharge: 2021-11-15 | Disposition: A | Discharge status: Home / Self Care | Payer: Medicare Other | Source: Ambulatory Visit | Attending: Cardiovascular Disease | Admitting: Cardiovascular Disease

## 2021-11-15 ENCOUNTER — Telehealth: Payer: Self-pay | Admitting: Cardiovascular Disease

## 2021-11-15 DIAGNOSIS — I714 Abdominal aortic aneurysm, without rupture, unspecified: Secondary | ICD-10-CM | POA: Diagnosis not present

## 2021-11-15 DIAGNOSIS — I712 Thoracic aortic aneurysm, without rupture, unspecified: Secondary | ICD-10-CM | POA: Diagnosis not present

## 2021-11-15 DIAGNOSIS — I10 Essential (primary) hypertension: Secondary | ICD-10-CM

## 2021-11-15 DIAGNOSIS — I7143 Infrarenal abdominal aortic aneurysm, without rupture: Secondary | ICD-10-CM

## 2021-11-15 DIAGNOSIS — I723 Aneurysm of iliac artery: Secondary | ICD-10-CM | POA: Diagnosis not present

## 2021-11-15 DIAGNOSIS — I358 Other nonrheumatic aortic valve disorders: Secondary | ICD-10-CM | POA: Diagnosis not present

## 2021-11-15 DIAGNOSIS — I7121 Aneurysm of the ascending aorta, without rupture: Secondary | ICD-10-CM

## 2021-11-15 DIAGNOSIS — N3289 Other specified disorders of bladder: Secondary | ICD-10-CM | POA: Diagnosis not present

## 2021-11-15 MED ORDER — IOPAMIDOL (ISOVUE-370) INJECTION 76%
75.0000 mL | Freq: Once | INTRAVENOUS | Status: AC | PRN
  Administered 2021-11-15: 75 mL via INTRAVENOUS

## 2021-11-15 NOTE — Telephone Encounter (Signed)
[facility] Radiology call with abnormal findings on a CT.

## 2021-11-15 NOTE — Telephone Encounter (Signed)
RN returned call to [facility] radiology,   " CT angio abdomen- impression- unchanged Ascending aortic aneurysm 4.5cm, recommend semi annual follow up, ref to cardiothoracic surgery if not already   Impression 2 also unchanged. Recommend 5 year follow up.   Report being faxed for review, routing telephone encounter to Dr. Oval Linsey as FYI for when report comes in.

## 2021-11-22 ENCOUNTER — Encounter: Payer: Self-pay | Admitting: Internal Medicine

## 2021-11-22 ENCOUNTER — Ambulatory Visit (INDEPENDENT_AMBULATORY_CARE_PROVIDER_SITE_OTHER): Payer: Medicare Other | Admitting: Internal Medicine

## 2021-11-22 VITALS — BP 128/72 | HR 79 | Ht 70.0 in | Wt 188.8 lb

## 2021-11-22 DIAGNOSIS — Z129 Encounter for screening for malignant neoplasm, site unspecified: Secondary | ICD-10-CM | POA: Diagnosis not present

## 2021-11-22 DIAGNOSIS — J439 Emphysema, unspecified: Secondary | ICD-10-CM | POA: Diagnosis not present

## 2021-11-22 DIAGNOSIS — F17210 Nicotine dependence, cigarettes, uncomplicated: Secondary | ICD-10-CM

## 2021-11-22 DIAGNOSIS — Z86711 Personal history of pulmonary embolism: Secondary | ICD-10-CM | POA: Diagnosis not present

## 2021-11-22 MED ORDER — FLUTICASONE-SALMETEROL 100-50 MCG/ACT IN AEPB
1.0000 | INHALATION_SPRAY | Freq: Two times a day (BID) | RESPIRATORY_TRACT | 11 refills | Status: AC
Start: 2021-11-22 — End: ?

## 2021-11-22 NOTE — Patient Instructions (Addendum)
History of pulmonary embolism -  5 years complet this august 2021 - all on full dose eliquis without recurrence - on low dose eliquis since oct 2021 lifelong ( -ongoing risk factor for PE is smoking and varicose veints ) - now needing eliquis for A Fib - seems Dr Oval Linsey ok with the lower dose   Plan  - continue  eliquis to 2.'5mg'$  twice daily  Smoking and cancer screen - June 2023 CT chest without nodules or cancer - understand quitting smoking is not of your interest  Plan  - aim for CT Chest without contrast July 2024 - can decide at followu  COPD  Stable  Plan  - continue advair scheduled with albuterol as needed - flu shot and RSV vaccine in fall  Followup  -9-12 months or sooer

## 2021-11-22 NOTE — Progress Notes (Signed)
OV 02/01/2019  Subjective:  Patient ID: [name], male , DOB: Oct 16, 1940 , age 81 y.o. , MRN: [id number] , ADDRESS: [place] [address]   02/01/2019 -   Chief Complaint  Patient presents with   History of Pulmonary Embolism     ICD-10-CM   1. History of pulmonary embolism  Z86.711   2. Smoking greater than 40 pack years  F17.210   3. Screening for cancer  Z12.9   4. Chronic bronchitis, unspecified chronic bronchitis type [facility])  J42      HPI [name] 81 y.o. -1 year follow-up for the above issues.  Last visit was in June 2019.  He continues to smoke.  But he denies any complaints.  He is frustrated that because of the pandemic and national emergency he has not been able to scuba dive.  In terms of his pulmonary embolism he continues Eliquis at full dose.  At last visit in June 2019 his d-dimer was elevated so we decided to continue his Eliquis at full dose.  It is now 4 years since he had his clot.  He is only had one blood clot so far.  There has been no recurrence.  He is open to retesting for d-dimer and deciding to take lower dose Eliquis.  His last CT scan for lung cancer screening was in October 2019 to was Halloween.  He can have his next scan in November 2020.  He is aware that after age 69 that will be normal lung cancer screening.  He continues his Advair for chronic bronchitis.  He is already had his flu shot with his primary care physician.   Results for [name], Derek "[name]" (MRN [id number]) as of 02/01/2019 09:06  Ref. Range 11/02/2015 09:09 06/18/2016 09:42 11/12/2017 12:53  D-Dimer, America Brown Latest Ref Range: <0.50 mcg/mL FEU 0.33 0.33 0.64 (H)    ROS - per HPI   OV 03/01/2020  Subjective:  Patient ID: [name], male , DOB: Jan 22, 1941 , age 36 y.o. , MRN: [id number] , ADDRESS: [place] [address] PCP Shon Baton, MD Patient Care Team: Shon Baton, MD as PCP - General (Internal Medicine) Brand Males, MD as  Consulting Physician (Pulmonary Disease) Netta Cedars, MD as Consulting Physician (Orthopedic Surgery)  This Provider for this visit: Treatment Team:  Attending Provider: Brand Males, MD    03/01/2020 -   Chief Complaint  Patient presents with   Follow-up    Pt states he has been doing fine since last visit. States he had a water leak in his refrigerator and also had been living in a house full of dust. Due to this, he has been coughing more occ getting up clear phlegm and also has had some wheezing at night.     HPI [name] 81 y.o. -returns for 1 year follow-up.  He tells me that early August 2021 he had flooding in his kitchen and has been 2 months of work which is completed 2 weeks ago.  During this time there was heavy dust exposure inside the house.  Since then his cough is worse.  He thinks his wheezing might be worse a little bit too.  The house is now clean.  Nevertheless he is having worsening cough.  He continues to smoke.  His last cancer screening of the lung CT scan was 1 year ago.  In terms of his pulmonary embolism: We told him to reduce his Eliquis  to half dose.  He called back saying he will do 5 mg once daily along with baby aspirin.  However he is not done that he is not on baby aspirin but he is taking Eliquis at full dose.  At this point in time he is finished 5 years of full dose Eliquis treatment without any recurrence of blood clot.  Last year his D-dimer was normal.  He not had any DVTs or PE or any bleeding episodes.    Results for [name], Derek "[name]" (MRN [id number]) as of 03/01/2020 14:50  Ref. Range 11/02/2015 09:09 06/18/2016 09:42 11/12/2017 12:53 02/01/2019 10:02  D-Dimer, Quant Latest Ref Range: <0.50 mcg/mL FEU 0.33 0.33 0.64 (H) 0.42   ROS - per HPI IMPRESSION: 1. Lung-RADS Category 1, negative. Continue annual screening with low-dose chest CT without contrast in 12 months. 2. Ascending thoracic aortic aneurysm is stable at 4.3 cm  diameter. Recommend annual imaging followup by CTA or MRA. This recommendation follows 2010 ACCF/AHA/AATS/ACR/ASA/SCA/SCAI/SIR/STS/SVM Guidelines for the Diagnosis and Management of Patients with Thoracic Aortic Disease. Circulation. 2010; 121: W413-K440. Aortic aneurysm NOS (ICD10-I71.9) 3. Coronary artery atherosclerosis. 4. Bilateral adrenal adenomas. 5. Emphysema (ICD10-J43.9).     Electronically Signed   By: Misty Stanley M.D.   On: 03/25/2019 14:01  12/26/2020 Follow up : COPD with Emphysema  Patient returns for a follow-up visit.  Last seen October 2021.  Patient has underlying COPD with emphysema.  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   Had cervical fusion last month. Was having constant debilitating pain from neck. Neck is so much better since surgery.  Since surgery has had more congestion and cough than usual. Mucus was thick but seems to be getting better. Started on Advair last week and has noticed breathing and cough are better. No fever or discolored mucus.   Patient is very active. Discussed smoking cessation . He is not interested in quitting smoking .  Scuba dives on regular basis , Travels extensively .  Works in Personal assistant.    OV 11/22/2021  Subjective:  Patient ID: [name], male , DOB: 01-28-1941 , age 16 y.o. , MRN: [id number] , ADDRESS: [place] [id number] PCP Shon Baton, MD Patient Care Team: Shon Baton, MD as PCP - General (Internal Medicine) Skeet Latch, MD as PCP - Cardiology (Cardiology) Brand Males, MD as Consulting Physician (Pulmonary Disease) Netta Cedars, MD as Consulting Physician (Orthopedic Surgery)  This Provider for this visit: Treatment Team:  Attending Provider: Brand Males, MD  #Follow-up history of pulmonary embolism  -  5 years complet this august 2021 - all on full dose eliquis without recurrence - on low dose eliquis since oct 2021 lifelong ( -ongoing  risk factor for PE is smoking and varicose veints ) #Smoking history  - does not wan tto quit #Cancer screening  - last CT Chest June 2023 without nodules #Emphysema/COPD  - on adviar  #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 Denies any complaints except with problems dealing with his arthritis.     HPI NILES ESS 81 y.o. -returns for follow-up.  #Pulmonary embolism.  He continues on low-dose Eliquis for prophylaxis.  In the interim he has developed atrial fibrillation according to his history Dr. Skeet Latch is seen him and now he needs anticoagulation  with Eliquis for atrial fibrillation.  She is happy with his lower dose of Eliquis according to his history.  There are no bleeding episodes no recurrent PE  #COPD: He continues his Advair no issues.  #Lung cancer screening: He had a CT angiogram through Dr. Oval Linsey and this was a CT chest no nodules reported.  #Smoking: He continues to smoke.  He does not want to quit.     CT Chest data  -CTA June 2023 FINDINGS: CTA CHEST FINDINGS   Cardiovascular: Heart size is within normal limits. Mitral and aortic valve annular calcifications are seen. Mid ascending aortic aneurysm measuring up to 4.5 cm is unchanged since the prior examination. No significant dilatation of the aortic arch or descending thoracic aorta.   Mediastinum/Nodes: No enlarged mediastinal, hilar, or axillary lymph nodes. Thyroid gland, trachea, and esophagus demonstrate no significant findings.   Lungs/Pleura: Unchanged linear lung opacities likely due to scarring. Lungs otherwise clear.   Musculoskeletal: Bilateral shoulder prostheses and lower cervical ACDF are partially visualized. No acute osseous abnormality.   Review of the MIP images confirms the above findings         has a past medical history of  Anxiety, Aortic stenosis, mild (08/18/2018), Arthritis, Blood transfusion without reported diagnosis, Cataract, Chronic bronchitis ([place]), Chronic kidney disease, Colon polyps, Complication of anesthesia, COPD (chronic obstructive pulmonary disease) ([place]), Coronary artery calcification seen on CAT scan (08/18/2018), GERD (gastroesophageal reflux disease), History of kidney stones, Hyperlipidemia, Hypertension, Prostate hypertrophy, Pulmonary embolism ([place]) (12/2014), Seasonal allergies, Skin cancer, Sleep apnea, and Thoracic aortic aneurysm ([place]) (05/04/2019).   reports that he has been smoking cigars. He has never used smokeless tobacco.  Past Surgical History:  Procedure Laterality Date   ANTERIOR CERVICAL DECOMP/DISCECTOMY FUSION N/A 11/30/2020   Procedure: Cervical Four-Five, Cervical Five-Six, Cervical Six-Seven Anterior cervical decompression/discectomy/fusion;  Surgeon: Kristeen Miss, MD;  Location: [place];  Service: Neurosurgery;  Laterality: N/A;   CAROTID ENDARTERECTOMY Right 10/01/2013   CE   COLONOSCOPY     ENDARTERECTOMY Right 10/01/2013   Procedure: ENDARTERECTOMY CAROTID-RIGHT;  Surgeon: Rosetta Posner, MD;  Location: [place];  Service: Vascular;  Laterality: Right;   ESOPHAGOGASTRODUODENOSCOPY ENDOSCOPY  10/12/2013   EXCISION ORAL LESION WITH CO2 LASER N/A 09/08/2015   Procedure: EXCISION ORAL LESION WITH CO2 LASER;  Surgeon: Jerrell Belfast, MD;  Location: [facility];  Service: ENT;  Laterality: N/A;   EYE SURGERY Bilateral    cataracts, W/IOL   JOINT REPLACEMENT Right 09/02/2014   Knee   JOINT REPLACEMENT Left 2012   Knee   PATCH ANGIOPLASTY Right 10/01/2013   Procedure: PATCH ANGIOPLASTY USING HEMASHIELD FINESSE PATCH;  Surgeon: Rosetta Posner, MD;  Location: [place];  Service: Vascular;  Laterality: Right;   removal of melonomia Left    shoulder   REPLACEMENT TOTAL KNEE Left    REVERSE SHOULDER ARTHROPLASTY Right 10/11/2016   Procedure: REVERSE RIGHT SHOULDER ARTHROPLASTY;  Surgeon:  Netta Cedars, MD;  Location: [place];  Service: Orthopedics;  Laterality: Right;   REVERSE SHOULDER ARTHROPLASTY Left 12/03/2019   Procedure: REVERSE SHOULDER ARTHROPLASTY;  Surgeon: Netta Cedars, MD;  Location: [facility];  Service: Orthopedics;  Laterality: Left;  interscalene block   RHINOPLASTY     1999   RHINOPLASTY     SKIN CANCER EXCISION     squamous cell cancer under tongue   SKIN SURGERY     squamous cell carcinoma removed in various places,melanoma removed from L shoulder   sleep apnea surgery  2000  SPINAL FUSION N/A 11/30/2020   STERIOD INJECTION Left 10/11/2016   Procedure: STEROID INJECTION LEFT SHOULDER;  Surgeon: Netta Cedars, MD;  Location: [place];  Service: Orthopedics;  Laterality: Left;   TOTAL HIP ARTHROPLASTY Left 10/01/2017   Procedure: LEFT TOTAL HIP ARTHROPLASTY ANTERIOR APPROACH;  Surgeon: Gaynelle Arabian, MD;  Location: [facility];  Service: Orthopedics;  Laterality: Left;   TOTAL KNEE ARTHROPLASTY Right 09/02/2014   Procedure: RIGHT TOTAL KNEE ARTHROPLASTY;  Surgeon: Netta Cedars, MD;  Location: [place];  Service: Orthopedics;  Laterality: Right;    Allergies  Allergen Reactions   Cefaclor Other (See Comments)     skin split   Penicillins Rash    Has patient had a PCN reaction causing immediate rash, facial/tongue/throat swelling, SOB or lightheadedness with hypotension:unsure Has patient had a PCN reaction causing severe rash involving mucus membranes or skin necrosis:unsure Has patient had a PCN reaction that required hospitalization:No Has patient had a PCN reaction occurring within the last 10 years:No--childhood allergy If all of the above answers are "NO", then may proceed with Cephalosporin use.     Immunization History  Administered Date(s) Administered   Influenza Split 02/17/2014   Influenza, High Dose Seasonal PF 01/01/2019, 02/12/2020   Influenza,inj,Quad PF,6+ Mos 01/19/2015, 02/18/2016   Influenza,inj,quad, With Preservative 02/17/2017   PFIZER  Comirnaty(Gray Top)Covid-19 Tri-Sucrose Vaccine 09/04/2020   PFIZER(Purple Top)SARS-COV-2 Vaccination 05/31/2019, 06/20/2019, 02/03/2020   Pneumococcal-Unspecified 05/20/2012, 02/17/2017   Zoster, Live 05/20/2012    Family History  Problem Relation Age of Onset   Diverticulosis Mother    Skin cancer Mother    Varicose Veins Mother    Heart attack Mother    Hyperlipidemia Father    Heart disease Father    Heart attack Father    Colon cancer Neg Hx    Esophageal cancer Neg Hx    Rectal cancer Neg Hx    Stomach cancer Neg Hx      Current Outpatient Medications:    acidophilus (RISAQUAD) CAPS capsule, Take 1 capsule by mouth daily., Disp: , Rfl:    ADVAIR DISKUS 100-50 MCG/ACT AEPB, Inhale 1 puff into the lungs 2 (two) times daily., Disp: , Rfl:    Camphor-Menthol-Methyl Sal (SALONPAS) 3.05-25-08 % PTCH, Apply 1 application topically daily as needed (kneck pain)., Disp: , Rfl:    cholecalciferol (VITAMIN D) 1000 units tablet, Take 1,000 Units by mouth daily., Disp: , Rfl:    ELIQUIS 2.5 MG TABS tablet, TAKE ONE TABLET TWICE DAILY, Disp: 60 tablet, Rfl: 0   esomeprazole (NEXIUM) 40 MG capsule, Take 40 mg by mouth daily as needed (acid reflux)., Disp: , Rfl:    fenofibrate (TRICOR) 145 MG tablet, Take 145 mg by mouth daily before breakfast. , Disp: , Rfl:    finasteride (PROSCAR) 5 MG tablet, Take 5 mg by mouth daily., Disp: , Rfl:    fluticasone (FLONASE SENSIMIST) 27.5 MCG/SPRAY nasal spray, Place 1 spray into the nose as needed., Disp: , Rfl:    furosemide (LASIX) 20 MG tablet, TAKE ONE TABLET EACH DAY AS NEEDED FOR EDEMA, Disp: 30 tablet, Rfl: 2   lipase/protease/amylase (CREON) 36000 UNITS CPEP capsule, Take 2 capsules (72,000 Units total) by mouth 3 (three) times daily with meals. May also take 1 capsule (36,000 Units total) as needed (with snacks)., Disp: 240 capsule, Rfl: 3   loratadine (CLARITIN) 10 MG tablet, Take 10 mg by mouth daily as needed for allergies., Disp: , Rfl:     metoprolol succinate (TOPROL-XL) 50 MG 24 hr tablet,  Take 1 tablet (50 mg total) by mouth daily. Take 2 daily, Disp: 90 tablet, Rfl: 3   NON FORMULARY, Place 1,000 mg under the tongue daily. CBD Oil, Disp: , Rfl:    Omega-3 Fatty Acids (FISH OIL) 1000 MG CAPS, Take 1,000 mg by mouth daily., Disp: , Rfl:    probenecid (BENEMID) 500 MG tablet, Take 500 mg by mouth daily before breakfast. , Disp: , Rfl:    rosuvastatin (CRESTOR) 10 MG tablet, Take 10 mg by mouth daily., Disp: , Rfl:    sildenafil (VIAGRA) 25 MG tablet, Take 25 mg by mouth daily as needed for erectile dysfunction., Disp: , Rfl:    tamsulosin (FLOMAX) 0.4 MG CAPS capsule, Take 0.4 mg by mouth daily. , Disp: , Rfl:    losartan (COZAAR) 50 MG tablet, Take 1 tablet (50 mg total) by mouth daily., Disp: 90 tablet, Rfl: 3      Objective:   Vitals:   11/22/21 1044  BP: 128/72  Pulse: 79  SpO2: 95%  Weight: 188 lb 12.8 oz (85.6 kg)  Height: '5\' 10"'$  (1.778 m)    Estimated body mass index is 27.09 kg/m as calculated from the following:   Height as of this encounter: '5\' 10"'$  (1.778 m).   Weight as of this encounter: 188 lb 12.8 oz (85.6 kg).  '@WEIGHTCHANGE'$ @  [employer]   11/22/21 1044  Weight: 188 lb 12.8 oz (85.6 kg)     Physical Exam    General: No distress. Looks well. Some cig smell Neuro: Alert and Oriented x 3. GCS 15. Speech normal Psych: Pleasant Resp:  Barrel Chest - no.  Wheeze - no, Crackles - no COARSE, No overt respiratory distress CVS: Normal heart sounds. Murmurs - YES Ext: Stigmata of Connective Tissue Disease - no HEENT: Normal upper airway. PEERL +. No post nasal drip        Assessment:       ICD-10-CM   1. Pulmonary emphysema, unspecified emphysema type ([place])  J43.9     2. Smoking greater than 40 pack years  F17.210     3. History of pulmonary embolism  Z86.711     4. Screening for cancer  Z12.9          Plan:     Patient Instructions  History of pulmonary embolism -  5 years  complet this august 2021 - all on full dose eliquis without recurrence - on low dose eliquis since oct 2021 lifelong ( -ongoing risk factor for PE is smoking and varicose veints ) - now needing eliquis for A Fib - seems Dr Oval Linsey ok with the lower dose   Plan  - continue  eliquis to 2.'5mg'$  twice daily  Smoking and cancer screen - June 2023 CT chest without nodules or cancer - understand quitting smoking is not of your interest  Plan  - aim for CT Chest without contrast July 2024 - can decide at followu  COPD  Stable  Plan  - continue advair scheduled with albuterol as needed - flu shot and RSV vaccine in fall  Followup  -9-12 months or sooer    SIGNATURE    Dr. Brand Males, M.D., F.C.C.P,  Pulmonary and Critical Care Medicine Staff Physician, [place] Director - Interstitial Lung Disease  Program  Pulmonary [place] [facility] [place], [place], [address]  Pager: [phone], If no answer or between  15:00h - 7:00h: call [phone] Telephone:  [phone]  11:17 AM 11/22/2021

## 2021-11-23 DIAGNOSIS — D0422 Carcinoma in situ of skin of left ear and external auricular canal: Secondary | ICD-10-CM | POA: Diagnosis not present

## 2021-11-28 DIAGNOSIS — M545 Low back pain, unspecified: Secondary | ICD-10-CM | POA: Diagnosis not present

## 2021-12-04 DIAGNOSIS — R3914 Feeling of incomplete bladder emptying: Secondary | ICD-10-CM | POA: Diagnosis not present

## 2021-12-04 DIAGNOSIS — D414 Neoplasm of uncertain behavior of bladder: Secondary | ICD-10-CM | POA: Diagnosis not present

## 2021-12-04 DIAGNOSIS — N401 Enlarged prostate with lower urinary tract symptoms: Secondary | ICD-10-CM | POA: Diagnosis not present

## 2021-12-05 DIAGNOSIS — Z08 Encounter for follow-up examination after completed treatment for malignant neoplasm: Secondary | ICD-10-CM | POA: Diagnosis not present

## 2021-12-05 DIAGNOSIS — L570 Actinic keratosis: Secondary | ICD-10-CM | POA: Diagnosis not present

## 2021-12-05 DIAGNOSIS — Z85828 Personal history of other malignant neoplasm of skin: Secondary | ICD-10-CM | POA: Diagnosis not present

## 2021-12-05 DIAGNOSIS — L821 Other seborrheic keratosis: Secondary | ICD-10-CM | POA: Diagnosis not present

## 2021-12-21 ENCOUNTER — Telehealth (HOSPITAL_BASED_OUTPATIENT_CLINIC_OR_DEPARTMENT_OTHER): Payer: Self-pay | Admitting: Cardiovascular Disease

## 2021-12-21 NOTE — Telephone Encounter (Signed)
Spoke with patient to discuss scheduling the Echocardiogram ordered by Dr. Mardella Layman states he is out of town and asked that I call back on Monday 12/24/21

## 2021-12-24 DIAGNOSIS — S01302A Unspecified open wound of left ear, initial encounter: Secondary | ICD-10-CM | POA: Diagnosis not present

## 2021-12-25 DIAGNOSIS — N390 Urinary tract infection, site not specified: Secondary | ICD-10-CM | POA: Diagnosis not present

## 2021-12-25 NOTE — Telephone Encounter (Signed)
Left message for patient to call and discuss scheduling the Echocardiogram ordered by Dr. [place] 

## 2021-12-28 DIAGNOSIS — M5459 Other low back pain: Secondary | ICD-10-CM | POA: Diagnosis not present

## 2021-12-28 DIAGNOSIS — M545 Low back pain, unspecified: Secondary | ICD-10-CM | POA: Diagnosis not present

## 2022-01-07 ENCOUNTER — Ambulatory Visit (HOSPITAL_BASED_OUTPATIENT_CLINIC_OR_DEPARTMENT_OTHER): Payer: Medicare Other

## 2022-01-07 ENCOUNTER — Encounter (HOSPITAL_BASED_OUTPATIENT_CLINIC_OR_DEPARTMENT_OTHER): Payer: Self-pay

## 2022-01-07 DIAGNOSIS — L570 Actinic keratosis: Secondary | ICD-10-CM | POA: Diagnosis not present

## 2022-01-08 DIAGNOSIS — M545 Low back pain, unspecified: Secondary | ICD-10-CM | POA: Diagnosis not present

## 2022-01-08 DIAGNOSIS — M48061 Spinal stenosis, lumbar region without neurogenic claudication: Secondary | ICD-10-CM | POA: Diagnosis not present

## 2022-01-14 DIAGNOSIS — N401 Enlarged prostate with lower urinary tract symptoms: Secondary | ICD-10-CM | POA: Diagnosis not present

## 2022-01-14 DIAGNOSIS — R3914 Feeling of incomplete bladder emptying: Secondary | ICD-10-CM | POA: Diagnosis not present

## 2022-01-22 DIAGNOSIS — E559 Vitamin D deficiency, unspecified: Secondary | ICD-10-CM | POA: Diagnosis not present

## 2022-01-22 DIAGNOSIS — Z0000 Encounter for general adult medical examination without abnormal findings: Secondary | ICD-10-CM | POA: Diagnosis not present

## 2022-01-22 DIAGNOSIS — C069 Malignant neoplasm of mouth, unspecified: Secondary | ICD-10-CM | POA: Diagnosis not present

## 2022-01-22 DIAGNOSIS — K1370 Unspecified lesions of oral mucosa: Secondary | ICD-10-CM | POA: Diagnosis not present

## 2022-01-22 DIAGNOSIS — E291 Testicular hypofunction: Secondary | ICD-10-CM | POA: Diagnosis not present

## 2022-01-22 DIAGNOSIS — H9193 Unspecified hearing loss, bilateral: Secondary | ICD-10-CM | POA: Diagnosis not present

## 2022-01-22 DIAGNOSIS — E785 Hyperlipidemia, unspecified: Secondary | ICD-10-CM | POA: Diagnosis not present

## 2022-01-22 DIAGNOSIS — R739 Hyperglycemia, unspecified: Secondary | ICD-10-CM | POA: Diagnosis not present

## 2022-01-22 DIAGNOSIS — R7989 Other specified abnormal findings of blood chemistry: Secondary | ICD-10-CM | POA: Diagnosis not present

## 2022-01-22 DIAGNOSIS — M109 Gout, unspecified: Secondary | ICD-10-CM | POA: Diagnosis not present

## 2022-01-22 DIAGNOSIS — Z125 Encounter for screening for malignant neoplasm of prostate: Secondary | ICD-10-CM | POA: Diagnosis not present

## 2022-01-24 ENCOUNTER — Other Ambulatory Visit (HOSPITAL_BASED_OUTPATIENT_CLINIC_OR_DEPARTMENT_OTHER): Payer: Self-pay | Admitting: Cardiovascular Disease

## 2022-01-24 ENCOUNTER — Ambulatory Visit (INDEPENDENT_AMBULATORY_CARE_PROVIDER_SITE_OTHER): Payer: Medicare Other

## 2022-01-24 DIAGNOSIS — E785 Hyperlipidemia, unspecified: Secondary | ICD-10-CM | POA: Diagnosis not present

## 2022-01-24 DIAGNOSIS — I7121 Aneurysm of the ascending aorta, without rupture: Secondary | ICD-10-CM

## 2022-01-24 DIAGNOSIS — R7989 Other specified abnormal findings of blood chemistry: Secondary | ICD-10-CM | POA: Diagnosis not present

## 2022-01-24 LAB — ECHOCARDIOGRAM COMPLETE
AV Mean grad: 27 mmHg
AV Peak grad: 49.8 mmHg
Ao pk vel: 3.53 m/s
Area-P 1/2: 2.76 cm2
MV M vel: 5.73 m/s
MV Peak grad: 131.3 mmHg
P 1/2 time: 333 msec
Radius: 0.7 cm
S' Lateral: 3.89 cm

## 2022-01-24 NOTE — Telephone Encounter (Signed)
Rx request sent to pharmacy.  

## 2022-01-25 DIAGNOSIS — R82998 Other abnormal findings in urine: Secondary | ICD-10-CM | POA: Diagnosis not present

## 2022-01-28 DIAGNOSIS — N1831 Chronic kidney disease, stage 3a: Secondary | ICD-10-CM | POA: Diagnosis not present

## 2022-01-28 DIAGNOSIS — I1310 Hypertensive heart and chronic kidney disease without heart failure, with stage 1 through stage 4 chronic kidney disease, or unspecified chronic kidney disease: Secondary | ICD-10-CM | POA: Diagnosis not present

## 2022-01-28 DIAGNOSIS — K8689 Other specified diseases of pancreas: Secondary | ICD-10-CM | POA: Diagnosis not present

## 2022-01-28 DIAGNOSIS — I480 Paroxysmal atrial fibrillation: Secondary | ICD-10-CM | POA: Diagnosis not present

## 2022-01-28 DIAGNOSIS — I739 Peripheral vascular disease, unspecified: Secondary | ICD-10-CM | POA: Diagnosis not present

## 2022-01-28 DIAGNOSIS — F17290 Nicotine dependence, other tobacco product, uncomplicated: Secondary | ICD-10-CM | POA: Diagnosis not present

## 2022-01-28 DIAGNOSIS — M1990 Unspecified osteoarthritis, unspecified site: Secondary | ICD-10-CM | POA: Diagnosis not present

## 2022-01-28 DIAGNOSIS — E559 Vitamin D deficiency, unspecified: Secondary | ICD-10-CM | POA: Diagnosis not present

## 2022-01-28 DIAGNOSIS — Z1331 Encounter for screening for depression: Secondary | ICD-10-CM | POA: Diagnosis not present

## 2022-01-28 DIAGNOSIS — Z1389 Encounter for screening for other disorder: Secondary | ICD-10-CM | POA: Diagnosis not present

## 2022-01-28 DIAGNOSIS — M810 Age-related osteoporosis without current pathological fracture: Secondary | ICD-10-CM | POA: Diagnosis not present

## 2022-01-28 DIAGNOSIS — E785 Hyperlipidemia, unspecified: Secondary | ICD-10-CM | POA: Diagnosis not present

## 2022-01-28 DIAGNOSIS — I350 Nonrheumatic aortic (valve) stenosis: Secondary | ICD-10-CM | POA: Diagnosis not present

## 2022-01-28 DIAGNOSIS — Z0000 Encounter for general adult medical examination without abnormal findings: Secondary | ICD-10-CM | POA: Diagnosis not present

## 2022-02-21 ENCOUNTER — Other Ambulatory Visit: Payer: Self-pay | Admitting: Acute Care

## 2022-02-21 DIAGNOSIS — F17210 Nicotine dependence, cigarettes, uncomplicated: Secondary | ICD-10-CM

## 2022-02-21 DIAGNOSIS — Z122 Encounter for screening for malignant neoplasm of respiratory organs: Secondary | ICD-10-CM

## 2022-02-21 DIAGNOSIS — Z87891 Personal history of nicotine dependence: Secondary | ICD-10-CM

## 2022-02-23 DIAGNOSIS — Z23 Encounter for immunization: Secondary | ICD-10-CM | POA: Diagnosis not present

## 2022-03-03 NOTE — Progress Notes (Unsigned)
Office Visit    Patient Name: [name] Date of Encounter: 03/04/2022  PCP:  Derek Baton, MD   [name]  Cardiologist:  Derek Latch, MD  Advanced Practice Provider:  No care team member to display Electrophysiologist:  None      Chief Complaint    [name] is a 82 y.o. male presents today for follow up after echocardiogram.   Past Medical History    Past Medical History:  Diagnosis Date   Anxiety    reports scotch helps that    Aortic stenosis, mild 08/18/2018   Mean gradient 14 mmHg 04/2018   Arthritis    " it's everywhere"   Blood transfusion without reported diagnosis    Cataract    having cataract surgery July 2016   Chronic bronchitis ([place])    Chronic kidney disease    hx kidney stones   Colon polyps    Complication of anesthesia    after surgery 07-2013 unable to sleep for 3 days   COPD (chronic obstructive pulmonary disease) ([place])    Coronary artery calcification seen on CAT scan 08/18/2018   GERD (gastroesophageal reflux disease)    History of kidney stones    Hyperlipidemia    Hypertension    Prostate hypertrophy    Pulmonary embolism ([place]) 12/2014   post TJR   Seasonal allergies    Skin cancer    squamous cell, 1 melanoma left shoulder, one episode- under tongue, has had a total of 17-18 episodes    Sleep apnea    had surgery in 2000 for sleep apnea, no CPAP needed   Thoracic aortic aneurysm ([place]) 05/04/2019   4.6 cm 04/2019   Past Surgical History:  Procedure Laterality Date   ANTERIOR CERVICAL DECOMP/DISCECTOMY FUSION N/A 11/30/2020   Procedure: Cervical Four-Five, Cervical Five-Six, Cervical Six-Seven Anterior cervical decompression/discectomy/fusion;  Surgeon: Derek Miss, MD;  Location: [place];  Service: Neurosurgery;  Laterality: N/A;   CAROTID ENDARTERECTOMY Right 10/01/2013   CE   COLONOSCOPY     ENDARTERECTOMY Right 10/01/2013   Procedure: ENDARTERECTOMY CAROTID-RIGHT;  Surgeon: Derek Posner,  MD;  Location: [facility];  Service: Vascular;  Laterality: Right;   ESOPHAGOGASTRODUODENOSCOPY ENDOSCOPY  10/12/2013   EXCISION ORAL LESION WITH CO2 LASER N/A 09/08/2015   Procedure: EXCISION ORAL LESION WITH CO2 LASER;  Surgeon: Derek Belfast, MD;  [facility];  Service: ENT;  Laterality: N/A;   EYE SURGERY Bilateral    cataracts, W/IOL   JOINT REPLACEMENT Right 09/02/2014   Knee   JOINT REPLACEMENT Left 2012   Knee   PATCH ANGIOPLASTY Right 10/01/2013   Procedure: PATCH ANGIOPLASTY USING HEMASHIELD FINESSE PATCH;  Surgeon: Derek Posner, MD;  Location: [place];  Service: Vascular;  Laterality: Right;   removal of melonomia Left    shoulder   REPLACEMENT TOTAL KNEE Left    REVERSE SHOULDER ARTHROPLASTY Right 10/11/2016   Procedure: REVERSE RIGHT SHOULDER ARTHROPLASTY;  Surgeon: Derek Cedars, MD;  Location: [place];  Service: Orthopedics;  Laterality: Right;   REVERSE SHOULDER ARTHROPLASTY Left 12/03/2019   Procedure: REVERSE SHOULDER ARTHROPLASTY;  Surgeon: Derek Cedars, MD;  Location: [facility];  Service: Orthopedics;  Laterality: Left;  interscalene block   RHINOPLASTY     1999   RHINOPLASTY     SKIN CANCER EXCISION     squamous cell cancer under tongue   SKIN SURGERY     squamous cell carcinoma removed in various places,melanoma removed from L shoulder  sleep apnea surgery  2000   SPINAL FUSION N/A 11/30/2020   STERIOD INJECTION Left 10/11/2016   Procedure: STEROID INJECTION LEFT SHOULDER;  Surgeon: Derek Cedars, MD;  Location: [place];  Service: Orthopedics;  Laterality: Left;   TOTAL HIP ARTHROPLASTY Left 10/01/2017   Procedure: LEFT TOTAL HIP ARTHROPLASTY ANTERIOR APPROACH;  Surgeon: Derek Arabian, MD;  Location: [facility];  Service: Orthopedics;  Laterality: Left;   TOTAL KNEE ARTHROPLASTY Right 09/02/2014   Procedure: RIGHT TOTAL KNEE ARTHROPLASTY;  Surgeon: Derek Cedars, MD;  Location: [place];  Service: Orthopedics;  Laterality: Right;    Allergies  Allergies  Allergen  Reactions   Cefaclor Other (See Comments)     skin split   Penicillins Rash    Has patient had a PCN reaction causing immediate rash, facial/tongue/throat swelling, SOB or lightheadedness with hypotension:unsure Has patient had a PCN reaction causing severe rash involving mucus membranes or skin necrosis:unsure Has patient had a PCN reaction that required hospitalization:No Has patient had a PCN reaction occurring within the last 10 years:No--childhood allergy If all of the above answers are "NO", then may proceed with Cephalosporin use.     History of Present Illness    [name] is a 81 y.o. male with a hx of HTN, asympcomatic coronary calcification, emphysema, GI bleed, LE DVT, mild AS, ascending aortic aneurysm, carotid stenosis s/p right CEA,, prior saddle PE (2016), CKD, GERD last seen 08/28/21.  Prior saddle PE 12/2014 in setting of LE DVT. CT 02/2018 atherosclerosis of aorta and all three coronary vessels including LM. Myoview 04/2018 no ischemia. Echo EF 60-65%, gr1DD, mild AS (mean gradient 82mHg). Repeat echo 04/2019 mean gradient 158mg and 04/2020 1946m.   Seen 06/2021 by [name] on Metoprolol 53m43me to ascending aortic aneurysm. HCTZ discontinued for renal protection given daily Lasix usage. CT 10/26/21 ascending aortic aneurysm up to 4.5 cm recommended for semi annual imaging. 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 Event monitor 09/2021 with 1% atrial fibrillation burden. One 6 beat run of VT which was asymptomatic. Toprol increased to 50mg72m Echo 01/24/22 with normal LVEF, gr2DD, mild to moderate MR, ascending of ascending aorta 46mm,52mere aortic stenosis (mean gradient 37mmHg22max 3.8 m/s, AVA 0.8 cm^2).   He presents today for follow up independently. 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 He also enjoys spending time in his herb garden. He notes back pain related to arthritis for which he follows with orthopedics. Denies chest pain. Reports no shortness of breath nor dyspnea on exertion. He drinks two glasses of scotch daily.He notes swelling in his bilateral ankles worse at end of day. No orthopnea, PND. He does usually wear compression stockings which helps some.   EKGs/Labs/Other Studies Reviewed:   The following studies were reviewed today:  06/2021 TTE: EF 60-65%, moderate LVH, no RWMA, grade 1 DD, mod MAC, aortic ascending aneurysm 4.5 cm. 06/2020 carotid duplex: Right and left ICA with1-39% stenosis 04/2018 Myoview: No ST deviations during stress. Low risk, normal study.   EKG:  No EKG today.  Recent Labs: 11/09/2021: BUN 17; Creatinine, Ser 1.31; Potassium 4.3; Sodium 143  Recent Lipid Panel No results found for: "CHOL", "TRIG", "HDL", "CHOLHDL", "VLDL", "LDLCALC", "LDLDIRECT"  Home Medications   Current Meds  Medication Sig  acidophilus (RISAQUAD) CAPS capsule Take 1 capsule by mouth daily.   Camphor-Menthol-Methyl Sal (SALONPAS) 3.05-25-08 % PTCH Apply 1 application topically daily as needed (kneck pain).   cholecalciferol (VITAMIN D) 1000 units tablet Take 1,000 Units by mouth daily.   ELIQUIS 2.5 MG TABS tablet TAKE ONE TABLET TWICE DAILY   esomeprazole (NEXIUM) 40 MG capsule Take 40 mg by mouth daily as needed (acid reflux).   fenofibrate (TRICOR) 145 MG tablet Take 145 mg by mouth daily before breakfast.    finasteride (PROSCAR) 5 MG tablet Take 5 mg by mouth daily.   fluticasone (FLONASE SENSIMIST) 27.5 MCG/SPRAY nasal spray Place 1 spray into the nose as needed.   fluticasone-salmeterol (ADVAIR) 100-50 MCG/ACT AEPB Inhale 1 puff into the lungs 2 (two) times daily.   furosemide (LASIX) 40 MG tablet Take 1 tablet (40 mg total) by mouth daily.   lipase/protease/amylase (CREON) 36000 UNITS CPEP capsule Take 2  capsules (72,000 Units total) by mouth 3 (three) times daily with meals. May also take 1 capsule (36,000 Units total) as needed (with snacks).   loratadine (CLARITIN) 10 MG tablet Take 10 mg by mouth daily as needed for allergies.   metoprolol succinate (TOPROL-XL) 50 MG 24 hr tablet Take 1 tablet (50 mg total) by mouth daily. Take 2 daily   NON FORMULARY Place 1,000 mg under the tongue daily. CBD Oil   Omega-3 Fatty Acids (FISH OIL) 1000 MG CAPS Take 1,000 mg by mouth daily.   probenecid (BENEMID) 500 MG tablet Take 500 mg by mouth daily before breakfast.    rosuvastatin (CRESTOR) 10 MG tablet Take 10 mg by mouth daily.   sildenafil (VIAGRA) 25 MG tablet Take 25 mg by mouth daily as needed for erectile dysfunction.   tamsulosin (FLOMAX) 0.4 MG CAPS capsule Take 0.4 mg by mouth daily.    [DISCONTINUED] furosemide (LASIX) 20 MG tablet TAKE ONE TABLET EACH DAY AS NEEDED FOR EDEMA     Review of Systems     All other systems reviewed and are otherwise negative except as noted above.  Physical Exam    VS:  BP 126/78   Pulse 81   Ht _0  (1.778 m)   Wt 186 lb 3.2 oz (84.5 kg)   BMI 26.72 kg/m  , BMI Body mass index is 26.72 kg/m.  Wt Readings from Last 3 Encounters:  03/04/22 186 lb 3.2 oz (84.5 kg)  11/22/21 188 lb 12.8 oz (85.6 kg)  08/28/21 191 lb 9.6 oz (86.9 kg)     GEN: Well nourished, well developed, in no acute distress. HEENT: normal. Neck: Supple, no JVD, carotid bruits, or masses. Cardiac: RRR, no rubs, or gallops. Gr 3/6 murmur at RUSB. No clubbing, cyanosis. 2+ pitting pedal edema bilaterally.  Radials/PT 2+ and equal bilaterally.  Respiratory:  Respirations regular and unlabored, clear to auscultation bilaterally. GI: Soft, nontender, nondistended. MS: No deformity or atrophy. Skin: Warm and dry, no rash. Neuro:  Strength and sensation are intact. Psych: Normal affect.  Assessment & Plan    VT / PAF / Hypercoagulable state - Denies palpitations. Continue Eliquis  2.3m BID, Toprol 540mQD.   Ascending thoracic aortic aneurysm - 4622my echo 01/24/22. Continue optimal BP control. Repeat imaging in one year can be coordinated at follow up.   Aortic stenosis - 01/24/22 echo with severe AS (Vmax 3.8 m/s, MG 37 mmHg). Notes LE edema but no dyspnea, chest pain, syncope. Continue optimal BP and volume control. Refer to Structural Heart Team due  to severe AS.   LE edema - Bilateral 2+ pitting pedal edema.  Increase Lasix to 40 mg daily.  BMP in 1 week. Low salt diet, compression stockings, elevating legs encouraged. MyChart message in 1 week to check in - if no improvement, consider transition to Torsemide.  HLD - Continue Rosuvastatin 92m QD. Denies myalgias.   HTN - BP well controlled. Continue current antihypertensive regimen.    Hx of PE - Continue OAC. Denies bleeding complications.        Disposition: Follow up in 4 month(s) with TSkeet Latch MD or APP.  Signed, CLoel Dubonnet NP 03/04/2022, 8:51 AM [facility]

## 2022-03-04 ENCOUNTER — Encounter (HOSPITAL_BASED_OUTPATIENT_CLINIC_OR_DEPARTMENT_OTHER): Payer: Self-pay | Admitting: Family

## 2022-03-04 ENCOUNTER — Ambulatory Visit (INDEPENDENT_AMBULATORY_CARE_PROVIDER_SITE_OTHER): Payer: Medicare Other | Admitting: Family

## 2022-03-04 VITALS — BP 126/78 | HR 81 | Ht 70.0 in | Wt 186.2 lb

## 2022-03-04 DIAGNOSIS — I472 Ventricular tachycardia, unspecified: Secondary | ICD-10-CM | POA: Diagnosis not present

## 2022-03-04 DIAGNOSIS — D6859 Other primary thrombophilia: Secondary | ICD-10-CM | POA: Diagnosis not present

## 2022-03-04 DIAGNOSIS — E782 Mixed hyperlipidemia: Secondary | ICD-10-CM

## 2022-03-04 DIAGNOSIS — I10 Essential (primary) hypertension: Secondary | ICD-10-CM

## 2022-03-04 DIAGNOSIS — I350 Nonrheumatic aortic (valve) stenosis: Secondary | ICD-10-CM | POA: Diagnosis not present

## 2022-03-04 DIAGNOSIS — I7121 Aneurysm of the ascending aorta, without rupture: Secondary | ICD-10-CM

## 2022-03-04 DIAGNOSIS — Z86711 Personal history of pulmonary embolism: Secondary | ICD-10-CM | POA: Diagnosis not present

## 2022-03-04 MED ORDER — FUROSEMIDE 40 MG PO TABS: 40.0000 mg | ORAL_TABLET | Freq: Every day | ORAL | 1 refills | Status: DC

## 2022-03-04 MED ORDER — LOSARTAN POTASSIUM 50 MG PO TABS: 50.0000 mg | ORAL_TABLET | Freq: Every day | ORAL | 3 refills | Status: AC

## 2022-03-04 NOTE — Patient Instructions (Signed)
Medication Instructions:  Your physician has recommended you make the following change in your medication:   Change: Lasix '40mg'$  daily   *If you need a refill on your cardiac medications before your next appointment, please call your pharmacy*   Lab Work: Please return for Lab work in one week for BMP. You may come to the...   [facility] (3rd floor) [address], [place], [place] [address]  Open: 8am-Noon and 1pm-4:30pm  Please ring the doorbell on the small table when you exit the elevator and the Lab Tech will come get you  [place] [facility] [address] [place], [place], [place] [address] Open: 8am-1pm, then 2pm-4:30pm   [place]- Please see attached locations sheet stapled to your lab work with address and hours.   If you have labs (blood work) drawn today and your tests are completely normal, you will receive your results only by: [place] (if you have MyChart) OR A paper copy in the mail If you have any lab test that is abnormal or we need to change your treatment, we will call you to review the results.  Follow-Up: At [facility], you and your health needs are our priority.  As part of our continuing mission to provide you with exceptional heart care, we have created designated Provider Care Teams.  These Care Teams include your primary Cardiologist (physician) and Advanced Practice Providers (APPs -  Physician Assistants and Nurse Practitioners) who all work together to provide you with the care you need, when you need it.  We recommend signing up for the patient portal called "MyChart".  Sign up information is provided on this After Visit Summary.  MyChart is used to connect with patients for Virtual Visits (Telemedicine).  Patients are able to view lab/test results, encounter notes, upcoming appointments, etc.  Non-urgent messages can be sent to your provider as well.   To learn more about what you can do  with MyChart, go to [email or web address].    Your next appointment:   4 month(s)  The format for your next appointment:   In Person  Provider:   Skeet Latch, MD   Other Instructions Heart Healthy Diet Recommendations: A low-salt diet is recommended. Meats should be grilled, baked, or boiled. Avoid fried foods. Focus on lean protein sources like fish or chicken with vegetables and fruits. The American Heart Association is a Microbiologist!  American Heart Association Diet and Lifeystyle Recommendations   Exercise recommendations: The American Heart Association recommends 150 minutes of moderate intensity exercise weekly. Try 30 minutes of moderate intensity exercise 4-5 times per week. This could include walking, jogging, or swimming.   Important Information About Sugar

## 2022-03-11 DIAGNOSIS — I350 Nonrheumatic aortic (valve) stenosis: Secondary | ICD-10-CM | POA: Diagnosis not present

## 2022-03-11 DIAGNOSIS — I10 Essential (primary) hypertension: Secondary | ICD-10-CM | POA: Diagnosis not present

## 2022-03-11 DIAGNOSIS — E782 Mixed hyperlipidemia: Secondary | ICD-10-CM | POA: Diagnosis not present

## 2022-03-12 ENCOUNTER — Encounter (HOSPITAL_BASED_OUTPATIENT_CLINIC_OR_DEPARTMENT_OTHER): Payer: Self-pay

## 2022-03-12 LAB — BASIC METABOLIC PANEL
BUN/Creatinine Ratio: 14 (ref 10–24)
BUN: 25 mg/dL (ref 8–27)
CO2: 24 mmol/L (ref 20–29)
Calcium: 9.6 mg/dL (ref 8.6–10.2)
Chloride: 99 mmol/L (ref 96–106)
Creatinine, Ser: 1.77 mg/dL — ABNORMAL HIGH (ref 0.76–1.27)
Glucose: 143 mg/dL — ABNORMAL HIGH (ref 70–99)
Potassium: 3.7 mmol/L (ref 3.5–5.2)
Sodium: 142 mmol/L (ref 134–144)
eGFR: 39 mL/min/{1.73_m2} — ABNORMAL LOW (ref >59)

## 2022-03-13 MED ORDER — FUROSEMIDE 40 MG PO TABS: ORAL_TABLET | ORAL | 3 refills | Status: AC

## 2022-03-13 NOTE — Telephone Encounter (Signed)
Pt. Update

## 2022-03-13 NOTE — Telephone Encounter (Signed)
Seen by patient [name] on 03/13/2022  9:15 AM; results explained and orders placed.

## 2022-03-14 ENCOUNTER — Telehealth (HOSPITAL_BASED_OUTPATIENT_CLINIC_OR_DEPARTMENT_OTHER): Payer: Self-pay | Admitting: Cardiovascular Disease

## 2022-03-14 DIAGNOSIS — Z961 Presence of intraocular lens: Secondary | ICD-10-CM | POA: Diagnosis not present

## 2022-03-14 DIAGNOSIS — H349 Unspecified retinal vascular occlusion: Secondary | ICD-10-CM | POA: Diagnosis not present

## 2022-03-14 MED ORDER — FUROSEMIDE 20 MG PO TABS: ORAL_TABLET | ORAL | 3 refills | Status: AC

## 2022-03-14 NOTE — Telephone Encounter (Signed)
Pt c/o medication issue:  1. Name of Medication: Furosemide (lasix)  2. How are you currently taking this medication (dosage and times per day)?  Please alternate '20mg'$  and '40mg'$  every other day.  3. Are you having a reaction (difficulty breathing--STAT)? no  4. What is your medication issue? Calling to say they need a prescription for the '20mg'$ 

## 2022-03-14 NOTE — Telephone Encounter (Signed)
Patient takes Lasix 40 mg daily alternating with 20 mg daily Unsure if patient prefers taking full tablet or ok with taking 1/2 tablet (could avoid 2 copays)  Left message for patient to call back

## 2022-03-14 NOTE — Telephone Encounter (Signed)
Spoke with patient and he prefers having 2 different pills so he does not have to cut in half Rx sent to [facility]

## 2022-03-18 ENCOUNTER — Ambulatory Visit: Payer: Medicare Other | Attending: Internal Medicine | Admitting: Internal Medicine

## 2022-03-18 ENCOUNTER — Telehealth: Payer: Self-pay | Admitting: Internal Medicine

## 2022-03-18 ENCOUNTER — Encounter: Payer: Self-pay | Admitting: Internal Medicine

## 2022-03-18 ENCOUNTER — Other Ambulatory Visit: Payer: Self-pay | Admitting: Internal Medicine

## 2022-03-18 VITALS — BP 90/64 | HR 86 | Ht 70.0 in | Wt 181.0 lb

## 2022-03-18 DIAGNOSIS — Z01812 Encounter for preprocedural laboratory examination: Secondary | ICD-10-CM | POA: Diagnosis not present

## 2022-03-18 DIAGNOSIS — I350 Nonrheumatic aortic (valve) stenosis: Secondary | ICD-10-CM | POA: Diagnosis not present

## 2022-03-18 DIAGNOSIS — I700 Atherosclerosis of aorta: Secondary | ICD-10-CM | POA: Insufficient documentation

## 2022-03-18 DIAGNOSIS — I480 Paroxysmal atrial fibrillation: Secondary | ICD-10-CM | POA: Diagnosis not present

## 2022-03-18 DIAGNOSIS — N1832 Chronic kidney disease, stage 3b: Secondary | ICD-10-CM | POA: Insufficient documentation

## 2022-03-18 DIAGNOSIS — I7121 Aneurysm of the ascending aorta, without rupture: Secondary | ICD-10-CM | POA: Insufficient documentation

## 2022-03-18 DIAGNOSIS — I2510 Atherosclerotic heart disease of native coronary artery without angina pectoris: Secondary | ICD-10-CM | POA: Insufficient documentation

## 2022-03-18 LAB — CBC
Hematocrit: 42.2 % (ref 37.5–51.0)
Hemoglobin: 13.8 g/dL (ref 13.0–17.7)
MCH: 30.9 pg (ref 26.6–33.0)
MCHC: 32.7 g/dL (ref 31.5–35.7)
MCV: 94 fL (ref 79–97)
Platelets: 163 10*3/uL (ref 150–450)
RBC: 4.47 x10E6/uL (ref 4.14–5.80)
RDW: 14.1 % (ref 11.6–15.4)
WBC: 8.4 10*3/uL (ref 3.4–10.8)

## 2022-03-18 LAB — BASIC METABOLIC PANEL
BUN/Creatinine Ratio: 15 (ref 10–24)
BUN: 27 mg/dL (ref 8–27)
CO2: 31 mmol/L — ABNORMAL HIGH (ref 20–29)
Calcium: 9.7 mg/dL (ref 8.6–10.2)
Chloride: 100 mmol/L (ref 96–106)
Creatinine, Ser: 1.78 mg/dL — ABNORMAL HIGH (ref 0.76–1.27)
Glucose: 165 mg/dL — ABNORMAL HIGH (ref 70–99)
Potassium: 4.3 mmol/L (ref 3.5–5.2)
Sodium: 142 mmol/L (ref 134–144)
eGFR: 38 mL/min/{1.73_m2} — ABNORMAL LOW (ref >59)

## 2022-03-18 MED ORDER — SODIUM CHLORIDE 0.9% FLUSH: 3.0000 mL | Freq: Two times a day (BID) | INTRAVENOUS | Status: AC

## 2022-03-18 NOTE — H&P (View-Only) (Signed)
Patient ID: [name] MRN: [id number] DOB/AGE: Aug 18, 1940 81 y.o.  Primary Care Physician:Russo, Jenny Reichmann, MD Primary Cardiologist: Skeet Latch, MD   FOCUSED CARDIOVASCULAR PROBLEM LIST:   1.  Severe aortic stenosis with aortic valve area of 0.8 cm with a dimensionless index of 0.24, mean gradient of 37 mmHg, and peak velocity of 3.8 m/s with an ejection fraction of 60 to 65%; EKG demonstrates no conduction abnormalities 2.  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  Hypertension 4.  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  COPD 6.  Hyperlipidemia 7.  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 male with the indicated medical history here for recommendations regarding his aortic stenosis.  He recently had an echocardiogram which demonstrated severe aortic stenosis.  The patient continues to work full-time.  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  He does have issues with arthritis.  He has had multiple orthopedic surgeries of his knees, cervical spine, and lumbar spine.  The patient tells me that he is relatively asymptomatic.  He is able to do all of his activities of daily living without any exertional dyspnea, presyncope, syncope, or chest discomfort.  He is required no emergency room visits or hospitalizations.  He has not really noticed slowing down in any way over the last few months.  He fortunately has not required any emergency room visits or hospitalizations.  He denies any severe bleeding or bruising episodes.  He does smoke cigars on occasion.  He sees a Pharmacist, community on a regular basis and reports a very good dental health.  Past Medical History:  Diagnosis Date   Anxiety    reports scotch helps that    Aortic stenosis, mild 08/18/2018   Mean gradient 14 mmHg 04/2018   Arthritis    " it's everywhere"   Blood transfusion  without reported diagnosis    Cataract    having cataract surgery July 2016   Chronic bronchitis ([place])    Chronic kidney disease    hx kidney stones   Colon polyps    Complication of anesthesia    after surgery 07-2013 unable to sleep for 3 days   COPD (chronic obstructive pulmonary disease) ([place])    Coronary artery calcification seen on CAT scan 08/18/2018   GERD (gastroesophageal reflux disease)    History of kidney stones    Hyperlipidemia    Hypertension    Prostate hypertrophy    Pulmonary embolism ([place]) 12/2014   post TJR   Seasonal allergies    Skin cancer    squamous cell, 1 melanoma left shoulder, one episode- under tongue, has had a total of 17-18 episodes    Sleep apnea    had surgery in 2000 for sleep apnea, no CPAP needed   Thoracic aortic aneurysm ([place]) 05/04/2019   4.6 cm 04/2019    Past Surgical History:  Procedure Laterality Date   ANTERIOR CERVICAL DECOMP/DISCECTOMY FUSION N/A 11/30/2020   Procedure: Cervical Four-Five, Cervical Five-Six, Cervical Six-Seven Anterior cervical decompression/discectomy/fusion;  Surgeon: Kristeen Miss, MD;  Location: [facility];  Service: Neurosurgery;  Laterality: N/A;   CAROTID ENDARTERECTOMY Right 10/01/2013   CE   COLONOSCOPY     ENDARTERECTOMY Right 10/01/2013   Procedure: ENDARTERECTOMY CAROTID-RIGHT;  Surgeon: Rosetta Posner, MD;  Location: [facility];  Service: Vascular;  Laterality: Right;   ESOPHAGOGASTRODUODENOSCOPY ENDOSCOPY  10/12/2013  EXCISION ORAL LESION WITH CO2 LASER N/A 09/08/2015   Procedure: EXCISION ORAL LESION WITH CO2 LASER;  Surgeon: Jerrell Belfast, MD;  [facility];  Service: ENT;  Laterality: N/A;   EYE SURGERY Bilateral    cataracts, W/IOL   JOINT REPLACEMENT Right 09/02/2014   Knee   JOINT REPLACEMENT Left 2012   Knee   PATCH ANGIOPLASTY Right 10/01/2013   Procedure: PATCH ANGIOPLASTY USING HEMASHIELD FINESSE PATCH;  Surgeon: Rosetta Posner, MD;  Location: [facility];  Service: Vascular;  Laterality: Right;    removal of melonomia Left    shoulder   REPLACEMENT TOTAL KNEE Left    REVERSE SHOULDER ARTHROPLASTY Right 10/11/2016   Procedure: REVERSE RIGHT SHOULDER ARTHROPLASTY;  Surgeon: Netta Cedars, MD;  Location: [place];  Service: Orthopedics;  Laterality: Right;   REVERSE SHOULDER ARTHROPLASTY Left 12/03/2019   Procedure: REVERSE SHOULDER ARTHROPLASTY;  Surgeon: Netta Cedars, MD;  Location: [facility];  Service: Orthopedics;  Laterality: Left;  interscalene block   RHINOPLASTY     1999   RHINOPLASTY     SKIN CANCER EXCISION     squamous cell cancer under tongue   SKIN SURGERY     squamous cell carcinoma removed in various places,melanoma removed from L shoulder   sleep apnea surgery  2000   SPINAL FUSION N/A 11/30/2020   STERIOD INJECTION Left 10/11/2016   Procedure: STEROID INJECTION LEFT SHOULDER;  Surgeon: Netta Cedars, MD;  Location: [place];  Service: Orthopedics;  Laterality: Left;   TOTAL HIP ARTHROPLASTY Left 10/01/2017   Procedure: LEFT TOTAL HIP ARTHROPLASTY ANTERIOR APPROACH;  Surgeon: Gaynelle Arabian, MD;  Location: [facility];  Service: Orthopedics;  Laterality: Left;   TOTAL KNEE ARTHROPLASTY Right 09/02/2014   Procedure: RIGHT TOTAL KNEE ARTHROPLASTY;  Surgeon: Netta Cedars, MD;  Location: [facility];  Service: Orthopedics;  Laterality: Right;    Family History  Problem Relation Age of Onset   Diverticulosis Mother    Skin cancer Mother    Varicose Veins Mother    Heart attack Mother    Hyperlipidemia Father    Heart disease Father    Heart attack Father    Colon cancer Neg Hx    Esophageal cancer Neg Hx    Rectal cancer Neg Hx    Stomach cancer Neg Hx     Social History   Socioeconomic History   Marital status: Divorced    Spouse name: Not on file   Number of children: 2   Years of education: Not on file   Highest education level: Not on file  Occupational History   Occupation: real Government social research officer: [employer]  Tobacco Use   Smoking status: Light  Smoker    Types: Cigars   Smokeless tobacco: Never   Tobacco comments:    pt states he smokes a couple of cigars per day. Never smoke cigarettes.  1 in the afternoon with an adult beverage and one after a meal.  Vaping Use   Vaping Use: Never used  Substance and Sexual Activity   Alcohol use: Yes    Alcohol/week: 21.0 standard drinks of alcohol    Types: 7 Glasses of wine, 14 Shots of liquor per week    Comment: weekly   Drug use: No   Sexual activity: Not on file  Other Topics Concern   Not on file  Social History Narrative   Not on file   Social Determinants of Health   Financial Resource Strain: Not on file  Food Insecurity: Not on file  Transportation Needs: Not on file  Physical Activity: Not on file  Stress: Not on file  Social Connections: Not on file  Intimate Partner Violence: Not on file     Prior to Admission medications   Medication Sig Start Date End Date Taking? Authorizing Provider  acidophilus (RISAQUAD) CAPS capsule Take 1 capsule by mouth daily.    [provider]  Camphor-Menthol-Methyl Sal (SALONPAS) 3.05-25-08 % PTCH Apply 1 application topically daily as needed (kneck pain).    [provider]  cholecalciferol (VITAMIN D) 1000 units tablet Take 1,000 Units by mouth daily.    [provider]  ELIQUIS 2.5 MG TABS tablet TAKE ONE TABLET TWICE DAILY 08/27/21   Brand Males, MD  esomeprazole (NEXIUM) 40 MG capsule Take 40 mg by mouth daily as needed (acid reflux).    [provider]  fenofibrate (TRICOR) 145 MG tablet Take 145 mg by mouth daily before breakfast.     [provider]  finasteride (PROSCAR) 5 MG tablet Take 5 mg by mouth daily. 10/07/19   [provider]  fluticasone (FLONASE SENSIMIST) 27.5 MCG/SPRAY nasal spray Place 1 spray into the nose as needed.    [provider]  fluticasone-salmeterol (ADVAIR) 100-50 MCG/ACT AEPB Inhale 1 puff into the lungs 2 (two) times daily. 11/22/21    Brand Males, MD  furosemide (LASIX) 20 MG tablet Take 1 every other day alternating with 40 mg 03/14/22   Skeet Latch, MD  furosemide (LASIX) 40 MG tablet Please alternate '20mg'$  and '40mg'$  every other day. 03/13/22   Loel Dubonnet, NP  lipase/protease/amylase (CREON) 36000 UNITS CPEP capsule Take 2 capsules (72,000 Units total) by mouth 3 (three) times daily with meals. May also take 1 capsule (36,000 Units total) as needed (with snacks). 02/02/21   Noralyn Pick, NP  loratadine (CLARITIN) 10 MG tablet Take 10 mg by mouth daily as needed for allergies.    [provider]  losartan (COZAAR) 50 MG tablet Take 1 tablet (50 mg total) by mouth daily. 03/04/22 02/27/23  Loel Dubonnet, NP  metoprolol succinate (TOPROL-XL) 50 MG 24 hr tablet Take 1 tablet (50 mg total) by mouth daily. Take 2 daily 08/28/21   Marylu Lund., NP  NON FORMULARY Place 1,000 mg under the tongue daily. CBD Oil    [provider]  Omega-3 Fatty Acids (FISH OIL) 1000 MG CAPS Take 1,000 mg by mouth daily.    [provider]  probenecid (BENEMID) 500 MG tablet Take 500 mg by mouth daily before breakfast.     [provider]  rosuvastatin (CRESTOR) 10 MG tablet Take 10 mg by mouth daily.    [provider]  sildenafil (VIAGRA) 25 MG tablet Take 25 mg by mouth daily as needed for erectile dysfunction.    [provider]  tamsulosin (FLOMAX) 0.4 MG CAPS capsule Take 0.4 mg by mouth daily.     [provider]    Allergies  Allergen Reactions   Cefaclor Other (See Comments)     skin split   Penicillins Rash    Has patient had a PCN reaction causing immediate rash, facial/tongue/throat swelling, SOB or lightheadedness with hypotension:unsure Has patient had a PCN reaction causing severe rash involving mucus membranes or skin necrosis:unsure Has patient had a PCN reaction that required hospitalization:No Has patient had a PCN reaction  occurring within the last 10 years:No--childhood allergy If all of the above answers are "NO", then  may proceed with Cephalosporin use.     REVIEW OF SYSTEMS:  General: no fevers/chills/night sweats Eyes: no blurry vision, diplopia, or amaurosis ENT: no sore throat or hearing loss Resp: no cough, wheezing, or hemoptysis CV: no edema or palpitations GI: no abdominal pain, nausea, vomiting, diarrhea, or constipation GU: no dysuria, frequency, or hematuria Skin: no rash Neuro: no headache, numbness, tingling, or weakness of extremities Musculoskeletal: no joint pain or swelling Heme: no bleeding, DVT, or easy bruising Endo: no polydipsia or polyuria  BP 90/64   Pulse 86   Ht '5\' 10"'$  (1.778 m)   Wt 181 lb (82.1 kg)   SpO2 96%   BMI 25.97 kg/m   PHYSICAL EXAM: GEN:  AO x 3 in no acute distress HEENT: normal Dentition: Normal Neck: JVP normal. +2 carotid upstrokes without bruits. No thyromegaly. Lungs: equal expansion, clear bilaterally CV: Apex is discrete and nondisplaced, RRR with 3/6 SEM Abd: soft, non-tender, non-distended; no bruit; positive bowel sounds Ext: no edema, ecchymoses, or cyanosis Vascular: 2+ femoral pulses, 2+ radial pulse on the right and +1 on left  Skin: warm and dry without rash Neuro: CN II-XII grossly intact; motor and sensory grossly intact    DATA AND STUDIES:  EKG: Sinus rhythm with PACs  2D ECHO: September 2023  1. Left ventricular ejection fraction, by estimation, is 60 to 65%. The  left ventricle has normal function. The left ventricle has no regional  wall motion abnormalities. There is moderate left ventricular hypertrophy.  Left ventricular diastolic  parameters are indeterminate.   2. Right ventricular systolic function is normal. The right ventricular  size is normal. Tricuspid regurgitation signal is inadequate for assessing  PA pressure.   3. Left atrial size was mildly dilated.   4. The mitral valve is degenerative. Mild to  moderate mitral valve  regurgitation. Severe mitral annular calcification.   5. Aortic dilatation noted. 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   6. The aortic valve is calcified. There is severe calcifcation of the  aortic valve. Aortic valve regurgitation is mild. Severe aortic valve  stenosis. Vmax 3.8 m/s, MG 37 mmHg, AVA 0.8 cm^2, DI 0.24   CARDIAC CATH: n/a  STS RISK CALCULATOR: pending  NHYA CLASS: 1    ASSESSMENT AND PLAN:   Aortic valve stenosis, etiology of cardiac valve disease unspecified - Plan: ECHOCARDIOGRAM COMPLETE  Aneurysm of ascending aorta without rupture (HCC)  Paroxysmal atrial fibrillation (HCC) - Plan: CBC  Stage 3b chronic kidney disease ([place]) - Plan: Basic metabolic panel  Aortic atherosclerosis (HCC)  Coronary artery calcification seen on CAT scan  Pre-procedure lab exam - Plan: CBC, Basic metabolic panel  The patient has developed severe but clinically asymptomatic aortic stenosis stage D2 with a preserved ejection fraction.  I had a long conversation with the patient.  I feel that he probably is not exerting himself to the degree to elicit symptoms given his diffuse osteoarthritis.  He fortunately has not developed any worrisome symptoms of presyncope or syncope.  His ejection fraction again is preserved.  We have a long conversation about potential options here including watchful waiting.  The patient is interested in an intervention when it is appropriate.  He is again relatively asymptomatic and so I think it makes sense to surveilled him for now.  Because he is interested in completing the evaluation I will refer him for cardiac catheterization, CT scan, and we will defer referral to cardiothoracic surgery for surgical opinion at this time.  I will see him back in 3 months with a echocardiogram.  I have asked him to closely monitor his symptoms and to let us know if he develops any presyncope, syncope, worsening shortness of breath, or  chest discomfort.  He agrees with this strategy.  I have personally reviewed the patients imaging data as summarized above.  I have reviewed the natural history of aortic stenosis with the patient and family members who are present today. We have discussed the limitations of medical therapy and the poor prognosis associated with symptomatic aortic stenosis. We have also reviewed potential treatment options, including palliative medical therapy, conventional surgical aortic valve replacement, and transcatheter aortic valve replacement. We discussed treatment options in the context of this patient's specific comorbid medical conditions.   All of the patient's questions were answered today. Will make further recommendations based on the results of studies outlined above.   Total time spent with patient today 60 minutes. This includes reviewing records, evaluating the patient and coordinating care.   Early Osmond, MD  03/18/2022 8:47 AM    [facility] [place], [place], [place]  [address] Phone: [phone]; Fax: [phone]

## 2022-03-18 NOTE — Telephone Encounter (Signed)
Returned call to patient, he states he would like to schedule R/L heart cath for 03/29/22 with Dr. Ali Lowe.  Scheduled with cath lab scheduler. Patient notified, instructions will be sent to patient via MyChart message per patient request.

## 2022-03-18 NOTE — Patient Instructions (Signed)
Medication Instructions:  Your physician recommends that you continue on your current medications as directed. Please refer to the Current Medication list given to you today.  *If you need a refill on your cardiac medications before your next appointment, please call your pharmacy*  Lab Work: TODAY: CBC, BMET If you have labs (blood work) drawn today and your tests are completely normal, you will receive your results only by: [place] (if you have MyChart) OR A paper copy in the mail If you have any lab test that is abnormal or we need to change your treatment, we will call you to review the results.   Testing/Procedures: Your physician has requested you have a right and left heart catheterization performed. Please call our office at [phone] and ask for Drue Dun to schedule this procedure. Available dates are November 1st, November 3rd and November 6th.  Your physician has requested that you have an echocardiogram 1-2 weeks prior to follow-up appointment, can be same day if more convenient. Echocardiography is a painless test that uses sound waves to create images of your heart. It provides your doctor with information about the size and shape of your heart and how well your heart's chambers and valves are working. This procedure takes approximately one hour. There are no restrictions for this procedure. Please do NOT wear cologne, perfume, aftershave, or lotions (deodorant is allowed). Please arrive 15 minutes prior to your appointment time.  Follow-Up: At [facility], you and your health needs are our priority.  As part of our continuing mission to provide you with exceptional heart care, we have created designated Provider Care Teams.  These Care Teams include your primary Cardiologist (physician) and Advanced Practice Providers (APPs -  Physician Assistants and Nurse Practitioners) who all work together to provide you with the care you need, when you need it.  Your  next appointment:   3 month(s)  The format for your next appointment:   In Person  Provider:   Early Osmond, MD  Important Information About Sugar

## 2022-03-18 NOTE — Progress Notes (Addendum)
Patient ID: [name] MRN: [id number] DOB/AGE: December 01, 1940 81 y.o.  Primary Care Physician:Russo, Jenny Reichmann, MD Primary Cardiologist: Skeet Latch, MD   FOCUSED CARDIOVASCULAR PROBLEM LIST:   1.  Severe aortic stenosis with aortic valve area of 0.8 cm with a dimensionless index of 0.24, mean gradient of 37 mmHg, and peak velocity of 3.8 m/s with an ejection fraction of 60 to 65%; EKG demonstrates no conduction abnormalities 2.  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  Hypertension 4.  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  COPD 6.  Hyperlipidemia 7.  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 male with the indicated medical history here for recommendations regarding his aortic stenosis.  He recently had an echocardiogram which demonstrated severe aortic stenosis.  The patient continues to work full-time.  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  He does have issues with arthritis.  He has had multiple orthopedic surgeries of his knees, cervical spine, and lumbar spine.  The patient tells me that he is relatively asymptomatic.  He is able to do all of his activities of daily living without any exertional dyspnea, presyncope, syncope, or chest discomfort.  He is required no emergency room visits or hospitalizations.  He has not really noticed slowing down in any way over the last few months.  He fortunately has not required any emergency room visits or hospitalizations.  He denies any severe bleeding or bruising episodes.  He does smoke cigars on occasion.  He sees a Pharmacist, community on a regular basis and reports a very good dental health.  Past Medical History:  Diagnosis Date   Anxiety    reports scotch helps that    Aortic stenosis, mild 08/18/2018   Mean gradient 14 mmHg 04/2018   Arthritis    " it's everywhere"   Blood transfusion  without reported diagnosis    Cataract    having cataract surgery July 2016   Chronic bronchitis ([place])    Chronic kidney disease    hx kidney stones   Colon polyps    Complication of anesthesia    after surgery 07-2013 unable to sleep for 3 days   COPD (chronic obstructive pulmonary disease) ([place])    Coronary artery calcification seen on CAT scan 08/18/2018   GERD (gastroesophageal reflux disease)    History of kidney stones    Hyperlipidemia    Hypertension    Prostate hypertrophy    Pulmonary embolism ([place]) 12/2014   post TJR   Seasonal allergies    Skin cancer    squamous cell, 1 melanoma left shoulder, one episode- under tongue, has had a total of 17-18 episodes    Sleep apnea    had surgery in 2000 for sleep apnea, no CPAP needed   Thoracic aortic aneurysm ([place]) 05/04/2019   4.6 cm 04/2019    Past Surgical History:  Procedure Laterality Date   ANTERIOR CERVICAL DECOMP/DISCECTOMY FUSION N/A 11/30/2020   Procedure: Cervical Four-Five, Cervical Five-Six, Cervical Six-Seven Anterior cervical decompression/discectomy/fusion;  Surgeon: Kristeen Miss, MD;  Location: [facility];  Service: Neurosurgery;  Laterality: N/A;   CAROTID ENDARTERECTOMY Right 10/01/2013   CE   COLONOSCOPY     ENDARTERECTOMY Right 10/01/2013   Procedure: ENDARTERECTOMY CAROTID-RIGHT;  Surgeon: Rosetta Posner, MD;  Location: [facility];  Service: Vascular;  Laterality: Right;   ESOPHAGOGASTRODUODENOSCOPY ENDOSCOPY  10/12/2013  EXCISION ORAL LESION WITH CO2 LASER N/A 09/08/2015   Procedure: EXCISION ORAL LESION WITH CO2 LASER;  Surgeon: Jerrell Belfast, MD;  Location: [facility] OR;  Service: ENT;  Laterality: N/A;   EYE SURGERY Bilateral    cataracts, W/IOL   JOINT REPLACEMENT Right 09/02/2014   Knee   JOINT REPLACEMENT Left 2012   Knee   PATCH ANGIOPLASTY Right 10/01/2013   Procedure: PATCH ANGIOPLASTY USING HEMASHIELD FINESSE PATCH;  Surgeon: Rosetta Posner, MD;  Location: [facility];  Service: Vascular;  Laterality: Right;    removal of melonomia Left    shoulder   REPLACEMENT TOTAL KNEE Left    REVERSE SHOULDER ARTHROPLASTY Right 10/11/2016   Procedure: REVERSE RIGHT SHOULDER ARTHROPLASTY;  Surgeon: Netta Cedars, MD;  Location: [facility];  Service: Orthopedics;  Laterality: Right;   REVERSE SHOULDER ARTHROPLASTY Left 12/03/2019   Procedure: REVERSE SHOULDER ARTHROPLASTY;  Surgeon: Netta Cedars, MD;  Location: [facility];  Service: Orthopedics;  Laterality: Left;  interscalene block   RHINOPLASTY     1999   RHINOPLASTY     SKIN CANCER EXCISION     squamous cell cancer under tongue   SKIN SURGERY     squamous cell carcinoma removed in various places,melanoma removed from L shoulder   sleep apnea surgery  2000   SPINAL FUSION N/A 11/30/2020   STERIOD INJECTION Left 10/11/2016   Procedure: STEROID INJECTION LEFT SHOULDER;  Surgeon: Netta Cedars, MD;  Location: [facility];  Service: Orthopedics;  Laterality: Left;   TOTAL HIP ARTHROPLASTY Left 10/01/2017   Procedure: LEFT TOTAL HIP ARTHROPLASTY ANTERIOR APPROACH;  Surgeon: Gaynelle Arabian, MD;  Location: [facility];  Service: Orthopedics;  Laterality: Left;   TOTAL KNEE ARTHROPLASTY Right 09/02/2014   Procedure: RIGHT TOTAL KNEE ARTHROPLASTY;  Surgeon: Netta Cedars, MD;  Location: [facility];  Service: Orthopedics;  Laterality: Right;    Family History  Problem Relation Age of Onset   Diverticulosis Mother    Skin cancer Mother    Varicose Veins Mother    Heart attack Mother    Hyperlipidemia Father    Heart disease Father    Heart attack Father    Colon cancer Neg Hx    Esophageal cancer Neg Hx    Rectal cancer Neg Hx    Stomach cancer Neg Hx     Social History   Socioeconomic History   Marital status: Divorced    Spouse name: Not on file   Number of children: 2   Years of education: Not on file   Highest education level: Not on file  Occupational History   Occupation: real Government social research officer: [employer]  Tobacco Use   Smoking status: Light  Smoker    Types: Cigars   Smokeless tobacco: Never   Tobacco comments:    pt states he smokes a couple of cigars per day. Never smoke cigarettes.  1 in the afternoon with an adult beverage and one after a meal.  Vaping Use   Vaping Use: Never used  Substance and Sexual Activity   Alcohol use: Yes    Alcohol/week: 21.0 standard drinks of alcohol    Types: 7 Glasses of wine, 14 Shots of liquor per week    Comment: weekly   Drug use: No   Sexual activity: Not on file  Other Topics Concern   Not on file  Social History Narrative   Not on file   Social Determinants of Health   Financial Resource Strain: Not on file  Food Insecurity: Not on file  Transportation Needs: Not on file  Physical Activity: Not on file  Stress: Not on file  Social Connections: Not on file  Intimate Partner Violence: Not on file     Prior to Admission medications   Medication Sig Start Date End Date Taking? Authorizing Provider  acidophilus (RISAQUAD) CAPS capsule Take 1 capsule by mouth daily.    [provider]  Camphor-Menthol-Methyl Sal (SALONPAS) 3.05-25-08 % PTCH Apply 1 application topically daily as needed (kneck pain).    [provider]  cholecalciferol (VITAMIN D) 1000 units tablet Take 1,000 Units by mouth daily.    [provider]  ELIQUIS 2.5 MG TABS tablet TAKE ONE TABLET TWICE DAILY 08/27/21   Brand Males, MD  esomeprazole (NEXIUM) 40 MG capsule Take 40 mg by mouth daily as needed (acid reflux).    [provider]  fenofibrate (TRICOR) 145 MG tablet Take 145 mg by mouth daily before breakfast.     [provider]  finasteride (PROSCAR) 5 MG tablet Take 5 mg by mouth daily. 10/07/19   [provider]  fluticasone (FLONASE SENSIMIST) 27.5 MCG/SPRAY nasal spray Place 1 spray into the nose as needed.    [provider]  fluticasone-salmeterol (ADVAIR) 100-50 MCG/ACT AEPB Inhale 1 puff into the lungs 2 (two) times daily. 11/22/21    Brand Males, MD  furosemide (LASIX) 20 MG tablet Take 1 every other day alternating with 40 mg 03/14/22   Skeet Latch, MD  furosemide (LASIX) 40 MG tablet Please alternate '20mg'$  and '40mg'$  every other day. 03/13/22   Loel Dubonnet, NP  lipase/protease/amylase (CREON) 36000 UNITS CPEP capsule Take 2 capsules (72,000 Units total) by mouth 3 (three) times daily with meals. May also take 1 capsule (36,000 Units total) as needed (with snacks). 02/02/21   Noralyn Pick, NP  loratadine (CLARITIN) 10 MG tablet Take 10 mg by mouth daily as needed for allergies.    [provider]  losartan (COZAAR) 50 MG tablet Take 1 tablet (50 mg total) by mouth daily. 03/04/22 02/27/23  Loel Dubonnet, NP  metoprolol succinate (TOPROL-XL) 50 MG 24 hr tablet Take 1 tablet (50 mg total) by mouth daily. Take 2 daily 08/28/21   Marylu Lund., NP  NON FORMULARY Place 1,000 mg under the tongue daily. CBD Oil    [provider]  Omega-3 Fatty Acids (FISH OIL) 1000 MG CAPS Take 1,000 mg by mouth daily.    [provider]  probenecid (BENEMID) 500 MG tablet Take 500 mg by mouth daily before breakfast.     [provider]  rosuvastatin (CRESTOR) 10 MG tablet Take 10 mg by mouth daily.    [provider]  sildenafil (VIAGRA) 25 MG tablet Take 25 mg by mouth daily as needed for erectile dysfunction.    [provider]  tamsulosin (FLOMAX) 0.4 MG CAPS capsule Take 0.4 mg by mouth daily.     [provider]    Allergies  Allergen Reactions   Cefaclor Other (See Comments)     skin split   Penicillins Rash    Has patient had a PCN reaction causing immediate rash, facial/tongue/throat swelling, SOB or lightheadedness with hypotension:unsure Has patient had a PCN reaction causing severe rash involving mucus membranes or skin necrosis:unsure Has patient had a PCN reaction that required hospitalization:No Has patient had a PCN reaction  occurring within the last 10 years:No--childhood allergy If all of the above answers are "NO", then  may proceed with Cephalosporin use.     REVIEW OF SYSTEMS:  General: no fevers/chills/night sweats Eyes: no blurry vision, diplopia, or amaurosis ENT: no sore throat or hearing loss Resp: no cough, wheezing, or hemoptysis CV: no edema or palpitations GI: no abdominal pain, nausea, vomiting, diarrhea, or constipation GU: no dysuria, frequency, or hematuria Skin: no rash Neuro: no headache, numbness, tingling, or weakness of extremities Musculoskeletal: no joint pain or swelling Heme: no bleeding, DVT, or easy bruising Endo: no polydipsia or polyuria  BP 90/64   Pulse 86   Ht '5\' 10"'$  (1.778 m)   Wt 181 lb (82.1 kg)   SpO2 96%   BMI 25.97 kg/m   PHYSICAL EXAM: GEN:  AO x 3 in no acute distress HEENT: normal Dentition: Normal Neck: JVP normal. +2 carotid upstrokes without bruits. No thyromegaly. Lungs: equal expansion, clear bilaterally CV: Apex is discrete and nondisplaced, RRR with 3/6 SEM Abd: soft, non-tender, non-distended; no bruit; positive bowel sounds Ext: no edema, ecchymoses, or cyanosis Vascular: 2+ femoral pulses, 2+ radial pulse on the right and +1 on left  Skin: warm and dry without rash Neuro: CN II-XII grossly intact; motor and sensory grossly intact    DATA AND STUDIES:  EKG: Sinus rhythm with PACs  2D ECHO: September 2023  1. Left ventricular ejection fraction, by estimation, is 60 to 65%. The  left ventricle has normal function. The left ventricle has no regional  wall motion abnormalities. There is moderate left ventricular hypertrophy.  Left ventricular diastolic  parameters are indeterminate.   2. Right ventricular systolic function is normal. The right ventricular  size is normal. Tricuspid regurgitation signal is inadequate for assessing  PA pressure.   3. Left atrial size was mildly dilated.   4. The mitral valve is degenerative. Mild to  moderate mitral valve  regurgitation. Severe mitral annular calcification.   5. Aortic dilatation noted. 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   6. The aortic valve is calcified. There is severe calcifcation of the  aortic valve. Aortic valve regurgitation is mild. Severe aortic valve  stenosis. Vmax 3.8 m/s, MG 37 mmHg, AVA 0.8 cm^2, DI 0.24   CARDIAC CATH: n/a  STS RISK CALCULATOR: pending  NHYA CLASS: 1    ASSESSMENT AND PLAN:   Aortic valve stenosis, etiology of cardiac valve disease unspecified - Plan: ECHOCARDIOGRAM COMPLETE  Aneurysm of ascending aorta without rupture (HCC)  Paroxysmal atrial fibrillation (HCC) - Plan: CBC  Stage 3b chronic kidney disease ([place]) - Plan: Basic metabolic panel  Aortic atherosclerosis (HCC)  Coronary artery calcification seen on CAT scan  Pre-procedure lab exam - Plan: CBC, Basic metabolic panel  The patient has developed severe but clinically asymptomatic aortic stenosis stage D2 with a preserved ejection fraction.  I had a long conversation with the patient.  I feel that he probably is not exerting himself to the degree to elicit symptoms given his diffuse osteoarthritis.  He fortunately has not developed any worrisome symptoms of presyncope or syncope.  His ejection fraction again is preserved.  We have a long conversation about potential options here including watchful waiting.  The patient is interested in an intervention when it is appropriate.  He is again relatively asymptomatic and so I think it makes sense to surveilled him for now.  Because he is interested in completing the evaluation I will refer him for cardiac catheterization, CT scan, and we will defer referral to cardiothoracic surgery for surgical opinion at this time.  I will see him back in 3 months with a echocardiogram.  I have asked him to closely monitor his symptoms and to let us know if he develops any presyncope, syncope, worsening shortness of breath, or  chest discomfort.  He agrees with this strategy.  I have personally reviewed the patients imaging data as summarized above.  I have reviewed the natural history of aortic stenosis with the patient and family members who are present today. We have discussed the limitations of medical therapy and the poor prognosis associated with symptomatic aortic stenosis. We have also reviewed potential treatment options, including palliative medical therapy, conventional surgical aortic valve replacement, and transcatheter aortic valve replacement. We discussed treatment options in the context of this patient's specific comorbid medical conditions.   All of the patient's questions were answered today. Will make further recommendations based on the results of studies outlined above.   Total time spent with patient today 60 minutes. This includes reviewing records, evaluating the patient and coordinating care.   Early Osmond, MD  03/18/2022 8:47 AM    [facility] [address], [place], [place]  [address] Phone: [phone]; Fax: [phone]

## 2022-03-18 NOTE — Telephone Encounter (Signed)
Patient called stating [name] called him to schedule a cath.

## 2022-03-20 ENCOUNTER — Telehealth: Payer: Self-pay | Admitting: *Deleted

## 2022-03-20 NOTE — Telephone Encounter (Signed)
03/18/22 GFR 38 Per Cath Lab protocol GFR < 45: -I have arranged 4 hours pre-procedure hydration-arrive 5:30 AM for 10:30 AM cath.  -I have asked patient to hold lasix and losartan the day before and day of cath.  I have reviewed pre-procedure hydration, new cath time to allow 4 hours hydration, new medication instructions with patient. I will also send letter with these instructions via MyChart at patient's request.

## 2022-03-26 ENCOUNTER — Other Ambulatory Visit: Payer: Self-pay

## 2022-03-26 DIAGNOSIS — D492 Neoplasm of unspecified behavior of bone, soft tissue, and skin: Secondary | ICD-10-CM | POA: Diagnosis not present

## 2022-03-26 DIAGNOSIS — I350 Nonrheumatic aortic (valve) stenosis: Secondary | ICD-10-CM

## 2022-03-26 DIAGNOSIS — L570 Actinic keratosis: Secondary | ICD-10-CM | POA: Diagnosis not present

## 2022-03-28 ENCOUNTER — Telehealth: Payer: Self-pay | Admitting: *Deleted

## 2022-03-28 NOTE — Telephone Encounter (Addendum)
Cardiac Catheterization scheduled at [facility] for: Friday March 29, 2022 10:30 AM Arrival time and place: [facility] En[facility] A at: 5:30 AM-pre-procedure hydration  Nothing to eat after midnight prior to procedure, clear liquids until 5 AM day of procedure.  Medication instructions: -Hold:  Losartan/Lasix day before and day of procedure -per protocol GFR 38  Eliquis-none 03/27/22 until post procedure -Except hold medications usual morning medications can be taken with sips of water including aspirin 81 mg.  Confirmed patient has responsible adult to drive home post procedure and be with patient first 24 hours after arriving home.  Patient reports no new symptoms concerning for COVID-19 in the past 10 days.  Reviewed procedure instructions with patient.

## 2022-03-29 ENCOUNTER — Ambulatory Visit (HOSPITAL_COMMUNITY)
Admission: RE | Admit: 2022-03-29 | Discharge: 2022-03-29 | Disposition: A | Discharge status: Home / Self Care | Payer: Medicare Other | Attending: Internal Medicine | Admitting: Internal Medicine

## 2022-03-29 ENCOUNTER — Other Ambulatory Visit: Payer: Self-pay

## 2022-03-29 ENCOUNTER — Encounter (HOSPITAL_COMMUNITY)
Admission: RE | Disposition: A | Discharge status: Home / Self Care | Payer: Self-pay | Source: Home / Self Care | Attending: Internal Medicine

## 2022-03-29 DIAGNOSIS — F17290 Nicotine dependence, other tobacco product, uncomplicated: Secondary | ICD-10-CM | POA: Diagnosis not present

## 2022-03-29 DIAGNOSIS — I129 Hypertensive chronic kidney disease with stage 1 through stage 4 chronic kidney disease, or unspecified chronic kidney disease: Secondary | ICD-10-CM | POA: Insufficient documentation

## 2022-03-29 DIAGNOSIS — I480 Paroxysmal atrial fibrillation: Secondary | ICD-10-CM | POA: Insufficient documentation

## 2022-03-29 DIAGNOSIS — J449 Chronic obstructive pulmonary disease, unspecified: Secondary | ICD-10-CM | POA: Diagnosis not present

## 2022-03-29 DIAGNOSIS — M1990 Unspecified osteoarthritis, unspecified site: Secondary | ICD-10-CM | POA: Diagnosis not present

## 2022-03-29 DIAGNOSIS — I7121 Aneurysm of the ascending aorta, without rupture: Secondary | ICD-10-CM | POA: Insufficient documentation

## 2022-03-29 DIAGNOSIS — I2510 Atherosclerotic heart disease of native coronary artery without angina pectoris: Secondary | ICD-10-CM | POA: Diagnosis not present

## 2022-03-29 DIAGNOSIS — I350 Nonrheumatic aortic (valve) stenosis: Secondary | ICD-10-CM | POA: Insufficient documentation

## 2022-03-29 DIAGNOSIS — N1832 Chronic kidney disease, stage 3b: Secondary | ICD-10-CM | POA: Insufficient documentation

## 2022-03-29 DIAGNOSIS — I700 Atherosclerosis of aorta: Secondary | ICD-10-CM | POA: Insufficient documentation

## 2022-03-29 DIAGNOSIS — E785 Hyperlipidemia, unspecified: Secondary | ICD-10-CM | POA: Diagnosis not present

## 2022-03-29 HISTORY — PX: RIGHT/LEFT HEART CATH AND CORONARY ANGIOGRAPHY: CATH118266

## 2022-03-29 LAB — POCT I-STAT EG7
Acid-base deficit: 2 mmol/L (ref 0.0–2.0)
Acid-base deficit: 2 mmol/L (ref 0.0–2.0)
Bicarbonate: 23.3 mmol/L (ref 20.0–28.0)
Bicarbonate: 23.3 mmol/L (ref 20.0–28.0)
Calcium, Ion: 1.19 mmol/L (ref 1.15–1.40)
Calcium, Ion: 1.22 mmol/L (ref 1.15–1.40)
HCT: 35 % — ABNORMAL LOW (ref 39.0–52.0)
HCT: 35 % — ABNORMAL LOW (ref 39.0–52.0)
Hemoglobin: 11.9 g/dL — ABNORMAL LOW (ref 13.0–17.0)
Hemoglobin: 11.9 g/dL — ABNORMAL LOW (ref 13.0–17.0)
O2 Saturation: 65 %
O2 Saturation: 80 %
Potassium: 3.8 mmol/L (ref 3.5–5.1)
Potassium: 3.8 mmol/L (ref 3.5–5.1)
Sodium: 139 mmol/L (ref 135–145)
Sodium: 140 mmol/L (ref 135–145)
TCO2: 24 mmol/L (ref 22–32)
TCO2: 24 mmol/L (ref 22–32)
pCO2, Ven: 39.1 mmHg — ABNORMAL LOW (ref 44–60)
pCO2, Ven: 39.1 mmHg — ABNORMAL LOW (ref 44–60)
pH, Ven: 7.383 (ref 7.25–7.43)
pH, Ven: 7.383 (ref 7.25–7.43)
pO2, Ven: 34 mmHg (ref 32–45)
pO2, Ven: 45 mmHg (ref 32–45)

## 2022-03-29 LAB — POCT I-STAT 7, (LYTES, BLD GAS, ICA,H+H)
Acid-base deficit: 3 mmol/L — ABNORMAL HIGH (ref 0.0–2.0)
Bicarbonate: 21.5 mmol/L (ref 20.0–28.0)
Calcium, Ion: 1.2 mmol/L (ref 1.15–1.40)
HCT: 34 % — ABNORMAL LOW (ref 39.0–52.0)
Hemoglobin: 11.6 g/dL — ABNORMAL LOW (ref 13.0–17.0)
O2 Saturation: 91 %
Potassium: 3.8 mmol/L (ref 3.5–5.1)
Sodium: 138 mmol/L (ref 135–145)
TCO2: 23 mmol/L (ref 22–32)
pCO2 arterial: 34 mmHg (ref 32–48)
pH, Arterial: 7.41 (ref 7.35–7.45)
pO2, Arterial: 59 mmHg — ABNORMAL LOW (ref 83–108)

## 2022-03-29 SURGERY — RIGHT/LEFT HEART CATH AND CORONARY ANGIOGRAPHY
Anesthesia: LOCAL

## 2022-03-29 MED ORDER — IOHEXOL 350 MG/ML SOLN
INTRAVENOUS | Status: DC | PRN
  Administered 2022-03-29: 55 mL

## 2022-03-29 MED ORDER — VERAPAMIL HCL 2.5 MG/ML IV SOLN
INTRAVENOUS | Status: AC
  Filled 2022-03-29: qty 2

## 2022-03-29 MED ORDER — SODIUM CHLORIDE 0.9 % IV SOLN: 250.0000 mL | INTRAVENOUS | Status: DC | PRN

## 2022-03-29 MED ORDER — VERAPAMIL HCL 2.5 MG/ML IV SOLN
INTRAVENOUS | Status: DC | PRN
  Administered 2022-03-29: 10 mL via INTRA_ARTERIAL

## 2022-03-29 MED ORDER — SODIUM CHLORIDE 0.9 % IV SOLN: INTRAVENOUS | Status: DC

## 2022-03-29 MED ORDER — FENTANYL CITRATE (PF) 100 MCG/2ML IJ SOLN
INTRAMUSCULAR | Status: AC
  Filled 2022-03-29: qty 2

## 2022-03-29 MED ORDER — FENTANYL CITRATE (PF) 100 MCG/2ML IJ SOLN
INTRAMUSCULAR | Status: DC | PRN
  Administered 2022-03-29: 25 ug via INTRAVENOUS

## 2022-03-29 MED ORDER — MIDAZOLAM HCL 2 MG/2ML IJ SOLN
INTRAMUSCULAR | Status: AC
  Filled 2022-03-29: qty 2

## 2022-03-29 MED ORDER — HEPARIN SODIUM (PORCINE) 1000 UNIT/ML IJ SOLN
INTRAMUSCULAR | Status: DC | PRN
  Administered 2022-03-29: 5000 [IU] via INTRAVENOUS

## 2022-03-29 MED ORDER — LIDOCAINE HCL (PF) 1 % IJ SOLN
INTRAMUSCULAR | Status: DC | PRN
  Administered 2022-03-29 (×2): 2 mL

## 2022-03-29 MED ORDER — SODIUM CHLORIDE 0.9 % WEIGHT BASED INFUSION
3.0000 mL/kg/h | INTRAVENOUS | Status: AC
  Administered 2022-03-29: 3 mL/kg/h via INTRAVENOUS

## 2022-03-29 MED ORDER — MIDAZOLAM HCL 2 MG/2ML IJ SOLN
INTRAMUSCULAR | Status: DC | PRN
  Administered 2022-03-29: 1 mg via INTRAVENOUS

## 2022-03-29 MED ORDER — SODIUM CHLORIDE 0.9% FLUSH: 3.0000 mL | INTRAVENOUS | Status: DC | PRN

## 2022-03-29 MED ORDER — LABETALOL HCL 5 MG/ML IV SOLN: 10.0000 mg | INTRAVENOUS | Status: DC | PRN

## 2022-03-29 MED ORDER — ONDANSETRON HCL 4 MG/2ML IJ SOLN: 4.0000 mg | Freq: Four times a day (QID) | INTRAMUSCULAR | Status: DC | PRN

## 2022-03-29 MED ORDER — LIDOCAINE HCL (PF) 1 % IJ SOLN
INTRAMUSCULAR | Status: AC
  Filled 2022-03-29: qty 30

## 2022-03-29 MED ORDER — HEPARIN (PORCINE) IN NACL 1000-0.9 UT/500ML-% IV SOLN
INTRAVENOUS | Status: AC
  Filled 2022-03-29: qty 1000

## 2022-03-29 MED ORDER — SODIUM CHLORIDE 0.9 % WEIGHT BASED INFUSION
1.0000 mL/kg/h | INTRAVENOUS | Status: DC
  Administered 2022-03-29: 1 mL/kg/h via INTRAVENOUS

## 2022-03-29 MED ORDER — ASPIRIN 81 MG PO CHEW: 81.0000 mg | CHEWABLE_TABLET | ORAL | Status: DC

## 2022-03-29 MED ORDER — SODIUM CHLORIDE 0.9% FLUSH: 3.0000 mL | Freq: Two times a day (BID) | INTRAVENOUS | Status: DC

## 2022-03-29 MED ORDER — ACETAMINOPHEN 325 MG PO TABS: 650.0000 mg | ORAL_TABLET | ORAL | Status: DC | PRN

## 2022-03-29 MED ORDER — HYDRALAZINE HCL 20 MG/ML IJ SOLN: 10.0000 mg | INTRAMUSCULAR | Status: DC | PRN

## 2022-03-29 MED ORDER — HEPARIN (PORCINE) IN NACL 1000-0.9 UT/500ML-% IV SOLN
INTRAVENOUS | Status: DC | PRN
  Administered 2022-03-29 (×2): 500 mL

## 2022-03-29 MED ORDER — HEPARIN SODIUM (PORCINE) 1000 UNIT/ML IJ SOLN
INTRAMUSCULAR | Status: AC
  Filled 2022-03-29: qty 10

## 2022-03-29 SURGICAL SUPPLY — 14 items
CATH DIAG 6FR JR4 (CATHETERS) IMPLANT
CATH INFINITI 6F FL3.5 (CATHETERS) IMPLANT
CATH OPTITORQUE TIG 4.0 6F (CATHETERS) IMPLANT
CATH SWAN GANZ 7F STRAIGHT (CATHETERS) IMPLANT
DEVICE RAD COMP TR BAND LRG (VASCULAR PRODUCTS) IMPLANT
GLIDESHEATH SLEND SS 6F .021 (SHEATH) IMPLANT
GLIDESHEATH SLENDER 7FR .021G (SHEATH) IMPLANT
KIT HEART LEFT (KITS) ×1 IMPLANT
PACK CARDIAC CATHETERIZATION (CUSTOM PROCEDURE TRAY) ×2 IMPLANT
SHEATH PROBE COVER 6X72 (BAG) IMPLANT
TRANSDUCER W/STOPCOCK (MISCELLANEOUS) ×2 IMPLANT
TUBING CIL FLEX 10 FLL-RA (TUBING) ×1 IMPLANT
WIRE EMERALD 3MM-J .025X260CM (WIRE) IMPLANT
WIRE EMERALD 3MM-J .035X260CM (WIRE) IMPLANT

## 2022-03-29 NOTE — Interval H&P Note (Signed)
History and Physical Interval Note:  03/29/2022 7:47 AM  [name]  has presented today for surgery, with the diagnosis of aortic stenosis.  The various methods of treatment have been discussed with the patient and family. After consideration of risks, benefits and other options for treatment, the patient has consented to  Procedure(s): RIGHT/LEFT HEART CATH AND CORONARY ANGIOGRAPHY (N/A) as a surgical intervention.  The patient's history has been reviewed, patient examined, no change in status, stable for surgery.  I have reviewed the patient's chart and labs.  Questions were answered to the patient's satisfaction.     Early Osmond

## 2022-03-29 NOTE — Discharge Instructions (Signed)

## 2022-04-01 ENCOUNTER — Encounter (HOSPITAL_COMMUNITY): Payer: Self-pay | Admitting: Internal Medicine

## 2022-04-01 ENCOUNTER — Other Ambulatory Visit: Payer: Self-pay

## 2022-04-01 DIAGNOSIS — I350 Nonrheumatic aortic (valve) stenosis: Secondary | ICD-10-CM

## 2022-04-01 DIAGNOSIS — N289 Disorder of kidney and ureter, unspecified: Secondary | ICD-10-CM

## 2022-04-02 ENCOUNTER — Ambulatory Visit (INDEPENDENT_AMBULATORY_CARE_PROVIDER_SITE_OTHER): Payer: Medicare Other | Admitting: Nurse Practitioner

## 2022-04-02 ENCOUNTER — Other Ambulatory Visit (INDEPENDENT_AMBULATORY_CARE_PROVIDER_SITE_OTHER): Payer: Medicare Other

## 2022-04-02 ENCOUNTER — Encounter: Payer: Self-pay | Admitting: Nurse Practitioner

## 2022-04-02 VITALS — BP 96/62 | HR 99 | Ht 70.0 in | Wt 181.0 lb

## 2022-04-02 DIAGNOSIS — R195 Other fecal abnormalities: Secondary | ICD-10-CM | POA: Diagnosis not present

## 2022-04-02 DIAGNOSIS — Z860100 Personal history of colon polyps, unspecified: Secondary | ICD-10-CM

## 2022-04-02 DIAGNOSIS — D649 Anemia, unspecified: Secondary | ICD-10-CM

## 2022-04-02 LAB — COMPREHENSIVE METABOLIC PANEL
ALT: 14 U/L (ref 0–53)
AST: 18 U/L (ref 0–37)
Albumin: 3.8 g/dL (ref 3.5–5.2)
Alkaline Phosphatase: 30 U/L — ABNORMAL LOW (ref 39–117)
BUN: 20 mg/dL (ref 6–23)
CO2: 27 mEq/L (ref 19–32)
Calcium: 9.1 mg/dL (ref 8.4–10.5)
Chloride: 101 mEq/L (ref 96–112)
Creatinine, Ser: 1.59 mg/dL — ABNORMAL HIGH (ref 0.40–1.50)
GFR: 40.96 mL/min — ABNORMAL LOW (ref >60.00)
Glucose, Bld: 111 mg/dL — ABNORMAL HIGH (ref 70–99)
Potassium: 3.9 mEq/L (ref 3.5–5.1)
Sodium: 137 mEq/L (ref 135–145)
Total Bilirubin: 0.8 mg/dL (ref 0.2–1.2)
Total Protein: 6.7 g/dL (ref 6.0–8.3)

## 2022-04-02 LAB — CBC
HCT: 39.8 % (ref 39.0–52.0)
Hemoglobin: 13.2 g/dL (ref 13.0–17.0)
MCHC: 33.2 g/dL (ref 30.0–36.0)
MCV: 93.7 fl (ref 78.0–100.0)
Platelets: 143 10*3/uL — ABNORMAL LOW (ref 150.0–400.0)
RBC: 4.25 Mil/uL (ref 4.22–5.81)
RDW: 14.9 % (ref 11.5–15.5)
WBC: 8.8 10*3/uL (ref 4.0–10.5)

## 2022-04-02 LAB — IBC + FERRITIN
Ferritin: 271.3 ng/mL (ref 22.0–322.0)
Iron: 65 ug/dL (ref 42–165)
Saturation Ratios: 16 % — ABNORMAL LOW (ref 20.0–50.0)
TIBC: 407.4 ug/dL (ref 250.0–450.0)
Transferrin: 291 mg/dL (ref 212.0–360.0)

## 2022-04-02 LAB — B12 AND FOLATE PANEL
Folate: 14.1 ng/mL (ref >5.9)
Vitamin B-12: 231 pg/mL (ref 211–911)

## 2022-04-02 NOTE — Patient Instructions (Addendum)
Your provider has requested that you go to the basement level for lab work before leaving today. Press "B" on the elevator. The lab is located at the first door on the left as you exit the elevator.   Further GI evaluation to be determined after your labs results have been reviewed.  Due to recent changes in healthcare laws, you may see the results of your imaging and laboratory studies on MyChart before your provider has had a chance to review them.  We understand that in some cases there may be results that are confusing or concerning to you. Not all laboratory results come back in the same time frame and the provider may be waiting for multiple results in order to interpret others.  Please give [place] 48 hours in order for your provider to thoroughly review all the results before contacting the office for clarification of your results.    It was a pleasure to see you today!  Thank you for trusting me with your gastrointestinal care!

## 2022-04-02 NOTE — Progress Notes (Signed)
04/02/2022 [name] [id number] Nov 09, 1940   Chief Complaint: Heme positive stool   History of Present Illness:  [name]. 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  He is followed by Dr. Havery Moros. He presents today for further evaluation regarding heme positive stool. He presents to our office today as referred by Dr. Shon Baton for further evaluation regarding heme positive stool.  He denies having any dysphagia or heartburn.  He is passing a normal formed brown bowel movement most days without straining.  He occasionally has loose stools which he attributes to having pancreatic insufficiency.  He takes Creon as prescribed.  He reported completing FOBT test following his physical with Dr. Virgina Jock 01/2022.  He later submitted stool cards and was informed one of the cards was heme positive.  His hemoglobin level was 13.8 on 01/22/2022.  Hemoglobin level 11.9 on 03/29/2022 which is collected at the time of his cardiac catheterization.  See cardiac update below.  His most recent colonoscopy was 01/09/2019 which identified right and left colon diverticulosis, 3 tubular adenomatous/hyperplastic polyps removed from the transverse colon and rectum and internal hemorrhoids.  He underwent an EGD 09/2013 showed evidence of NSAID induced gastritis.  He is followed by cardiology secondary to having severe aortic stenosis, paroxysmal atrial fibrillation and coronary artery disease.  His most recent ECHO was 01/24/2022 which showed LVEF 60 to 65% and showed severe aortic stenosis.  He underwent a cardiac catheterization  on 03/29/2022 which showed mild to moderate coronary artery disease with 50 to 60% lesion of RCA.  He remains asymptomatic in regard to his severe aortic stenosis.  He continues to work full-time.  He denies having any chest pain or dyspnea with exertion. He was last seen by his cardiologist Dr. Ali Lowe 03/18/2022, at that time, her thoracic referral for future AVR was deferred as he remains asymptomatic without chest pain or dyspnea with exertion and a repeat echo in 3 months was planned.  Labs 01/22/2022: WBC 11.6.  Hemoglobin 13.8.  Hematocrit 39.3.  Platelet 149.  TSH 0.94.  Hemoglobin A1c 5.5.  Glucose 92.  Sodium 140.  Potassium 3.7.  BUN 20.  Creatinine 1.3.  Alk phos 33.  Total bili 1.2.  AST 18.  ALT 15. IFOBT positive.   Labs 02/11/2022: Hemoccult positive      Latest Ref Rng & Units 03/29/2022   12:36 PM 03/29/2022   12:29 PM 03/29/2022   12:24 PM  CBC  Hemoglobin 13.0 - 17.0 g/dL 11.6  11.9  11.9   Hematocrit 39.0 - 52.0 % 34.0  35.0  35.0         Latest Ref Rng & Units 03/29/2022   12:36 PM 03/29/2022   12:29 PM 03/29/2022   12:24 PM  CMP  Sodium 135 - 145 mmol/L 138  140  139   Potassium 3.5 - 5.1 mmol/L 3.8  3.8  3.8     Cardiac catheterization 03/29/2022:   Dist RCA lesion is 50% stenosed.  1.  Mild to moderate diffuse coronary artery disease with the focal 50 to 60% lesion of the mid right coronary. 2.  Fick cardiac output of 12.8 L/min and Fick cardiac  index of 6.4 L/min/m with mean RA pressure of 7 mmHg, RV pressure of 31/8 with a end-diastolic pressure of 9 mmHg, mean wedge pressure of 16 mmHg, and PA pressure of 31 over 19 mmHg.  Recommendation: Continue evaluation for aortic valve intervention.  ECHO 01/24/2022: IMPRESSIONS Left ventricular ejection fraction, by estimation, is 60 to 65%. The left ventricle has normal function. The left ventricle has no regional wall motion abnormalities. There is moderate left ventricular hypertrophy. Left ventricular diastolic  parameters are indeterminate. 1. Right ventricular systolic function is normal. The right ventricular size is normal. Tricuspid regurgitation signal is inadequate for assessing PA pressure. 2. 3. Left atrial size was mildly dilated. The mitral valve is degenerative. Mild to moderate mitral valve regurgitation. Severe mitral annular calcification. 4. 5. Aortic dilatation noted. Aneurysm of the ascending aorta, measuring 46 mm. The aortic valve is calcified. There is severe calcifcation of the aortic valve. Aortic valve regurgitation is mild. Severe aortic valve stenosis. Vmax 3.8 m/s, MG 37 mmHg, AVA 0.8 cm^2, DI 0.24  GI PROCEDURES:  Colonoscopy 01/09/2019: - Two 3 to 4 mm polyps in the transverse colon, removed with a cold snare. Resected and retrieved. - One diminutive polyp in the rectum, removed with a cold biopsy forceps. Resected and retrieved. - Diverticulosis in the left colon and in the right colon. - Internal hemorrhoids. - The examination was otherwise normal. - 5 year colonoscopy recall if medically appropriate  Surgical [P], colon, transverse, rectal, polyp (3) - TUBULAR ADENOMA(S) WITHOUT HIGH-GRADE DYSPLASIA OR MALIGNANCY - HYPERPLASTIC POLYP  Colonoscopy 11/05/2017: - Preparation of the colon was inadequate. - Stool in the entire examined colon. - One 3 mm polyp in the ascending colon, removed with a cold snare. Resected and retrieved. - One 4 mm polyp in the transverse colon, removed with a cold snare. Resected and retrieved. - Diverticulosis in the left colon. - Internal hemorrhoids. BENIGN COLONIC MUCOSA (2 OF 2 FRAGMENTS  Colonoscopy 12/12/2015: - Preparation of the colon was inadequate, although adequate views in the cecum were obtained - One 4 mm polyp in the cecum, removed with a cold snare. Resected and retrieved. - Diverticulosis in the left colon. - Non-bleeding internal hemorrhoids. - The examination was otherwise normal - TUBULAR ADENOMA. NO  HIGH GRADE DYSPLASIA OR MALIGNANCY IDENTIFIED.  Colonoscopy 10/11/2014: 1. Moderate diverticulosis was noted in the descending colon and sigmoid colon 2. Sessile polyp was found at the cecum; saline was given to lift the mucosal wall.; polypectomy was performed using snare cautery; by placing hemoclips 3. Sessile polyp was found at the cecum; polypectomy was performed with cold forceps 4. Internal hemorrhoids - TUBULOVILLOUS ADENOMAS. NO HIGH GRADE DYSPLASIA OR INVASIVE MALIGNANCY IDENTIFIED.  EGD 10/12/2013: 1. In the gastric antrum there were multiple areas of subepithelial hemorrhagic mucosa. Biopsies were taken. It was moderate edema in the gastric body and cardia - NSAID-induced gastritis 2. The remainder of the upper endoscopy exam was otherwise normal BENIGN GASTRIC MUCOSA WITH MILD CHRONIC GASTRITIS. - NO INTESTINAL METAPLASIA OR HELICOBACTER PYLORI ORGANISMS IDENTIFIED  Current Outpatient Medications on File Prior to Visit  Medication Sig Dispense Refill   acidophilus (RISAQUAD) CAPS capsule Take 1 capsule by mouth daily.     Camphor-Menthol-Methyl Sal (SALONPAS) 3.05-25-08 % PTCH Apply 1 application  topically daily as needed (pain).     cholecalciferol (VITAMIN D) 1000 units tablet Take 1,000 Units by mouth daily.     ELIQUIS 2.5 MG TABS tablet TAKE ONE TABLET TWICE DAILY 60 tablet 0  fenofibrate (TRICOR) 145 MG tablet Take 145 mg by mouth daily before breakfast.      finasteride (PROSCAR) 5 MG tablet Take 5 mg by mouth daily.     fluticasone (FLONASE) 50 MCG/ACT nasal spray Place 1 spray into both nostrils daily as needed for allergies or rhinitis.     fluticasone-salmeterol (ADVAIR) 100-50 MCG/ACT AEPB Inhale 1 puff into the lungs 2 (two) times daily. 60 each 11   furosemide (LASIX) 20 MG tablet Take 1 every other day alternating with 40 mg 45 tablet 3   furosemide (LASIX) 40 MG tablet Please alternate 15m and 473mevery other day. 120 tablet 3   lipase/protease/amylase  (CREON) 36000 UNITS CPEP capsule Take 2 capsules (72,000 Units total) by mouth 3 (three) times daily with meals. May also take 1 capsule (36,000 Units total) as needed (with snacks). 240 capsule 3   loratadine (CLARITIN) 10 MG tablet Take 10 mg by mouth daily as needed for allergies.     losartan (COZAAR) 50 MG tablet Take 1 tablet (50 mg total) by mouth daily. 90 tablet 3   metoprolol succinate (TOPROL-XL) 50 MG 24 hr tablet Take 1 tablet (50 mg total) by mouth daily. Take 2 daily (Patient taking differently: Take 50 mg by mouth daily.) 90 tablet 3   NON FORMULARY Place 1,000 mg under the tongue daily. CBD Oil     Omega-3 Fatty Acids (FISH OIL) 1000 MG CAPS Take 1,000 mg by mouth daily.     probenecid (BENEMID) 500 MG tablet Take 500 mg by mouth daily before breakfast.      rosuvastatin (CRESTOR) 10 MG tablet Take 10 mg by mouth daily.     sildenafil (VIAGRA) 100 MG tablet Take 100 mg by mouth daily as needed for erectile dysfunction.     tamsulosin (FLOMAX) 0.4 MG CAPS capsule Take 0.4 mg by mouth daily.      Current Facility-Administered Medications on File Prior to Visit  Medication Dose Route Frequency Provider Last Rate Last Admin   sodium chloride flush (NS) 0.9 % injection 3 mL  3 mL Intravenous Q12H ThEarly OsmondMD       Allergies  Allergen Reactions   Cefaclor Other (See Comments)     skin split   Penicillins Rash    Has patient had a PCN reaction causing immediate rash, facial/tongue/throat swelling, SOB or lightheadedness with hypotension:unsure Has patient had a PCN reaction causing severe rash involving mucus membranes or skin necrosis:unsure Has patient had a PCN reaction that required hospitalization:No Has patient had a PCN reaction occurring within the last 10 years:No--childhood allergy If all of the above answers are "NO", then may proceed with Cephalosporin use.     Current Medications, Allergies, Past Medical History, Past Surgical History, Family History and  Social History were reviewed in [employer].   Review of Systems:   Constitutional: Negative for fever, sweats, chills or weight loss.  Respiratory: Negative for shortness of breath.   Cardiovascular: Negative for chest pain, palpitations and leg swelling.  Gastrointestinal: See HPI.  Musculoskeletal: Negative for back pain or muscle aches.  Neurological: Negative for dizziness, headaches or paresthesias.   Physical Exam: BP 96/62   Pulse 99   Ht _0  (1.778 m)   Wt 181 lb (82.1 kg)   BMI 25.97 kg/m  Note, patient's blood pressure chronically is in the 90s over 60s.  His BP was 90/60 per cardiology on 03/18/2022. General: [age]ear old male in no acute distress. Head:  Normocephalic and atraumatic. Eyes: No scleral icterus. Conjunctiva pink . Ears: Normal auditory acuity. Mouth: Dentition intact. No ulcers or lesions.  Lungs: Clear throughout to auscultation. Heart: Regular rate and rhythm. 3/6 systolic murmur. Abdomen: Soft, nontender and nondistended. No masses or hepatomegaly. Normal bowel sounds x 4 quadrants.  Rectal: Deferred. Musculoskeletal: Symmetrical with no gross deformities. Extremities: Bilateral lower extremities with 1+ edema. Neurological: Alert oriented x 4. No focal deficits.  Psychological: Alert and cooperative. Normal mood and affect  Assessment and Recommendations:  68) 81 year old with + FOBT. Baseline Hg 13.8. His Hg level dropped to 11.9 on 03/29/2022 per labs drawn at the time of his cardiac catheterization (possible dilutional component secondary to IVF).  No overt GI bleeding.  On Eliquis. His most recent colonoscopy 01/09/2019 identified right and left colon diverticulosis, 3 tubular adenomatous/hyperplastic polyps removed from the transverse colon and rectum and internal hemorrhoids.  He underwent an EGD 09/2013 showed evidence of NSAID induced gastritis. -CBC, CMP, IBC + ferritin, B12 and folate levels  -I will consult  with Dr. Havery Moros to further discuss scheduling an EGD +/- colonoscopy at [facility]. EGD/colonoscopy benefits and risks discussed including risk with sedation, risk of bleeding, perforation and infection    Cardiac clearance to include Eliquis hold instructions required prior to pursuing any endoscopic evaluation.  2) History of hyperplastic, tubular adenomatous and tubulovillous colon polyps.  Colonoscopy was 01/09/2019 which identified right and left colon diverticulosis, 3 tubular adenomatous/hyperplastic polyps removed from the transverse colon and rectum and internal hemorrhoids.    4) History of PE/DVT on Eliquis   5) Severe aortic stenosis with aortic valve area of 0.8 cm with a dimensionless index of 0.24, mean gradient of 37 mmHg, and peak velocity of 3.8 m/s with an ejection fraction of 60 to 65%.  Patient is asymptomatic therefore cardiology has deferred AVR surgery at this time with plans to repeat echo in 3 months.  6) History or paroxysmal atrial fibrillation    7) Thoracic aortic aneurysm   8) Pancreatic insufficiency, no further oily stools or weight loss on Creon  9) COPD

## 2022-04-03 NOTE — Progress Notes (Signed)
I have reviewed Mr. Trawick chart.  I called [name] to discuss this issue further with him to determine how he wants to proceed.  He has significant comorbidities as outlined in the note including moderate diffuse CAD and severe AS, COPD, on anticoagulation.  His colonoscopy was up-to-date, he has no overt bleeding, this is a patient I would not recommend any stool tests on, there is certainly risk for false positive in the setting of anticoagulation.  His CBC is normal, no anemia, his iron studies are normal.  I discussed typical work-up for this is an EGD and colonoscopy.  His colonoscopy is up-to-date as of 3 years ago and has had numerous exams in recent years due to prep issues.  Any invasive procedure for him would require his case to be done at the hospital with anesthesia support.  Further if he does a colonoscopy would be a double prep given his history of poor preps in the past.  He has had imaging of his chest abdomen pelvis with CT in recent months with no high risk lesions noted.  He tells me he has a scheduled follow-up CT scan to look at his aortic valve in the next few weeks.  Given the constellation of findings as it stands right now, I feel the risks of endoscopy and colonoscopy likely outweigh the benefits in light of his comorbidities.  We discussed this for a good period of time.  While I cannot guarantee he does not have a gastric malignancy or process like that going on however I think the risk is overall low, he has no GI symptoms that bother him and no overt bleeding and iron studies/CBC normal.  He is in agreement that these procedural / anesthesia risks probably outweigh the benefits as it stands right now.  He did ask me to look at his CT scan once he had it done in a few weeks so I can review that with him.  He will think about this in the interim and contact me once his CT scan is not done to let me know his final decision on how he wants to proceed.  All questions answered.  I  will talk with him again in the upcoming few weeks, but assuming his CT scans do not show any concerning GI pathology are likely plan we will continue observation, hold off on procedures, with no further stool testing.

## 2022-04-08 ENCOUNTER — Ambulatory Visit: Payer: Medicare Other | Attending: Internal Medicine

## 2022-04-08 DIAGNOSIS — I350 Nonrheumatic aortic (valve) stenosis: Secondary | ICD-10-CM | POA: Diagnosis not present

## 2022-04-09 LAB — BASIC METABOLIC PANEL
BUN/Creatinine Ratio: 14 (ref 10–24)
BUN: 27 mg/dL (ref 8–27)
CO2: 26 mmol/L (ref 20–29)
Calcium: 10.5 mg/dL — ABNORMAL HIGH (ref 8.6–10.2)
Chloride: 98 mmol/L (ref 96–106)
Creatinine, Ser: 1.89 mg/dL — ABNORMAL HIGH (ref 0.76–1.27)
Glucose: 100 mg/dL — ABNORMAL HIGH (ref 70–99)
Potassium: 4.6 mmol/L (ref 3.5–5.2)
Sodium: 140 mmol/L (ref 134–144)
eGFR: 36 mL/min/{1.73_m2} — ABNORMAL LOW (ref >59)

## 2022-04-15 ENCOUNTER — Encounter (HOSPITAL_COMMUNITY): Payer: Self-pay

## 2022-04-15 ENCOUNTER — Ambulatory Visit (HOSPITAL_COMMUNITY)
Admission: RE | Admit: 2022-04-15 | Discharge: 2022-04-15 | Disposition: A | Discharge status: Home / Self Care | Payer: Medicare Other | Source: Ambulatory Visit | Attending: Internal Medicine | Admitting: Internal Medicine

## 2022-04-15 ENCOUNTER — Other Ambulatory Visit (HOSPITAL_COMMUNITY): Payer: Medicare Other

## 2022-04-15 DIAGNOSIS — Z01810 Encounter for preprocedural cardiovascular examination: Secondary | ICD-10-CM | POA: Diagnosis not present

## 2022-04-15 DIAGNOSIS — I350 Nonrheumatic aortic (valve) stenosis: Secondary | ICD-10-CM | POA: Diagnosis not present

## 2022-04-15 DIAGNOSIS — N289 Disorder of kidney and ureter, unspecified: Secondary | ICD-10-CM | POA: Insufficient documentation

## 2022-04-15 MED ORDER — SODIUM CHLORIDE 0.9 % WEIGHT BASED INFUSION: 1.0000 mL/kg/h | INTRAVENOUS | Status: DC

## 2022-04-15 MED ORDER — SODIUM CHLORIDE 0.9 % WEIGHT BASED INFUSION
3.0000 mL/kg/h | INTRAVENOUS | Status: AC
  Administered 2022-04-15: 3 mL/kg/h via INTRAVENOUS

## 2022-04-15 MED ORDER — METOPROLOL TARTRATE 5 MG/5ML IV SOLN
INTRAVENOUS | Status: AC
  Filled 2022-04-15: qty 5

## 2022-04-15 MED ORDER — IOHEXOL 350 MG/ML SOLN
95.0000 mL | Freq: Once | INTRAVENOUS | Status: AC | PRN
  Administered 2022-04-15: 95 mL via INTRAVENOUS

## 2022-04-18 DIAGNOSIS — Z85820 Personal history of malignant melanoma of skin: Secondary | ICD-10-CM | POA: Diagnosis not present

## 2022-04-18 DIAGNOSIS — L905 Scar conditions and fibrosis of skin: Secondary | ICD-10-CM | POA: Diagnosis not present

## 2022-04-18 DIAGNOSIS — D225 Melanocytic nevi of trunk: Secondary | ICD-10-CM | POA: Diagnosis not present

## 2022-04-18 DIAGNOSIS — L821 Other seborrheic keratosis: Secondary | ICD-10-CM | POA: Diagnosis not present

## 2022-04-18 DIAGNOSIS — Z872 Personal history of diseases of the skin and subcutaneous tissue: Secondary | ICD-10-CM | POA: Diagnosis not present

## 2022-04-18 DIAGNOSIS — Z85828 Personal history of other malignant neoplasm of skin: Secondary | ICD-10-CM | POA: Diagnosis not present

## 2022-04-18 DIAGNOSIS — L814 Other melanin hyperpigmentation: Secondary | ICD-10-CM | POA: Diagnosis not present

## 2022-04-18 DIAGNOSIS — D0471 Carcinoma in situ of skin of right lower limb, including hip: Secondary | ICD-10-CM | POA: Diagnosis not present

## 2022-04-18 DIAGNOSIS — Z08 Encounter for follow-up examination after completed treatment for malignant neoplasm: Secondary | ICD-10-CM | POA: Diagnosis not present

## 2022-04-18 DIAGNOSIS — L570 Actinic keratosis: Secondary | ICD-10-CM | POA: Diagnosis not present

## 2022-04-18 DIAGNOSIS — Z09 Encounter for follow-up examination after completed treatment for conditions other than malignant neoplasm: Secondary | ICD-10-CM | POA: Diagnosis not present

## 2022-04-19 ENCOUNTER — Encounter: Payer: Self-pay | Admitting: *Deleted

## 2022-04-23 DIAGNOSIS — C069 Malignant neoplasm of mouth, unspecified: Secondary | ICD-10-CM | POA: Diagnosis not present

## 2022-04-23 DIAGNOSIS — H9193 Unspecified hearing loss, bilateral: Secondary | ICD-10-CM | POA: Diagnosis not present

## 2022-04-23 DIAGNOSIS — N390 Urinary tract infection, site not specified: Secondary | ICD-10-CM | POA: Diagnosis not present

## 2022-04-23 DIAGNOSIS — K1370 Unspecified lesions of oral mucosa: Secondary | ICD-10-CM | POA: Diagnosis not present

## 2022-04-25 DIAGNOSIS — R300 Dysuria: Secondary | ICD-10-CM | POA: Diagnosis not present

## 2022-04-25 DIAGNOSIS — N401 Enlarged prostate with lower urinary tract symptoms: Secondary | ICD-10-CM | POA: Diagnosis not present

## 2022-04-25 DIAGNOSIS — R3914 Feeling of incomplete bladder emptying: Secondary | ICD-10-CM | POA: Diagnosis not present

## 2022-05-03 DIAGNOSIS — R338 Other retention of urine: Secondary | ICD-10-CM | POA: Diagnosis not present

## 2022-05-07 DIAGNOSIS — R338 Other retention of urine: Secondary | ICD-10-CM | POA: Diagnosis not present

## 2022-05-07 DIAGNOSIS — N3000 Acute cystitis without hematuria: Secondary | ICD-10-CM | POA: Diagnosis not present

## 2022-05-09 DIAGNOSIS — N139 Obstructive and reflux uropathy, unspecified: Secondary | ICD-10-CM | POA: Diagnosis not present

## 2022-05-09 DIAGNOSIS — R3914 Feeling of incomplete bladder emptying: Secondary | ICD-10-CM | POA: Diagnosis not present

## 2022-05-09 DIAGNOSIS — N401 Enlarged prostate with lower urinary tract symptoms: Secondary | ICD-10-CM | POA: Diagnosis not present

## 2022-05-22 DIAGNOSIS — R338 Other retention of urine: Secondary | ICD-10-CM | POA: Diagnosis not present

## 2022-05-29 DIAGNOSIS — D492 Neoplasm of unspecified behavior of bone, soft tissue, and skin: Secondary | ICD-10-CM | POA: Diagnosis not present

## 2022-05-29 DIAGNOSIS — D0422 Carcinoma in situ of skin of left ear and external auricular canal: Secondary | ICD-10-CM | POA: Diagnosis not present

## 2022-05-29 DIAGNOSIS — L989 Disorder of the skin and subcutaneous tissue, unspecified: Secondary | ICD-10-CM | POA: Diagnosis not present

## 2022-05-29 DIAGNOSIS — R208 Other disturbances of skin sensation: Secondary | ICD-10-CM | POA: Diagnosis not present

## 2022-05-29 DIAGNOSIS — L538 Other specified erythematous conditions: Secondary | ICD-10-CM | POA: Diagnosis not present

## 2022-05-29 DIAGNOSIS — D485 Neoplasm of uncertain behavior of skin: Secondary | ICD-10-CM | POA: Diagnosis not present

## 2022-06-03 DIAGNOSIS — R338 Other retention of urine: Secondary | ICD-10-CM | POA: Diagnosis not present

## 2022-06-05 ENCOUNTER — Inpatient Hospital Stay (HOSPITAL_COMMUNITY): Admission: EM | Disposition: E | Payer: Self-pay | Source: Home / Self Care | Attending: Internal Medicine

## 2022-06-05 ENCOUNTER — Inpatient Hospital Stay (HOSPITAL_COMMUNITY): Payer: Medicare Other

## 2022-06-05 ENCOUNTER — Emergency Department (HOSPITAL_COMMUNITY): Payer: Medicare Other

## 2022-06-05 ENCOUNTER — Other Ambulatory Visit: Payer: Self-pay

## 2022-06-05 ENCOUNTER — Inpatient Hospital Stay (HOSPITAL_COMMUNITY)
Admission: EM | Admit: 2022-06-05 | Discharge: 2022-06-20 | DRG: 871 | Disposition: E | Payer: Medicare Other | Attending: Internal Medicine | Admitting: Internal Medicine

## 2022-06-05 ENCOUNTER — Encounter (HOSPITAL_COMMUNITY): Payer: Self-pay

## 2022-06-05 ENCOUNTER — Other Ambulatory Visit (HOSPITAL_COMMUNITY): Payer: Medicare Other

## 2022-06-05 DIAGNOSIS — Z515 Encounter for palliative care: Secondary | ICD-10-CM | POA: Diagnosis not present

## 2022-06-05 DIAGNOSIS — E8721 Acute metabolic acidosis: Secondary | ICD-10-CM | POA: Diagnosis present

## 2022-06-05 DIAGNOSIS — Z981 Arthrodesis status: Secondary | ICD-10-CM

## 2022-06-05 DIAGNOSIS — R739 Hyperglycemia, unspecified: Secondary | ICD-10-CM | POA: Diagnosis present

## 2022-06-05 DIAGNOSIS — Z7901 Long term (current) use of anticoagulants: Secondary | ICD-10-CM

## 2022-06-05 DIAGNOSIS — I739 Peripheral vascular disease, unspecified: Secondary | ICD-10-CM | POA: Diagnosis not present

## 2022-06-05 DIAGNOSIS — D631 Anemia in chronic kidney disease: Secondary | ICD-10-CM | POA: Diagnosis not present

## 2022-06-05 DIAGNOSIS — R6521 Severe sepsis with septic shock: Secondary | ICD-10-CM | POA: Diagnosis not present

## 2022-06-05 DIAGNOSIS — G9341 Metabolic encephalopathy: Secondary | ICD-10-CM | POA: Diagnosis present

## 2022-06-05 DIAGNOSIS — I472 Ventricular tachycardia, unspecified: Secondary | ICD-10-CM | POA: Diagnosis present

## 2022-06-05 DIAGNOSIS — Z1152 Encounter for screening for COVID-19: Secondary | ICD-10-CM

## 2022-06-05 DIAGNOSIS — I462 Cardiac arrest due to underlying cardiac condition: Secondary | ICD-10-CM | POA: Diagnosis present

## 2022-06-05 DIAGNOSIS — J939 Pneumothorax, unspecified: Secondary | ICD-10-CM | POA: Diagnosis not present

## 2022-06-05 DIAGNOSIS — J9602 Acute respiratory failure with hypercapnia: Secondary | ICD-10-CM

## 2022-06-05 DIAGNOSIS — R570 Cardiogenic shock: Secondary | ICD-10-CM | POA: Diagnosis not present

## 2022-06-05 DIAGNOSIS — N179 Acute kidney failure, unspecified: Secondary | ICD-10-CM

## 2022-06-05 DIAGNOSIS — R531 Weakness: Secondary | ICD-10-CM | POA: Diagnosis not present

## 2022-06-05 DIAGNOSIS — Z880 Allergy status to penicillin: Secondary | ICD-10-CM

## 2022-06-05 DIAGNOSIS — D689 Coagulation defect, unspecified: Secondary | ICD-10-CM

## 2022-06-05 DIAGNOSIS — I350 Nonrheumatic aortic (valve) stenosis: Secondary | ICD-10-CM | POA: Diagnosis not present

## 2022-06-05 DIAGNOSIS — Z96612 Presence of left artificial shoulder joint: Secondary | ICD-10-CM | POA: Diagnosis present

## 2022-06-05 DIAGNOSIS — R0902 Hypoxemia: Secondary | ICD-10-CM | POA: Diagnosis not present

## 2022-06-05 DIAGNOSIS — Z8349 Family history of other endocrine, nutritional and metabolic diseases: Secondary | ICD-10-CM

## 2022-06-05 DIAGNOSIS — S270XXA Traumatic pneumothorax, initial encounter: Secondary | ICD-10-CM

## 2022-06-05 DIAGNOSIS — N390 Urinary tract infection, site not specified: Secondary | ICD-10-CM | POA: Diagnosis present

## 2022-06-05 DIAGNOSIS — R34 Anuria and oliguria: Secondary | ICD-10-CM | POA: Diagnosis not present

## 2022-06-05 DIAGNOSIS — R579 Shock, unspecified: Secondary | ICD-10-CM | POA: Diagnosis not present

## 2022-06-05 DIAGNOSIS — I129 Hypertensive chronic kidney disease with stage 1 through stage 4 chronic kidney disease, or unspecified chronic kidney disease: Secondary | ICD-10-CM | POA: Diagnosis not present

## 2022-06-05 DIAGNOSIS — I442 Atrioventricular block, complete: Secondary | ICD-10-CM | POA: Diagnosis present

## 2022-06-05 DIAGNOSIS — A4151 Sepsis due to Escherichia coli [E. coli]: Secondary | ICD-10-CM | POA: Diagnosis not present

## 2022-06-05 DIAGNOSIS — J9601 Acute respiratory failure with hypoxia: Secondary | ICD-10-CM | POA: Diagnosis not present

## 2022-06-05 DIAGNOSIS — I480 Paroxysmal atrial fibrillation: Secondary | ICD-10-CM | POA: Diagnosis not present

## 2022-06-05 DIAGNOSIS — D6959 Other secondary thrombocytopenia: Secondary | ICD-10-CM | POA: Diagnosis not present

## 2022-06-05 DIAGNOSIS — N401 Enlarged prostate with lower urinary tract symptoms: Secondary | ICD-10-CM | POA: Diagnosis present

## 2022-06-05 DIAGNOSIS — R0602 Shortness of breath: Secondary | ICD-10-CM | POA: Diagnosis not present

## 2022-06-05 DIAGNOSIS — Z86718 Personal history of other venous thrombosis and embolism: Secondary | ICD-10-CM

## 2022-06-05 DIAGNOSIS — Z808 Family history of malignant neoplasm of other organs or systems: Secondary | ICD-10-CM

## 2022-06-05 DIAGNOSIS — Z7951 Long term (current) use of inhaled steroids: Secondary | ICD-10-CM

## 2022-06-05 DIAGNOSIS — M96A3 Multiple fractures of ribs associated with chest compression and cardiopulmonary resuscitation: Secondary | ICD-10-CM | POA: Diagnosis present

## 2022-06-05 DIAGNOSIS — J189 Pneumonia, unspecified organism: Secondary | ICD-10-CM | POA: Diagnosis present

## 2022-06-05 DIAGNOSIS — I2489 Other forms of acute ischemic heart disease: Secondary | ICD-10-CM | POA: Diagnosis present

## 2022-06-05 DIAGNOSIS — N1832 Chronic kidney disease, stage 3b: Secondary | ICD-10-CM | POA: Diagnosis not present

## 2022-06-05 DIAGNOSIS — J9811 Atelectasis: Secondary | ICD-10-CM | POA: Diagnosis not present

## 2022-06-05 DIAGNOSIS — K7200 Acute and subacute hepatic failure without coma: Secondary | ICD-10-CM | POA: Diagnosis present

## 2022-06-05 DIAGNOSIS — Z66 Do not resuscitate: Secondary | ICD-10-CM | POA: Diagnosis not present

## 2022-06-05 DIAGNOSIS — Z96653 Presence of artificial knee joint, bilateral: Secondary | ICD-10-CM | POA: Diagnosis present

## 2022-06-05 DIAGNOSIS — I459 Conduction disorder, unspecified: Secondary | ICD-10-CM

## 2022-06-05 DIAGNOSIS — A419 Sepsis, unspecified organism: Secondary | ICD-10-CM | POA: Diagnosis not present

## 2022-06-05 DIAGNOSIS — J95811 Postprocedural pneumothorax: Secondary | ICD-10-CM | POA: Diagnosis not present

## 2022-06-05 DIAGNOSIS — J90 Pleural effusion, not elsewhere classified: Secondary | ICD-10-CM | POA: Diagnosis not present

## 2022-06-05 DIAGNOSIS — E871 Hypo-osmolality and hyponatremia: Secondary | ICD-10-CM | POA: Diagnosis present

## 2022-06-05 DIAGNOSIS — K219 Gastro-esophageal reflux disease without esophagitis: Secondary | ICD-10-CM | POA: Diagnosis present

## 2022-06-05 DIAGNOSIS — Z881 Allergy status to other antibiotic agents status: Secondary | ICD-10-CM

## 2022-06-05 DIAGNOSIS — R918 Other nonspecific abnormal finding of lung field: Secondary | ICD-10-CM | POA: Diagnosis not present

## 2022-06-05 DIAGNOSIS — R001 Bradycardia, unspecified: Secondary | ICD-10-CM | POA: Diagnosis not present

## 2022-06-05 DIAGNOSIS — E872 Acidosis, unspecified: Secondary | ICD-10-CM

## 2022-06-05 DIAGNOSIS — M159 Polyosteoarthritis, unspecified: Secondary | ICD-10-CM | POA: Diagnosis present

## 2022-06-05 DIAGNOSIS — I130 Hypertensive heart and chronic kidney disease with heart failure and stage 1 through stage 4 chronic kidney disease, or unspecified chronic kidney disease: Secondary | ICD-10-CM | POA: Diagnosis not present

## 2022-06-05 DIAGNOSIS — Z96611 Presence of right artificial shoulder joint: Secondary | ICD-10-CM | POA: Diagnosis present

## 2022-06-05 DIAGNOSIS — I440 Atrioventricular block, first degree: Secondary | ICD-10-CM | POA: Diagnosis not present

## 2022-06-05 DIAGNOSIS — Z86711 Personal history of pulmonary embolism: Secondary | ICD-10-CM

## 2022-06-05 DIAGNOSIS — J440 Chronic obstructive pulmonary disease with acute lower respiratory infection: Secondary | ICD-10-CM | POA: Diagnosis not present

## 2022-06-05 DIAGNOSIS — E781 Pure hyperglyceridemia: Secondary | ICD-10-CM | POA: Diagnosis present

## 2022-06-05 DIAGNOSIS — I469 Cardiac arrest, cause unspecified: Secondary | ICD-10-CM | POA: Diagnosis not present

## 2022-06-05 DIAGNOSIS — I4510 Unspecified right bundle-branch block: Secondary | ICD-10-CM | POA: Diagnosis present

## 2022-06-05 DIAGNOSIS — Z85828 Personal history of other malignant neoplasm of skin: Secondary | ICD-10-CM

## 2022-06-05 DIAGNOSIS — Z452 Encounter for adjustment and management of vascular access device: Secondary | ICD-10-CM | POA: Diagnosis not present

## 2022-06-05 DIAGNOSIS — R338 Other retention of urine: Secondary | ICD-10-CM | POA: Diagnosis present

## 2022-06-05 DIAGNOSIS — N170 Acute kidney failure with tubular necrosis: Secondary | ICD-10-CM | POA: Diagnosis not present

## 2022-06-05 DIAGNOSIS — D649 Anemia, unspecified: Secondary | ICD-10-CM | POA: Diagnosis not present

## 2022-06-05 DIAGNOSIS — I712 Thoracic aortic aneurysm, without rupture, unspecified: Secondary | ICD-10-CM | POA: Diagnosis present

## 2022-06-05 DIAGNOSIS — Z79899 Other long term (current) drug therapy: Secondary | ICD-10-CM

## 2022-06-05 DIAGNOSIS — I959 Hypotension, unspecified: Secondary | ICD-10-CM | POA: Diagnosis not present

## 2022-06-05 DIAGNOSIS — J439 Emphysema, unspecified: Secondary | ICD-10-CM | POA: Diagnosis not present

## 2022-06-05 DIAGNOSIS — I2510 Atherosclerotic heart disease of native coronary artery without angina pectoris: Secondary | ICD-10-CM | POA: Diagnosis present

## 2022-06-05 DIAGNOSIS — Z96642 Presence of left artificial hip joint: Secondary | ICD-10-CM | POA: Diagnosis present

## 2022-06-05 DIAGNOSIS — F17290 Nicotine dependence, other tobacco product, uncomplicated: Secondary | ICD-10-CM | POA: Diagnosis present

## 2022-06-05 DIAGNOSIS — Z85820 Personal history of malignant melanoma of skin: Secondary | ICD-10-CM

## 2022-06-05 DIAGNOSIS — R000 Tachycardia, unspecified: Secondary | ICD-10-CM | POA: Diagnosis not present

## 2022-06-05 DIAGNOSIS — Z8249 Family history of ischemic heart disease and other diseases of the circulatory system: Secondary | ICD-10-CM

## 2022-06-05 DIAGNOSIS — E873 Alkalosis: Secondary | ICD-10-CM | POA: Diagnosis not present

## 2022-06-05 HISTORY — PX: TEMPORARY PACEMAKER: CATH118268

## 2022-06-05 HISTORY — PX: ARTERIAL LINE INSERTION: CATH118227

## 2022-06-05 LAB — CBC
HCT: 35.8 % — ABNORMAL LOW (ref 39.0–52.0)
Hemoglobin: 11.4 g/dL — ABNORMAL LOW (ref 13.0–17.0)
MCH: 31.1 pg (ref 26.0–34.0)
MCHC: 31.8 g/dL (ref 30.0–36.0)
MCV: 97.8 fL (ref 80.0–100.0)
Platelets: 119 10*3/uL — ABNORMAL LOW (ref 150–400)
RBC: 3.66 MIL/uL — ABNORMAL LOW (ref 4.22–5.81)
RDW: 14.7 % (ref 11.5–15.5)
WBC: 17.9 10*3/uL — ABNORMAL HIGH (ref 4.0–10.5)
nRBC: 0 % (ref 0.0–0.2)

## 2022-06-05 LAB — RESP PANEL BY RT-PCR (RSV, FLU A&B, COVID)  RVPGX2
Influenza A by PCR: NEGATIVE
Influenza B by PCR: NEGATIVE
Resp Syncytial Virus by PCR: NEGATIVE
SARS Coronavirus 2 by RT PCR: NEGATIVE

## 2022-06-05 LAB — COMPREHENSIVE METABOLIC PANEL
ALT: 80 U/L — ABNORMAL HIGH (ref 0–44)
ALT: 1183 U/L — ABNORMAL HIGH (ref 0–44)
AST: 170 U/L — ABNORMAL HIGH (ref 15–41)
AST: 2524 U/L — ABNORMAL HIGH (ref 15–41)
Albumin: 2.4 g/dL — ABNORMAL LOW (ref 3.5–5.0)
Albumin: 2 g/dL — ABNORMAL LOW (ref 3.5–5.0)
Alkaline Phosphatase: 28 U/L — ABNORMAL LOW (ref 38–126)
Alkaline Phosphatase: 40 U/L (ref 38–126)
Anion gap: 15 (ref 5–15)
Anion gap: 29 — ABNORMAL HIGH (ref 5–15)
BUN: 45 mg/dL — ABNORMAL HIGH (ref 8–23)
BUN: 45 mg/dL — ABNORMAL HIGH (ref 8–23)
CO2: 19 mmol/L — ABNORMAL LOW (ref 22–32)
CO2: 18 mmol/L — ABNORMAL LOW (ref 22–32)
Calcium: 8.1 mg/dL — ABNORMAL LOW (ref 8.9–10.3)
Calcium: 8.4 mg/dL — ABNORMAL LOW (ref 8.9–10.3)
Chloride: 99 mmol/L (ref 98–111)
Chloride: 99 mmol/L (ref 98–111)
Creatinine, Ser: 3.67 mg/dL — ABNORMAL HIGH (ref 0.61–1.24)
Creatinine, Ser: 3.83 mg/dL — ABNORMAL HIGH (ref 0.61–1.24)
GFR, Estimated: 16 mL/min — ABNORMAL LOW (ref >60)
GFR, Estimated: 15 mL/min — ABNORMAL LOW (ref >60)
Glucose, Bld: 165 mg/dL — ABNORMAL HIGH (ref 70–99)
Glucose, Bld: 141 mg/dL — ABNORMAL HIGH (ref 70–99)
Potassium: 4.6 mmol/L (ref 3.5–5.1)
Potassium: 4 mmol/L (ref 3.5–5.1)
Sodium: 133 mmol/L — ABNORMAL LOW (ref 135–145)
Sodium: 146 mmol/L — ABNORMAL HIGH (ref 135–145)
Total Bilirubin: 1.3 mg/dL — ABNORMAL HIGH (ref 0.3–1.2)
Total Bilirubin: 1.8 mg/dL — ABNORMAL HIGH (ref 0.3–1.2)
Total Protein: 5.3 g/dL — ABNORMAL LOW (ref 6.5–8.1)
Total Protein: 4.7 g/dL — ABNORMAL LOW (ref 6.5–8.1)

## 2022-06-05 LAB — URINALYSIS, MICROSCOPIC (REFLEX)
RBC / HPF: NONE SEEN RBC/hpf (ref 0–5)
Squamous Epithelial / HPF: NONE SEEN /HPF (ref 0–5)
WBC, UA: >50 WBC/hpf (ref 0–5)

## 2022-06-05 LAB — I-STAT VENOUS BLOOD GAS, ED
Acid-base deficit: 8 mmol/L — ABNORMAL HIGH (ref 0.0–2.0)
Bicarbonate: 21 mmol/L (ref 20.0–28.0)
Calcium, Ion: 1.18 mmol/L (ref 1.15–1.40)
HCT: 31 % — ABNORMAL LOW (ref 39.0–52.0)
Hemoglobin: 10.5 g/dL — ABNORMAL LOW (ref 13.0–17.0)
O2 Saturation: 99 %
Potassium: 5.5 mmol/L — ABNORMAL HIGH (ref 3.5–5.1)
Sodium: 140 mmol/L (ref 135–145)
TCO2: 23 mmol/L (ref 22–32)
pCO2, Ven: 57.7 mmHg (ref 44–60)
pH, Ven: 7.17 — CL (ref 7.25–7.43)
pO2, Ven: 155 mmHg — ABNORMAL HIGH (ref 32–45)

## 2022-06-05 LAB — TYPE AND SCREEN
ABO/RH(D): AB POS
Antibody Screen: NEGATIVE

## 2022-06-05 LAB — POCT I-STAT 7, (LYTES, BLD GAS, ICA,H+H)
Acid-base deficit: 12 mmol/L — ABNORMAL HIGH (ref 0.0–2.0)
Acid-base deficit: 7 mmol/L — ABNORMAL HIGH (ref 0.0–2.0)
Bicarbonate: 16.3 mmol/L — ABNORMAL LOW (ref 20.0–28.0)
Bicarbonate: 17.9 mmol/L — ABNORMAL LOW (ref 20.0–28.0)
Calcium, Ion: 1.14 mmol/L — ABNORMAL LOW (ref 1.15–1.40)
Calcium, Ion: 1.04 mmol/L — ABNORMAL LOW (ref 1.15–1.40)
HCT: 33 % — ABNORMAL LOW (ref 39.0–52.0)
HCT: 34 % — ABNORMAL LOW (ref 39.0–52.0)
Hemoglobin: 11.2 g/dL — ABNORMAL LOW (ref 13.0–17.0)
Hemoglobin: 11.6 g/dL — ABNORMAL LOW (ref 13.0–17.0)
O2 Saturation: 91 %
O2 Saturation: 97 %
Patient temperature: 36.1
Potassium: 4.6 mmol/L (ref 3.5–5.1)
Potassium: 3.9 mmol/L (ref 3.5–5.1)
Sodium: 140 mmol/L (ref 135–145)
Sodium: 141 mmol/L (ref 135–145)
TCO2: 18 mmol/L — ABNORMAL LOW (ref 22–32)
TCO2: 19 mmol/L — ABNORMAL LOW (ref 22–32)
pCO2 arterial: 47.4 mmHg (ref 32–48)
pCO2 arterial: 32.5 mmHg (ref 32–48)
pH, Arterial: 7.143 — CL (ref 7.35–7.45)
pH, Arterial: 7.345 — ABNORMAL LOW (ref 7.35–7.45)
pO2, Arterial: 78 mmHg — ABNORMAL LOW (ref 83–108)
pO2, Arterial: 90 mmHg (ref 83–108)

## 2022-06-05 LAB — LACTIC ACID, PLASMA
Lactic Acid, Venous: 6.3 mmol/L (ref 0.5–1.9)
Lactic Acid, Venous: 3.2 mmol/L (ref 0.5–1.9)
Lactic Acid, Venous: >9 mmol/L (ref 0.5–1.9)
Lactic Acid, Venous: >9 mmol/L (ref 0.5–1.9)

## 2022-06-05 LAB — PROTIME-INR
INR: 2.4 — ABNORMAL HIGH (ref 0.8–1.2)
Prothrombin Time: 26.3 seconds — ABNORMAL HIGH (ref 11.4–15.2)

## 2022-06-05 LAB — URINALYSIS, ROUTINE W REFLEX MICROSCOPIC

## 2022-06-05 LAB — HEMOGLOBIN A1C
Hgb A1c MFr Bld: 5.8 % — ABNORMAL HIGH (ref 4.8–5.6)
Mean Plasma Glucose: 119.76 mg/dL

## 2022-06-05 LAB — GLUCOSE, CAPILLARY
Glucose-Capillary: 156 mg/dL — ABNORMAL HIGH (ref 70–99)
Glucose-Capillary: 161 mg/dL — ABNORMAL HIGH (ref 70–99)
Glucose-Capillary: 197 mg/dL — ABNORMAL HIGH (ref 70–99)
Glucose-Capillary: 202 mg/dL — ABNORMAL HIGH (ref 70–99)
Glucose-Capillary: 99 mg/dL (ref 70–99)

## 2022-06-05 LAB — BRAIN NATRIURETIC PEPTIDE: B Natriuretic Peptide: 1545.5 pg/mL — ABNORMAL HIGH (ref 0.0–100.0)

## 2022-06-05 LAB — I-STAT CHEM 8, ED
BUN: 39 mg/dL — ABNORMAL HIGH (ref 8–23)
Calcium, Ion: 1.07 mmol/L — ABNORMAL LOW (ref 1.15–1.40)
Chloride: 100 mmol/L (ref 98–111)
Creatinine, Ser: 3.6 mg/dL — ABNORMAL HIGH (ref 0.61–1.24)
Glucose, Bld: 161 mg/dL — ABNORMAL HIGH (ref 70–99)
HCT: 37 % — ABNORMAL LOW (ref 39.0–52.0)
Hemoglobin: 12.6 g/dL — ABNORMAL LOW (ref 13.0–17.0)
Potassium: 4.5 mmol/L (ref 3.5–5.1)
Sodium: 134 mmol/L — ABNORMAL LOW (ref 135–145)
TCO2: 18 mmol/L — ABNORMAL LOW (ref 22–32)

## 2022-06-05 LAB — STREP PNEUMONIAE URINARY ANTIGEN: Strep Pneumo Urinary Antigen: NEGATIVE

## 2022-06-05 LAB — COOXEMETRY PANEL
Carboxyhemoglobin: 0.9 % (ref 0.5–1.5)
Methemoglobin: 1.2 % (ref 0.0–1.5)
O2 Saturation: 68.3 %
Total hemoglobin: 11.7 g/dL — ABNORMAL LOW (ref 12.0–16.0)

## 2022-06-05 LAB — APTT: aPTT: 45 seconds — ABNORMAL HIGH (ref 24–36)

## 2022-06-05 LAB — MRSA NEXT GEN BY PCR, NASAL: MRSA by PCR Next Gen: NOT DETECTED

## 2022-06-05 LAB — TROPONIN I (HIGH SENSITIVITY)
Troponin I (High Sensitivity): 253 ng/L (ref <18)
Troponin I (High Sensitivity): 240 ng/L (ref <18)

## 2022-06-05 SURGERY — TEMPORARY PACEMAKER
Anesthesia: LOCAL

## 2022-06-05 MED ORDER — FENTANYL CITRATE PF 50 MCG/ML IJ SOSY: 25.0000 ug | PREFILLED_SYRINGE | INTRAMUSCULAR | Status: DC | PRN

## 2022-06-05 MED ORDER — ETOMIDATE 2 MG/ML IV SOLN
INTRAVENOUS | Status: AC
  Administered 2022-06-05: 20 mg
  Filled 2022-06-05: qty 10

## 2022-06-05 MED ORDER — SODIUM BICARBONATE 8.4 % IV SOLN
INTRAVENOUS | Status: AC | PRN
  Administered 2022-06-05 (×2): 50 meq via INTRAVENOUS

## 2022-06-05 MED ORDER — ETOMIDATE 2 MG/ML IV SOLN
INTRAVENOUS | Status: AC | PRN
  Administered 2022-06-05: 20 mg via INTRAVENOUS

## 2022-06-05 MED ORDER — POLYETHYLENE GLYCOL 3350 17 G PO PACK: 17.0000 g | PACK | Freq: Every day | ORAL | Status: DC | PRN

## 2022-06-05 MED ORDER — SODIUM CHLORIDE 0.9 % IV SOLN
INTRAVENOUS | Status: DC | PRN
  Administered 2022-06-05: 10 mL/h via INTRAVENOUS

## 2022-06-05 MED ORDER — ATROPINE SULFATE 1 MG/10ML IJ SOSY
1.0000 mg | PREFILLED_SYRINGE | INTRAMUSCULAR | Status: AC
  Administered 2022-06-05: 1 mg via INTRAVENOUS
  Filled 2022-06-05: qty 10

## 2022-06-05 MED ORDER — CALCIUM CHLORIDE 10 % IV SOLN
INTRAVENOUS | Status: AC | PRN
  Administered 2022-06-05: 1 g via INTRAVENOUS

## 2022-06-05 MED ORDER — ROSUVASTATIN CALCIUM 5 MG PO TABS: 10.0000 mg | ORAL_TABLET | Freq: Every day | ORAL | Status: DC

## 2022-06-05 MED ORDER — FENOFIBRATE 160 MG PO TABS
160.0000 mg | ORAL_TABLET | Freq: Every day | ORAL | Status: DC
  Administered 2022-06-06: 160 mg
  Filled 2022-06-05: qty 1

## 2022-06-05 MED ORDER — TAMSULOSIN HCL 0.4 MG PO CAPS: 0.4000 mg | ORAL_CAPSULE | Freq: Every day | ORAL | Status: DC

## 2022-06-05 MED ORDER — HEPARIN (PORCINE) 25000 UT/250ML-% IV SOLN
950.0000 [IU]/h | INTRAVENOUS | Status: DC
  Administered 2022-06-05: 1200 [IU]/h via INTRAVENOUS
  Administered 2022-06-06 – 2022-06-07 (×2): 950 [IU]/h via INTRAVENOUS
  Filled 2022-06-05 (×3): qty 250

## 2022-06-05 MED ORDER — FENOFIBRATE 160 MG PO TABS
160.0000 mg | ORAL_TABLET | Freq: Every day | ORAL | Status: DC
  Filled 2022-06-05: qty 1

## 2022-06-05 MED ORDER — EPINEPHRINE 1 MG/10ML IJ SOSY
PREFILLED_SYRINGE | INTRAMUSCULAR | Status: AC | PRN
  Administered 2022-06-05 (×2): 1 mg via INTRAVENOUS

## 2022-06-05 MED ORDER — MIDAZOLAM HCL 2 MG/2ML IJ SOLN
INTRAMUSCULAR | Status: AC
  Filled 2022-06-05: qty 2

## 2022-06-05 MED ORDER — NOREPINEPHRINE 4 MG/250ML-% IV SOLN
0.0000 ug/min | INTRAVENOUS | Status: DC
  Administered 2022-06-05: 2 ug/min via INTRAVENOUS
  Administered 2022-06-05: 7 ug/min via INTRAVENOUS
  Administered 2022-06-06: 5 ug/min via INTRAVENOUS
  Filled 2022-06-05 (×3): qty 250

## 2022-06-05 MED ORDER — DOCUSATE SODIUM 50 MG/5ML PO LIQD
100.0000 mg | Freq: Two times a day (BID) | ORAL | Status: DC
  Administered 2022-06-05 – 2022-06-07 (×5): 100 mg
  Filled 2022-06-05 (×5): qty 10

## 2022-06-05 MED ORDER — EPINEPHRINE 1 MG/10ML IJ SOSY
PREFILLED_SYRINGE | INTRAMUSCULAR | Status: AC | PRN
  Administered 2022-06-05: 1 mg via INTRAVENOUS

## 2022-06-05 MED ORDER — SODIUM CHLORIDE 0.9% FLUSH: 10.0000 mL | INTRAVENOUS | Status: DC | PRN

## 2022-06-05 MED ORDER — AMIODARONE IV BOLUS ONLY 150 MG/100ML
INTRAVENOUS | Status: AC | PRN
  Administered 2022-06-05: 300 mg via INTRAVENOUS

## 2022-06-05 MED ORDER — LACTATED RINGERS IV BOLUS (SEPSIS)
1000.0000 mL | Freq: Once | INTRAVENOUS | Status: AC
  Administered 2022-06-05: 1000 mL via INTRAVENOUS

## 2022-06-05 MED ORDER — SODIUM CHLORIDE 0.9 % IV SOLN
1.0000 g | Freq: Once | INTRAVENOUS | Status: DC
  Filled 2022-06-05: qty 10

## 2022-06-05 MED ORDER — SODIUM CHLORIDE 0.9 % IV SOLN
1.0000 g | INTRAVENOUS | Status: DC
  Administered 2022-06-05: 1 g via INTRAVENOUS
  Filled 2022-06-05: qty 10

## 2022-06-05 MED ORDER — SODIUM CHLORIDE 0.9 % IV SOLN
250.0000 mL | INTRAVENOUS | Status: DC
  Administered 2022-06-06 – 2022-06-07 (×2): 250 mL via INTRAVENOUS

## 2022-06-05 MED ORDER — FINASTERIDE 5 MG PO TABS: 5.0000 mg | ORAL_TABLET | Freq: Every day | ORAL | Status: DC

## 2022-06-05 MED ORDER — MIDAZOLAM HCL 2 MG/2ML IJ SOLN
INTRAMUSCULAR | Status: DC | PRN
  Administered 2022-06-05: 1 mg via INTRAVENOUS

## 2022-06-05 MED ORDER — FENTANYL CITRATE (PF) 100 MCG/2ML IJ SOLN
INTRAMUSCULAR | Status: AC
  Filled 2022-06-05: qty 2

## 2022-06-05 MED ORDER — POLYETHYLENE GLYCOL 3350 17 G PO PACK
17.0000 g | PACK | Freq: Every day | ORAL | Status: DC
  Administered 2022-06-06 – 2022-06-07 (×2): 17 g
  Filled 2022-06-05 (×2): qty 1

## 2022-06-05 MED ORDER — PROBENECID 500 MG PO TABS: 500.0000 mg | ORAL_TABLET | Freq: Every day | ORAL | Status: DC

## 2022-06-05 MED ORDER — ACETAMINOPHEN 325 MG PO TABS
650.0000 mg | ORAL_TABLET | Freq: Four times a day (QID) | ORAL | Status: DC | PRN
  Administered 2022-06-06: 650 mg via ORAL
  Filled 2022-06-05: qty 2

## 2022-06-05 MED ORDER — LACTATED RINGERS IV BOLUS (SEPSIS): 500.0000 mL | Freq: Once | INTRAVENOUS | Status: DC

## 2022-06-05 MED ORDER — PIPERACILLIN-TAZOBACTAM IN DEX 2-0.25 GM/50ML IV SOLN
2.2500 g | Freq: Three times a day (TID) | INTRAVENOUS | Status: DC
  Administered 2022-06-05 – 2022-06-07 (×6): 2.25 g via INTRAVENOUS
  Filled 2022-06-05 (×8): qty 50

## 2022-06-05 MED ORDER — SODIUM CHLORIDE 0.9 % IV BOLUS (SEPSIS)
1000.0000 mL | Freq: Once | INTRAVENOUS | Status: AC
  Administered 2022-06-05: 1000 mL via INTRAVENOUS

## 2022-06-05 MED ORDER — PROPOFOL 1000 MG/100ML IV EMUL
INTRAVENOUS | Status: AC
  Filled 2022-06-05: qty 100

## 2022-06-05 MED ORDER — SODIUM CHLORIDE 0.9 % IV SOLN
500.0000 mg | INTRAVENOUS | Status: DC
  Administered 2022-06-05: 500 mg via INTRAVENOUS
  Filled 2022-06-05: qty 5

## 2022-06-05 MED ORDER — EPINEPHRINE HCL 5 MG/250ML IV SOLN IN NS
0.5000 ug/min | INTRAVENOUS | Status: DC
  Administered 2022-06-05: 18 ug/min via INTRAVENOUS
  Administered 2022-06-05 (×2): 20 ug/min via INTRAVENOUS
  Administered 2022-06-05: 0.5 ug/min via INTRAVENOUS
  Administered 2022-06-06: 15 ug/min via INTRAVENOUS
  Administered 2022-06-06 (×2): 18 ug/min via INTRAVENOUS
  Administered 2022-06-06: 11 ug/min via INTRAVENOUS
  Administered 2022-06-07: 15 ug/min via INTRAVENOUS
  Administered 2022-06-07: 17 ug/min via INTRAVENOUS
  Administered 2022-06-07 (×2): 15 ug/min via INTRAVENOUS
  Administered 2022-06-08: 13 ug/min via INTRAVENOUS
  Administered 2022-06-08: 18 ug/min via INTRAVENOUS
  Filled 2022-06-05 (×8): qty 250
  Filled 2022-06-05: qty 500
  Filled 2022-06-05 (×6): qty 250

## 2022-06-05 MED ORDER — ROCURONIUM BROMIDE 10 MG/ML (PF) SYRINGE
PREFILLED_SYRINGE | INTRAVENOUS | Status: AC
  Administered 2022-06-05: 100 mg
  Filled 2022-06-05: qty 10

## 2022-06-05 MED ORDER — LIDOCAINE HCL (PF) 1 % IJ SOLN
INTRAMUSCULAR | Status: AC
  Filled 2022-06-05: qty 30

## 2022-06-05 MED ORDER — CHLORHEXIDINE GLUCONATE CLOTH 2 % EX PADS
6.0000 | MEDICATED_PAD | Freq: Every day | CUTANEOUS | Status: DC
  Administered 2022-06-05 – 2022-06-07 (×3): 6 via TOPICAL

## 2022-06-05 MED ORDER — PROPOFOL 1000 MG/100ML IV EMUL
0.0000 ug/kg/min | INTRAVENOUS | Status: DC
  Administered 2022-06-05: 10 ug/kg/min via INTRAVENOUS
  Administered 2022-06-05 (×2): 30 ug/kg/min via INTRAVENOUS
  Administered 2022-06-05: 40 ug/kg/min via INTRAVENOUS
  Administered 2022-06-06: 10 ug/kg/min via INTRAVENOUS
  Administered 2022-06-06: 24 ug/kg/min via INTRAVENOUS
  Administered 2022-06-07: 15 ug/kg/min via INTRAVENOUS
  Administered 2022-06-07: 22 ug/kg/min via INTRAVENOUS
  Filled 2022-06-05 (×5): qty 100
  Filled 2022-06-05: qty 200
  Filled 2022-06-05: qty 100

## 2022-06-05 MED ORDER — SODIUM CHLORIDE 0.9 % IV SOLN
2.0000 g | INTRAVENOUS | Status: DC
  Filled 2022-06-05: qty 20

## 2022-06-05 MED ORDER — FENTANYL CITRATE (PF) 100 MCG/2ML IJ SOLN
INTRAMUSCULAR | Status: DC | PRN
  Administered 2022-06-05: 25 ug via INTRAVENOUS

## 2022-06-05 MED ORDER — SODIUM CHLORIDE 0.9% FLUSH
10.0000 mL | Freq: Two times a day (BID) | INTRAVENOUS | Status: DC
  Administered 2022-06-05 – 2022-06-06 (×3): 10 mL
  Administered 2022-06-06: 30 mL
  Administered 2022-06-07 (×2): 10 mL

## 2022-06-05 MED ORDER — ORAL CARE MOUTH RINSE
15.0000 mL | OROMUCOSAL | Status: DC
  Administered 2022-06-05 – 2022-06-08 (×30): 15 mL via OROMUCOSAL

## 2022-06-05 MED ORDER — FLUTICASONE FUROATE-VILANTEROL 100-25 MCG/ACT IN AEPB
1.0000 | INHALATION_SPRAY | Freq: Every day | RESPIRATORY_TRACT | Status: DC
  Filled 2022-06-05: qty 28

## 2022-06-05 MED ORDER — MAGNESIUM SULFATE 50 % IJ SOLN
INTRAMUSCULAR | Status: AC | PRN
  Administered 2022-06-05: 2 g via INTRAVENOUS

## 2022-06-05 MED ORDER — FLUTICASONE PROPIONATE 50 MCG/ACT NA SUSP: 1.0000 | Freq: Every day | NASAL | Status: DC | PRN

## 2022-06-05 MED ORDER — IPRATROPIUM-ALBUTEROL 0.5-2.5 (3) MG/3ML IN SOLN
3.0000 mL | RESPIRATORY_TRACT | Status: AC
  Administered 2022-06-05: 3 mL via RESPIRATORY_TRACT
  Filled 2022-06-05: qty 3

## 2022-06-05 MED ORDER — FENTANYL CITRATE PF 50 MCG/ML IJ SOSY
25.0000 ug | PREFILLED_SYRINGE | INTRAMUSCULAR | Status: DC | PRN
  Administered 2022-06-05 (×3): 100 ug via INTRAVENOUS
  Administered 2022-06-06 (×2): 50 ug via INTRAVENOUS
  Administered 2022-06-06 (×3): 100 ug via INTRAVENOUS
  Administered 2022-06-06 (×2): 50 ug via INTRAVENOUS
  Administered 2022-06-06: 100 ug via INTRAVENOUS
  Administered 2022-06-06 – 2022-06-07 (×3): 50 ug via INTRAVENOUS
  Filled 2022-06-05: qty 1
  Filled 2022-06-05 (×2): qty 2
  Filled 2022-06-05: qty 1
  Filled 2022-06-05: qty 2
  Filled 2022-06-05: qty 1
  Filled 2022-06-05 (×2): qty 2
  Filled 2022-06-05: qty 1
  Filled 2022-06-05: qty 2
  Filled 2022-06-05: qty 1
  Filled 2022-06-05: qty 2

## 2022-06-05 MED ORDER — ATROPINE SULFATE 1 MG/10ML IJ SOSY
PREFILLED_SYRINGE | INTRAMUSCULAR | Status: AC | PRN
  Administered 2022-06-05 (×3): 1 mg via INTRAVENOUS

## 2022-06-05 MED ORDER — DOCUSATE SODIUM 100 MG PO CAPS: 100.0000 mg | ORAL_CAPSULE | Freq: Two times a day (BID) | ORAL | Status: DC | PRN

## 2022-06-05 MED ORDER — STERILE WATER FOR INJECTION IV SOLN
INTRAVENOUS | Status: DC
  Filled 2022-06-05 (×3): qty 1000

## 2022-06-05 MED ORDER — ORAL CARE MOUTH RINSE: 15.0000 mL | OROMUCOSAL | Status: DC | PRN

## 2022-06-05 MED ORDER — INSULIN ASPART 100 UNIT/ML IJ SOLN
1.0000 [IU] | INTRAMUSCULAR | Status: DC
  Administered 2022-06-05 (×2): 2 [IU] via SUBCUTANEOUS
  Administered 2022-06-05: 3 [IU] via SUBCUTANEOUS
  Administered 2022-06-05: 2 [IU] via SUBCUTANEOUS
  Administered 2022-06-06 – 2022-06-08 (×7): 1 [IU] via SUBCUTANEOUS

## 2022-06-05 MED ORDER — SODIUM BICARBONATE 8.4 % IV SOLN
INTRAVENOUS | Status: AC | PRN
  Administered 2022-06-05: 50 meq via INTRAVENOUS
  Administered 2022-06-05: 100 meq via INTRAVENOUS
  Administered 2022-06-05 (×3): 50 meq via INTRAVENOUS

## 2022-06-05 MED ORDER — LIDOCAINE HCL (PF) 1 % IJ SOLN
INTRAMUSCULAR | Status: DC | PRN
  Administered 2022-06-05: 5 mL
  Administered 2022-06-05: 2 mL

## 2022-06-05 MED ORDER — SODIUM BICARBONATE 8.4 % IV SOLN
INTRAVENOUS | Status: DC | PRN
  Administered 2022-06-05: 12240 meq via INTRAVENOUS

## 2022-06-05 SURGICAL SUPPLY — 9 items
GLIDESHEATH SLEND SS 6F .021 (SHEATH) IMPLANT
PACK CARDIAC CATHETERIZATION (CUSTOM PROCEDURE TRAY) IMPLANT
PROTECTION STATION PRESSURIZED (MISCELLANEOUS) ×1
SHEATH PINNACLE 6F 10CM (SHEATH) IMPLANT
SHEATH PROBE COVER 6X72 (BAG) IMPLANT
SLEEVE REPOSITIONING LENGTH 30 (MISCELLANEOUS) IMPLANT
STATION PROTECTION PRESSURIZED (MISCELLANEOUS) IMPLANT
WIRE MICRO SET 5FR 12 (WIRE) IMPLANT
WIRE PACING TEMP ST TIP 5 (CATHETERS) IMPLANT

## 2022-06-05 NOTE — ED Notes (Signed)
Carlis Abbott, MD put in order for bipap and additional medications to help heart rate and work of breathing.

## 2022-06-05 NOTE — Progress Notes (Addendum)
ANTICOAGULATION CONSULT NOTE - Initial Consult  Pharmacy Consult for Heparin (Apixaban on hold) Indication:  History of PE  Allergies  Allergen Reactions   Cefaclor Other (See Comments)     skin split   Penicillins Rash    Has patient had a PCN reaction causing immediate rash, facial/tongue/throat swelling, SOB or lightheadedness with hypotension:unsure Has patient had a PCN reaction causing severe rash involving mucus membranes or skin necrosis:unsure Has patient had a PCN reaction that required hospitalization:No Has patient had a PCN reaction occurring within the last 10 years:No--childhood allergy If all of the above answers are "NO", then may proceed with Cephalosporin use.     Patient Measurements: Height: '5\' 10"'$  (177.8 cm) Weight: 81.6 kg (180 lb) IBW/kg (Calculated) : 73  Vital Signs: Temp Source: Oral (01/17 0347) BP: 92/70 (01/17 0640) Pulse Rate: 75 (01/17 0640)  Labs: Recent Labs    06/10/2022 0349 06/12/2022 0407  HGB 11.4* 12.6*  HCT 35.8* 37.0*  PLT 119*  --   APTT 45*  --   LABPROT 26.3*  --   INR 2.4*  --   CREATININE 3.67* 3.60*  TROPONINIHS 253*  --     Estimated Creatinine Clearance: 17.2 mL/min (A) (by C-G formula based on SCr of 3.6 mg/dL (H)).   Medical History: Past Medical History:  Diagnosis Date   Anxiety    reports scotch helps that    Aortic stenosis, mild 08/18/2018   Mean gradient 14 mmHg 04/2018   Arthritis    " it's everywhere"   Blood transfusion without reported diagnosis    Cataract    having cataract surgery July 2016   Chronic bronchitis ([place])    Chronic kidney disease    hx kidney stones   Colon polyps    Complication of anesthesia    after surgery 07-2013 unable to sleep for 3 days   COPD (chronic obstructive pulmonary disease) ([place])    Coronary artery calcification seen on CAT scan 08/18/2018   GERD (gastroesophageal reflux disease)    History of kidney stones    Hyperlipidemia    Hypertension    Prostate  hypertrophy    Pulmonary embolism ([place]) 12/2014   post TJR   Seasonal allergies    Skin cancer    squamous cell, 1 melanoma left shoulder, one episode- under tongue, has had a total of 17-18 episodes    Sleep apnea    had surgery in 2000 for sleep apnea, no CPAP needed   Thoracic aortic aneurysm ([place]) 05/04/2019   4.6 cm 04/2019     Assessment: 82 y/o M in the ED with bradycardia. On apixaban PTA for history of PE. Holding apixaban and starting heparin for now. Last dose of apixaban was 1/16 at 1400, ok to start heparin for now. Anticipate using aPTT to dose.   Goal of Therapy:  Heparin level 0.3-0.7 units/ml aPTT 66-102 secs Monitor platelets by anticoagulation protocol: Yes   Plan:  Start heparin drip at 1200 units/hr 1500 Heparin level and aPTT Daily CBC/Heparin level/aPTT Monitor for bleeding  Narda Bonds, PharmD, BCPS Clinical Pharmacist Phone: [phone]

## 2022-06-05 NOTE — Progress Notes (Signed)
ANTICOAGULATION CONSULT NOTE - Initial Consult  Pharmacy Consult for Heparin (Apixaban on hold) Indication:  History of PE  Allergies  Allergen Reactions   Cefaclor Other (See Comments)     skin split   Penicillins Rash    Has patient had a PCN reaction causing immediate rash, facial/tongue/throat swelling, SOB or lightheadedness with hypotension:unsure Has patient had a PCN reaction causing severe rash involving mucus membranes or skin necrosis:unsure Has patient had a PCN reaction that required hospitalization:No Has patient had a PCN reaction occurring within the last 10 years:No--childhood allergy If all of the above answers are "NO", then may proceed with Cephalosporin use.     Patient Measurements: Height: '5\' 10"'$  (177.8 cm) Weight: 81.6 kg (180 lb) IBW/kg (Calculated) : 73  Vital Signs: Temp: 98.4 F (36.9 C) (01/17 1700) Temp Source: Bladder (01/17 1600) BP: 128/51 (01/17 1700) Pulse Rate: 80 (01/17 1700)  Labs: Recent Labs    05/31/2022 0349 06/14/2022 0407 06/18/2022 0629 06/07/2022 0752 05/28/2022 0852 05/22/2022 1105 05/29/2022 1129  HGB 11.4* 12.6*  --  10.5* 11.2*  --  11.6*  HCT 35.8* 37.0*  --  31.0* 33.0*  --  34.0*  PLT 119*  --   --   --   --   --   --   APTT 45*  --   --   --   --   --   --   LABPROT 26.3*  --   --   --   --   --   --   INR 2.4*  --   --   --   --   --   --   CREATININE 3.67* 3.60*  --   --   --  3.83*  --   TROPONINIHS 253*  --  240*  --   --   --   --      Estimated Creatinine Clearance: 16.1 mL/min (A) (by C-G formula based on SCr of 3.83 mg/dL (H)).   Medical History: Past Medical History:  Diagnosis Date   Anxiety    reports scotch helps that    Aortic stenosis, mild 08/18/2018   Mean gradient 14 mmHg 04/2018   Arthritis    " it's everywhere"   Blood transfusion without reported diagnosis    Cataract    having cataract surgery July 2016   Chronic bronchitis ([place])    Chronic kidney disease    hx kidney stones   Colon  polyps    Complication of anesthesia    after surgery 07-2013 unable to sleep for 3 days   COPD (chronic obstructive pulmonary disease) ([place])    Coronary artery calcification seen on CAT scan 08/18/2018   GERD (gastroesophageal reflux disease)    History of kidney stones    Hyperlipidemia    Hypertension    Prostate hypertrophy    Pulmonary embolism ([place]) 12/2014   post TJR   Seasonal allergies    Skin cancer    squamous cell, 1 melanoma left shoulder, one episode- under tongue, has had a total of 17-18 episodes    Sleep apnea    had surgery in 2000 for sleep apnea, no CPAP needed   Thoracic aortic aneurysm ([place]) 05/04/2019   4.6 cm 04/2019     Assessment: 82 y/o M in the ED with bradycardia. On apixaban PTA for history of PE. Holding apixaban and starting heparin for now. Last dose of apixaban was 1/16 at 1400, ok to start heparin for now. Anticipate  using aPTT to dose.   Update (1730): Heparin not started yet due to pt with hemoptysis and then bronch done 1/17 pm. Okay per Dr. Tacy Learn to start heparin now.  Goal of Therapy:  Heparin level 0.3-0.7 units/ml aPTT 66-102 secs Monitor platelets by anticoagulation protocol: Yes   Plan:  Start heparin drip at 1200 units/hr 8 h heparin level and aPTT Daily CBC/Heparin level/aPTT Monitor for bleeding  Sherlon Handing, PharmD, BCPS Please see amion for complete clinical pharmacist phone list 06/11/2022 5:27 PM

## 2022-06-05 NOTE — Progress Notes (Signed)
An USGPIV (ultrasound guided PIV) has been placed for short-term vasopressor infusion. A correctly placed ivWatch must be used when administering Vasopressors. Should this treatment be needed beyond 72 hours, central line access should be obtained.  It will be the responsibility of the bedside nurse to follow best practice to prevent extravasations.   ?

## 2022-06-05 NOTE — Progress Notes (Signed)
Pharmacy Antibiotic Note  [name] is a 82 y.o. male admitted on 05/26/2022 with sepsis/UTI.  Pharmacy has been consulted for Zosyn dosing.  Plan: Zosyn 2.25 g IV q 8 hrs for now. F/u cultures, renal function and clinical course.   Height: '5\' 10"'$  (177.8 cm) Weight: 81.6 kg (180 lb) IBW/kg (Calculated) : 73  Temp (24hrs), Avg:97.3 F (36.3 C), Min:95.9 F (35.5 C), Max:97.9 F (36.6 C)  Recent Labs  Lab 05/20/2022 0349 06/03/2022 0407 05/21/2022 0629 06/14/2022 1105  WBC 17.9*  --   --   --   CREATININE 3.67* 3.60*  --  3.83*  LATICACIDVEN 6.3*  --  3.2* >9.0*    Estimated Creatinine Clearance: 16.1 mL/min (A) (by C-G formula based on SCr of 3.83 mg/dL (H)).    Allergies  Allergen Reactions   Cefaclor Other (See Comments)     skin split   Penicillins Rash    Has patient had a PCN reaction causing immediate rash, facial/tongue/throat swelling, SOB or lightheadedness with hypotension:unsure Has patient had a PCN reaction causing severe rash involving mucus membranes or skin necrosis:unsure Has patient had a PCN reaction that required hospitalization:No Has patient had a PCN reaction occurring within the last 10 years:No--childhood allergy If all of the above answers are "NO", then may proceed with Cephalosporin use.     Antimicrobials this admission:  Ceftriaxone 1/17 > 1/17 Zosyn 1/17>  Dose adjustments this admission:   Microbiology results:  1/17 BCx x 2 >  1/17 UCx >  1/17 Resp viral panel > neg  Thank you for allowing pharmacy to be a part of this patient's care.  [place] Crane, Roylene Reason, BCCP Clinical Pharmacist  05/20/2022 2:37 PM   [facility] pharmacy phone numbers are listed on [email or web address]

## 2022-06-05 NOTE — ED Provider Notes (Signed)
Patient is seen by critical care.  They are going to discuss with cardiology about the need for urgent pacemaker placement.  Overall his blood pressure is improving, but still bradycardic in the 30s His work of breathing is starting to increase, will start him on BiPAP.   Ripley Fraise, MD 05/23/2022 [phone]

## 2022-06-05 NOTE — Progress Notes (Signed)
IO removed per protocol. No bleeding post removal. Vaseline and cotton gauzed placed no site with transparent dressing. Nurse notifed

## 2022-06-05 NOTE — Procedures (Signed)
Intraosseous Needle Insertion Procedure Note    Date:05/25/2022  Time:8:19 AM   Provider Performing:Adrien Shankar   Procedure: Insertion Intraosseous (69861)  Indication(s) Medication administration  Consent Unable to obtain consent due to emergent nature of procedure.  Anesthesia Topical only with 1% lidocaine   Timeout Verified patient identification, verified procedure, site/side was marked, verified correct patient position, special equipment/implants available, medications/allergies/relevant history reviewed, required imaging and test results available.  Procedure Description Area of needle insertion was cleaned with chlorhexidine. Intraosseous needle was placed into the left tibia. Bone marrow was aspirated and site easily flushed. The needle was secured in place and dressing applied.  Complications/Tolerance None; patient tolerated the procedure well.  EBL Minimal

## 2022-06-05 NOTE — Consult Note (Signed)
Cardiology Consultation   Patient ID: [name] MRN: [id number]; DOB: [birth year]  Admit date: 06/09/2022 Date of Consult: 05/21/2022  PCP:  Shon Baton, MD   [facility] Prov[facility] Cardiologist:  Skeet Latch, MD        Patient Profile:   [name] is a 82 y.o. male with a significant past medical history of severe aortic valve stenosis,  carotid stenosis status post CEA, hypertension, thoracic aorta aneurysm, COPD, pulmonary pulmonary embolism, hypertension, paroxysmal atrial fibrillation, and periodic cigar use who is being seen 06/11/2022 for the evaluation of feeling fatigued with ECG concerning for complete heart block.  History of Present Illness:   [name] states at around 6 pm yesterday he noticed that he felt short of breath, tired, dizzy, and chills.  He notes feeling like he was going to faint after getting to stand from giving a self catheterization.  Due to ongoing symptoms, he called emergency medical services to his home.  Per emergency medicine documentation, patient was noted to be bradycardic and was given atropine in the field without resolution of bradycardia.  He was eventually placed on IV epinephrine and sent to our emergency department.  In the emergency department patient was noted to be in complete heart block with a  heart rate between 33-40 bpm.  He also was hypotensive and was started on IV fluids.  Chest x-ray revealed evolving right middle lobe pneumonia.  Patient was placed on transcutaneous pacing in the emergency room and started on IV antibiotics.       Past Medical History:  Diagnosis Date   Anxiety    reports scotch helps that    Aortic stenosis, mild 08/18/2018   Mean gradient 14 mmHg 04/2018   Arthritis    " it's everywhere"   Blood transfusion without reported diagnosis    Cataract    having cataract surgery July 2016   Chronic bronchitis ([place])    Chronic kidney disease    hx kidney stones   Colon polyps     Complication of anesthesia    after surgery 07-2013 unable to sleep for 3 days   COPD (chronic obstructive pulmonary disease) ([place])    Coronary artery calcification seen on CAT scan 08/18/2018   GERD (gastroesophageal reflux disease)    History of kidney stones    Hyperlipidemia    Hypertension    Prostate hypertrophy    Pulmonary embolism ([place]) 12/2014   post TJR   Seasonal allergies    Skin cancer    squamous cell, 1 melanoma left shoulder, one episode- under tongue, has had a total of 17-18 episodes    Sleep apnea    had surgery in 2000 for sleep apnea, no CPAP needed   Thoracic aortic aneurysm ([place]) 05/04/2019   4.6 cm 04/2019    Past Surgical History:  Procedure Laterality Date   ANTERIOR CERVICAL DECOMP/DISCECTOMY FUSION N/A 11/30/2020   Procedure: Cervical Four-Five, Cervical Five-Six, Cervical Six-Seven Anterior cervical decompression/discectomy/fusion;  Surgeon: Kristeen Miss, MD;  Location: [facility];  Service: Neurosurgery;  Laterality: N/A;   CAROTID ENDARTERECTOMY Right 10/01/2013   CE   COLONOSCOPY     ENDARTERECTOMY Right 10/01/2013   Procedure: ENDARTERECTOMY CAROTID-RIGHT;  Surgeon: Rosetta Posner, MD;  Location: [facility] OR;  Service: Vascular;  Laterality: Right;   ESOPHAGOGASTRODUODENOSCOPY ENDOSCOPY  10/12/2013   EXCISION ORAL LESION WITH CO2 LASER N/A 09/08/2015   Procedure: EXCISION ORAL LESION WITH CO2 LASER;  Surgeon: Jerrell Belfast, MD;  Location: [facility]  OR;  Service: ENT;  Laterality: N/A;   EYE SURGERY Bilateral    cataracts, W/IOL   JOINT REPLACEMENT Right 09/02/2014   Knee   JOINT REPLACEMENT Left 2012   Knee   PATCH ANGIOPLASTY Right 10/01/2013   Procedure: PATCH ANGIOPLASTY USING HEMASHIELD FINESSE PATCH;  Surgeon: Rosetta Posner, MD;  Location: [place];  Service: Vascular;  Laterality: Right;   removal of melonomia Left    shoulder   REPLACEMENT TOTAL KNEE Left    REVERSE SHOULDER ARTHROPLASTY Right 10/11/2016   Procedure: REVERSE RIGHT SHOULDER ARTHROPLASTY;   Surgeon: Netta Cedars, MD;  Location: [facility];  Service: Orthopedics;  Laterality: Right;   REVERSE SHOULDER ARTHROPLASTY Left 12/03/2019   Procedure: REVERSE SHOULDER ARTHROPLASTY;  Surgeon: Netta Cedars, MD;  Location: [facility];  Service: Orthopedics;  Laterality: Left;  interscalene block   RHINOPLASTY     1999   RHINOPLASTY     RIGHT/LEFT HEART CATH AND CORONARY ANGIOGRAPHY N/A 03/29/2022   Procedure: RIGHT/LEFT HEART CATH AND CORONARY ANGIOGRAPHY;  Surgeon: Early Osmond, MD;  Location: [facility];  Service: Cardiovascular;  Laterality: N/A;   SKIN CANCER EXCISION     squamous cell cancer under tongue   SKIN SURGERY     squamous cell carcinoma removed in various places,melanoma removed from L shoulder   sleep apnea surgery  2000   SPINAL FUSION N/A 11/30/2020   STERIOD INJECTION Left 10/11/2016   Procedure: STEROID INJECTION LEFT SHOULDER;  Surgeon: Netta Cedars, MD;  Location: [facility];  Service: Orthopedics;  Laterality: Left;   TOTAL HIP ARTHROPLASTY Left 10/01/2017   Procedure: LEFT TOTAL HIP ARTHROPLASTY ANTERIOR APPROACH;  Surgeon: Gaynelle Arabian, MD;  Location: [facility];  Service: Orthopedics;  Laterality: Left;   TOTAL KNEE ARTHROPLASTY Right 09/02/2014   Procedure: RIGHT TOTAL KNEE ARTHROPLASTY;  Surgeon: Netta Cedars, MD;  Location: [place];  Service: Orthopedics;  Laterality: Right;       Inpatient Medications: Scheduled Meds:  sodium chloride flush  3 mL Intravenous Q12H   Continuous Infusions:  sodium chloride     azithromycin     cefTRIAXone (ROCEPHIN)  IV Stopped (06/17/2022 0521)   lactated ringers     And   lactated ringers     norepinephrine (LEVOPHED) Adult infusion     PRN Meds:   Allergies:    Allergies  Allergen Reactions   Cefaclor Other (See Comments)     skin split   Penicillins Rash    Has patient had a PCN reaction causing immediate rash, facial/tongue/throat swelling, SOB or lightheadedness with hypotension:unsure Has patient had a PCN  reaction causing severe rash involving mucus membranes or skin necrosis:unsure Has patient had a PCN reaction that required hospitalization:No Has patient had a PCN reaction occurring within the last 10 years:No--childhood allergy If all of the above answers are "NO", then may proceed with Cephalosporin use.     Social History:   Social History   Socioeconomic History   Marital status: Divorced    Spouse name: Not on file   Number of children: 2   Years of education: Not on file   Highest education level: Not on file  Occupational History   Occupation: real Government social research officer: [employer]  Tobacco Use   Smoking status: Light Smoker    Types: Cigars   Smokeless tobacco: Never   Tobacco comments:    pt states he smokes a couple of cigars per day. Never smoke cigarettes.  1 in  the afternoon with an adult beverage and one after a meal.  Vaping Use   Vaping Use: Never used  Substance and Sexual Activity   Alcohol use: Yes    Alcohol/week: 21.0 standard drinks of alcohol    Types: 7 Glasses of wine, 14 Shots of liquor per week    Comment: weekly   Drug use: No   Sexual activity: Not on file  Other Topics Concern   Not on file  Social History Narrative   Not on file   Social Determinants of Health   Financial Resource Strain: Not on file  Food Insecurity: Not on file  Transportation Needs: Not on file  Physical Activity: Not on file  Stress: Not on file  Social Connections: Not on file  Intimate Partner Violence: Not on file    Family History:    Family History  Problem Relation Age of Onset   Diverticulosis Mother    Skin cancer Mother    Varicose Veins Mother    Heart attack Mother    Hyperlipidemia Father    Heart disease Father    Heart attack Father    Colon cancer Neg Hx    Esophageal cancer Neg Hx    Rectal cancer Neg Hx    Stomach cancer Neg Hx      ROS:  Please see the history of present illness.  All other ROS reviewed and  negative.     Physical Exam/Data:   Vitals:   06/03/2022 0455 06/11/2022 0455 06/19/2022 0500 06/17/2022 0515  BP: (!) 77/42 (!) 77/42 (!) 84/42 (!) 79/51  Pulse: (!) 48 (!) 48 (!) 35 (!) 32  Resp: 17 17 (!) 24 (!) 32  TempSrc:      SpO2: 100% 100% 100% 100%  Weight:      Height:        Intake/Output Summary (Last 24 hours) at 06/09/2022 0535 Last data filed at 05/29/2022 0521 Gross per 24 hour  Intake 100 ml  Output --  Net 100 ml      05/29/2022    3:45 AM 04/15/2022    8:53 AM 04/02/2022    8:14 AM  Last 3 Weights  Weight (lbs) 180 lb 181 lb 181 lb  Weight (kg) 81.647 kg 82.101 kg 82.101 kg     Body mass index is 25.83 kg/m.  General:  Well nourished, well developed, mild tachypnea HEENT: normal Neck: JVD >10cm above the right atrium Vascular: No carotid bruits; Distal pulses 2+ bilaterally Cardiac:  normal S1, S2; bradycardic; 2/6 crescendo murmur heard best RUSB Lungs:  clear to auscultation bilaterally, no wheezing, mild crackles  Abd: soft, nontender, no hepatomegaly  Ext: 1+ bilateral lower extremity edema Musculoskeletal:  No deformities, BUE and BLE strength normal and equal Skin: warm and dry  Neuro:  CNs 2-12 intact, no focal abnormalities noted Psych:  Normal affect   EKG:  The ECG that was done 06/17/2022 (04:02 hrs) was personally reviewed and demonstrates: 3rd degree AV block; RBBB Telemetry:  Telemetry was personally reviewed and demonstrates:  complete heart block  Relevant CV Studies: --Coronary angiography 04/15/22 Dist RCA lesion is 50% stenosed. 1.  Mild to moderate diffuse coronary artery disease with the focal 50 to 60% lesion of the mid right coronary. 2.  Fick cardiac output of 12.8 L/min and Fick cardiac index of 6.4 L/min/m with mean RA pressure of 7 mmHg, RV pressure of 31/8 with a end-diastolic pressure of 9 mmHg, mean wedge pressure of 16 mmHg, and PA  pressure of 31 over 19 mmHg.   --Echocardiogram 03/18/22 IMPRESSIONS   1. Left ventricular  ejection fraction, by estimation, is 60 to 65%. The  left ventricle has normal function. The left ventricle has no regional  wall motion abnormalities. There is moderate left ventricular hypertrophy.  Left ventricular diastolic  parameters are indeterminate.   2. Right ventricular systolic function is normal. The right ventricular  size is normal. Tricuspid regurgitation signal is inadequate for assessing  PA pressure.   3. Left atrial size was mildly dilated.   4. The mitral valve is degenerative. Mild to moderate mitral valve  regurgitation. Severe mitral annular calcification.   5. Aortic dilatation noted. 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   6. The aortic valve is calcified. There is severe calcifcation of the  aortic valve. Aortic valve regurgitation is mild. Severe aortic valve  stenosis. Vmax 3.8 m/s, MG 37 mmHg, AVA 0.8 cm^2, DI 0.24  Laboratory Data:  High Sensitivity Troponin:   Recent Labs  Lab 06/17/2022 0349  TROPONINIHS 253*     Chemistry Recent Labs  Lab 06/19/2022 0349 06/16/2022 0407  NA 133* 134*  K 4.6 4.5  CL 99 100  CO2 19*  --   GLUCOSE 165* 161*  BUN 45* 39*  CREATININE 3.67* 3.60*  CALCIUM 8.1*  --   GFRNONAA 16*  --   ANIONGAP 15  --     Recent Labs  Lab 06/01/2022 0349  PROT 5.3*  ALBUMIN 2.4*  AST 170*  ALT 80*  ALKPHOS 28*  BILITOT 1.3*   Lipids No results for input(s): "CHOL", "TRIG", "HDL", "LABVLDL", "LDLCALC", "CHOLHDL" in the last 168 hours.  Hematology Recent Labs  Lab 06/01/2022 0349 05/22/2022 0407  WBC 17.9*  --   RBC 3.66*  --   HGB 11.4* 12.6*  HCT 35.8* 37.0*  MCV 97.8  --   MCH 31.1  --   MCHC 31.8  --   RDW 14.7  --   PLT 119*  --    Thyroid No results for input(s): "TSH", "FREET4" in the last 168 hours.  BNP Recent Labs  Lab 06/04/2022 0238  BNP 1,545.5*    DDimer No results for input(s): "DDIMER" in the last 168 hours.   Radiology/Studies:  DG Chest Port 1 View  Result Date:  06/12/2022 CLINICAL DATA:  Shortness of breath. EXAM: PORTABLE CHEST 1 VIEW COMPARISON:  03/10/2007. FINDINGS: The heart size and mediastinal contours are within normal limits. There is atherosclerotic calcification of the aorta. Patchy airspace disease is noted in the mid right lung. There is subsegmental atelectasis at the left lung base. No effusion or pneumothorax. 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 Cervical spinal fusion hardware is present. No acute osseous abnormality. IMPRESSION: Patchy airspace disease in the mid right lung, concerning for developing infiltrates. Electronically Signed   By: Brett Fairy M.D.   On: 06/15/2022 04:13     Assessment and Plan:   Complete heart block: Patient with ECG and telemetry that confirms complete heart block.  Patient takes metoprolol, but likely age-related degenerative conduction disease (unlikely reversible).  Patient hemodynamically unstable with noted hypotension, but hypotension likely multifactorial (sepsis and bradycardia).  Currently asymptomatic.  Was initially transcutaneously paced. --Given aortic stenosis and complete heart block, reasonable to initiate isoproterenol in the acute phase with transition to transcutaneous pacing if needed prior to permanent pacemaker.  Acute sepsis complicates permanent pacemaker placement in the acute setting.  Hypotension/shock: Patient with hypotension/shock likely multifactorial (complete heart  block and septic shock).  High concern for septic shock given elevate white blood count and chest x-ray concerning for evolving pneumonia.  Blood pressure remains low.  Currently receiving IV fluids for sepsis concern, but possible acute heart failure syndrome given JVD and crackles noted on examination. --Will order echocardiogram. --Reasonable to start a vasopressor to help with hypotension.  May want to consider judicious use of IV fluids given high concern for possible acute heart failure  syndrome. --Hold Eliquis given need for possible transcutaneous pacer in the acute setting.   --Antibiotics per primary team.    Aortic valve stenosis: Severe aortic valve stenosis by recent echocardiogram.  Currently short of breath, but likely due to pneumonia.  Currently being worked up outpatient for valve replacement.  4.   Paroxysmal atrial fibrillation: Known paroxysmal atrial fibrillation.  Currently in complete heart block.  He has a CHA2DS2 VASc score of 4, moderate risk for thrombo-embolism.  Patient takes apixaban.  Taking for metoprolol prior. --Hold metoprolol and apixaban.  Risk Assessment/Risk Scores:      For questions or updates, please contact [place] Please consult [email or web address] for contact info under    Signed, Jon Billings, MD  06/07/2022 5:35 AM

## 2022-06-05 NOTE — Progress Notes (Signed)
NAME:  [name], MRN:  [id number], DOB:  1941/02/19, LOS: 0 ADMISSION DATE:  05/21/2022 CONSULTATION DATE:  05/26/2022 REFERRING MD:  Christy Gentles - EDP CHIEF COMPLAINT:  CHB, hypotension, ?sepsis   History of Present Illness:  [name] 82 year old gentleman with a history of provoked PE, mild COPD, OSA, severe AS who presented with acute SOB and weakness that began last evening. He has a mild chronic cough that is unchanged. With EMS he was noted to have a heart rate in the 30s and hypotension, as low as 42/20. He was stared on epi and atropine in the field. He was TC paced in the ED with an appropriate response. Cardiology evaluated the patient at bedside and stopped TC pacing and felt that he was a poor candidate for TV pacing. CXR concerning for possible R pneumonia, and he was given fluids and antibiotics for sepsis after blood cultures were collected. He denies change in baseline cough. He has been compliant with his inhalers. He has increase SOB that began suddenly last night. He denies CP. Recently he has had a decreased appetite since treatment for a UTI. He complaints today of feeling thirsty.   Pertinent Medical History:   Past Medical History:  Diagnosis Date   Anxiety    reports scotch helps that    Aortic stenosis, mild 08/18/2018   Mean gradient 14 mmHg 04/2018   Arthritis    " it's everywhere"   Blood transfusion without reported diagnosis    Cataract    having cataract surgery July 2016   Chronic bronchitis ([place])    Chronic kidney disease    hx kidney stones   Colon polyps    Complication of anesthesia    after surgery 07-2013 unable to sleep for 3 days   COPD (chronic obstructive pulmonary disease) ([place])    Coronary artery calcification seen on CAT scan 08/18/2018   GERD (gastroesophageal reflux disease)    History of kidney stones    Hyperlipidemia    Hypertension    Prostate hypertrophy    Pulmonary embolism ([place]) 12/2014   post TJR   Seasonal allergies     Skin cancer    squamous cell, 1 melanoma left shoulder, one episode- under tongue, has had a total of 17-18 episodes    Sleep apnea    had surgery in 2000 for sleep apnea, no CPAP needed   Thoracic aortic aneurysm ([place]) 05/04/2019   4.6 cm 04/2019   Significant Hospital Events: Including procedures, antibiotic start and stop dates in addition to other pertinent events   1/17 admitted, started on epi infusion  Interim History / Subjective:  In the emergency department patient went into PEA cardiac arrest, ROSC was achieved after 35 to 40 minutes of CPR, he was intubated placed on mechanical ventilation and ROSC was achieved X-ray chest post CPR noted to have right-sided pneumothorax  Objective:  Blood pressure (!) 101/42, pulse 80, resp. rate (!) 32, height '5\' 10"'$  (1.778 m), weight 81.6 kg, SpO2 100 %.    Vent Mode: PRVC FiO2 (%):  [100 %] 100 % Set Rate:  [22 bmp-35 bmp] 32 bmp Vt Set:  [500 mL-580 mL] 580 mL PEEP:  [5 cmH20-10 cmH20] 10 cmH20 Plateau Pressure:  [20 cmH20-22 cmH20] 22 cmH20   Intake/Output Summary (Last 24 hours) at 06/13/2022 1127 Last data filed at 06/14/2022 1100 Gross per 24 hour  Intake 3467 ml  Output --  Net 3467 ml    Filed Weights   05/28/2022 0345  Weight: 81.6 kg    Physical Examination:   Physical exam: General: Crtitically ill-appearing male, orally intubated HEENT: [place]/AT, eyes anicteric.  ETT and OGT in place Neuro: Eyes closed, does not open, not following commands, pupils 5 mm bilateral reactive to light, positive cough Chest: Bilateral basal crackles Heart: Regular rate and rhythm, strong systolic murmur best heard in aortic area Abdomen: Soft, nondistended, bowel sounds present Skin: No rash    Covid, flu, RSV negative. Na+  134 Bicarb 19 BUN 39 Cr 3.6 AST 170 ALT 80 WBC 17.9 H/H 11.4/35.8 Platelets 119 LA 3.2 INR 2.4 BNP 1545.5 Trop 240  CXR personally reviewed: Showing right-sided pneumothorax with bilateral  infiltrates with  Resolved Hospital Problem List:    Assessment & Plan:  S/p PEA cardiac arrest Patient underwent cardiac arrest in the emergency department, ROSC was achieved after 35-40 minutes He was intubated During CPR he was noted to have ventricular tachycardia, defibrillated twice He was given amiodarone Continue normothermia protocol  Mixed septic and cardiogenic shock Postarrest patient is requiring Levophed and epinephrine infusions He does have signs of UTI and bilateral pulmonary infiltrates Will get Coox  He is on antibiotics Continue epi and nor epi Advanced heart failure team is following  Right-sided pneumothorax post CPR X-ray chest is suggestive of right-sided pneumothorax Will place a 39 French chest tube and connected to suction  Complete heart block Demand cardiac ischemia Patient presented with complete heart block Could be related to sepsis Currently on epinephrine infusion TVP was placed by interventional cardiology Echocardiogram is requested Trend troponins  Lactic acidosis Acute metabolic acidosis Patient presented with lactate of 6.3, it improved to 3.3 but then he arrested Repeat lactate is pending He has severe metabolic acidosis, required multiple pushes of bicarbonate Will start him on bicarbonate infusion Trend lactate  Acute hypoxic/hypercapnic respiratory failure Aspiration pneumonia Continue lung protective ventilation Hypercapnia has cleared after adjusting ventilator setting Continue IV Zosyn Follow-up respiratory culture  Acute UTI Foley catheter was placed, patient's urine looked like pus UA and urine culture was sent He is on Zosyn Follow-up culture and sensitive results and adjust antibiotic accordingly  AKI on CKD 3b Patient's baseline serum creatinine is around 1.5, now it is at 3.6 Avoid nephrotoxic agents Monitor intake and output  Thrombocytopenia of critical illness Anemia of chronic disease Monitor H&H and  platelet, transfuse if hemoglobin less than 7  Hyponatremia Closely monitor electrolytes  Shock liver Monitor LFTs Avoid hepatotoxic agents  Acute on chronic urinary retention; previous indwelling foley Patient takes finasteride and tamsulosin at home Continue Foley catheter Started on bethanechol  HLD Hold statin and setting of shock liver  Best Practice: (right click and "Reselect all SmartList Selections" daily)   Diet/type: NPO DVT prophylaxis: SCD GI prophylaxis: N/A Lines: N/A Foley:  N/A Code Status:  full code Last date of multidisciplinary goals of care discussion [1/17: Patient's significant other and daughter were updated in conference room today, please see Ipal note]  Labs:  CBC: Recent Labs  Lab 06/17/2022 0349 06/04/2022 0407 05/20/2022 0752 05/26/2022 0852  WBC 17.9*  --   --   --   HGB 11.4* 12.6* 10.5* 11.2*  HCT 35.8* 37.0* 31.0* 33.0*  MCV 97.8  --   --   --   PLT 119*  --   --   --     Basic Metabolic Panel: Recent Labs  Lab 05/22/2022 0349 05/29/2022 0407 06/04/2022 0752 06/06/2022 0852  NA 133* 134* 140 140  K 4.6 4.5  5.5* 4.6  CL 99 100  --   --   CO2 19*  --   --   --   GLUCOSE 165* 161*  --   --   BUN 45* 39*  --   --   CREATININE 3.67* 3.60*  --   --   CALCIUM 8.1*  --   --   --     GFR: Estimated Creatinine Clearance: 17.2 mL/min (A) (by C-G formula based on SCr of 3.6 mg/dL (H)). Recent Labs  Lab 05/29/2022 0349 05/20/2022 0629  WBC 17.9*  --   LATICACIDVEN 6.3* 3.2*    Liver Function Tests: Recent Labs  Lab 06/15/2022 0349  AST 170*  ALT 80*  ALKPHOS 28*  BILITOT 1.3*  PROT 5.3*  ALBUMIN 2.4*    No results for input(s): "LIPASE", "AMYLASE" in the last 168 hours. No results for input(s): "AMMONIA" in the last 168 hours.  ABG:    Component Value Date/Time   PHART 7.143 (LL) 06/14/2022 0852   PCO2ART 47.4 06/02/2022 0852   PO2ART 78 (L) 05/28/2022 0852   HCO3 16.3 (L) 06/07/2022 0852   TCO2 18 (L) 06/13/2022 0852    ACIDBASEDEF 12.0 (H) 06/19/2022 0852   O2SAT 68.3 05/24/2022 1105    Coagulation Profile: Recent Labs  Lab 05/26/2022 0349  INR 2.4*    Cardiac Enzymes: No results for input(s): "CKTOTAL", "CKMB", "CKMBINDEX", "TROPONINI" in the last 168 hours.  HbA1C: No results found for: "HGBA1C"  CBG: Recent Labs  Lab 06/15/2022 0941  GLUCAP 99    This patient is critically ill with multiple organ system failure which requires frequent high complexity decision making, assessment, support, evaluation, and titration of therapies. This was completed through the application of advanced monitoring technologies and extensive interpretation of multiple databases.  During this encounter critical care time was devoted to patient care services described in this note for 67 minutes.    Jacky Kindle, MD [facility] See Amion for pager If no response to pager, please call [phone] until 7pm After 7pm, Please call E-link [phone]

## 2022-06-05 NOTE — Procedures (Signed)
Central Venous Catheter Insertion Procedure Note  [name]  [id number]  [birth year]  Date:06/04/2022  Time:11:40 AM   Provider Performing:Mariann Palo   Procedure: Insertion of Non-tunneled Central Venous Catheter(36556) with US guidance ([id number])   Indication(s) Medication administration  Consent Unable to obtain consent due to emergent nature of procedure.  Anesthesia Topical only with 1% lidocaine   Timeout Verified patient identification, verified procedure, site/side was marked, verified correct patient position, special equipment/implants available, medications/allergies/relevant history reviewed, required imaging and test results available.  Sterile Technique Maximal sterile technique including full sterile barrier drape, hand hygiene, sterile gown, sterile gloves, mask, hair covering, sterile ultrasound probe cover (if used).  Procedure Description Area of catheter insertion was cleaned with chlorhexidine and draped in sterile fashion.  With real-time ultrasound guidance a central venous catheter was placed into the left subclavian vein. Nonpulsatile blood flow and easy flushing noted in all ports.  The catheter was sutured in place and sterile dressing applied.  Complications/Tolerance None; patient tolerated the procedure well. Chest X-ray is ordered to verify placement for internal jugular or subclavian cannulation.   Chest x-ray is not ordered for femoral cannulation.  EBL Minimal  Specimen(s) None

## 2022-06-05 NOTE — ED Triage Notes (Signed)
Patient arrives via EMS for originally shortness of breath. Per EMS, patient called out for SOB and weakness. When EMS arrived, patient's HR was low 30s. Patient was found to be hypotensive, lowest BP at 42/20. Epi infusion started, ran at 41mg/min for about 30 minutes. Patient states that this is not his normal, he states that he does see a cardiologist. No pacemaker in place.

## 2022-06-05 NOTE — IPAL (Signed)
Interdisciplinary Goals of Care Family Meeting   Date carried out:: 06/16/2022  Location of the meeting: Conference room  Member's involved: Physician, Bedside Registered Nurse, and Family Member or next of kin  Durable Power of Attorney or acting medical decision maker: Loura Halt  Discussion: We discussed goals of care for [employer] .    The Clinical status was relayed to daughter and significant other in conference room at in detail.   Updated and notified of patients medical condition.     Patient remains unresponsive and will not open eyes to command, he underwent prolonged cardiac arrest, he was revived after 35-40 minutes of CPR  There is high risk that he may go into cardiac arrest again, there is a chance he may have brain injury. He does have kidney failure and sepsis  Patient's family decided to continue full scope for now until did discuss amongst each other and workup can be done to know the cause of his cardiac arrest and if he has any brain injury  Code status: Full Code  Disposition: Continue current acute care    Family are satisfied with Plan of action and management. All questions answered   Jacky Kindle MD [facility] See Amion for pager If no response to pager, please call [phone] until 7pm After 7pm, Please call E-link [phone]

## 2022-06-05 NOTE — Progress Notes (Signed)
Patient transported from cath lab to 2H17 on the ventilator with no problems.

## 2022-06-05 NOTE — Procedures (Signed)
Bronchoscopy Procedure Note  [name]  [id number]  21-Apr-1941  Date:06/06/2022  Time:3:31 PM   Provider Performing:Navika Hoopes   Procedure(s):  Flexible bronchoscopy with bronchial alveolar lavage (34287)  Indication(s) Acute respiratory failure  Consent Risks of the procedure as well as the alternatives and risks of each were explained to the patient and/or caregiver.  Consent for the procedure was obtained and is signed in the bedside chart  Anesthesia Etomidate and Rocuronium   Time Out Verified patient identification, verified procedure, site/side was marked, verified correct patient position, special equipment/implants available, medications/allergies/relevant history reviewed, required imaging and test results available.   Sterile Technique Usual hand hygiene, masks, gowns, and gloves were used   Procedure Description Bronchoscope advanced through endotracheal tube and into airway.  Airways were examined down to subsegmental level with findings noted below.   Following diagnostic evaluation, BAL(s) performed in RLL with normal saline and return of blood-tinged  fluid  Findings: Erythematous mucosa noted throughout the respiratory tree, BAL was performed in right lower lobe   Complications/Tolerance None; patient tolerated the procedure well. Chest X-ray is not needed post procedure.   EBL Minimal   Specimen(s) BAL

## 2022-06-05 NOTE — ED Notes (Signed)
Carlis Abbott, MD with ICU at bedside evaluating patient

## 2022-06-05 NOTE — ED Provider Notes (Signed)
[facility] EMERGENCY DEPARTMENT Provider Note   CSN: [id number] Arrival date & time: 05/26/2022  0342     History  Chief Complaint  Patient presents with   Shortness of Breath   Level 5 caveat due to acuity of condition [name] is a 82 y.o. male.  The history is provided by the patient and the EMS personnel.   Patient presents via EMS.  Patient has a history of aortic stenosis.  Patient reports over the past several hours while at home he started feeling generalized weakness and shortness of breath.  No syncope.  No falls.  No chest pain.  EMS was called and they report he was bradycardic and hypotensive on their assessment.  Patient was given atropine and started on epinephrine infusion.  Patient has started to improve.  He does not have a pacemaker in place.  He does report recent cardiac cath. No recent flulike illness.   No recent vomiting but has had significant decreased appetite since taking antibiotics for recent urinary tract infection.  He also reports recent diarrhea from the antibiotics  Home Medications Prior to Admission medications   Medication Sig Start Date End Date Taking? Authorizing Provider  acidophilus (RISAQUAD) CAPS capsule Take 1 capsule by mouth daily.    [provider]  Camphor-Menthol-Methyl Sal (SALONPAS) 3.05-25-08 % PTCH Apply 1 application  topically daily as needed (pain).    [provider]  cholecalciferol (VITAMIN D) 1000 units tablet Take 1,000 Units by mouth daily.    [provider]  ELIQUIS 2.5 MG TABS tablet TAKE ONE TABLET TWICE DAILY 08/27/21   Brand Males, MD  fenofibrate (TRICOR) 145 MG tablet Take 145 mg by mouth daily before breakfast.     [provider]  finasteride (PROSCAR) 5 MG tablet Take 5 mg by mouth daily. 10/07/19   [provider]  fluticasone (FLONASE) 50 MCG/ACT nasal spray Place 1 spray into both nostrils daily as needed for allergies or rhinitis.     [provider]  fluticasone-salmeterol (ADVAIR) 100-50 MCG/ACT AEPB Inhale 1 puff into the lungs 2 (two) times daily. 11/22/21   Brand Males, MD  furosemide (LASIX) 20 MG tablet Take 1 every other day alternating with 40 mg 03/14/22   Skeet Latch, MD  furosemide (LASIX) 40 MG tablet Please alternate '20mg'$  and '40mg'$  every other day. 03/13/22   Loel Dubonnet, NP  lipase/protease/amylase (CREON) 36000 UNITS CPEP capsule Take 2 capsules (72,000 Units total) by mouth 3 (three) times daily with meals. May also take 1 capsule (36,000 Units total) as needed (with snacks). 02/02/21   Noralyn Pick, NP  loratadine (CLARITIN) 10 MG tablet Take 10 mg by mouth daily as needed for allergies.    [provider]  losartan (COZAAR) 50 MG tablet Take 1 tablet (50 mg total) by mouth daily. 03/04/22 02/27/23  Loel Dubonnet, NP  metoprolol succinate (TOPROL-XL) 50 MG 24 hr tablet Take 1 tablet (50 mg total) by mouth daily. Take 2 daily Patient taking differently: Take 50 mg by mouth daily. 08/28/21   Marylu Lund., NP  NON FORMULARY Place 1,000 mg under the tongue daily. CBD Oil    [provider]  Omega-3 Fatty Acids (FISH OIL) 1000 MG CAPS Take 1,000 mg by mouth daily.    [provider]  probenecid (BENEMID) 500 MG tablet Take 500 mg by mouth daily before breakfast.     [provider]  rosuvastatin (CRESTOR) 10 MG tablet  Take 10 mg by mouth daily.    [provider]  sildenafil (VIAGRA) 100 MG tablet Take 100 mg by mouth daily as needed for erectile dysfunction.    [provider]  tamsulosin (FLOMAX) 0.4 MG CAPS capsule Take 0.4 mg by mouth daily.     [provider]      Allergies    Cefaclor and Penicillins    Review of Systems   Review of Systems  Unable to perform ROS: Acuity of condition    Physical Exam Updated Vital Signs BP (!) 79/51   Pulse (!) 32   Resp (!) 32   Ht 1.778 m ('5\' 10"'$ )   Wt  81.6 kg   SpO2 100%   BMI 25.83 kg/m  Physical Exam CONSTITUTIONAL: Elderly, ill-appearing HEAD: Normocephalic/atraumatic EYES: EOMI/PERRL ENMT: Mucous membranes moist NECK: supple no meningeal signs SPINE/BACK:entire spine nontender CV: S1/S2 noted, bradycardic LUNGS: Lungs are clear to auscultation bilaterally, no apparent distress ABDOMEN: soft, nontender NEURO: Pt is awake/alert/appropriate, moves all extremitiesx4.  No facial droop.   EXTREMITIES: pulses normal/equalx4, full ROM SKIN: Skin is cool to touch  ED Results / Procedures / Treatments   Labs (all labs ordered are listed, but only abnormal results are displayed) Labs Reviewed  COMPREHENSIVE METABOLIC PANEL - Abnormal; Notable for the following components:      Result Value   Sodium 133 (*)    CO2 19 (*)    Glucose, Bld 165 (*)    BUN 45 (*)    Creatinine, Ser 3.67 (*)    Calcium 8.1 (*)    Total Protein 5.3 (*)    Albumin 2.4 (*)    AST 170 (*)    ALT 80 (*)    Alkaline Phosphatase 28 (*)    Total Bilirubin 1.3 (*)    GFR, Estimated 16 (*)    All other components within normal limits  CBC - Abnormal; Notable for the following components:   WBC 17.9 (*)    RBC 3.66 (*)    Hemoglobin 11.4 (*)    HCT 35.8 (*)    Platelets 119 (*)    All other components within normal limits  BRAIN NATRIURETIC PEPTIDE - Abnormal; Notable for the following components:   B Natriuretic Peptide 1,545.5 (*)    All other components within normal limits  APTT - Abnormal; Notable for the following components:   aPTT 45 (*)    All other components within normal limits  PROTIME-INR - Abnormal; Notable for the following components:   Prothrombin Time 26.3 (*)    INR 2.4 (*)    All other components within normal limits  LACTIC ACID, PLASMA - Abnormal; Notable for the following components:   Lactic Acid, Venous 6.3 (*)    All other components within normal limits  I-STAT CHEM 8, ED - Abnormal; Notable for the following  components:   Sodium 134 (*)    BUN 39 (*)    Creatinine, Ser 3.60 (*)    Glucose, Bld 161 (*)    Calcium, Ion 1.07 (*)    TCO2 18 (*)    Hemoglobin 12.6 (*)    HCT 37.0 (*)    All other components within normal limits  TROPONIN I (HIGH SENSITIVITY) - Abnormal; Notable for the following components:   Troponin I (High Sensitivity) 253 (*)    All other components within normal limits  RESP PANEL BY RT-PCR (RSV, FLU A&B, COVID)  RVPGX2  CULTURE, BLOOD (ROUTINE X 2)  CULTURE, BLOOD (  ROUTINE X 2)  LACTIC ACID, PLASMA  TYPE AND SCREEN  TROPONIN I (HIGH SENSITIVITY)    EKG EKG Interpretation  Date/Time:  Wednesday June 05 2022 04:02:43 EST Ventricular Rate:  39 PR Interval:  57 QRS Duration: 151 QT Interval:  631 QTC Calculation: 509 R Axis:   -40 Text Interpretation: Marked sinus bradycardia with 2:1 A-V conduction Short PR interval RBBB and LAFB Abnormal ekg Confirmed by Ripley Fraise [phone]) on 05/27/2022 4:07:04 AM  Radiology DG Chest Port 1 View  Result Date: 06/14/2022 CLINICAL DATA:  Shortness of breath. EXAM: PORTABLE CHEST 1 VIEW COMPARISON:  03/10/2007. FINDINGS: The heart size and mediastinal contours are within normal limits. There is atherosclerotic calcification of the aorta. Patchy airspace disease is noted in the mid right lung. There is subsegmental atelectasis at the left lung base. No effusion or pneumothorax. 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 Cervical spinal fusion hardware is present. No acute osseous abnormality. IMPRESSION: Patchy airspace disease in the mid right lung, concerning for developing infiltrates. Electronically Signed   By: Brett Fairy M.D.   On: 06/02/2022 04:13    Procedures .Critical Care  Performed by: Ripley Fraise, MD Authorized by: Ripley Fraise, MD   Critical care provider statement:    Critical care time (minutes):  90   Critical care start time:  06/19/2022 4:00 AM   Critical care end time:  06/17/2022  5:30 AM   Critical care was necessary to treat or prevent imminent or life-threatening deterioration of the following conditions:  Cardiac failure, circulatory failure, shock, sepsis, respiratory failure, metabolic crisis and renal failure   Critical care was time spent personally by me on the following activities:  Examination of patient, development of treatment plan with patient or surrogate, evaluation of patient's response to treatment, pulse oximetry, ordering and review of radiographic studies, ordering and review of laboratory studies, ordering and performing treatments and interventions, re-evaluation of patient's condition, review of old charts, transcutaneous pacing, discussions with consultants and obtaining history from patient or surrogate   I assumed direction of critical care for this patient from another provider in my specialty: no     Care discussed with: admitting provider       Medications Ordered in ED Medications  lactated ringers bolus 1,000 mL (1,000 mLs Intravenous New Bag/Given 06/11/2022 0443)    And  lactated ringers bolus 1,000 mL (has no administration in time range)    And  lactated ringers bolus 500 mL (has no administration in time range)  cefTRIAXone (ROCEPHIN) 1 g in sodium chloride 0.9 % 100 mL IVPB (0 g Intravenous Stopped 05/28/2022 0521)  azithromycin (ZITHROMAX) 500 mg in sodium chloride 0.9 % 250 mL IVPB (has no administration in time range)  0.9 %  sodium chloride infusion (has no administration in time range)  norepinephrine (LEVOPHED) '4mg'$  in 26m (0.016 mg/mL) premix infusion (has no administration in time range)  sodium chloride 0.9 % bolus 1,000 mL (1,000 mLs Intravenous New Bag/Given 05/31/2022 0443)    ED Course/ Medical Decision Making/ A&P Clinical Course as of 06/18/2022 0536  Wed Jun 05, 2022  0353 Patient with severe aortic stenosis, diffuse CAD per recent cardiac cath presents with generalized weakness.  EMS noted patient was bradycardic and  hypotensive.  Workup is pending at this time.  Patient is awake and alert at this time [DW]  0355 Pre-Hospital rhythm strip reviewed, patient was bradycardic with heart rate 39, but significant artifact limits evaluation [DW]  0408 Strong suspicion for 2-1 AV  block.  Patient placed on pacer pads.  Will consult cardiology [DW]  0411 Creatinine(!): 3.60 Acute renal failure noted [DW]  0414 WBC(!): 17.9 Leukocytosis [DW]  0414 Discussed with cardiology Dr. Koleen Nimrod.  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  He will see the patient [DW]  0414 IV fluids to be given for acute renal failure [DW]  0424 Patient may also have evidence of pneumonia, with elevated white count.  Will send blood cultures, lactic acid, start IV antibiotics [DW]  0454 Patient persistently bradycardic, now hypotensive.  IV fluids started.  Will start external pacing. [DW]  [id number] Patient has been seen by cardiology Dr. Koleen Nimrod.  Patient did have a good response to transcutaneous pacing, but due to his underlying sepsis with likely pneumonia, elevated lactate and acute renal failure, at this point he is not a candidate for transvenous pacing.  Plan will be to treat his sepsis with IV fluids, IV antibiotics and cardiology recommend starting with Levophed. [DW]  6195 I have consulted critical care, who evaluate the patient for admission. [DW]    Clinical Course User Index [DW] Ripley Fraise, MD                             Medical Decision Making Amount and/or Complexity of Data Reviewed Labs: ordered. Decision-making details documented in ED Course. Radiology: ordered.  Risk Prescription drug management. Decision regarding hospitalization.   This patient presents to the ED for concern of weakness, this involves an extensive number of treatment options, and is a complaint that carries with it a high risk of complications and morbidity.  The differential diagnosis includes but is not limited to CVA, intracranial  hemorrhage, acute coronary syndrome, renal failure, urinary tract infection, electrolyte disturbance, pneumonia AV block  Comorbidities that complicate the patient evaluation: Patient's presentation is complicated by their history of aortic stenosis  Social Determinants of Health: Patient's  occasional tobacco use   increases the complexity of managing their presentation  Additional history obtained: Additional history obtained from EMS discussed with paramedic at bedside Records reviewed  cardiology notes reviewed  Lab Tests: I Ordered, and personally interpreted labs.  The pertinent results include: Acute renal failure, leukocytosis  Imaging Studies ordered: I ordered imaging studies including X-ray chest   I independently visualized and interpreted imaging which showed pneumonia I agree with the radiologist interpretation  Cardiac Monitoring: The patient was maintained on a cardiac monitor.  I personally viewed and interpreted the cardiac monitor which showed an underlying rhythm of:  Heart Block  Medicines ordered and prescription drug management: I ordered medication including IV fluids, antibiotics for pneumonia Reevaluation of the patient after these medicines showed that the patient    stayed the same   Critical Interventions:   transcutaneous pacing, IV fluids, IV antibiotics  Consultations Obtained: I requested consultation with the consultant cardiology Dr. Koleen Nimrod , and discussed  findings as well as pertinent plan - they recommend: Will evaluate the patient  Reevaluation: After the interventions noted above, I reevaluated the patient and found that they have :stayed the same  Complexity of problems addressed: Patient's presentation is most consistent with  acute presentation with potential threat to life or bodily function  Disposition: After consideration of the diagnostic results and the patient's response to treatment,  I feel that the patent would  benefit from admission   .           Final Clinical Impression(s) /  ED Diagnoses Final diagnoses:  Heart block  AKI (acute kidney injury) ([place])  Community acquired pneumonia of right middle lobe of lung    Rx / DC Orders ED Discharge Orders     None         Ripley Fraise, MD 05/22/2022 [phone]

## 2022-06-05 NOTE — ED Notes (Signed)
IV team at bedside to place PIV for norepi

## 2022-06-05 NOTE — ED Notes (Signed)
Colletta Maryland, PA-C with ICU at bedside with patient evaluating and going over admission plans.

## 2022-06-05 NOTE — ED Notes (Signed)
Cardiology at bedside to evaluate patient.

## 2022-06-05 NOTE — Procedures (Signed)
Cardiopulmonary Resuscitation Note  [name]  [id number]  [birth year]  Date:06/01/2022  Time:8:18 AM   Provider Performing:Awab Abebe   Procedure: Cardiopulmonary Resuscitation [id number])  Indication(s) Loss of Pulse  Consent N/A  Anesthesia N/A   Time Out N/A   Sterile Technique Hand hygiene, gloves   Procedure Description Called to patient's room for CODE BLUE. Initial rhythm was PEA/Asystole. Patient received high quality chest compressions for 35 minutes with defibrillation or cardioversion when appropriate. Epinephrine was administered every 3 minutes as directed by time Therapist, nutritional. Additional pharmacologic interventions included amiodarone, atropine, calcium chloride, magnesium, and sodium bicarbonate. Additional procedural interventions include intra-osseus line.  Return of spontaneous circulation was achieved.  Family called and notified.   Complications/Tolerance N/A   EBL N/A   Specimen(s) N/A  Estimated time to ROSC: 31mnutes

## 2022-06-05 NOTE — ED Notes (Signed)
Per cardiology, hold on any IV fluids at this time due to patient potentially being in heart failure.

## 2022-06-05 NOTE — ED Provider Notes (Signed)
I was called to room as patient went into cardiac arrest. CPR was started immediately  and he was given epinephrine.  Patient regained pulses, and I was able to intubate the patient.  Just at the time of intubation, patient coded again.  Patient was otherwise intubated without difficulty.  Cardiology Dr. Ali Lowe and ICU Dr Tacy Learn at bedside.  Prolonged resuscitation was initiated.  Patient was given multiple rounds of epinephrine, atropine, bicarb.  Patient did have episodes of ventricular tachycardia for which he was defibrillated.  Pt now in sinus rhythm with ROSC  Procedure Name: Intubation Date/Time: 05/31/2022 7:10 AM  Performed by: Ripley Fraise, MDPre-anesthesia Checklist: Suction available, Patient being monitored and Patient identified Oxygen Delivery Method: Non-rebreather mask Preoxygenation: Pre-oxygenation with 100% oxygen Induction Type: Rapid sequence Laryngoscope Size: Glidescope Grade View: Grade I Number of attempts: 1 Airway Equipment and Method: Video-laryngoscopy Placement Confirmation: ETT inserted through vocal cords under direct vision and CO2 detector Secured at: 23 cm    CPR  Date/Time: 06/14/2022 7:48 AM  Performed by: Ripley Fraise, MD Authorized by: Ripley Fraise, MD  CPR Procedure Details:    CPR/ACLS performed in the ED: Yes     Duration of CPR (minutes):  25   Outcome: ROSC obtained    CPR performed via ACLS guidelines under my direct supervision.  See RN documentation for details including defibrillator use, medications, doses and timing.     Ripley Fraise, MD 06/04/2022 [phone]

## 2022-06-05 NOTE — ED Notes (Signed)
Carlis Abbott, MD messaged at this time to notify of patient's increased work of breathing

## 2022-06-05 NOTE — Progress Notes (Signed)
Patient was transported on the ventilator by Esperanza Sheets RRT from the ED to the cath lab with no problems. Pt handed off to me & I stayed with pt throughout cath procedure.

## 2022-06-05 NOTE — Procedures (Signed)
Insertion of Chest Tube Procedure Note  [name]  [id number]  1941-05-08  Date:06/12/2022  Time:12:58 PM    Provider Performing: Jacky Kindle   Procedure: Pleural Catheter Insertion w/ Imaging Guidance [phone])  Indication(s) Pneumothorax  Consent Risks of the procedure as well as the alternatives and risks of each were explained to the patient and/or caregiver.  Consent for the procedure was obtained and is signed in the bedside chart  Anesthesia Topical only with 1% lidocaine    Time Out Verified patient identification, verified procedure, site/side was marked, verified correct patient position, special equipment/implants available, medications/allergies/relevant history reviewed, required imaging and test results available.   Sterile Technique Maximal sterile technique including full sterile barrier drape, hand hygiene, sterile gown, sterile gloves, mask, hair covering, sterile ultrasound probe cover (if used).   Procedure Description Ultrasound used to identify appropriate pleural anatomy for placement and overlying skin marked. Area of placement cleaned and draped in sterile fashion.  A 14 French pigtail pleural catheter was placed into the right pleural space using Seldinger technique. Appropriate return of air was obtained.  The tube was connected to atrium and placed on -20 cm H2O wall suction.   Complications/Tolerance None; patient tolerated the procedure well. Chest X-ray is ordered to verify placement.   EBL Minimal  Specimen(s) none

## 2022-06-05 NOTE — H&P (Signed)
NAME:  CHESLEY VEASEY, MRN:  [id number], DOB:  1941/04/14, LOS: 0 ADMISSION DATE:  05/25/2022 CONSULTATION DATE:  06/09/2022 REFERRING MD:  Christy Gentles - EDP CHIEF COMPLAINT:  CHB, hypotension, ?sepsis   History of Present Illness:  Mr. Kalisz 82 year old gentleman with a history of provoked PE, mild COPD, OSA, severe AS who presented with acute SOB and weakness that began last evening. He has a mild chronic cough that is unchanged. With EMS he was noted to have a heart rate in the 30s and hypotension, as low as 42/20. He was stared on epi and atropine in the field. He was TC paced in the ED with an appropriate response. Cardiology evaluated the patient at bedside and stopped TC pacing and felt that he was a poor candidate for TV pacing. CXR concerning for possible R pneumonia, and he was given fluids and antibiotics for sepsis after blood cultures were collected. He denies change in baseline cough. He has been compliant with his inhalers. He has increase SOB that began suddenly last night. He denies CP. Recently he has had a decreased appetite since treatment for a UTI. He complaints today of feeling thirsty.   Pertinent Medical History:   Past Medical History:  Diagnosis Date   Anxiety    reports scotch helps that    Aortic stenosis, mild 08/18/2018   Mean gradient 14 mmHg 04/2018   Arthritis    " it's everywhere"   Blood transfusion without reported diagnosis    Cataract    having cataract surgery July 2016   Chronic bronchitis ([place])    Chronic kidney disease    hx kidney stones   Colon polyps    Complication of anesthesia    after surgery 07-2013 unable to sleep for 3 days   COPD (chronic obstructive pulmonary disease) ([place])    Coronary artery calcification seen on CAT scan 08/18/2018   GERD (gastroesophageal reflux disease)    History of kidney stones    Hyperlipidemia    Hypertension    Prostate hypertrophy    Pulmonary embolism ([place]) 12/2014   post TJR   Seasonal allergies     Skin cancer    squamous cell, 1 melanoma left shoulder, one episode- under tongue, has had a total of 17-18 episodes    Sleep apnea    had surgery in 2000 for sleep apnea, no CPAP needed   Thoracic aortic aneurysm ([place]) 05/04/2019   4.6 cm 04/2019   Significant Hospital Events: Including procedures, antibiotic start and stop dates in addition to other pertinent events   1/17 admitted, started on epi infusion  Interim History / Subjective:    Objective:  Blood pressure (!) 79/51, pulse (!) 32, resp. rate (!) 32, height '5\' 10"'$  (1.778 m), weight 81.6 kg, SpO2 100 %.        Intake/Output Summary (Last 24 hours) at 05/29/2022 0536 Last data filed at 05/28/2022 9242 Gross per 24 hour  Intake 100 ml  Output --  Net 100 ml   Filed Weights   06/09/2022 0345  Weight: 81.6 kg    Physical Examination: General: elderly man lying in bed in NAD HEENT: [place]/AT, eyes anicteric, dry oral mucosa Neuro: Awake, alert, able to pull himself up to sitting independently.  CV:  S1S2, bradycardic, reg rhythm PULM: breathing comfortably on , faint rhales in R base and wheezing anteriorly on the R. CTA on the left. GI: Soft, nontender, nondistended  Extremities: Bilateral ankle edema, no cyanosis. Extremities not cool or cyanotic.  Skin: Warm/dry, scars on legs  Covid, flu, RSV negative. Na+  133 Bicarb 19 BUN 45 Cr 3.67 AST 170 ALT 80 WBC 17.9 H/H 11.4/35.8 Platelets 119 LA 6.3 INR 2.4 BNP 1545.5 Trop 253  CXR personally reviewed>  faint superior segment opacity. Likely right diaphragm eventration.   Resolved Hospital Problem List:    Assessment & Plan:  Complete heart block -anticipate he may need TCP due to evidence of end-organ hypoperfusion; will discuss with Cardiology -IVF for sepsis; need to ensure he is not volume down until we can restore adequate CO with normalized HR -epi gtt -restart TCP if mental status deteriorates; keep pads on -tele monitoring -STAT echo  Lactic  acidosis -IVF -empiric antibiotics -anticipate will need improved HR with inotropic meds vs TVP  AKI on CKD 3b -renally dose meds, avoid nephrotoxic meds -IVF resuscitation; stopped early due to SOB worsening  Thrombocytopenia Anemia -transfuse for Hb <7 or hemodynamically significant bleeding -monitor  Coagulopathy; history of low dose Eliquis at home for DVT prophylaxis -heparin gtt -hold Eliquis now  Hyponatremia -monitor -avoid hypotonic fluids  Elevated transaminases & hyperbilirubinemia, likely due to acute heart failure from CHB -epi gtt; will discuss need for TVP with cardiology  Hyperglycemia -SSI PRN -goal BG <180 -check A1c  Acute respiratory failure with hypoxia, possibly RLL pneumonia -empiric CAP antibiotics; check Legionella and pneumococcus -serial LA levels -given sepsis fluids in the ED  History of urinary retention; previous indwelling foley (now straight cathing) -con't finasteride & tamsulosin  HLD -con't statin, fenofibrate  Best Practice: (right click and "Reselect all SmartList Selections" daily)   Diet/type: NPO w/ oral meds DVT prophylaxis: systemic heparin GI prophylaxis: N/A Lines: N/A Foley:  N/A Code Status:  full code Last date of multidisciplinary goals of care discussion '[ ]'$   Labs:  CBC: Recent Labs  Lab 06/04/2022 0349 05/31/2022 0407  WBC 17.9*  --   HGB 11.4* 12.6*  HCT 35.8* 37.0*  MCV 97.8  --   PLT 119*  --    Basic Metabolic Panel: Recent Labs  Lab 05/26/2022 0349 06/04/2022 0407  NA 133* 134*  K 4.6 4.5  CL 99 100  CO2 19*  --   GLUCOSE 165* 161*  BUN 45* 39*  CREATININE 3.67* 3.60*  CALCIUM 8.1*  --    GFR: Estimated Creatinine Clearance: 17.2 mL/min (A) (by C-G formula based on SCr of 3.6 mg/dL (H)). Recent Labs  Lab 05/20/2022 0349  WBC 17.9*  LATICACIDVEN 6.3*   Liver Function Tests: Recent Labs  Lab 06/04/2022 0349  AST 170*  ALT 80*  ALKPHOS 28*  BILITOT 1.3*  PROT 5.3*  ALBUMIN 2.4*    No results for input(s): "LIPASE", "AMYLASE" in the last 168 hours. No results for input(s): "AMMONIA" in the last 168 hours.  ABG:    Component Value Date/Time   PHART 7.410 03/29/2022 1236   PCO2ART 34.0 03/29/2022 1236   PO2ART 59 (L) 03/29/2022 1236   HCO3 21.5 03/29/2022 1236   TCO2 18 (L) 05/29/2022 0407   ACIDBASEDEF 3.0 (H) 03/29/2022 1236   O2SAT 91 03/29/2022 1236    Coagulation Profile: Recent Labs  Lab 05/31/2022 0349  INR 2.4*   Cardiac Enzymes: No results for input(s): "CKTOTAL", "CKMB", "CKMBINDEX", "TROPONINI" in the last 168 hours.  HbA1C: No results found for: "HGBA1C"  CBG: No results for input(s): "GLUCAP" in the last 168 hours.  Review of Systems:   Review of Systems  Constitutional:  Negative for chills and fever.  HENT: Negative.    Respiratory:  Positive for cough and shortness of breath.   Gastrointestinal: Negative.        Poor appetite  Musculoskeletal:  Negative for myalgias.  Skin:  Negative for rash.  Neurological:  Positive for weakness.     Past Medical History:  He,  has a past medical history of Anxiety, Aortic stenosis, mild (08/18/2018), Arthritis, Blood transfusion without reported diagnosis, Cataract, Chronic bronchitis ([place]), Chronic kidney disease, Colon polyps, Complication of anesthesia, COPD (chronic obstructive pulmonary disease) ([place]), Coronary artery calcification seen on CAT scan (08/18/2018), GERD (gastroesophageal reflux disease), History of kidney stones, Hyperlipidemia, Hypertension, Prostate hypertrophy, Pulmonary embolism ([place]) (12/2014), Seasonal allergies, Skin cancer, Sleep apnea, and Thoracic aortic aneurysm ([place]) (05/04/2019).   Surgical History:   Past Surgical History:  Procedure Laterality Date   ANTERIOR CERVICAL DECOMP/DISCECTOMY FUSION N/A 11/30/2020   Procedure: Cervical Four-Five, Cervical Five-Six, Cervical Six-Seven Anterior cervical decompression/discectomy/fusion;  Surgeon: Kristeen Miss, MD;   Location: [facility];  Service: Neurosurgery;  Laterality: N/A;   CAROTID ENDARTERECTOMY Right 10/01/2013   CE   COLONOSCOPY     ENDARTERECTOMY Right 10/01/2013   Procedure: ENDARTERECTOMY CAROTID-RIGHT;  Surgeon: Rosetta Posner, MD;  Location: [facility];  Service: Vascular;  Laterality: Right;   ESOPHAGOGASTRODUODENOSCOPY ENDOSCOPY  10/12/2013   EXCISION ORAL LESION WITH CO2 LASER N/A 09/08/2015   Procedure: EXCISION ORAL LESION WITH CO2 LASER;  Surgeon: Jerrell Belfast, MD;  Location: [facility];  Service: ENT;  Laterality: N/A;   EYE SURGERY Bilateral    cataracts, W/IOL   JOINT REPLACEMENT Right 09/02/2014   Knee   JOINT REPLACEMENT Left 2012   Knee   PATCH ANGIOPLASTY Right 10/01/2013   Procedure: PATCH ANGIOPLASTY USING HEMASHIELD FINESSE PATCH;  Surgeon: Rosetta Posner, MD;  Location: [facility];  Service: Vascular;  Laterality: Right;   removal of melonomia Left    shoulder   REPLACEMENT TOTAL KNEE Left    REVERSE SHOULDER ARTHROPLASTY Right 10/11/2016   Procedure: REVERSE RIGHT SHOULDER ARTHROPLASTY;  Surgeon: Netta Cedars, MD;  Location: [facility];  Service: Orthopedics;  Laterality: Right;   REVERSE SHOULDER ARTHROPLASTY Left 12/03/2019   Procedure: REVERSE SHOULDER ARTHROPLASTY;  Surgeon: Netta Cedars, MD;  Location: [facility];  Service: Orthopedics;  Laterality: Left;  interscalene block   RHINOPLASTY     1999   RHINOPLASTY     RIGHT/LEFT HEART CATH AND CORONARY ANGIOGRAPHY N/A 03/29/2022   Procedure: RIGHT/LEFT HEART CATH AND CORONARY ANGIOGRAPHY;  Surgeon: Early Osmond, MD;  Location: [facility];  Service: Cardiovascular;  Laterality: N/A;   SKIN CANCER EXCISION     squamous cell cancer under tongue   SKIN SURGERY     squamous cell carcinoma removed in various places,melanoma removed from L shoulder   sleep apnea surgery  2000   SPINAL FUSION N/A 11/30/2020   STERIOD INJECTION Left 10/11/2016   Procedure: STEROID INJECTION LEFT SHOULDER;  Surgeon: Netta Cedars, MD;  Location: [facility];  Service: Orthopedics;  Laterality: Left;   TOTAL HIP ARTHROPLASTY Left 10/01/2017   Procedure: LEFT TOTAL HIP ARTHROPLASTY ANTERIOR APPROACH;  Surgeon: Gaynelle Arabian, MD;  Location: [facility];  Service: Orthopedics;  Laterality: Left;   TOTAL KNEE ARTHROPLASTY Right 09/02/2014   Procedure: RIGHT TOTAL KNEE ARTHROPLASTY;  Surgeon: Netta Cedars, MD;  Location: [facility];  Service: Orthopedics;  Laterality: Right;   Social History:   reports that he has been smoking cigars. He has never used smokeless tobacco. He reports current alcohol use  of about 21.0 standard drinks of alcohol per week. He reports that he does not use drugs.   Family History:  His family history includes Diverticulosis in his mother; Heart attack in his father and mother; Heart disease in his father; Hyperlipidemia in his father; Skin cancer in his mother; Varicose Veins in his mother. There is no history of Colon cancer, Esophageal cancer, Rectal cancer, or Stomach cancer.   Allergies: Allergies  Allergen Reactions   Cefaclor Other (See Comments)     skin split   Penicillins Rash    Has patient had a PCN reaction causing immediate rash, facial/tongue/throat swelling, SOB or lightheadedness with hypotension:unsure Has patient had a PCN reaction causing severe rash involving mucus membranes or skin necrosis:unsure Has patient had a PCN reaction that required hospitalization:No Has patient had a PCN reaction occurring within the last 10 years:No--childhood allergy If all of the above answers are "NO", then may proceed with Cephalosporin use.    Home Medications: Prior to Admission medications   Medication Sig Start Date End Date Taking? Authorizing Provider  acidophilus (RISAQUAD) CAPS capsule Take 1 capsule by mouth daily.    [provider]  Camphor-Menthol-Methyl Sal (SALONPAS) 3.05-25-08 % PTCH Apply 1 application  topically daily as needed (pain).    [provider]  cholecalciferol (VITAMIN D) 1000  units tablet Take 1,000 Units by mouth daily.    [provider]  ELIQUIS 2.5 MG TABS tablet TAKE ONE TABLET TWICE DAILY 08/27/21   Brand Males, MD  fenofibrate (TRICOR) 145 MG tablet Take 145 mg by mouth daily before breakfast.     [provider]  finasteride (PROSCAR) 5 MG tablet Take 5 mg by mouth daily. 10/07/19   [provider]  fluticasone (FLONASE) 50 MCG/ACT nasal spray Place 1 spray into both nostrils daily as needed for allergies or rhinitis.    [provider]  fluticasone-salmeterol (ADVAIR) 100-50 MCG/ACT AEPB Inhale 1 puff into the lungs 2 (two) times daily. 11/22/21   Brand Males, MD  furosemide (LASIX) 20 MG tablet Take 1 every other day alternating with 40 mg 03/14/22   Skeet Latch, MD  furosemide (LASIX) 40 MG tablet Please alternate '20mg'$  and '40mg'$  every other day. 03/13/22   Loel Dubonnet, NP  lipase/protease/amylase (CREON) 36000 UNITS CPEP capsule Take 2 capsules (72,000 Units total) by mouth 3 (three) times daily with meals. May also take 1 capsule (36,000 Units total) as needed (with snacks). 02/02/21   Noralyn Pick, NP  loratadine (CLARITIN) 10 MG tablet Take 10 mg by mouth daily as needed for allergies.    [provider]  losartan (COZAAR) 50 MG tablet Take 1 tablet (50 mg total) by mouth daily. 03/04/22 02/27/23  Loel Dubonnet, NP  metoprolol succinate (TOPROL-XL) 50 MG 24 hr tablet Take 1 tablet (50 mg total) by mouth daily. Take 2 daily Patient taking differently: Take 50 mg by mouth daily. 08/28/21   Marylu Lund., NP  NON FORMULARY Place 1,000 mg under the tongue daily. CBD Oil    [provider]  Omega-3 Fatty Acids (FISH OIL) 1000 MG CAPS Take 1,000 mg by mouth daily.    [provider]  probenecid (BENEMID) 500 MG tablet Take 500 mg by mouth daily before breakfast.     [provider]  rosuvastatin (CRESTOR) 10 MG tablet Take 10 mg by mouth daily.     [provider]  sildenafil (VIAGRA) 100 MG tablet Take 100 mg by mouth  daily as needed for erectile dysfunction.    [provider]  tamsulosin (FLOMAX) 0.4 MG CAPS capsule Take 0.4 mg by mouth daily.     [provider]   Critical care time: 55 min.    Julian Hy, DO 06/06/2022 7:00 AM [facility]  For contact information, see Amion. If no response to pager, please call PCCM consult pager. After hours, 7PM- 7AM, please call [place].

## 2022-06-05 NOTE — Sepsis Progress Note (Signed)
Following per sepsis protocol  ? ?

## 2022-06-05 NOTE — Progress Notes (Addendum)
Advanced Heart Failure Rounding Note  PCP-Cardiologist: Skeet Latch, MD   Subjective:     Seen in ICU with CCM post placement of transvenous pacer.   Currently on Epi 20 and NE 4. MAP ~ 60.  Sedated on vent.   CXR with R pneumothorax.    Objective:   Weight Range: 81.6 kg Body mass index is 25.83 kg/m.   Vital Signs:   Pulse Rate:  [30-159] 80 (01/17 0809) Resp:  [15-54] 30 (01/17 0809) BP: (70-120)/(33-96) 120/55 (01/17 0809) SpO2:  [92 %-100 %] 100 % (01/17 0809) FiO2 (%):  [100 %] 100 % (01/17 0930) Weight:  [81.6 kg] 81.6 kg (01/17 0345)    Weight change: Filed Weights   06/12/2022 0345  Weight: 81.6 kg    Intake/Output:   Intake/Output Summary (Last 24 hours) at 06/11/2022 1019 Last data filed at 05/21/2022 6010 Gross per 24 hour  Intake 3100 ml  Output --  Net 3100 ml      Physical Exam    General: Critically ill appearing on vent HEENT: Normal Neck: Supple. JVP not elevated . Carotids 2+ bilat; no bruits.  Cor: PMI nondisplaced. Regular rate & rhythm. No rubs, gallops, 3/6 AS murmur Lungs: coarse Abdomen: Soft, nontender, nondistended.  Extremities: No cyanosis, clubbing, rash, extremities are cool Neuro: Sedated on vent   Telemetry   V paced 80   Labs    CBC Recent Labs    06/06/2022 0349 05/30/2022 0407 05/25/2022 0752 05/30/2022 0852  WBC 17.9*  --   --   --   HGB 11.4*   < > 10.5* 11.2*  HCT 35.8*   < > 31.0* 33.0*  MCV 97.8  --   --   --   PLT 119*  --   --   --    < > = values in this interval not displayed.   Basic Metabolic Panel Recent Labs    05/26/2022 0349 06/09/2022 0407 06/19/2022 0752 06/02/2022 0852  NA 133* 134* 140 140  K 4.6 4.5 5.5* 4.6  CL 99 100  --   --   CO2 19*  --   --   --   GLUCOSE 165* 161*  --   --   BUN 45* 39*  --   --   CREATININE 3.67* 3.60*  --   --   CALCIUM 8.1*  --   --   --    Liver Function Tests Recent Labs    06/14/2022 0349  AST 170*  ALT 80*  ALKPHOS 28*  BILITOT 1.3*  PROT  5.3*  ALBUMIN 2.4*   No results for input(s): "LIPASE", "AMYLASE" in the last 72 hours. Cardiac Enzymes No results for input(s): "CKTOTAL", "CKMB", "CKMBINDEX", "TROPONINI" in the last 72 hours.  BNP: BNP (last 3 results) Recent Labs    05/29/2022 0238  BNP 1,545.5*    ProBNP (last 3 results) No results for input(s): "PROBNP" in the last 8760 hours.   D-Dimer No results for input(s): "DDIMER" in the last 72 hours. Hemoglobin A1C No results for input(s): "HGBA1C" in the last 72 hours. Fasting Lipid Panel No results for input(s): "CHOL", "HDL", "LDLCALC", "TRIG", "CHOLHDL", "LDLDIRECT" in the last 72 hours. Thyroid Function Tests No results for input(s): "TSH", "T4TOTAL", "T3FREE", "THYROIDAB" in the last 72 hours.  Invalid input(s): "FREET3"  Other results:   Imaging    CARDIAC CATHETERIZATION  Result Date: 05/31/2022 1.  Successful temporary pacemaker placement from the right internal jugular site with threshold  of 0.2 mA; the pacemaker was set to an output of 10 mA with a rate of 80 bpm. 2.  Successful radial arterial line placement. Recommendation: Due to pH is 7.14 with a bicarbonate of 16, 3 A of bicarbonate were administered in the lab.  The patient will be transported to the [facility] unit for further evaluation and treatment.  The patient's family was updated on the procedure.   DG Chest Port 1 View  Result Date: 05/24/2022 CLINICAL DATA:  Shortness of breath. EXAM: PORTABLE CHEST 1 VIEW COMPARISON:  03/10/2007. FINDINGS: The heart size and mediastinal contours are within normal limits. There is atherosclerotic calcification of the aorta. Patchy airspace disease is noted in the mid right lung. There is subsegmental atelectasis at the left lung base. No effusion or pneumothorax. 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 Cervical spinal fusion hardware is present. No acute osseous abnormality. IMPRESSION: Patchy airspace disease in the mid right lung, concerning for  developing infiltrates. Electronically Signed   By: Brett Fairy M.D.   On: 06/02/2022 04:13     Medications:     Scheduled Medications:  docusate  100 mg Per Tube BID   fenofibrate  160 mg Oral Daily   finasteride  5 mg Oral Daily   fluticasone furoate-vilanterol  1 puff Inhalation Daily   insulin aspart  1-3 Units Subcutaneous Q4H   polyethylene glycol  17 g Per Tube Daily   probenecid  500 mg Oral QAC breakfast   rosuvastatin  10 mg Oral Daily   tamsulosin  0.4 mg Oral Daily    Infusions:  sodium chloride     azithromycin 500 mg (05/20/2022 0612)   [START ON 06/06/2022] cefTRIAXone (ROCEPHIN)  IV     epinephrine 20 mcg/min (06/06/2022 0750)   heparin     norepinephrine (LEVOPHED) Adult infusion 5 mcg/min (05/23/2022 0737)   propofol     propofol (DIPRIVAN) infusion 10 mcg/kg/min (06/01/2022 0954)   propofol      PRN Medications: acetaminophen, docusate sodium, fentaNYL (SUBLIMAZE) injection, fentaNYL (SUBLIMAZE) injection, fluticasone, polyethylene glycol, propofol, propofol    Patient Profile   82 y.o. male with history of severe AS, CKD IIIb, urinary retention, hx DVT/PE, thoracic aortic aneurysm, nonobstructive CAD. Presented with complete heart block >> cardiac arrest and shock.   Assessment/Plan   Cardiac arrest Complete heart block:  -Initially transcutaneously placed. Developed PEA arrest as well as episodes of VT for which he was defibrillated and received IV amiodarone. Subsequently underwent emergent temporary pacer this am.  2. Shock, likely cardiogenic +/- component of septic: -In setting of complete heart block followed by cardiac arrest requiring temporary pacer -Has severe AS at baseline. EF previously preserved. Echo pending -Prior RHC 11/23: RA mean 7, PCWP mean 16, PA mean 25, Fick CO/CI 12.8/6.4 -Doubt ACS, HS troponin mildly elevated with flat trend. Nonobstructive CAD on cath 11/23. -Started on empiric abx for possible PNA, developing infiltrate noted  right mid lung -recent UTI, UA/culture pending. Urine sample appears murky. -Initially given IVF for hypotension. Now on pressor support with Epi 20 and NE 4. -Lactic acid 6.3>3.2. Trend to clearance. -Check CO-OX. CVP monitoring.  3. Severe aortic valve stenosis: -Echo 09/23: EF 60-65%, moderate LVH, RV okay, severe AS with mean gradient 37 mmhg, AVA 0.8 cm2 and DI 0.24 -Seen by structural team 11/23 - decided watchful waiting d/t lack of symptoms and preserved LV function but completed preTAVR workup  4. Acute respiratory failure with hypoxia -Intubated, vent management per CCM -On abx  with azithromycin and ceftriaxone for possible PNA  5. AKI on CKD IIIb: -Baseline Scr 1.5-1.8 -Scr 3.67 on admit -Likely d/t shock  6. Recent UTI Urinary retention -Prior indwelling foley, now straight caths   7. Elevated transaminases and Tbili -Mildly elevated -Monitor -In setting of shock  8. 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 I did not find prior hx of Afib in record - Heparin gtt  9. Right pneumothorax - Post CPR - CT placement  10. Neuro - Unresponsive and not following commands - Had prolonged arrest, total down time 35-40 min. Poss anoxic brain injury   GOC: Remains full code for now.  Family plans to discuss further amongst themselves depending on his progress and neuro status   Length of Stay: 0  [facility], [place], PA-C  06/02/2022, 10:19 AM  Advanced Heart Failure Team Pager [phone] (M-F; 7a - 5p)  Please contact [facility] for night-coverage after hours (5p -7a ) and weekends on [email or web address]  Patient seen and examined with the above-signed Advanced Practice Provider and/or Housestaff. I personally reviewed laboratory data, imaging studies and relevant notes. I independently examined the patient and formulated the important aspects of the plan. I have edited the note to reflect any of my changes or salient points. I have personally discussed the  plan with the patient and/or family.  Had prolonged PEA/asystolic arrest this am with > 59mns CPR.   Now s/p TVP placement. Also had CPR-related PTX now s/p chest tube. Minimal urine output. Scr up to 3.8  General:  Elderly male on vent. Unresponsive HEENT: normal + ETT Neck: supple. RIJ TVP. Carotids 2+ bilat; no bruits. No lymphadenopathy or thryomegaly appreciated. Cor: PMI nondisplaced. Regular rate & rhythm. +AS + right CT Lungs: clear Abdomen: soft, nontender, nondistended. No hepatosplenomegaly. No bruits or masses. Good bowel sounds. Extremities: no cyanosis, clubbing, rash, edema Neuro: unresponsive  He now has MSOF post-arrest and requiring TVP. Will continue supportive care for now but I am concerned about risk of anoxic brain injury and progressive MSOF. Plan full support for now.   Echo EF 60-65% severe AS   CRITICAL CARE Performed by: BGlori Bickers Total critical care time: 45 minutes  Critical care time was exclusive of separately billable procedures and treating other patients.  Critical care was necessary to treat or prevent imminent or life-threatening deterioration.  Critical care was time spent personally by me (independent of midlevel providers or residents) on the following activities: development of treatment plan with patient and/or surrogate as well as nursing, discussions with consultants, evaluation of patient's response to treatment, examination of patient, obtaining history from patient or surrogate, ordering and performing treatments and interventions, ordering and review of laboratory studies, ordering and review of radiographic studies, pulse oximetry and re-evaluation of patient's condition.  DGlori Bickers MD  6:27 PM

## 2022-06-06 ENCOUNTER — Inpatient Hospital Stay (HOSPITAL_COMMUNITY): Payer: Medicare Other

## 2022-06-06 DIAGNOSIS — I469 Cardiac arrest, cause unspecified: Secondary | ICD-10-CM

## 2022-06-06 DIAGNOSIS — I442 Atrioventricular block, complete: Secondary | ICD-10-CM

## 2022-06-06 DIAGNOSIS — N179 Acute kidney failure, unspecified: Secondary | ICD-10-CM | POA: Diagnosis not present

## 2022-06-06 DIAGNOSIS — R570 Cardiogenic shock: Secondary | ICD-10-CM | POA: Diagnosis not present

## 2022-06-06 DIAGNOSIS — A419 Sepsis, unspecified organism: Secondary | ICD-10-CM

## 2022-06-06 DIAGNOSIS — R918 Other nonspecific abnormal finding of lung field: Secondary | ICD-10-CM | POA: Diagnosis not present

## 2022-06-06 DIAGNOSIS — J189 Pneumonia, unspecified organism: Secondary | ICD-10-CM | POA: Diagnosis not present

## 2022-06-06 DIAGNOSIS — R0902 Hypoxemia: Secondary | ICD-10-CM | POA: Diagnosis not present

## 2022-06-06 DIAGNOSIS — I459 Conduction disorder, unspecified: Secondary | ICD-10-CM | POA: Diagnosis not present

## 2022-06-06 LAB — MAGNESIUM
Magnesium: 2.4 mg/dL (ref 1.7–2.4)
Magnesium: 2.2 mg/dL (ref 1.7–2.4)

## 2022-06-06 LAB — CBC
HCT: 34.3 % — ABNORMAL LOW (ref 39.0–52.0)
Hemoglobin: 11.4 g/dL — ABNORMAL LOW (ref 13.0–17.0)
MCH: 30.6 pg (ref 26.0–34.0)
MCHC: 33.2 g/dL (ref 30.0–36.0)
MCV: 92.2 fL (ref 80.0–100.0)
Platelets: 70 10*3/uL — ABNORMAL LOW (ref 150–400)
RBC: 3.72 MIL/uL — ABNORMAL LOW (ref 4.22–5.81)
RDW: 14.4 % (ref 11.5–15.5)
WBC: 21.2 10*3/uL — ABNORMAL HIGH (ref 4.0–10.5)
nRBC: 0.1 % (ref 0.0–0.2)

## 2022-06-06 LAB — POCT I-STAT EG7
Acid-Base Excess: 8 mmol/L — ABNORMAL HIGH (ref 0.0–2.0)
Bicarbonate: 32.7 mmol/L — ABNORMAL HIGH (ref 20.0–28.0)
Calcium, Ion: 0.92 mmol/L — ABNORMAL LOW (ref 1.15–1.40)
HCT: 34 % — ABNORMAL LOW (ref 39.0–52.0)
Hemoglobin: 11.6 g/dL — ABNORMAL LOW (ref 13.0–17.0)
O2 Saturation: 71 %
Patient temperature: 38
Potassium: 3.6 mmol/L (ref 3.5–5.1)
Sodium: 139 mmol/L (ref 135–145)
TCO2: 34 mmol/L — ABNORMAL HIGH (ref 22–32)
pCO2, Ven: 45.6 mmHg (ref 44–60)
pH, Ven: 7.467 — ABNORMAL HIGH (ref 7.25–7.43)
pO2, Ven: 37 mmHg (ref 32–45)

## 2022-06-06 LAB — HEPATIC FUNCTION PANEL
ALT: 1719 U/L — ABNORMAL HIGH (ref 0–44)
AST: 5861 U/L — ABNORMAL HIGH (ref 15–41)
Albumin: 2 g/dL — ABNORMAL LOW (ref 3.5–5.0)
Alkaline Phosphatase: 48 U/L (ref 38–126)
Bilirubin, Direct: 2.3 mg/dL — ABNORMAL HIGH (ref 0.0–0.2)
Indirect Bilirubin: 1.5 mg/dL — ABNORMAL HIGH (ref 0.3–0.9)
Total Bilirubin: 3.8 mg/dL — ABNORMAL HIGH (ref 0.3–1.2)
Total Protein: 4.5 g/dL — ABNORMAL LOW (ref 6.5–8.1)

## 2022-06-06 LAB — BLOOD CULTURE ID PANEL (REFLEXED) - BCID2

## 2022-06-06 LAB — COOXEMETRY PANEL
Carboxyhemoglobin: 1.2 % (ref 0.5–1.5)
Methemoglobin: <0.7 % (ref 0.0–1.5)
O2 Saturation: 62.3 %
Total hemoglobin: 11.3 g/dL — ABNORMAL LOW (ref 12.0–16.0)

## 2022-06-06 LAB — POCT I-STAT 7, (LYTES, BLD GAS, ICA,H+H)
Acid-Base Excess: 8 mmol/L — ABNORMAL HIGH (ref 0.0–2.0)
Bicarbonate: 31.1 mmol/L — ABNORMAL HIGH (ref 20.0–28.0)
Calcium, Ion: 0.91 mmol/L — ABNORMAL LOW (ref 1.15–1.40)
HCT: 33 % — ABNORMAL LOW (ref 39.0–52.0)
Hemoglobin: 11.2 g/dL — ABNORMAL LOW (ref 13.0–17.0)
O2 Saturation: 100 %
Patient temperature: 38
Potassium: 3.6 mmol/L (ref 3.5–5.1)
Sodium: 139 mmol/L (ref 135–145)
TCO2: 32 mmol/L (ref 22–32)
pCO2 arterial: 37.7 mmHg (ref 32–48)
pH, Arterial: 7.527 — ABNORMAL HIGH (ref 7.35–7.45)
pO2, Arterial: 229 mmHg — ABNORMAL HIGH (ref 83–108)

## 2022-06-06 LAB — ECHOCARDIOGRAM LIMITED
AV Mean grad: 42 mmHg
AV Peak grad: 77.4 mmHg
Ao pk vel: 4.4 m/s
Height: 70 in
P 1/2 time: 259 msec
Weight: 2892.44 oz

## 2022-06-06 LAB — GLUCOSE, CAPILLARY
Glucose-Capillary: 119 mg/dL — ABNORMAL HIGH (ref 70–99)
Glucose-Capillary: 121 mg/dL — ABNORMAL HIGH (ref 70–99)
Glucose-Capillary: 128 mg/dL — ABNORMAL HIGH (ref 70–99)
Glucose-Capillary: 143 mg/dL — ABNORMAL HIGH (ref 70–99)
Glucose-Capillary: 96 mg/dL (ref 70–99)
Glucose-Capillary: 98 mg/dL (ref 70–99)

## 2022-06-06 LAB — BASIC METABOLIC PANEL
Anion gap: 15 (ref 5–15)
Anion gap: 16 — ABNORMAL HIGH (ref 5–15)
BUN: 53 mg/dL — ABNORMAL HIGH (ref 8–23)
BUN: 58 mg/dL — ABNORMAL HIGH (ref 8–23)
CO2: 30 mmol/L (ref 22–32)
CO2: 30 mmol/L (ref 22–32)
Calcium: 7.5 mg/dL — ABNORMAL LOW (ref 8.9–10.3)
Calcium: 7.6 mg/dL — ABNORMAL LOW (ref 8.9–10.3)
Chloride: 95 mmol/L — ABNORMAL LOW (ref 98–111)
Chloride: 93 mmol/L — ABNORMAL LOW (ref 98–111)
Creatinine, Ser: 4.28 mg/dL — ABNORMAL HIGH (ref 0.61–1.24)
Creatinine, Ser: 4.63 mg/dL — ABNORMAL HIGH (ref 0.61–1.24)
GFR, Estimated: 13 mL/min — ABNORMAL LOW (ref >60)
GFR, Estimated: 12 mL/min — ABNORMAL LOW (ref >60)
Glucose, Bld: 155 mg/dL — ABNORMAL HIGH (ref 70–99)
Glucose, Bld: 126 mg/dL — ABNORMAL HIGH (ref 70–99)
Potassium: 3.6 mmol/L (ref 3.5–5.1)
Potassium: 3.8 mmol/L (ref 3.5–5.1)
Sodium: 140 mmol/L (ref 135–145)
Sodium: 139 mmol/L (ref 135–145)

## 2022-06-06 LAB — LEGIONELLA PNEUMOPHILA SEROGP 1 UR AG: L. pneumophila Serogp 1 Ur Ag: NEGATIVE

## 2022-06-06 LAB — PHOSPHORUS: Phosphorus: 7 mg/dL — ABNORMAL HIGH (ref 2.5–4.6)

## 2022-06-06 LAB — BRAIN NATRIURETIC PEPTIDE: B Natriuretic Peptide: 3856.6 pg/mL — ABNORMAL HIGH (ref 0.0–100.0)

## 2022-06-06 LAB — HEPARIN LEVEL (UNFRACTIONATED)
Heparin Unfractionated: 0.88 IU/mL — ABNORMAL HIGH (ref 0.30–0.70)
Heparin Unfractionated: 0.71 IU/mL — ABNORMAL HIGH (ref 0.30–0.70)

## 2022-06-06 LAB — PROTIME-INR
INR: 4.9 (ref 0.8–1.2)
Prothrombin Time: 45.2 seconds — ABNORMAL HIGH (ref 11.4–15.2)

## 2022-06-06 LAB — TRIGLYCERIDES: Triglycerides: 251 mg/dL — ABNORMAL HIGH (ref <150)

## 2022-06-06 LAB — LACTIC ACID, PLASMA: Lactic Acid, Venous: 3.1 mmol/L (ref 0.5–1.9)

## 2022-06-06 LAB — APTT
aPTT: 121 seconds — ABNORMAL HIGH (ref 24–36)
aPTT: 117 seconds — ABNORMAL HIGH (ref 24–36)

## 2022-06-06 MED ORDER — FUROSEMIDE 10 MG/ML IJ SOLN
80.0000 mg | Freq: Once | INTRAMUSCULAR | Status: AC
  Administered 2022-06-06: 80 mg via INTRAVENOUS
  Filled 2022-06-06: qty 8

## 2022-06-06 MED ORDER — POTASSIUM CHLORIDE 10 MEQ/50ML IV SOLN
10.0000 meq | INTRAVENOUS | Status: DC
  Administered 2022-06-06: 10 meq via INTRAVENOUS
  Filled 2022-06-06: qty 50

## 2022-06-06 MED ORDER — NOREPINEPHRINE 16 MG/250ML-% IV SOLN
0.0000 ug/min | INTRAVENOUS | Status: DC
  Administered 2022-06-06: 16 ug/min via INTRAVENOUS
  Administered 2022-06-07: 23 ug/min via INTRAVENOUS
  Administered 2022-06-07: 26 ug/min via INTRAVENOUS
  Administered 2022-06-07 – 2022-06-08 (×2): 30 ug/min via INTRAVENOUS
  Filled 2022-06-06 (×7): qty 250

## 2022-06-06 MED ORDER — FAMOTIDINE 20 MG PO TABS
20.0000 mg | ORAL_TABLET | Freq: Every day | ORAL | Status: DC
  Administered 2022-06-06 – 2022-06-07 (×2): 20 mg
  Filled 2022-06-06 (×2): qty 1

## 2022-06-06 MED ORDER — CALCIUM GLUCONATE-NACL 2-0.675 GM/100ML-% IV SOLN
2.0000 g | Freq: Once | INTRAVENOUS | Status: AC
  Administered 2022-06-06: 2000 mg via INTRAVENOUS
  Filled 2022-06-06: qty 100

## 2022-06-06 MED ORDER — FENTANYL 2500MCG IN NS 250ML (10MCG/ML) PREMIX INFUSION
0.0000 ug/h | INTRAVENOUS | Status: DC
  Administered 2022-06-06: 25 ug/h via INTRAVENOUS
  Administered 2022-06-07: 100 ug/h via INTRAVENOUS
  Filled 2022-06-06 (×3): qty 250

## 2022-06-06 MED ORDER — ACETAMINOPHEN 160 MG/5ML PO SOLN: 650.0000 mg | Freq: Four times a day (QID) | ORAL | Status: DC | PRN

## 2022-06-06 MED ORDER — FAMOTIDINE IN NACL 20-0.9 MG/50ML-% IV SOLN
20.0000 mg | INTRAVENOUS | Status: DC
  Filled 2022-06-06: qty 50

## 2022-06-06 MED FILL — Sodium Bicarbonate IV Soln 8.4%: INTRAVENOUS | Qty: 50 | Status: AC

## 2022-06-06 NOTE — Progress Notes (Signed)
PHARMACY - PHYSICIAN COMMUNICATION CRITICAL VALUE ALERT - BLOOD CULTURE IDENTIFICATION (BCID)  [name] is an 82 y.o. male who presented to [facility] on 05/29/2022 with a chief complaint of complete heart block  Assessment: 81 YOM with complete heart block on admission and concern for RLL PNA or UTI (self-caths) now with 2 of 4 blood cultures growing MSSE. Noted getting work-up for possible TAVR but currently no hx valve replacement or ICD. The patient does have multiple joint replacements (shoulder/knee/hip) so these should be evaluated. Could be considered a pathogen or contamination so will ask micro to run sensitivities on both sets (if both staph epi) to aid in evaluation of culture data.  Name of physician (or Provider) Contacted: CCM (Bowser/Chand)  Current antibiotics: Zosyn  Changes to prescribed antibiotics recommended:  None - Zosyn also covering for PNA/UTI and will pick up for this BCID isolate as well. F/u additional culture data and work-up  Results for orders placed or performed during the hospital encounter of 06/03/2022  Blood Culture ID Panel (Reflexed) (Collected: 06/19/2022  4:45 AM)  Result Value Ref Range   Enterococcus faecalis NOT DETECTED NOT DETECTED   Enterococcus Faecium NOT DETECTED NOT DETECTED   Listeria monocytogenes NOT DETECTED NOT DETECTED   Staphylococcus species DETECTED (A) NOT DETECTED   Staphylococcus aureus (BCID) NOT DETECTED NOT DETECTED   Staphylococcus epidermidis DETECTED (A) NOT DETECTED   Staphylococcus lugdunensis NOT DETECTED NOT DETECTED   Streptococcus species NOT DETECTED NOT DETECTED   Streptococcus agalactiae NOT DETECTED NOT DETECTED   Streptococcus pneumoniae NOT DETECTED NOT DETECTED   Streptococcus pyogenes NOT DETECTED NOT DETECTED   A.calcoaceticus-baumannii NOT DETECTED NOT DETECTED   Bacteroides fragilis NOT DETECTED NOT DETECTED   Enterobacterales NOT DETECTED NOT DETECTED   Enterobacter cloacae complex NOT DETECTED  NOT DETECTED   Escherichia coli NOT DETECTED NOT DETECTED   Klebsiella aerogenes NOT DETECTED NOT DETECTED   Klebsiella oxytoca NOT DETECTED NOT DETECTED   Klebsiella pneumoniae NOT DETECTED NOT DETECTED   Proteus species NOT DETECTED NOT DETECTED   Salmonella species NOT DETECTED NOT DETECTED   Serratia marcescens NOT DETECTED NOT DETECTED   Haemophilus influenzae NOT DETECTED NOT DETECTED   Neisseria meningitidis NOT DETECTED NOT DETECTED   Pseudomonas aeruginosa NOT DETECTED NOT DETECTED   Stenotrophomonas maltophilia NOT DETECTED NOT DETECTED   Candida albicans NOT DETECTED NOT DETECTED   Candida auris NOT DETECTED NOT DETECTED   Candida glabrata NOT DETECTED NOT DETECTED   Candida krusei NOT DETECTED NOT DETECTED   Candida parapsilosis NOT DETECTED NOT DETECTED   Candida tropicalis NOT DETECTED NOT DETECTED   Cryptococcus neoformans/gattii NOT DETECTED NOT DETECTED   Methicillin resistance mecA/C NOT DETECTED NOT DETECTED    Thank you for allowing pharmacy to be a part of this patient's care.  Alycia Rossetti, PharmD, BCPS Infectious Diseases Clinical Pharmacist 06/06/2022 10:36 AM   **Pharmacist phone directory can now be found on [email or web address] ([email or web address]).  Listed under [place].

## 2022-06-06 NOTE — Progress Notes (Signed)
Advanced Heart Failure Rounding Note  PCP-Cardiologist: Skeet Latch, MD   Subjective:     Remains intubated. On propofol  NE 5 epi 18 and bicarb   Will wake up and follow commands  TVP turned down -> sinus 70s  Scr up to 4.3  Anuric INR 4.9  LFTs up but not rechecked today     Objective:   Weight Range: 81.6 kg Body mass index is 25.83 kg/m.   Vital Signs:   Temp:  [95.9 F (35.5 C)-100 F (37.8 C)] 100 F (37.8 C) (01/18 0400) Pulse Rate:  [36-159] 80 (01/18 0400) Resp:  [15-54] 30 (01/18 0400) BP: (77-142)/(35-96) 130/52 (01/18 0400) SpO2:  [82 %-100 %] 100 % (01/18 0400) Arterial Line BP: (122-171)/(37-52) 171/45 (01/18 0400) FiO2 (%):  [60 %-100 %] 60 % (01/18 0400) Last BM Date : 06/06/2022  Weight change: Filed Weights   06/06/2022 0345  Weight: 81.6 kg    Intake/Output:   Intake/Output Summary (Last 24 hours) at 06/06/2022 0636 Last data filed at 06/06/2022 0300 Gross per 24 hour  Intake 3455.56 ml  Output 35 ml  Net 3420.56 ml      Physical Exam    General:  Elderly male on vent. Will arouse and follow some commands HEENT: Normal + ETT Neck: Supple. JVP to jaw . Carotids 2+ bilat; no bruits. No lymphadenopathy or thyromegaly appreciated. Cor:  Regular rate & rhythm.+ AS Lungs: Clear + R chest tube Abdomen: Soft, nontender, nondistended. No hepatosplenomegaly. No bruits or masses. Good bowel sounds. Extremities: No cyanosis, clubbing, rash, edema Neuro: intubated sedated will awaken and follow some commands   Telemetry   V paced 80 -> sinus 70s Personally reviewed   Labs    CBC Recent Labs    05/28/2022 0349 06/17/2022 0407 06/15/2022 1129 06/06/22 0401  WBC 17.9*  --   --  21.2*  HGB 11.4*   < > 11.6* 11.4*  HCT 35.8*   < > 34.0* 34.3*  MCV 97.8  --   --  92.2  PLT 119*  --   --  70*   < > = values in this interval not displayed.   Basic Metabolic Panel Recent Labs    06/17/2022 1105 06/15/2022 1129 06/06/22 0401  NA 146*  141 140  K 4.0 3.9 3.6  CL 99  --  95*  CO2 18*  --  30  GLUCOSE 141*  --  155*  BUN 45*  --  53*  CREATININE 3.83*  --  4.28*  CALCIUM 8.4*  --  7.5*  MG  --   --  2.4  PHOS  --   --  7.0*   Liver Function Tests Recent Labs    06/03/2022 0349 06/07/2022 1105  AST 170* 2,524*  ALT 80* 1,183*  ALKPHOS 28* 40  BILITOT 1.3* 1.8*  PROT 5.3* 4.7*  ALBUMIN 2.4* 2.0*   No results for input(s): "LIPASE", "AMYLASE" in the last 72 hours. Cardiac Enzymes No results for input(s): "CKTOTAL", "CKMB", "CKMBINDEX", "TROPONINI" in the last 72 hours.  BNP: BNP (last 3 results) Recent Labs    06/19/2022 0238 06/06/22 0401  BNP 1,545.5* 3,856.6*    ProBNP (last 3 results) No results for input(s): "PROBNP" in the last 8760 hours.   D-Dimer No results for input(s): "DDIMER" in the last 72 hours. Hemoglobin A1C Recent Labs    06/03/2022 0730  HGBA1C 5.8*   Fasting Lipid Panel Recent Labs    06/06/22 0401  TRIG  251*   Thyroid Function Tests No results for input(s): "TSH", "T4TOTAL", "T3FREE", "THYROIDAB" in the last 72 hours.  Invalid input(s): "FREET3"  Other results:   Imaging    DG CHEST PORT 1 VIEW  Result Date: 06/13/2022 CLINICAL DATA:  [id number] S/P chest tube placement [facility]) [id number] EXAM: PORTABLE CHEST 1 VIEW COMPARISON:  Radiograph 06/17/2022 FINDINGS: Endotracheal tube overlies the midthoracic trachea approximately 5.0 cm above the carina. Orogastric tube tip and side port overlie the stomach. Defibrillator pads overlie the chest. There is a left approach central venous catheter with tip overlying the mid SVC. Right neck catheter overlies the right atrium, obscured by a defibrillator pad. Unchanged cardiomediastinal silhouette. Interval right apical pigtail chest tube placement with significantly decreased right-sided pneumothorax. No visible residual pneumothorax identified. Expansion of the right lung with moderate distal airspace disease. Suspected trace effusions.  There are right upper rib fractures, involving ribs 4 and 5. Subcutaneous emphysema along the right chest wall. IMPRESSION: Decreased right pneumothorax after apical chest tube placement, without residual pneumothorax identified. Re-expansion of the right lung with interstitial and airspace opacities, which could represent re-expansion edema. Right lateral fourth and fifth rib fractures. Electronically Signed   By: Maurine Simmering M.D.   On: 06/11/2022 14:31   DG CHEST PORT 1 VIEW  Result Date: 06/13/2022 CLINICAL DATA:  [id number] Encounter for central line placement [id number] EXAM: PORTABLE CHEST 1 VIEW COMPARISON:  Chest XR, 05/28/2022.  CT chest, 04/15/2022. FINDINGS: Support lines: Intubation with well-positioned ETT and tip at the level of the clavicular heads. RIGHT IJ sheath with monitoring catheter tip within the RV. We with enteric decompression tube with tip and side port projecting inferior to the diaphragm and within the stomach. Numerous overlying cutaneous pacers and leads. Cardiomediastinal silhouette is within normal limits given technique and degree of inflation. The LEFT lung is well inflated. New, at least moderate volume RIGHT pneumothorax. No pleural effusion No acute displaced fracture. IMPRESSION: 1. New, at least moderate volume RIGHT pneumothorax. 2. Well-positioned ETT with catheter tip at the midthoracic trachea. Additional lines and tubes as above. These results were called by telephone at the time of interpretation on 05/28/2022 at 10:46 am to provider [facility] , who verbally acknowledged these results. Electronically Signed   By: Michaelle Birks M.D.   On: 05/21/2022 10:48   CARDIAC CATHETERIZATION  Result Date: 06/07/2022 1.  Successful temporary pacemaker placement from the right internal jugular site with threshold of 0.2 mA; the pacemaker was set to an output of 10 mA with a rate of 80 bpm. 2.  Successful radial arterial line placement. Recommendation: Due to pH is 7.14 with a  bicarbonate of 16, 3 A of bicarbonate were administered in the lab.  The patient will be transported to the [facility] unit for further evaluation and treatment.  The patient's family was updated on the procedure.     Medications:     Scheduled Medications:  Chlorhexidine Gluconate Cloth  6 each Topical Daily   docusate  100 mg Per Tube BID   fenofibrate  160 mg Per Tube Daily   fluticasone furoate-vilanterol  1 puff Inhalation Daily   insulin aspart  1-3 Units Subcutaneous Q4H   mouth rinse  15 mL Mouth Rinse Q2H   polyethylene glycol  17 g Per Tube Daily   sodium chloride flush  10-40 mL Intracatheter Q12H    Infusions:  sodium chloride     epinephrine 18 mcg/min (06/06/22 0630)   heparin 1,050 Units/hr (06/06/22 0340)  norepinephrine (LEVOPHED) Adult infusion 5 mcg/min (06/06/22 0551)   piperacillin-tazobactam (ZOSYN)  IV 2.25 g (06/09/2022 2137)   propofol (DIPRIVAN) infusion 30 mcg/kg/min (06/06/22 0300)   sodium bicarbonate 150 mEq in sterile water 1,150 mL infusion 100 mL/hr at 06/06/22 0300    PRN Medications: acetaminophen, fentaNYL (SUBLIMAZE) injection, fentaNYL (SUBLIMAZE) injection, mouth rinse, sodium chloride flush     Assessment/Plan   1. PEA/Bradycardic/VT arrest -Initially transcutaneously placed. Developed PEA arrest as well as episodes of VT for which he was defibrillated - s/p emergent temporary pacer  - now with rhythm recovery with clearing of acidosis. TVP turned to backup 60 bpm - following commands - discussed with family. Will continue aggressive care but will make DNR so no further CPR   2. Shock, likely cardiogenic +/- component of septic: -In setting of complete heart block followed by cardiac arrest requiring temporary pacer - Complicated by severe baseline AS - Echo 06/14/2022 EF 60-65% severe AS RV ok  -Started on empiric abx for possible PNA, developing infiltrate noted right mid lung -Remains on Epi 18 and NE 5 -> wean as tolerated -Lactic acid >  9.0 yesterday. Will recheck. Check co-ox   3. 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 No urgent indication unless acidosis requires it. Can consider today or tomorrow - With AS probably not good iHD candidate but could consider TAVR as needed   4. Severe aortic valve stenosis: -Echo 09/23: EF 60-65%, moderate LVH, RV okay, severe AS with mean gradient 37 mmhg, AVA 0.8 cm2 and DI 0.24 -Seen by structural team 11/23 - decided watchful waiting d/t lack of symptoms and preserved LV function but completed preTAVR workup - Will need TAVR if recovers   5. Acute respiratory failure with hypoxia -Intubated, vent management per CCM -On abx with azithromycin and ceftriaxone for possible PNA - Wean vent as tolerated   6. R pneumothorax due to CPR - now with chest tube  7. Shock liver -In setting of shock - trend LFTs - avoid hepatotoxic agents   8. 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 I did not find prior hx of Afib in record - Heparin gtt   9.  Neuro - Beginning to follow commands  CRITICAL CARE Performed by: Glori Bickers  Total critical care time: 55 minutes  Critical care time was exclusive of separately billable procedures and treating other patients.  Critical care was necessary to treat or prevent imminent or life-threatening deterioration.  Critical care was time spent personally by me (independent of midlevel providers or residents) on the following activities: development of treatment plan with patient and/or surrogate as well as nursing, discussions with consultants, evaluation of patient's response to treatment, examination of patient, obtaining history from patient or surrogate, ordering and performing treatments and interventions, ordering and review of laboratory studies, ordering and review of radiographic studies, pulse oximetry and  re-evaluation of patient's condition.   Length of Stay: 1  Glori Bickers, MD  06/06/2022, 6:36 AM  Advanced Heart Failure Team Pager [phone] (M-F; 7a - 5p)  Please contact [facility] for night-coverage after hours (5p -7a ) and weekends on [email or web address]

## 2022-06-06 NOTE — Progress Notes (Signed)
  Echocardiogram 2D Echocardiogram has been performed.  [name] 06/06/2022, 10:20 AM

## 2022-06-06 NOTE — Progress Notes (Signed)
ANTICOAGULATION CONSULT NOTE - Follow Up Consult  Pharmacy Consult for Heparin (Apixaban on hold) Indication:  History of PE  Allergies  Allergen Reactions   Cefaclor Other (See Comments)     skin split    Patient Measurements: Height: '5\' 10"'$  (177.8 cm) Weight: 82 kg (180 lb 12.4 oz) IBW/kg (Calculated) : 73  Vital Signs: Temp: 101.5 F (38.6 C) (01/18 1100) Temp Source: Bladder (01/18 0800) BP: 99/51 (01/18 1100) Pulse Rate: 64 (01/18 1100)  Labs: Recent Labs    05/20/2022 0349 06/11/2022 0407 06/06/2022 0629 06/12/2022 0752 05/30/2022 1105 06/02/2022 1129 06/06/22 0156 06/06/22 0401 06/06/22 0747 06/06/22 0753 06/06/22 1041  HGB 11.4* 12.6*  --    < >  --    < >  --  11.4* 11.6* 11.2*  --   HCT 35.8* 37.0*  --    < >  --    < >  --  34.3* 34.0* 33.0*  --   PLT 119*  --   --   --   --   --   --  70*  --   --   --   APTT 45*  --   --   --   --   --  121*  --   --   --   --   LABPROT 26.3*  --   --   --   --   --  45.2*  --   --   --   --   INR 2.4*  --   --   --   --   --  4.9*  --   --   --   --   HEPARINUNFRC  --   --   --   --   --   --  0.88*  --   --   --  0.71*  CREATININE 3.67* 3.60*  --   --  3.83*  --   --  4.28*  --   --   --   TROPONINIHS 253*  --  240*  --   --   --   --   --   --   --   --    < > = values in this interval not displayed.     Estimated Creatinine Clearance: 14.5 mL/min (A) (by C-G formula based on SCr of 4.28 mg/dL (H)).   Medical History: Past Medical History:  Diagnosis Date   Anxiety    reports scotch helps that    Aortic stenosis, mild 08/18/2018   Mean gradient 14 mmHg 04/2018   Arthritis    " it's everywhere"   Blood transfusion without reported diagnosis    Cataract    having cataract surgery July 2016   Chronic bronchitis ([place])    Chronic kidney disease    hx kidney stones   Colon polyps    Complication of anesthesia    after surgery 07-2013 unable to sleep for 3 days   COPD (chronic obstructive pulmonary disease) ([place])     Coronary artery calcification seen on CAT scan 08/18/2018   GERD (gastroesophageal reflux disease)    History of kidney stones    Hyperlipidemia    Hypertension    Prostate hypertrophy    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 On apixaban PTA for history of PE. Holding apixaban and starting heparin for now. Last dose of apixaban was 1/16 at 1400, ok to start heparin for now. Anticipate using aPTT to dose.   Heparin drip 1050 uts/hr with heparin level 0.7 at top of goal with stable cbc but oozing at IV site  Will decrease rate slightly   Goal of Therapy:  Heparin level 0.3-0.7 units/ml aPTT 66-102 secs Monitor platelets by anticoagulation protocol: Yes   Plan:  Decrease heparin drip 950 units/hr Daily CBC/Heparin level/aPTT Monitor for bleeding  Bonnita Nasuti Pharm.D. CPP, BCPS Clinical Pharmacist [phone] 06/06/2022 11:47 AM

## 2022-06-06 NOTE — Progress Notes (Addendum)
NAME:  [name], MRN:  [id number], DOB:  09-Jan-1941, LOS: 1 ADMISSION DATE:  05/28/2022 CONSULTATION DATE:  06/03/2022 REFERRING MD:  Christy Gentles - EDP CHIEF COMPLAINT:  CHB, hypotension, ?sepsis   History of Present Illness:  [name] 82 year old gentleman with a history of provoked PE, mild COPD, OSA, severe AS who presented with acute SOB and weakness that began last evening. He has a mild chronic cough that is unchanged. With EMS he was noted to have a heart rate in the 30s and hypotension, as low as 42/20. He was stared on epi and atropine in the field. He was TC paced in the ED with an appropriate response. Cardiology evaluated the patient at bedside and stopped TC pacing and felt that he was a poor candidate for TV pacing. CXR concerning for possible R pneumonia, and he was given fluids and antibiotics for sepsis after blood cultures were collected. He denies change in baseline cough. He has been compliant with his inhalers. He has increase SOB that began suddenly last night. He denies CP. Recently he has had a decreased appetite since treatment for a UTI. He complaints today of feeling thirsty.   Pertinent Medical History:   Past Medical History:  Diagnosis Date   Anxiety    reports scotch helps that    Aortic stenosis, mild 08/18/2018   Mean gradient 14 mmHg 04/2018   Arthritis    " it's everywhere"   Blood transfusion without reported diagnosis    Cataract    having cataract surgery July 2016   Chronic bronchitis ([place])    Chronic kidney disease    hx kidney stones   Colon polyps    Complication of anesthesia    after surgery 07-2013 unable to sleep for 3 days   COPD (chronic obstructive pulmonary disease) ([place])    Coronary artery calcification seen on CAT scan 08/18/2018   GERD (gastroesophageal reflux disease)    History of kidney stones    Hyperlipidemia    Hypertension    Prostate hypertrophy    Pulmonary embolism ([place]) 12/2014   post TJR   Seasonal allergies     Skin cancer    squamous cell, 1 melanoma left shoulder, one episode- under tongue, has had a total of 17-18 episodes    Sleep apnea    had surgery in 2000 for sleep apnea, no CPAP needed   Thoracic aortic aneurysm ([place]) 05/04/2019   4.6 cm 04/2019   Significant Hospital Events: Including procedures, antibiotic start and stop dates in addition to other pertinent events   1/17 admitted, cardiac arrest, started on epi infusion 1/18 awake and follwing commands. Dc bicarb gtt  Interim History / Subjective:  Waking up and following commands  Back up rate on pacer down to 60  Hr 70 on epi 18 NE 5  Coox 62   ABG this morning quite alkalotic   Objective:  Blood pressure (!) 130/52, pulse 80, temperature 100 F (37.8 C), temperature source Bladder, resp. rate (!) 30, height 5' 10"  (1.778 m), weight 82 kg, SpO2 100 %.    Vent Mode: PRVC FiO2 (%):  [60 %-100 %] 60 % Set Rate:  [22 bmp-35 bmp] 30 bmp Vt Set:  [500 mL-580 mL] 580 mL PEEP:  [5 cmH20-10 cmH20] 10 cmH20 Plateau Pressure:  [20 cmH20-24 cmH20] 21 cmH20   Intake/Output Summary (Last 24 hours) at 06/06/2022 0720 Last data filed at 06/06/2022 0300 Gross per 24 hour  Intake 3455.56 ml  Output 35 ml  Net 3420.56 ml   Filed Weights   05/21/2022 0345 06/06/22 0500  Weight: 81.6 kg 82 kg    Physical Examination:  General: Critically ill elderly M intubated anxious but NAD  HEENT:NCAT pink mm ETT secure  Neuro: Awake, alert, following commands  Pulm: Mechanically ventilated. Some crackles. Incr RR on PSV  Heart: rrr  Abdomen: soft ndnt  MSK: edematous, no acute joint deformity Skin: c/d/w    Resolved Hospital Problem List:    Assessment & Plan:   PEA arrest  -35-40 min downtime after arresting in ED -PEA -->  VT P -supportive care -off continuous sedation with promising neuro exam   Acute respiratory failure with hypoxia and hypercarbia  R ptx s/p CPR Possible aspiration PNA  P -follow chest tube -cobnt MV  support, wean as able. On 1/18 RR, FiO2 and PEEP weaned.  -VAP, pulm hygiene -zosyn   Cardiogenic shock  Complete heart block Severe AS Demand ischemia  -preserved LVEF, ok RV. 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  Urinary retention  -Cr up to 4.28 -had a chronic foley at home for a while, more recently I/Os at home  P -follow UOP closely  -worried may end up needing CRRT  -dc bicarb gtt 1/18 -keep foley  -will give 25m lasix this mornig  Shock liver Coagulopathy -follow LFTs PRN -follow coags   Lactic acidosis, improving Improved metabolic acidosis  P -dc bicarb gtt -tx underlying causes as below -follow LA PRN  -holding statin   Anemia  Thrombocytopenia -critical illness, chronic dz P -follow CBC   Hyponatremia, improved Hypocalcemia Borderline Hypokalemia  P -replace Ca K   GOC DNR status -Adv HF MD discussed code status and arrived at DNR in case of subsequent arrest, but continue full scope of medical tx otherwise -I verified this with them and will updated in epic    Best Practice: (right click and "Reselect all SmartList Selections" daily)   Diet/type: NPO DVT prophylaxis: SCD GI prophylaxis: H2B Lines: Central line, Arterial Line, and yes and it is still needed Foley:  Yes, and it is still needed Code Status:  DNR Last date of multidisciplinary goals of care discussion [1/18 sig other and son updated at bedside   Labs:  CBC: Recent Labs  Lab 06/11/2022 0349 06/10/2022 0407 05/28/2022 0752 06/02/2022 0852 05/24/2022 1129 06/06/22 0401  WBC 17.9*  --   --   --   --  21.2*  HGB 11.4* 12.6* 10.5* 11.2* 11.6* 11.4*  HCT 35.8*  37.0* 31.0* 33.0* 34.0* 34.3*  MCV 97.8  --   --   --   --  92.2  PLT 119*  --   --   --   --  70*   Basic Metabolic Panel: Recent Labs  Lab 06/14/2022 0349 05/26/2022 0407 06/02/2022 0752 06/14/2022 0852 05/24/2022 1105 06/04/2022 1129 06/06/22 0401  NA 133* 134* 140 140 146* 141 140  K 4.6 4.5 5.5* 4.6 4.0 3.9 3.6  CL 99 100  --   --  99  --  95*  CO2 19*  --   --   --  18*  --  30  GLUCOSE 165* 161*  --   --  141*  --  155*  BUN 45* 39*  --   --  45*  --  53*  CREATININE 3.67* 3.60*  --   --  3.83*  --  4.28*  CALCIUM 8.1*  --   --   --  8.4*  --  7.5*  MG  --   --   --   --   --   --  2.4  PHOS  --   --   --   --   --   --  7.0*   GFR: Estimated Creatinine Clearance: 14.5 mL/min (A) (by C-G formula based on SCr of 4.28 mg/dL (H)). Recent Labs  Lab 05/25/2022 0349 05/21/2022 0629 05/28/2022 1105 05/20/2022 1536 06/06/22 0401  WBC 17.9*  --   --   --  21.2*  LATICACIDVEN 6.3* 3.2* >9.0* >9.0*  --    Liver Function Tests: Recent Labs  Lab 05/24/2022 0349 06/18/2022 1105  AST 170* 2,524*  ALT 80* 1,183*  ALKPHOS 28* 40  BILITOT 1.3* 1.8*  PROT 5.3* 4.7*  ALBUMIN 2.4* 2.0*   No results for input(s): "LIPASE", "AMYLASE" in the last 168 hours. No results for input(s): "AMMONIA" in the last 168 hours.  ABG:    Component Value Date/Time   PHART 7.345 (L) 06/10/2022 1129   PCO2ART 32.5 05/29/2022 1129   PO2ART 90 06/13/2022 1129   HCO3 17.9 (L) 06/15/2022 1129   TCO2 19 (L) 06/11/2022 1129   ACIDBASEDEF 7.0 (H) 06/09/2022 1129   O2SAT 62.3 06/06/2022 0700    Coagulation Profile: Recent Labs  Lab 06/03/2022 0349 06/06/22 0156  INR 2.4* 4.9*   Cardiac Enzymes: No results for input(s): "CKTOTAL", "CKMB", "CKMBINDEX", "TROPONINI" in the last 168 hours.  HbA1C: Hgb A1c MFr Bld  Date/Time Value Ref Range Status  06/19/2022 07:30 AM 5.8 (H) 4.8 - 5.6 % Final    Comment:    (NOTE) Pre diabetes:          5.7%-6.4%  Diabetes:              >6.4%  Glycemic control for    <7.0% adults with diabetes     CBG: Recent Labs  Lab 06/06/2022 1127 06/04/2022 1540 06/11/2022 1955 06/17/2022 2336 06/06/22 0314  GLUCAP 161* 197* 202* 156* 143*    CRITICAL CARE Performed by: Cristal Generous   Total critical care time: 47 minutes  Critical care time was exclusive of separately billable procedures and treating other patients. Critical care was necessary to treat or prevent imminent or life-threatening deterioration.  Critical care was time spent personally by me on the following activities: development of treatment plan with patient and/or surrogate as well as nursing, discussions with consultants, evaluation of patient's response to treatment, examination of patient, obtaining history from patient or surrogate, ordering and performing treatments and interventions, ordering and review of laboratory studies, ordering and review of radiographic studies, pulse oximetry and re-evaluation of patient's condition.  Eliseo Gum MSN, AGACNP-BC [place] for pager  06/06/2022, 9:24 AM

## 2022-06-06 NOTE — Consult Note (Signed)
Nephrology Consult   Requesting provider: Jacky Kindle Service requesting consult: CCM Reason for consult: AKI on CKD 3b   Assessment/Recommendations: [name] is a/an 82 y.o. male with a past medical history of PE, OSA, severe AS, CKD 3b, HTN who present w/ complete heart block c/b PEA arrest and now AKI  Anuric AKI on CKD 3B: Likely secondary to ATN.  No immediate need for dialysis but given the patient's trajectory I think he will likely need renal replacement therapy in the next 24 hours -Continue to monitor for needs of dialysis closely; would be using CRRT -Has been discussed with the family by the cardiology team and the patient's family is open to it despite the patient being DNR right now.  Not sure if long-term dialysis would be within goals of care -Continue to monitor daily Cr, Dose meds for GFR -Monitor Daily I/Os, Daily weight  -Maintain MAP>65 for optimal renal perfusion.  -Avoid nephrotoxic medications including NSAIDs -Use synthetic opioids (Fentanyl/Dilaudid) if needed  Bradycardia/Cardiac arrest: had PEA arrest then VT after bradycardia. Not requiring pacing as of this time. Now DNR  Cardiogenic Shock: Septic component.  Has severe aortic stenosis at baseline.  Continue epinephrine and norepinephrine per primary team.  Aortic stenosis: Consideration of TAVR if the patient recovers but likely would want to see significant recovering kidney function first  Acute hypoxic respiratory failure: Intubated and sedated.  Management per primary team  Community-acquired pneumonia: Concern for such based on fevers and x-ray.  Antibiotics per primary team  Sided pneumothorax: Secondary to CPR.  Chest tube in place  Shock liver: Monitoring labs  History of pulmonary embolism: On Eliquis     Recommendations conveyed to primary service.    [facility] 06/06/2022 2:04  PM   _____________________________________________________________________________________ CC: SOB, AKI  History of Present Illness: [name] is a/an 82 y.o. male with a past medical history of PE, OSA, severe AS, CKD 3b, HTN who present w/ SOB  Patient was intubated so history was obtained per chart review.  The patient initially presented early yesterday morning for shortness of breath.  He had been having shortness of breath and weakness since the evening before arrival.  When EMS came he was noted to have a heart rate in the 30s as well as hypotension.  He was started on epinephrine and atropine and then had transcutaneous pacing.  Chest x-ray was also concerning for potential pneumonia and he was started on antibiotics.  After admission the patient experienced cardiac arrest and efforts were successful in achieving ROSC.  The patient did ultimately get transvenous pacing which has now been stopped because the patient went back into sinus rhythm.  He has been requiring epinephrine as well as norepinephrine.  The patient has been ventilated.  The patient has been making very little urine. Creatinine is around 1.6-1.8.  Creatinine was 3.6 on admission and increased to 4.6 today.   Medications:  Current Facility-Administered Medications  Medication Dose Route Frequency Provider Last Rate Last Admin   0.9 %  sodium chloride infusion  250 mL Intravenous Continuous Ripley Fraise, MD 10 mL/hr at 06/06/22 0651 250 mL at 06/06/22 0651   acetaminophen (TYLENOL) 160 MG/5ML solution 650 mg  650 mg Per Tube Q6H PRN Jacky Kindle, MD       Chlorhexidine Gluconate Cloth 2 % PADS 6 each  6 each Topical Daily Jacky Kindle, MD   6 each at 05/26/2022 1212   docusate (COLACE) 50 MG/5ML  liquid 100 mg  100 mg Per Tube BID Erick Colace, NP   100 mg at 06/06/22 0930   EPINEPHrine (ADRENALIN) 5 mg in NS 250 mL (0.02 mg/mL) premix infusion  0.5-20 mcg/min Intravenous Titrated Jacky Kindle, MD 36 mL/hr at  06/06/22 1346 12 mcg/min at 06/06/22 1346   famotidine (PEPCID) tablet 20 mg  20 mg Per Tube QHS Jacky Kindle, MD       fenofibrate tablet 160 mg  160 mg Per Tube Daily Kris Mouton, RPH   160 mg at 06/06/22 0930   fentaNYL (SUBLIMAZE) injection 25 mcg  25 mcg Intravenous Q15 min PRN Erick Colace, NP       fentaNYL (SUBLIMAZE) injection 25-100 mcg  25-100 mcg Intravenous Q30 min PRN Erick Colace, NP   100 mcg at 06/06/22 1320   fluticasone furoate-vilanterol (BREO ELLIPTA) 100-25 MCG/ACT 1 puff  1 puff Inhalation Daily [place] Crane M, PA-C       heparin ADULT infusion 100 units/mL (25000 units/235m)  950 Units/hr Intravenous Continuous CJacky Kindle MD 9.5 mL/hr at 06/06/22 1138 950 Units/hr at 06/06/22 1138   insulin aspart (novoLOG) injection 1-3 Units  1-3 Units Subcutaneous Q4H CNoemi ChapelP, DO   1 Units at 06/06/22 1158   norepinephrine (LEVOPHED) 16 mg in 2554m(0.064 mg/mL) premix infusion  0-40 mcg/min Intravenous Titrated ChJacky KindleMD 15 mL/hr at 06/06/22 1215 16 mcg/min at 06/06/22 1215   Oral care mouth rinse  15 mL Mouth Rinse Q2H ChJacky KindleMD   15 mL at 06/06/22 1203   Oral care mouth rinse  15 mL Mouth Rinse PRN ChJacky KindleMD       piperacillin-tazobactam (ZOSYN) IVPB 2.25 g  2.25 g Intravenous Q8H ChJacky KindleMD 100 mL/hr at 06/06/22 0652 2.25 g at 06/06/22 068101 polyethylene glycol (MIRALAX / GLYCOLAX) packet 17 g  17 g Per Tube Daily BaErick ColaceNP   17 g at 06/06/22 0930   propofol (DIPRIVAN) 1000 MG/100ML infusion  0-50 mcg/kg/min Intravenous Continuous BaErick ColaceNP 9.79 mL/hr at 06/06/22 1321 20 mcg/kg/min at 06/06/22 1321   sodium chloride flush (NS) 0.9 % injection 10-40 mL  10-40 mL Intracatheter Q12H ChJacky KindleMD   10 mL at 06/06/22 0901   sodium chloride flush (NS) 0.9 % injection 10-40 mL  10-40 mL Intracatheter PRN ChJacky KindleMD         ALLERGIES Cefaclor  MEDICAL HISTORY Past Medical History:  Diagnosis  Date   Anxiety    reports scotch helps that    Aortic stenosis, mild 08/18/2018   Mean gradient 14 mmHg 04/2018   Arthritis    " it's everywhere"   Blood transfusion without reported diagnosis    Cataract    having cataract surgery July 2016   [place]ronic [place] ([place]   Chronic kidney disease    hx kidney stones   Colon polyps    Complication of anesthesia    after surgery 07-2013 unable to sleep for 3 days   COPD (ch[place] obstructive pulmonary disease) ([place]   Coronary artery calcification seen on CAT scan 08/18/2018   GERD (gastroesophageal reflux disease)    History of kidney stones    Hyperlipidemia    Hypertension    Prostate hypertrophy    [place]ulmonary embolism [place]   post TJR   Seasonal allergies    Skin cancer    squamous cell, 1 melanoma left shoulder, one episode- under tongue,  has had a total of 17-18 episodes    Sleep apnea    had surgery in 2000 for sleep apnea, no CPAP needed   Thoracic aortic aneurysm ([place]) 05/04/2019   4.6 cm 04/2019     SOCIAL HISTORY Social History   Socioeconomic History   Marital status: Divorced    Spouse name: Not on file   Number of children: 2   Years of education: Not on file   Highest education level: Not on file  Occupational History   Occupation: real Government social research officer: [employer]  Tobacco Use   Smoking status: Light Smoker    Types: Cigars   Smokeless tobacco: Never   Tobacco comments:    pt states he smokes a couple of cigars per day. Never smoke cigarettes.  1 in the afternoon with an adult beverage and one after a meal.  Vaping Use   Vaping Use: Never used  Substance and Sexual Activity   Alcohol use: Yes    Alcohol/week: 21.0 standard drinks of alcohol    Types: 7 Glasses of wine, 14 Shots of liquor per week    Comment: weekly   Drug use: No   Sexual activity: Not on file  Other Topics Concern   Not on file  Social History Narrative   Not on file   Social Determinants of  Health   Financial Resource Strain: Not on file  Food Insecurity: Not on file  Transportation Needs: Not on file  Physical Activity: Not on file  Stress: Not on file  Social Connections: Not on file  Intimate Partner Violence: Not on file     FAMILY HISTORY Family History  Problem Relation Age of Onset   Diverticulosis Mother    Skin cancer Mother    Varicose Veins Mother    Heart attack Mother    Hyperlipidemia Father    Heart disease Father    Heart attack Father    Colon cancer Neg Hx    Esophageal cancer Neg Hx    Rectal cancer Neg Hx    Stomach cancer Neg Hx       Review of Systems: Unable to obtain due to the patient's sedation  Physical Exam: Vitals:   06/06/22 1100 06/06/22 1200  BP: (!) 99/51 (!) 108/46  Pulse: 64 64  Resp: 20 16  Temp: (S) (!) 101.5 F (38.6 C) (!) 101.3 F (38.5 C)  SpO2: 100% 100%   Total I/O In: 1455.1 [I.V.:1314.9; NG/GT:30; IV Piggyback:110.2] Out: 70 [Urine:40; Chest Tube:30]  Intake/Output Summary (Last 24 hours) at 06/06/2022 1404 Last data filed at 06/06/2022 1100 Gross per 24 hour  Intake 4005.73 ml  Output 70 ml  Net 3935.73 ml   General: Ill-appearing, lying in bed, no distress HEENT: anicteric sclera, oropharynx clear without lesions, et tube in place CV: Normal rate, normal rhythm, systolic murmur present Lungs: Coarse bilateral breath sounds, bilateral chest rise, right chest tube in place Abd: soft, non-tender, non-distended Skin: no visible lesions or rashes Psych: Wakes up but unable to answer questions due to sedation Musculoskeletal: no obvious deformities Neuro: Sedated with minimal interaction, does wake up and follow some commands  Test Results Reviewed Lab Results  Component Value Date   NA 139 06/06/2022   K 3.8 06/06/2022   CL 93 (L) 06/06/2022   CO2 30 06/06/2022   BUN 58 (H) 06/06/2022   CREATININE 4.63 (H) 06/06/2022   GFR 40.96 (L) 04/02/2022   CALCIUM 7.6 (L) 06/06/2022  ALBUMIN 2.0 (L)  06/06/2022   PHOS 7.0 (H) 06/06/2022    CBC Recent Labs  Lab 05/29/2022 0349 05/20/2022 0407 06/06/22 0401 06/06/22 0747 06/06/22 0753  WBC 17.9*  --  21.2*  --   --   HGB 11.4*   < > 11.4* 11.6* 11.2*  HCT 35.8*   < > 34.3* 34.0* 33.0*  MCV 97.8  --  92.2  --   --   PLT 119*  --  70*  --   --    < > = values in this interval not displayed.    I have reviewed all relevant outside healthcare records related to the patient's current hospitalization

## 2022-06-06 NOTE — Progress Notes (Signed)
eLink Physician-Brief Progress Note Patient Name: [name] DOB: [birth year] MRN: [id number]   Date of Service  06/06/2022  HPI/Events of Note  Patient with sub-optimal sedation on the ventilator on Propofol gtt + PRN iv Fentanyl boluses.  eICU Interventions  Fentanyl gtt ordered.        [name] [name] 06/06/2022, 9:00 PM

## 2022-06-06 NOTE — Progress Notes (Signed)
ANTICOAGULATION CONSULT NOTE - Follow Up Consult  Pharmacy Consult for heparin Indication:  h/o PE  Labs: Recent Labs    06/10/2022 0349 06/13/2022 0407 06/09/2022 0629 06/09/2022 0752 06/06/2022 0852 05/28/2022 1105 06/02/2022 1129 06/06/22 0156  HGB 11.4* 12.6*  --  10.5* 11.2*  --  11.6*  --   HCT 35.8* 37.0*  --  31.0* 33.0*  --  34.0*  --   PLT 119*  --   --   --   --   --   --   --   APTT 45*  --   --   --   --   --   --  121*  LABPROT 26.3*  --   --   --   --   --   --  45.2*  INR 2.4*  --   --   --   --   --   --  4.9*  HEPARINUNFRC  --   --   --   --   --   --   --  0.88*  CREATININE 3.67* 3.60*  --   --   --  3.83*  --   --   TROPONINIHS 253*  --  240*  --   --   --   --   --     Assessment: 81yo male supratherapeutic on heparin with initial dosing for ; no infusion issues or signs of bleeding per RN.  Goal of Therapy:  Heparin level 0.3-0.7 units/ml aPTT 66-102 seconds   Plan:  Will decrease heparin infusion by 2 units/kg/hr to 1050 units/hr and check level in 8 hours.    Wynona Neat, PharmD, BCPS  06/06/2022,3:39 AM

## 2022-06-07 ENCOUNTER — Inpatient Hospital Stay (HOSPITAL_COMMUNITY): Payer: Medicare Other

## 2022-06-07 DIAGNOSIS — A419 Sepsis, unspecified organism: Secondary | ICD-10-CM

## 2022-06-07 DIAGNOSIS — N179 Acute kidney failure, unspecified: Secondary | ICD-10-CM | POA: Diagnosis not present

## 2022-06-07 DIAGNOSIS — I459 Conduction disorder, unspecified: Secondary | ICD-10-CM | POA: Diagnosis not present

## 2022-06-07 DIAGNOSIS — I469 Cardiac arrest, cause unspecified: Secondary | ICD-10-CM | POA: Diagnosis not present

## 2022-06-07 DIAGNOSIS — N170 Acute kidney failure with tubular necrosis: Secondary | ICD-10-CM

## 2022-06-07 DIAGNOSIS — R6521 Severe sepsis with septic shock: Secondary | ICD-10-CM

## 2022-06-07 DIAGNOSIS — J189 Pneumonia, unspecified organism: Secondary | ICD-10-CM | POA: Diagnosis not present

## 2022-06-07 DIAGNOSIS — I442 Atrioventricular block, complete: Secondary | ICD-10-CM | POA: Diagnosis not present

## 2022-06-07 DIAGNOSIS — D689 Coagulation defect, unspecified: Secondary | ICD-10-CM

## 2022-06-07 LAB — BRAIN NATRIURETIC PEPTIDE: B Natriuretic Peptide: 4124 pg/mL — ABNORMAL HIGH (ref 0.0–100.0)

## 2022-06-07 LAB — CBC
HCT: 32.5 % — ABNORMAL LOW (ref 39.0–52.0)
Hemoglobin: 10.8 g/dL — ABNORMAL LOW (ref 13.0–17.0)
MCH: 31.3 pg (ref 26.0–34.0)
MCHC: 33.2 g/dL (ref 30.0–36.0)
MCV: 94.2 fL (ref 80.0–100.0)
Platelets: 72 10*3/uL — ABNORMAL LOW (ref 150–400)
RBC: 3.45 MIL/uL — ABNORMAL LOW (ref 4.22–5.81)
RDW: 14.8 % (ref 11.5–15.5)
WBC: 21 10*3/uL — ABNORMAL HIGH (ref 4.0–10.5)
nRBC: 0.6 % — ABNORMAL HIGH (ref 0.0–0.2)

## 2022-06-07 LAB — BASIC METABOLIC PANEL
Anion gap: 18 — ABNORMAL HIGH (ref 5–15)
BUN: 65 mg/dL — ABNORMAL HIGH (ref 8–23)
CO2: 26 mmol/L (ref 22–32)
Calcium: 7.1 mg/dL — ABNORMAL LOW (ref 8.9–10.3)
Chloride: 97 mmol/L — ABNORMAL LOW (ref 98–111)
Creatinine, Ser: 5.39 mg/dL — ABNORMAL HIGH (ref 0.61–1.24)
GFR, Estimated: 10 mL/min — ABNORMAL LOW (ref >60)
Glucose, Bld: 119 mg/dL — ABNORMAL HIGH (ref 70–99)
Potassium: 4.2 mmol/L (ref 3.5–5.1)
Sodium: 141 mmol/L (ref 135–145)

## 2022-06-07 LAB — GLUCOSE, CAPILLARY
Glucose-Capillary: 96 mg/dL (ref 70–99)
Glucose-Capillary: 115 mg/dL — ABNORMAL HIGH (ref 70–99)
Glucose-Capillary: 134 mg/dL — ABNORMAL HIGH (ref 70–99)
Glucose-Capillary: 143 mg/dL — ABNORMAL HIGH (ref 70–99)
Glucose-Capillary: 121 mg/dL — ABNORMAL HIGH (ref 70–99)

## 2022-06-07 LAB — COOXEMETRY PANEL
Carboxyhemoglobin: 1.6 % — ABNORMAL HIGH (ref 0.5–1.5)
Methemoglobin: <0.7 % (ref 0.0–1.5)
O2 Saturation: 78.4 %
Total hemoglobin: 10.7 g/dL — ABNORMAL LOW (ref 12.0–16.0)

## 2022-06-07 LAB — LACTIC ACID, PLASMA: Lactic Acid, Venous: 1.4 mmol/L (ref 0.5–1.9)

## 2022-06-07 LAB — RENAL FUNCTION PANEL
Albumin: 1.9 g/dL — ABNORMAL LOW (ref 3.5–5.0)
Anion gap: 14 (ref 5–15)
BUN: 58 mg/dL — ABNORMAL HIGH (ref 8–23)
CO2: 25 mmol/L (ref 22–32)
Calcium: 6.7 mg/dL — ABNORMAL LOW (ref 8.9–10.3)
Chloride: 100 mmol/L (ref 98–111)
Creatinine, Ser: 4.15 mg/dL — ABNORMAL HIGH (ref 0.61–1.24)
GFR, Estimated: 14 mL/min — ABNORMAL LOW (ref >60)
Glucose, Bld: 128 mg/dL — ABNORMAL HIGH (ref 70–99)
Phosphorus: 6.7 mg/dL — ABNORMAL HIGH (ref 2.5–4.6)
Potassium: 4.4 mmol/L (ref 3.5–5.1)
Sodium: 139 mmol/L (ref 135–145)

## 2022-06-07 LAB — HEPATIC FUNCTION PANEL
ALT: 1229 U/L — ABNORMAL HIGH (ref 0–44)
AST: 2099 U/L — ABNORMAL HIGH (ref 15–41)
Albumin: 1.9 g/dL — ABNORMAL LOW (ref 3.5–5.0)
Alkaline Phosphatase: 49 U/L (ref 38–126)
Bilirubin, Direct: 3.4 mg/dL — ABNORMAL HIGH (ref 0.0–0.2)
Indirect Bilirubin: 1.2 mg/dL — ABNORMAL HIGH (ref 0.3–0.9)
Total Bilirubin: 4.6 mg/dL — ABNORMAL HIGH (ref 0.3–1.2)
Total Protein: 4.5 g/dL — ABNORMAL LOW (ref 6.5–8.1)

## 2022-06-07 LAB — APTT: aPTT: 160 seconds — ABNORMAL HIGH (ref 24–36)

## 2022-06-07 LAB — HEPARIN LEVEL (UNFRACTIONATED): Heparin Unfractionated: 0.55 IU/mL (ref 0.30–0.70)

## 2022-06-07 LAB — TRIGLYCERIDES: Triglycerides: 441 mg/dL — ABNORMAL HIGH (ref <150)

## 2022-06-07 LAB — PROTIME-INR
INR: 3.1 — ABNORMAL HIGH (ref 0.8–1.2)
Prothrombin Time: 31.6 seconds — ABNORMAL HIGH (ref 11.4–15.2)

## 2022-06-07 MED ORDER — PROSOURCE TF20 ENFIT COMPATIBL EN LIQD
60.0000 mL | ENTERAL | Status: DC
  Administered 2022-06-07 – 2022-06-08 (×3): 60 mL
  Filled 2022-06-07 (×3): qty 60

## 2022-06-07 MED ORDER — PRISMASOL BGK 4/2.5 32-4-2.5 MEQ/L REPLACEMENT SOLN: Status: DC

## 2022-06-07 MED ORDER — RENA-VITE PO TABS
1.0000 | ORAL_TABLET | Freq: Every day | ORAL | Status: DC
  Administered 2022-06-07: 1
  Filled 2022-06-07: qty 1

## 2022-06-07 MED ORDER — VASOPRESSIN 20 UNITS/100 ML INFUSION FOR SHOCK
0.0000 [IU]/min | INTRAVENOUS | Status: DC
  Administered 2022-06-07 – 2022-06-08 (×3): 0.03 [IU]/min via INTRAVENOUS
  Filled 2022-06-07 (×4): qty 100

## 2022-06-07 MED ORDER — SODIUM CHLORIDE 0.9 % FOR CRRT: INTRAVENOUS_CENTRAL | Status: DC | PRN

## 2022-06-07 MED ORDER — VITAL 1.5 CAL PO LIQD
1000.0000 mL | ORAL | Status: DC
  Administered 2022-06-07: 1000 mL

## 2022-06-07 MED ORDER — SODIUM CHLORIDE 0.9 % IV SOLN
1.0000 g | Freq: Two times a day (BID) | INTRAVENOUS | Status: DC
  Administered 2022-06-07: 1 g via INTRAVENOUS
  Filled 2022-06-07: qty 20

## 2022-06-07 MED ORDER — HEPARIN SODIUM (PORCINE) 1000 UNIT/ML DIALYSIS
1000.0000 [IU] | INTRAMUSCULAR | Status: DC | PRN
  Filled 2022-06-07: qty 4

## 2022-06-07 MED ORDER — CLONAZEPAM 1 MG PO TABS
1.0000 mg | ORAL_TABLET | Freq: Three times a day (TID) | ORAL | Status: DC | PRN
  Administered 2022-06-07: 1 mg
  Filled 2022-06-07 (×2): qty 1

## 2022-06-07 MED ORDER — MIDAZOLAM-SODIUM CHLORIDE 100-0.9 MG/100ML-% IV SOLN
0.5000 mg/h | INTRAVENOUS | Status: DC
  Administered 2022-06-07: 0.5 mg/h via INTRAVENOUS
  Administered 2022-06-08: 6 mg/h via INTRAVENOUS
  Filled 2022-06-07 (×2): qty 100

## 2022-06-07 MED ORDER — OXYCODONE HCL 5 MG PO TABS
5.0000 mg | ORAL_TABLET | Freq: Four times a day (QID) | ORAL | Status: DC | PRN
  Administered 2022-06-07: 5 mg via ORAL
  Filled 2022-06-07: qty 1

## 2022-06-07 MED ORDER — SODIUM CHLORIDE 0.9 % IV SOLN
1.0000 g | Freq: Three times a day (TID) | INTRAVENOUS | Status: DC
  Administered 2022-06-07 – 2022-06-08 (×2): 1 g via INTRAVENOUS
  Filled 2022-06-07 (×2): qty 20

## 2022-06-07 MED ORDER — CLONAZEPAM 0.1 MG/ML ORAL SUSPENSION: 1.0000 mg | Freq: Three times a day (TID) | ORAL | Status: DC | PRN

## 2022-06-07 MED ORDER — VITAL HIGH PROTEIN PO LIQD: 1000.0000 mL | ORAL | Status: DC

## 2022-06-07 MED ORDER — PRISMASOL BGK 4/2.5 32-4-2.5 MEQ/L EC SOLN: Status: DC

## 2022-06-07 NOTE — Progress Notes (Addendum)
ANTICOAGULATION CONSULT NOTE - Follow Up Consult  Pharmacy Consult for Heparin (Apixaban on hold) Indication:  History of PE  Allergies  Allergen Reactions   Cefaclor Other (See Comments)     skin split    Patient Measurements: Height: '5\' 10"'$  (177.8 cm) Weight: 87 kg (191 lb 12.8 oz) IBW/kg (Calculated) : 73  Vital Signs: Temp: 100.4 F (38 C) (01/19 0745) Temp Source: Bladder (01/19 0445) BP: 101/39 (01/19 0700) Pulse Rate: 70 (01/19 0745)  Labs: Recent Labs    06/06/2022 0349 05/27/2022 0407 05/21/2022 0629 06/12/2022 0752 06/06/22 0156 06/06/22 0401 06/06/22 0747 06/06/22 0753 06/06/22 1041 06/07/22 0440  HGB 11.4*   < >  --    < >  --  11.4* 11.6* 11.2*  --  10.8*  HCT 35.8*   < >  --    < >  --  34.3* 34.0* 33.0*  --  32.5*  PLT 119*  --   --   --   --  70*  --   --   --  72*  APTT 45*  --   --   --  121*  --   --   --  117* 160*  LABPROT 26.3*  --   --   --  45.2*  --   --   --   --  31.6*  INR 2.4*  --   --   --  4.9*  --   --   --   --  3.1*  HEPARINUNFRC  --   --   --   --  0.88*  --   --   --  0.71* 0.55  CREATININE 3.67*   < >  --    < >  --  4.28*  --   --  4.63* 5.39*  TROPONINIHS 253*  --  240*  --   --   --   --   --   --   --    < > = values in this interval not displayed.     Estimated Creatinine Clearance: 11.5 mL/min (A) (by C-G formula based on SCr of 5.39 mg/dL (H)).   Medical History: Past Medical History:  Diagnosis Date   Anxiety    reports scotch helps that    Aortic stenosis, mild 08/18/2018   Mean gradient 14 mmHg 04/2018   Arthritis    " it's everywhere"   Blood transfusion without reported diagnosis    Cataract    having cataract surgery July 2016   Chronic bronchitis ([place])    Chronic kidney disease    hx kidney stones   Colon polyps    Complication of anesthesia    after surgery 07-2013 unable to sleep for 3 days   COPD (chronic obstructive pulmonary disease) ([place])    Coronary artery calcification seen on CAT scan 08/18/2018    GERD (gastroesophageal reflux disease)    History of kidney stones    Hyperlipidemia    Hypertension    Prostate hypertrophy    Pulmonary embolism ([place]) 12/2014   post TJR   Seasonal allergies    Skin cancer    squamous cell, 1 melanoma left shoulder, one episode- under tongue, has had a total of 17-18 episodes    Sleep apnea    had surgery in 2000 for sleep apnea, no CPAP needed   Thoracic aortic aneurysm ([place]) 05/04/2019   4.6 cm 04/2019     Assessment: 82 y/o M in the ED with bradycardia.  On apixaban PTA for history of PE. Holding apixaban and starting heparin for now. Last dose of apixaban was 1/16 at 1400, ok to start heparin for now. Anticipate using aPTT to dose.   Heparin level came back therapeutic at 0.55 (aPTT came back at 160 - will disregard). INR is 3.1. Hgb 10.8, plt 72. Plan for CRRT start today.  Goal of Therapy:  Heparin level 0.3-0.7 units/ml Monitor platelets by anticoagulation protocol: Yes   Plan:  Continue heparin infusion at 950 units/hr Daily CBC/Heparin level Monitor for bleeding  Antonietta Jewel, PharmD, [place] Pharmacist  Phone: [phone] 06/07/2022 8:09 AM  Please check AMION for all [place] phone numbers After 10:00 PM, call [place] [phone]

## 2022-06-07 NOTE — Progress Notes (Signed)
eLink Physician-Brief Progress Note Patient Name: [name] DOB: 31-Oct-1940 MRN: [id number]   Date of Service  06/07/2022  HPI/Events of Note  Propofol discontinued due to hypertriglyceridemia, Precedex relatively contraindicated secondary to advanced heart block history / brady-arrest history.  eICU Interventions  Versed gtt ordered.        Frederik Pear 06/07/2022, 10:41 PM

## 2022-06-07 NOTE — Plan of Care (Signed)
  Problem: Clinical Measurements: Goal: Diagnostic test results will improve Outcome: Progressing Goal: Respiratory complications will improve Outcome: Progressing   Problem: Elimination: Goal: Will not experience complications related to urinary retention Outcome: Progressing   Problem: Pain Managment: Goal: General experience of comfort will improve Outcome: Progressing   Problem: Safety: Goal: Ability to remain free from injury will improve Outcome: Progressing   Problem: Skin Integrity: Goal: Risk for impaired skin integrity will decrease Outcome: Progressing   Problem: Clinical Measurements: Goal: Will remain free from infection Outcome: Not Progressing Goal: Cardiovascular complication will be avoided Outcome: Not Progressing   Problem: Activity: Goal: Risk for activity intolerance will decrease Outcome: Not Progressing   Problem: Nutrition: Goal: Adequate nutrition will be maintained Outcome: Not Progressing

## 2022-06-07 NOTE — Progress Notes (Addendum)
Advanced Heart Failure Rounding Note  PCP-Cardiologist: Skeet Latch, MD   Subjective:   1/18 Nephrology consulted.   Remains intubated.   Overnight pressors increased.   NE 27 epi 18 Vaso 0.03   Scr trending up 4.3  -->5.4 . Minimal urine output.  WBC 21>21    Objective:   Weight Range: 87 kg Body mass index is 27.52 kg/m.   Vital Signs:   Temp:  [98.8 F (37.1 C)-101.5 F (38.6 C)] 100.4 F (38 C) (01/19 0745) Pulse Rate:  [60-134] 70 (01/19 0745) Resp:  [15-32] 21 (01/19 0745) BP: (77-122)/(36-93) 101/39 (01/19 0700) SpO2:  [99 %-100 %] 100 % (01/19 0745) Arterial Line BP: (92-161)/(26-56) 108/29 (01/19 0745) FiO2 (%):  [40 %] 40 % (01/19 0400) Weight:  [87 kg] 87 kg (01/19 0428) Last BM Date : 05/30/2022  Weight change: Filed Weights   06/15/2022 0345 06/06/22 0500 06/07/22 0428  Weight: 81.6 kg 82 kg 87 kg    Intake/Output:   Intake/Output Summary (Last 24 hours) at 06/07/2022 0804 Last data filed at 06/07/2022 0700 Gross per 24 hour  Intake 2152.13 ml  Output 851 ml  Net 1301.13 ml    CVP   Physical Exam    General:  Intubated HEENT: ETT Neck: supple. JVP 6-7. Carotids 2+ bilat; no bruits. No lymphadenopathy or thryomegaly appreciated. Cor: PMI nondisplaced. Regular rate & rhythm. No rubs, gallops or murmurs. Lungs: clear Abdomen: soft, nontender, nondistended. No hepatosplenomegaly. No bruits or masses. Good bowel sounds. Extremities: no cyanosis, clubbing, rash, edema Neuro: Intubated/sedated  Telemetry   SR 60-70s   Labs    CBC Recent Labs    06/06/22 0401 06/06/22 0747 06/06/22 0753 06/07/22 0440  WBC 21.2*  --   --  21.0*  HGB 11.4*   < > 11.2* 10.8*  HCT 34.3*   < > 33.0* 32.5*  MCV 92.2  --   --  94.2  PLT 70*  --   --  72*   < > = values in this interval not displayed.   Basic Metabolic Panel Recent Labs    06/06/22 0401 06/06/22 0747 06/06/22 1041 06/07/22 0440  NA 140   < > 139 141  K 3.6   < > 3.8 4.2  CL  95*  --  93* 97*  CO2 30  --  30 26  GLUCOSE 155*  --  126* 119*  BUN 53*  --  58* 65*  CREATININE 4.28*  --  4.63* 5.39*  CALCIUM 7.5*  --  7.6* 7.1*  MG 2.4  --  2.2  --   PHOS 7.0*  --   --   --    < > = values in this interval not displayed.   Liver Function Tests Recent Labs    06/06/22 1041 06/07/22 0440  AST 5,861* 2,099*  ALT 1,719* 1,229*  ALKPHOS 48 49  BILITOT 3.8* 4.6*  PROT 4.5* 4.5*  ALBUMIN 2.0* 1.9*   No results for input(s): "LIPASE", "AMYLASE" in the last 72 hours. Cardiac Enzymes No results for input(s): "CKTOTAL", "CKMB", "CKMBINDEX", "TROPONINI" in the last 72 hours.  BNP: BNP (last 3 results) Recent Labs    06/04/2022 0238 06/06/22 0401 06/07/22 0440  BNP 1,545.5* 3,856.6* 4,124.0*    ProBNP (last 3 results) No results for input(s): "PROBNP" in the last 8760 hours.   D-Dimer No results for input(s): "DDIMER" in the last 72 hours. Hemoglobin A1C Recent Labs    05/23/2022 0730  HGBA1C 5.8*  Fasting Lipid Panel Recent Labs    06/07/22 0440  TRIG 441*   Thyroid Function Tests No results for input(s): "TSH", "T4TOTAL", "T3FREE", "THYROIDAB" in the last 72 hours.  Invalid input(s): "FREET3"  Other results:   Imaging    ECHOCARDIOGRAM LIMITED  Result Date: 06/06/2022    ECHOCARDIOGRAM LIMITED REPORT   Patient Name:   [name] Date of Exam: 06/06/2022 Medical Rec #:  [id number]     Height:       70.0 in Accession #:    [id number]    Weight:       180.8 lb Date of Birth:  1940-10-07      BSA:          2.000 m Patient Age:    82 years      BP:           111/93 mmHg Patient Gender: M             HR:           69 bpm. Exam Location:  Inpatient Procedure: Limited Echo, Color Doppler and Cardiac Doppler Indications:    complete heart block. Aortic stenosis and regurgitation.  History:        Patient has prior history of Echocardiogram examinations, most                 recent 01/24/2022. Traumatic pneumothorax. Chronic kidney disease,                  Aortic Valve Disease; Risk Factors:Sleep Apnea, Hypertension and                 Dyslipidemia.  Sonographer:    [name] RDCS Referring Phys: [id number] [facility] H HENDERSON  Sonographer Comments: Echo performed with patient supine and on artificial respirator. Image acquisition challenging due to uncooperative patient and Image acquisition challenging due to respiratory motion. IMPRESSIONS  1. Left ventricular ejection fraction, by estimation, is 65 to 70%. The left ventricle has normal function. The left ventricle has no regional wall motion abnormalities.  2. Right ventricular systolic function is normal. The right ventricular size is normal.  3. Mild mitral valve regurgitation. Moderate to severe mitral annular calcification.  4. AVA ~ >1 cm2, DI is normal as well. AV gradient 42 mmhg in the severe range, heart is hyperdynamic. Mod-severe AS. There is severe calcifcation of the aortic valve. Moderate to severe aortic valve stenosis.  5. The inferior vena cava is dilated in size with >50% respiratory variability, suggesting right atrial pressure of 8 mmHg. Conclusion(s)/Recommendation(s): Compared to prior study 01/24/2022, less MR. FINDINGS  Left Ventricle: Left ventricular ejection fraction, by estimation, is 65 to 70%. The left ventricle has normal function. The left ventricle has no regional wall motion abnormalities. Right Ventricle: The right ventricular size is normal. Right ventricular systolic function is normal. Pericardium: There is no evidence of pericardial effusion. Mitral Valve: Moderate to severe mitral annular calcification. Mild mitral valve regurgitation. MV peak gradient, 7.7 mmHg. The mean mitral valve gradient is 3.0 mmHg. Tricuspid Valve: Tricuspid valve regurgitation is not demonstrated. Aortic Valve: AVA ~ >1 cm2, DI is normal as well. AV gradient 42 mmhg in the severe range, heart is hyperdynamic. Mod-severe AS. There is severe calcifcation of the aortic valve. Aortic regurgitation  PHT measures 259 msec. Moderate to severe aortic stenosis is present. Aortic valve mean gradient measures 42.0 mmHg. Aortic valve peak gradient measures 77.4 mmHg. Venous: The inferior vena cava is dilated in size  with greater than 50% respiratory variability, suggesting right atrial pressure of 8 mmHg. Additional Comments: Spectral Doppler performed. Color Doppler performed.  IVC IVC diam: 2.80 cm AORTIC VALVE AV Vmax:           440.00 cm/s AV Vmean:          298.000 cm/s AV VTI:            0.698 m AV Peak Grad:      77.4 mmHg AV Mean Grad:      42.0 mmHg LVOT Vmax:         149.50 cm/s LVOT Vmean:        100.400 cm/s LVOT VTI:          0.276 m LVOT/AV VTI ratio: 0.40 AI PHT:            259 msec MITRAL VALVE MV Peak grad: 7.7 mmHg  SHUNTS MV Mean grad: 3.0 mmHg  Systemic VTI: 0.28 m MV Vmax:      1.39 m/s MV Vmean:     74.2 cm/s Phineas Inches Electronically signed by Phineas Inches Signature Date/Time: 06/06/2022/10:55:25 AM    Final      Medications:     Scheduled Medications:  Chlorhexidine Gluconate Cloth  6 each Topical Daily   docusate  100 mg Per Tube BID   famotidine  20 mg Per Tube QHS   fluticasone furoate-vilanterol  1 puff Inhalation Daily   insulin aspart  1-3 Units Subcutaneous Q4H   mouth rinse  15 mL Mouth Rinse Q2H   polyethylene glycol  17 g Per Tube Daily   sodium chloride flush  10-40 mL Intracatheter Q12H    Infusions:  sodium chloride 250 mL (06/06/22 0651)   epinephrine 16 mcg/min (06/07/22 0700)   fentaNYL infusion INTRAVENOUS 100 mcg/hr (06/07/22 0700)   heparin 950 Units/hr (06/07/22 0700)   norepinephrine (LEVOPHED) Adult infusion 27 mcg/min (06/07/22 0700)   piperacillin-tazobactam (ZOSYN)  IV 2.25 g (06/07/22 0558)   propofol (DIPRIVAN) infusion 22 mcg/kg/min (06/07/22 0700)    PRN Medications: acetaminophen (TYLENOL) oral liquid 160 mg/5 mL, fentaNYL (SUBLIMAZE) injection, fentaNYL (SUBLIMAZE) injection, mouth rinse, sodium chloride flush     Assessment/Plan    1. PEA/Bradycardic/VT arrest -Initially transcutaneously placed. Developed PEA arrest as well as episodes of VT for which he was defibrillated - s/p emergent temporary pacer  - now with rhythm recovery with clearing of acidosis. TVP turned to backup 60 bpm -- discussed with family. Will continue aggressive care but will make DNR so no further CPR   2. Shock, likely cardiogenic +/- component of septic: -In setting of complete heart block followed by cardiac arrest requiring temporary pacer - Complicated by severe baseline AS - Echo 06/12/2022 EF 60-65% severe AS RV ok  -Started on empiric abx for possible PNA, developing infiltrate noted right mid lung - Overnight pressors increased. Norepi 27 Epi 18 Vaso 0.03  -Lactic acid > 9.0-->3->1.4  - CO-OX 78%   3. 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 HD cath placed. Starting CRRT    4. Severe aortic valve stenosis: -Echo 09/23: EF 60-65%, moderate LVH, RV okay, severe AS with mean gradient 37 mmhg, AVA 0.8 cm2 and DI 0.24 -Seen by structural team 11/23 - decided watchful waiting d/t lack of symptoms and preserved LV function but completed preTAVR workup - Will need  TAVR if recovers   5. Acute respiratory failure with hypoxia -Intubated, vent management per CCM -On abx with azithromycin and ceftriaxone for possible PNA - Wean vent as tolerated   6. R pneumothorax due to CPR - now with chest tube  7. Shock liver -In setting of shock - trend LFTs - avoid hepatotoxic agents   8. 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 I did not find prior hx of Afib in record - Heparin gtt   9.  Neuro - Beginning to follow commands  10. Elevated LFTs LFTs trending down  11. DNR     Length of Stay: 2  Darrick Grinder, NP  06/07/2022, 8:04 AM  Advanced Heart Failure Team Pager [phone]  (M-F; 7a - 5p)  Please contact [facility] for night-coverage after hours (5p -7a ) and weekends on [email or web address]    Agree with above.   On vent. Sedated but will follow some commands. Pressor requirements increasing. Not making urine. Not requiring TVP  General: intubated sedated HEENT: normal + ETT Neck: supple. JVP 6-7  Carotids 2+ bilat; no bruits. No lymphadenopathy or thryomegaly appreciated. Cor: PMI nondisplaced. Regular tachy + 2/6 AS Lungs: course Abdomen: soft, nontender, nondistended. No hepatosplenomegaly. No bruits or masses. Good bowel sounds. Extremities: no cyanosis, clubbing, rash, 1+ edema Neuro: intubated, sedated. Will arouse and follows ome commands  Ucx + E coli  He remains critically ill requiring high-dose pressor support. Suspect combination cardiogenic and septic shock. Now with anuric renal failure post arrest. Agree with starting CVVHD. Broaden abx. D/w family. Decision making day by day.  CRITICAL CARE Performed by: Glori Bickers  Total critical care time: 45 minutes  Critical care time was exclusive of separately billable procedures and treating other patients.  Critical care was necessary to treat or prevent imminent or life-threatening deterioration.  Critical care was time spent personally by me (independent of midlevel providers or residents) on the following activities: development of treatment plan with patient and/or surrogate as well as nursing, discussions with consultants, evaluation of patient's response to treatment, examination of patient, obtaining history from patient or surrogate, ordering and performing treatments and interventions, ordering and review of laboratory studies, ordering and review of radiographic studies, pulse oximetry and re-evaluation of patient's condition.  Glori Bickers, MD  5:34 PM

## 2022-06-07 NOTE — Progress Notes (Signed)
Patient ID: [name], male   DOB: [birth year], 82 y.o.   MRN: [id number] S: No events overnight but UOP has declined O:BP (!) 137/58   Pulse 91   Temp 99.9 F (37.7 C)   Resp 18   Ht '5\' 10"'$  (1.778 m)   Wt 87 kg   SpO2 100%   BMI 27.52 kg/m   Intake/Output Summary (Last 24 hours) at 06/07/2022 1054 Last data filed at 06/07/2022 1000 Gross per 24 hour  Intake 2119.15 ml  Output 911 ml  Net 1208.15 ml   Intake/Output: I/O last 3 completed shifts: In: 4688.6 [I.V.:4518.4; NG/GT:60; IV Piggyback:110.2] Out: 911 [Urine:251; Emesis/NG output:600; Chest Tube:60]  Intake/Output this shift:  Total I/O In: 312 [I.V.:312] Out: 65 [Urine:65] Weight change: 5 kg Gen: intubated and sedated CVS: RRR Resp: ventilated BS bilaterally Abd: +BS, soft, NT/nD Ext: trace pretibial and presacral edema  Recent Labs  Lab 05/28/2022 0349 05/29/2022 0407 05/27/2022 0752 06/03/2022 1105 05/26/2022 1129 06/06/22 0401 06/06/22 0747 06/06/22 0753 06/06/22 1041 06/07/22 0440  NA 133* 134*   < > 146* 141 140 139 139 139 141  K 4.6 4.5   < > 4.0 3.9 3.6 3.6 3.6 3.8 4.2  CL 99 100  --  99  --  95*  --   --  93* 97*  CO2 19*  --   --  18*  --  30  --   --  30 26  GLUCOSE 165* 161*  --  141*  --  155*  --   --  126* 119*  BUN 45* 39*  --  45*  --  53*  --   --  58* 65*  CREATININE 3.67* 3.60*  --  3.83*  --  4.28*  --   --  4.63* 5.39*  ALBUMIN 2.4*  --   --  2.0*  --   --   --   --  2.0* 1.9*  CALCIUM 8.1*  --   --  8.4*  --  7.5*  --   --  7.6* 7.1*  PHOS  --   --   --   --   --  7.0*  --   --   --   --   AST 170*  --   --  2,524*  --   --   --   --  5,861* 2,099*  ALT 80*  --   --  1,183*  --   --   --   --  1,719* 1,229*   < > = values in this interval not displayed.   Liver Function Tests: Recent Labs  Lab 06/10/2022 1105 06/06/22 1041 06/07/22 0440  AST 2,524* 5,861* 2,099*  ALT 1,183* 1,719* 1,229*  ALKPHOS 40 48 49  BILITOT 1.8* 3.8* 4.6*  PROT 4.7* 4.5* 4.5*  ALBUMIN 2.0* 2.0* 1.9*    No results for input(s): "LIPASE", "AMYLASE" in the last 168 hours. No results for input(s): "AMMONIA" in the last 168 hours. CBC: Recent Labs  Lab 05/24/2022 0349 05/27/2022 0407 06/06/22 0401 06/06/22 0747 06/06/22 0753 06/07/22 0440  WBC 17.9*  --  21.2*  --   --  21.0*  HGB 11.4*   < > 11.4* 11.6* 11.2* 10.8*  HCT 35.8*   < > 34.3* 34.0* 33.0* 32.5*  MCV 97.8  --  92.2  --   --  94.2  PLT 119*  --  70*  --   --  72*   < > =  values in this interval not displayed.   Cardiac Enzymes: No results for input(s): "CKTOTAL", "CKMB", "CKMBINDEX", "TROPONINI" in the last 168 hours. CBG: Recent Labs  Lab 06/06/22 1517 06/06/22 1949 06/06/22 2348 06/07/22 0336 06/07/22 0742  GLUCAP 119* 98 96 96 115*    Iron Studies: No results for input(s): "IRON", "TIBC", "TRANSFERRIN", "FERRITIN" in the last 72 hours. Studies/Results: ECHOCARDIOGRAM LIMITED  Result Date: 06/06/2022    ECHOCARDIOGRAM LIMITED REPORT   Patient Name:   [name] Date of Exam: 06/06/2022 Medical Rec #:  [id number]     Height:       70.0 in Accession #:    [id number]    Weight:       180.8 lb Date of Birth:  February 11, 1941      BSA:          2.000 m Patient Age:    88 years      BP:           111/93 mmHg Patient Gender: M             HR:           69 bpm. Exam Location:  Inpatient Procedure: Limited Echo, Color Doppler and Cardiac Doppler Indications:    complete heart block. Aortic stenosis and regurgitation.  History:        Patient has prior history of Echocardiogram examinations, most                 recent 01/24/2022. Traumatic pneumothorax. Chronic kidney disease,                 Aortic Valve Disease; Risk Factors:Sleep Apnea, Hypertension and                 Dyslipidemia.  Sonographer:    [name] RDCS Referring Phys: [id number] [facility] HENDERSON  Sonographer Comments: Echo performed with patient supine and on artificial respirator. Image acquisition challenging due to uncooperative patient and Image acquisition  challenging due to respiratory motion. IMPRESSIONS  1. Left ventricular ejection fraction, by estimation, is 65 to 70%. The left ventricle has normal function. The left ventricle has no regional wall motion abnormalities.  2. Right ventricular systolic function is normal. The right ventricular size is normal.  3. Mild mitral valve regurgitation. Moderate to severe mitral annular calcification.  4. AVA ~ >1 cm2, DI is normal as well. AV gradient 42 mmhg in the severe range, heart is hyperdynamic. Mod-severe AS. There is severe calcifcation of the aortic valve. Moderate to severe aortic valve stenosis.  5. The inferior vena cava is dilated in size with >50% respiratory variability, suggesting right atrial pressure of 8 mmHg. Conclusion(s)/Recommendation(s): Compared to prior study 01/24/2022, less MR. FINDINGS  Left Ventricle: Left ventricular ejection fraction, by estimation, is 65 to 70%. The left ventricle has normal function. The left ventricle has no regional wall motion abnormalities. Right Ventricle: The right ventricular size is normal. Right ventricular systolic function is normal. Pericardium: There is no evidence of pericardial effusion. Mitral Valve: Moderate to severe mitral annular calcification. Mild mitral valve regurgitation. MV peak gradient, 7.7 mmHg. The mean mitral valve gradient is 3.0 mmHg. Tricuspid Valve: Tricuspid valve regurgitation is not demonstrated. Aortic Valve: AVA ~ >1 cm2, DI is normal as well. AV gradient 42 mmhg in the severe range, heart is hyperdynamic. Mod-severe AS. There is severe calcifcation of the aortic valve. Aortic regurgitation PHT measures 259 msec. Moderate to severe aortic stenosis is present. Aortic valve mean gradient  measures 42.0 mmHg. Aortic valve peak gradient measures 77.4 mmHg. Venous: The inferior vena cava is dilated in size with greater than 50% respiratory variability, suggesting right atrial pressure of 8 mmHg. Additional Comments: Spectral Doppler  performed. Color Doppler performed.  IVC IVC diam: 2.80 cm AORTIC VALVE AV Vmax:           440.00 cm/s AV Vmean:          298.000 cm/s AV VTI:            0.698 m AV Peak Grad:      77.4 mmHg AV Mean Grad:      42.0 mmHg LVOT Vmax:         149.50 cm/s LVOT Vmean:        100.400 cm/s LVOT VTI:          0.276 m LVOT/AV VTI ratio: 0.40 AI PHT:            259 msec MITRAL VALVE MV Peak grad: 7.7 mmHg  SHUNTS MV Mean grad: 3.0 mmHg  Systemic VTI: 0.28 m MV Vmax:      1.39 m/s MV Vmean:     74.2 cm/s [name] Electronically signed by [name] Signature Date/Time: 06/06/2022/10:55:25 AM    Final    DG Chest Port 1 View  Result Date: 06/06/2022 CLINICAL DATA:  Hypoxia EXAM: PORTABLE CHEST 1 VIEW COMPARISON:  Chest x-ray dated June 05, 2022 FINDINGS: ETT, left subclavian line and enteric tube are unchanged in position. Unchanged position of transvenous pacing line. Stable right-sided chest tube. Cardiac and mediastinal contours within normal limits. Increased right basilar opacity. No large pleural effusion or evidence of pneumothorax. IMPRESSION: 1. Increased right basilar opacity, likely due to worsened atelectasis. 2. Stable right-sided chest tube.  No evidence of pneumothorax. 3. Stable support apparatus. Electronically Signed   By: Yetta Glassman M.D.   On: 06/06/2022 08:23   DG CHEST PORT 1 VIEW  Result Date: 06/16/2022 CLINICAL DATA:  [id number] S/P chest tube placement [facility]) [id number] EXAM: PORTABLE CHEST 1 VIEW COMPARISON:  Radiograph 06/06/2022 FINDINGS: Endotracheal tube overlies the midthoracic trachea approximately 5.0 cm above the carina. Orogastric tube tip and side port overlie the stomach. Defibrillator pads overlie the chest. There is a left approach central venous catheter with tip overlying the mid SVC. Right neck catheter overlies the right atrium, obscured by a defibrillator pad. Unchanged cardiomediastinal silhouette. Interval right apical pigtail chest tube placement with significantly  decreased right-sided pneumothorax. No visible residual pneumothorax identified. Expansion of the right lung with moderate distal airspace disease. Suspected trace effusions. There are right upper rib fractures, involving ribs 4 and 5. Subcutaneous emphysema along the right chest wall. IMPRESSION: Decreased right pneumothorax after apical chest tube placement, without residual pneumothorax identified. Re-expansion of the right lung with interstitial and airspace opacities, which could represent re-expansion edema. Right lateral fourth and fifth rib fractures. Electronically Signed   By: Maurine Simmering M.D.   On: 05/27/2022 14:31    Chlorhexidine Gluconate Cloth  6 each Topical Daily   docusate  100 mg Per Tube BID   famotidine  20 mg Per Tube QHS   fluticasone furoate-vilanterol  1 puff Inhalation Daily   insulin aspart  1-3 Units Subcutaneous Q4H   mouth rinse  15 mL Mouth Rinse Q2H   polyethylene glycol  17 g Per Tube Daily   sodium chloride flush  10-40 mL Intracatheter Q12H    BMET    Component Value Date/Time   NA  141 06/07/2022 0440   NA 140 04/08/2022 0815   K 4.2 06/07/2022 0440   CL 97 (L) 06/07/2022 0440   CO2 26 06/07/2022 0440   GLUCOSE 119 (H) 06/07/2022 0440   BUN 65 (H) 06/07/2022 0440   BUN 27 04/08/2022 0815   CREATININE 5.39 (H) 06/07/2022 0440   CALCIUM 7.1 (L) 06/07/2022 0440   GFRNONAA 10 (L) 06/07/2022 0440   GFRAA >60 12/04/2019 0321   CBC    Component Value Date/Time   WBC 21.0 (H) 06/07/2022 0440   RBC 3.45 (L) 06/07/2022 0440   HGB 10.8 (L) 06/07/2022 0440   HGB 13.8 03/18/2022 0855   HCT 32.5 (L) 06/07/2022 0440   HCT 42.2 03/18/2022 0855   PLT 72 (L) 06/07/2022 0440   PLT 163 03/18/2022 0855   MCV 94.2 06/07/2022 0440   MCV 94 03/18/2022 0855   MCH 31.3 06/07/2022 0440   MCHC 33.2 06/07/2022 0440   RDW 14.8 06/07/2022 0440   RDW 14.1 03/18/2022 0855   LYMPHSABS 2.0 03/13/2016 1608   MONOABS 1.0 03/13/2016 1608   EOSABS 0.2 03/13/2016 1608    BASOSABS 0.1 03/13/2016 1608     Assessment/Plan:  AKI/CKD stage IIIb - presumably due to ischemic ATN in setting of PEA arrest.  Remains oliguric/anuric and worsening BUN/Cr.  Discussed with PCCM and will plan to initiate CRRT 4K/2.5Ca baths, pre and post-filter 400 mL/hr, dialysate 1500 mL/hr, UF 50-150 mL/hr as bp tolerates.    Avoid nephrotoxic medications including NSAIDs and iodinated intravenous contrast exposure unless the latter is absolutely indicated.   Preferred narcotic agents for pain control are hydromorphone, fentanyl, and methadone. Morphine should not be used.  Avoid Baclofen and avoid oral sodium phosphate and magnesium citrate based laxatives / bowel preps.  Continue strict Input and Output monitoring.  Will monitor the patient closely with you and intervene or adjust therapy as indicated by changes in clinical status/labs  Cardiogenic shock - likely septic component.  Continue with pressor support PEA arrest followed by VT - per PCCM and Cardiology Acute hypoxic respiratory failure - on vent per PCCM CAP - antibiotics per primary svc Shock liver - improving. Anemia of CKD - stable thrombocytopenia  Donetta Potts, MD [facility]

## 2022-06-07 NOTE — Procedures (Signed)
Central Venous Catheter Insertion Procedure Note  [name]  [id number]  05/10/1941  Date:06/07/22  Time:1:57 PM   Provider Performing:Annalyse Langlais   Procedure: Insertion of Non-tunneled Central Venous Catheter(36556)with US guidance (60109)    Indication(s) Hemodialysis  Consent Risks of the procedure as well as the alternatives and risks of each were explained to the patient and/or caregiver.  Consent for the procedure was obtained and is signed in the bedside chart  Anesthesia Topical only with 1% lidocaine   Timeout Verified patient identification, verified procedure, site/side was marked, verified correct patient position, special equipment/implants available, medications/allergies/relevant history reviewed, required imaging and test results available.  Sterile Technique Maximal sterile technique including full sterile barrier drape, hand hygiene, sterile gown, sterile gloves, mask, hair covering, sterile ultrasound probe cover (if used).  Procedure Description Area of catheter insertion was cleaned with chlorhexidine and draped in sterile fashion.   With real-time ultrasound guidance a HD catheter was placed into the left internal jugular vein.  Nonpulsatile blood flow and easy flushing noted in all ports.  The catheter was sutured in place and sterile dressing applied.  Complications/Tolerance None; patient tolerated the procedure well. Chest X-ray is ordered to verify placement for internal jugular or subclavian cannulation.  Chest x-ray is not ordered for femoral cannulation.  EBL Minimal  Specimen(s) None

## 2022-06-07 NOTE — Progress Notes (Signed)
NAME:  [name], MRN:  [id number], DOB:  1941-03-08, LOS: 2 ADMISSION DATE:  06/15/2022 CONSULTATION DATE:  05/30/2022 REFERRING MD:  [name] - EDP CHIEF COMPLAINT:  CHB, hypotension, ?sepsis   History of Present Illness:  [name] 82 year old gentleman with a history of provoked PE, mild COPD, OSA, severe AS who presented with acute SOB and weakness that began last evening. He has a mild chronic cough that is unchanged. With EMS he was noted to have a heart rate in the 30s and hypotension, as low as 42/20. He was stared on epi and atropine in the field. He was TC paced in the ED with an appropriate response. Cardiology evaluated the patient at bedside and stopped TC pacing and felt that he was a poor candidate for TV pacing. CXR concerning for possible R pneumonia, and he was given fluids and antibiotics for sepsis after blood cultures were collected. He denies change in baseline cough. He has been compliant with his inhalers. He has increase SOB that began suddenly last night. He denies CP. Recently he has had a decreased appetite since treatment for a UTI. He complaints today of feeling thirsty.   After admission but while still awaiting ICU bed (in ED) pt had cardiac arrest. Initially PEA, then VT. He had a TVP placed emergently 1/17 after ROSC   Pertinent Medical History:   Past Medical History:  Diagnosis Date   Anxiety    reports scotch helps that    Aortic stenosis, mild 08/18/2018   Mean gradient 14 mmHg 04/2018   Arthritis    " it's everywhere"   Blood transfusion without reported diagnosis    Cataract    having cataract surgery July 2016   Chronic bronchitis ([place])    Chronic kidney disease    hx kidney stones   Colon polyps    Complication of anesthesia    after surgery 07-2013 unable to sleep for 3 days   COPD (chronic obstructive pulmonary disease) ([place])    Coronary artery calcification seen on CAT scan 08/18/2018   GERD (gastroesophageal reflux disease)    History  of kidney stones    Hyperlipidemia    Hypertension    Prostate hypertrophy    Pulmonary embolism ([place]) 12/2014   post TJR   Seasonal allergies    Skin cancer    squamous cell, 1 melanoma left shoulder, one episode- under tongue, has had a total of 17-18 episodes    Sleep apnea    had surgery in 2000 for sleep apnea, no CPAP needed   Thoracic aortic aneurysm ([place]) 05/04/2019   4.6 cm 04/2019   Significant Hospital Events: Including procedures, antibiotic start and stop dates in addition to other pertinent events   1/17 admitted, cardiac arrest, started on epi infusion 1/18 awake and follwing commands. Dc bicarb gtt. Nephro cpnsult 1/19 CRRT. Changing zosyn to mero. Ecoli in urine and staph epi in blood (vs contaminant)    Interim History / Subjective:   Sedation incr overnight, fent gtt and prop Pressors incr overnight: Epi 17 NE 27   Still not making adequate urine  BNP up to 4K  Cr up to 5.39 LFTs down trending but still elevated   WBC 21  Hgb 10.8 Plt 72 INR 3.1  Trigs are 441  Had a run of SVT which improved with a small fent bolus   Objective:  Blood pressure (!) 137/58, pulse 77, temperature 100.2 F (37.9 C), resp. rate 16, height '5\' 10"'$  (1.778 m),  weight 87 kg, SpO2 (!) 89 %. CVP:  [2 mmHg-7 mmHg] 7 mmHg  Vent Mode: PRVC FiO2 (%):  [40 %] 40 % Set Rate:  [15 bmp] 15 bmp Vt Set:  [580 mL] 580 mL PEEP:  [5 cmH20] 5 cmH20 Plateau Pressure:  [15 cmH20-24 cmH20] 19 cmH20   Intake/Output Summary (Last 24 hours) at 06/07/2022 1126 Last data filed at 06/07/2022 1100 Gross per 24 hour  Intake 2089.28 ml  Output 1136 ml  Net 953.28 ml   Filed Weights   06/12/2022 0345 06/06/22 0500 06/07/22 0428  Weight: 81.6 kg 82 kg 87 kg    Physical Examination:  General: critically ill older adult M intubated sedated  HEENT: NCAT pink mm ETT secure  Neuro: awakens to voice, follows commands Pulm: Mechanically ventilated. Diffuse crackles, rales  Heart: irregular.  Cap  refill is < 3sec  Abdomen: soft ndnt hypoactive  MSK: no acute joint deformity, + edema Skin: clean dry   Resolved Hospital Problem List:    Assessment & Plan:   PEA arrest followed by VT -35-40 min downtime after arresting in ED -PEA -->  VT -neuro exam is promising, following commands beginning 1/18  P -supportive care -decr sedation as able   Acute metabolic  encephalopathy Agitation  -sepsis renal failure, sedation, shock P -RASS goal 0  -wean sedation as able -verbal redirection  -add enteral agents   Acute respiratory failure with hypoxia and hypercarbia Possible aspiration PNA R ptx due to CPR Pulmonary edema  Left pleural effusion  P -cont chest tube  -think in order to make further vent progress will need volume off  -VAP, pulm hygiene -changing zosyn to [facility] 1/19   Septic shock 2/2 E.coli UTI, PNA, ?staph epi bacteremia  P -adding vaso 1/19, has facilitated weaning Epi some -change zosyn to mero  -trend WBC, fever curve -follow cx data   Cardiogenic shock Complete heart block  Severe AS Demand ischemia  -preserved LVEF, ok RV. 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 Might need to consider different agent. Do not think dex would be a good fit with his cardiac hx   GOC DNR status -Adv HF  MD discussed code status and arrived at DNR in case of subsequent arrest, but continue full scope of medical tx otherwise   Best Practice: (right click and "Reselect all SmartList Selections" daily)   Diet/type: NPO DVT prophylaxis: SCD GI prophylaxis: H2B Lines: Central line, Arterial Line, and yes and it is still needed Foley:  Yes, and it is still needed Code Status:  DNR Last date of multidisciplinary goals of care discussion [1/19 updated at bedside 1/19 and dw Adv HF  Labs:  CBC: Recent Labs  Lab 05/24/2022 0349 06/03/2022 0407 06/02/2022 1129 06/06/22 0401 06/06/22 0747 06/06/22 0753  06/07/22 0440  WBC 17.9*  --   --  21.2*  --   --  21.0*  HGB 11.4*   < > 11.6* 11.4* 11.6* 11.2* 10.8*  HCT 35.8*   < > 34.0* 34.3* 34.0* 33.0* 32.5*  MCV 97.8  --   --  92.2  --   --  94.2  PLT 119*  --   --  70*  --   --  72*   < > = values in this interval not displayed.   Basic Metabolic Panel: Recent Labs  Lab 06/03/2022 0349 05/27/2022 0407 05/20/2022 0752 05/20/2022 1105 06/14/2022 1129 06/06/22 0401 06/06/22 0747 06/06/22 0753 06/06/22 1041 06/07/22 0440  NA 133* 134*   < > 146*   < > 140 139 139 139 141  K 4.6 4.5   < > 4.0   < > 3.6 3.6 3.6 3.8 4.2  CL 99 100  --  99  --  95*  --   --  93* 97*  CO2 19*  --   --  18*  --  30  --   --  30 26  GLUCOSE 165* 161*  --  141*  --  155*  --   --  126* 119*  BUN 45* 39*  --  45*  --  53*  --   --  58* 65*  CREATININE 3.67* 3.60*  --  3.83*  --  4.28*  --   --  4.63* 5.39*  CALCIUM 8.1*  --   --  8.4*  --  7.5*  --   --  7.6* 7.1*  MG  --   --   --   --   --  2.4  --   --  2.2  --   PHOS  --   --   --   --   --  7.0*  --   --   --   --    < > = values in this interval not displayed.   GFR: Estimated Creatinine Clearance: 11.5 mL/min (A) (by C-G formula based on SCr of 5.39 mg/dL (H)). Recent Labs  Lab 06/06/2022 0349 06/11/2022 0629 06/04/2022 1105 06/14/2022 1536 06/06/22 0401 06/06/22 0700 06/07/22 0101 06/07/22 0440  WBC 17.9*  --    --   --  21.2*  --   --  21.0*  LATICACIDVEN 6.3*   < > >9.0* >9.0*  --  3.1* 1.4  --    < > = values in this interval not displayed.   Liver Function Tests: Recent Labs  Lab 05/31/2022 0349 05/23/2022 1105 06/06/22 1041 06/07/22 0440  AST 170* 2,524* 5,861* 2,099*  ALT 80* 1,183* 1,719* 1,229*  ALKPHOS 28* 40 48 49  BILITOT 1.3* 1.8* 3.8* 4.6*  PROT 5.3* 4.7* 4.5* 4.5*  ALBUMIN 2.4* 2.0* 2.0* 1.9*   No results for input(s): "LIPASE", "AMYLASE" in the last 168 hours. No results for input(s): "AMMONIA" in the last 168 hours.  ABG:    Component Value Date/Time   PHART 7.527 (H) 06/06/2022 0753   PCO2ART 37.7 06/06/2022 0753   PO2ART 229 (H) 06/06/2022 0753   HCO3 31.1 (H) 06/06/2022 0753   TCO2 32 06/06/2022 0753   ACIDBASEDEF 7.0 (H) 06/02/2022 1129   O2SAT 78.4 06/07/2022 0839    Coagulation Profile: Recent Labs  Lab 05/27/2022 0349 06/06/22 0156 06/07/22 0440  INR 2.4* 4.9* 3.1*   Cardiac Enzymes: No results for input(s): "CKTOTAL", "CKMB", "CKMBINDEX", "TROPONINI" in the last 168  hours.  HbA1C: Hgb A1c MFr Bld  Date/Time Value Ref Range Status  06/14/2022 07:30 AM 5.8 (H) 4.8 - 5.6 % Final    Comment:    (NOTE) Pre diabetes:          5.7%-6.4%  Diabetes:              >6.4%  Glycemic control for   <7.0% adults with diabetes     CBG: Recent Labs  Lab 06/06/22 1517 06/06/22 1949 06/06/22 2348 06/07/22 0336 06/07/22 0742  GLUCAP 119* 98 96 96 115*    CRITICAL CARE Performed by: Cristal Generous   Total critical care time: 40 minutes  Critical care time was exclusive of separately billable procedures and treating other patients. Critical care was necessary to treat or prevent imminent or life-threatening deterioration.  Critical care was time spent personally by me on the following activities: development of treatment plan with patient and/or surrogate as well as nursing, discussions with consultants, evaluation of patient's response to treatment,  examination of patient, obtaining history from patient or surrogate, ordering and performing treatments and interventions, ordering and review of laboratory studies, ordering and review of radiographic studies, pulse oximetry and re-evaluation of patient's condition.  Eliseo Gum MSN, AGACNP-BC [place] for pager  06/07/2022, 11:26 AM

## 2022-06-07 NOTE — Progress Notes (Signed)
Initial Nutrition Assessment  DOCUMENTATION CODES:   Not applicable  INTERVENTION:  Initiate trickle tube feeds via OGT per MD: -Vital 1.5 at 20 mL/hour  While on trickle tube feeds, provide PROSource TF20 60 mL every 4 hours to maximize protein intake.  Once appropriate to advance tube feeds via OGT to goal regimen recommend: -Advance Vital 1.5 by 10 mL/hour every 8 hours to goal rate of 55 mL/hour (1320 mL goal daily volume) -Provide PROSource TF20 60 mL TID per tube -Goal regimen provides: 2220 kcal, 149 grams of protein, 1003 mL H2O daily  Provide Rena-vite QHS per tube.  Plan is to place Cortrak tube today or 1/22 pending schedule.  NUTRITION DIAGNOSIS:   Inadequate oral intake related to inability to eat as evidenced by NPO status.  GOAL:   Provide needs based on ASPEN/SCCM guidelines  MONITOR:   Vent status, Labs, Weight trends, TF tolerance, I & O's  REASON FOR ASSESSMENT:   Ventilator, Consult Enteral/tube feeding initiation and management  ASSESSMENT:   82 year old male with PMHx of PE, mild COPD, OSA, severe aortic stenosis admitted with severe symptomatic bradycardia complicated with complete heart block and PEA cardiac arrest now in cardiogenic/septic shock and acute respiratory failure, also with right-sided pneumothorax in the setting of CPR s/p chest tube, UTI, staph epi bacteremia, AKI on CKD stage IIIb due to ischemic ATN, chronic urinary retention, shock liver.  1/17: admitted, cardiac arrest, intubated, chest tube placed 1/19: starting CRRT   No family at bedside at time of RD assessment. RN reports edema in lower extremities has improved. Per review of wt history in chart pt was 86.9 kg on 08/28/21. He lost to 82.1 kg on 03/18/22. That is a wt loss of 4.8 kg or 5.5% weight over approximately 6 months, which is not significant for time frame. Suspect current wt of 87 kg likely falsely elevated. Will use wt of 82 kg from 1/18 at this time. Noted plan for  initiation of CRRT.  Patient is currently intubated on ventilator support MV: 7.8 L/min Temp (24hrs), Avg:100 F (37.8 C), Min:97.7 F (36.5 C), Max:101.1 F (38.4 C)  Propofol: 7.34 ml/hr (194 kcal daily)  Enteral Access: OGT placed 1/17; tube tip and side port in stomach per chest x-ray 06/11/2022  Medications reviewed and include: colace, famotidine, Novolog 1-3 units every 4 hours, Miralax, epinephrine gtt at 12 mcg/min, fentanyl gtt, heparin, meropenem, norepinephrine gtt at 26 mcg/min, propofol gtt, vasopressin at 0.03 units/min (started this AM)  Labs reviewed: CBG 96-115, Chloride 97, BUN 65, Creatinine 5.39, AST 2099, ALT 1229  UOP: 251 mL (0.1 mL/kg/hr)  Chest tube: 60 mL  I/O: +8741.9 mL since admission  Discussed with MD. Per MD plan is to only initiate trickle tube feeds today in setting of increasing pressor requirements. Plan for possible Cortrak tube today if time on schedule.  NUTRITION - FOCUSED PHYSICAL EXAM:  Flowsheet Row Most Recent Value  Orbital Region Mild depletion  Upper Arm Region No depletion  Thoracic and Lumbar Region No depletion  Buccal Region Unable to assess  Temple Region Mild depletion  Clavicle Bone Region No depletion  Clavicle and Acromion Bone Region Mild depletion  Scapular Bone Region Moderate depletion  Dorsal Hand No depletion  Patellar Region Mild depletion  Anterior Thigh Region Mild depletion  Posterior Calf Region Moderate depletion  Edema (RD Assessment) Mild  Hair Reviewed  Eyes Unable to assess  Mouth Unable to assess  Skin Reviewed  Nails Reviewed  Diet Order:   Diet Order             Diet NPO time specified Except for: Sips with Meds, Ice Chips  Diet effective now                  EDUCATION NEEDS:   No education needs have been identified at this time  Skin:  Skin Assessment: Reviewed RN Assessment  Last BM:  06/09/2022 per chart  Height:   Ht Readings from Last 1 Encounters:  05/23/2022 '5\' 10"'$   (1.778 m)   Weight:   Wt Readings from Last 1 Encounters:  06/07/22 87 kg   Ideal Body Weight:  75.5 kg  BMI:  Body mass index is 27.52 kg/m.  Estimated Nutritional Needs:   Kcal:  2200-2400  Protein:  140-160 grams  Fluid:  2.2-2.4 L/day  Loanne Drilling, MS, RD, LDN, CNSC Pager number available on Amion

## 2022-06-07 NOTE — Progress Notes (Signed)
PHARMACY NOTE:  ANTIMICROBIAL RENAL DOSAGE ADJUSTMENT  Current antimicrobial regimen includes a mismatch between antimicrobial dosage and estimated renal function.  As per policy approved by the Pharmacy & Therapeutics and Medical Executive Committees, the antimicrobial dosage will be adjusted accordingly.  Current antimicrobial dosage:  meropenem 1gm IV q12h  Indication: PNA, UTI  Renal Function:  Estimated Creatinine Clearance: 11.5 mL/min (A) (by C-G formula based on SCr of 5.39 mg/dL (H)). '[]'$      On intermittent HD, scheduled:      On CRRT    Antimicrobial dosage has been changed to:  meropenem 1gm IV q8h  Additional comments:   Thank you for allowing pharmacy to be a part of this patient's care.  Hildred Laser, PharmD Clinical Pharmacist **Pharmacist phone directory can now be found on [email or web address] ([email or web address]).  Listed under [place].

## 2022-06-08 DIAGNOSIS — N170 Acute kidney failure with tubular necrosis: Secondary | ICD-10-CM

## 2022-06-08 DIAGNOSIS — I469 Cardiac arrest, cause unspecified: Secondary | ICD-10-CM | POA: Diagnosis not present

## 2022-06-08 DIAGNOSIS — N179 Acute kidney failure, unspecified: Secondary | ICD-10-CM | POA: Diagnosis not present

## 2022-06-08 DIAGNOSIS — I442 Atrioventricular block, complete: Secondary | ICD-10-CM | POA: Diagnosis not present

## 2022-06-08 DIAGNOSIS — J9601 Acute respiratory failure with hypoxia: Secondary | ICD-10-CM | POA: Diagnosis not present

## 2022-06-08 LAB — CBC
HCT: 33.3 % — ABNORMAL LOW (ref 39.0–52.0)
Hemoglobin: 10.9 g/dL — ABNORMAL LOW (ref 13.0–17.0)
MCH: 31.3 pg (ref 26.0–34.0)
MCHC: 32.7 g/dL (ref 30.0–36.0)
MCV: 95.7 fL (ref 80.0–100.0)
Platelets: 63 10*3/uL — ABNORMAL LOW (ref 150–400)
RBC: 3.48 MIL/uL — ABNORMAL LOW (ref 4.22–5.81)
RDW: 15.1 % (ref 11.5–15.5)
WBC: 9.8 10*3/uL (ref 4.0–10.5)
nRBC: 1.3 % — ABNORMAL HIGH (ref 0.0–0.2)

## 2022-06-08 LAB — POCT I-STAT 7, (LYTES, BLD GAS, ICA,H+H)
Acid-Base Excess: 0 mmol/L (ref 0.0–2.0)
Bicarbonate: 26.8 mmol/L (ref 20.0–28.0)
Calcium, Ion: 0.96 mmol/L — ABNORMAL LOW (ref 1.15–1.40)
HCT: 32 % — ABNORMAL LOW (ref 39.0–52.0)
Hemoglobin: 10.9 g/dL — ABNORMAL LOW (ref 13.0–17.0)
O2 Saturation: 94 %
Patient temperature: 36.7
Potassium: 4.8 mmol/L (ref 3.5–5.1)
Sodium: 139 mmol/L (ref 135–145)
TCO2: 28 mmol/L (ref 22–32)
pCO2 arterial: 53.7 mmHg — ABNORMAL HIGH (ref 32–48)
pH, Arterial: 7.305 — ABNORMAL LOW (ref 7.35–7.45)
pO2, Arterial: 78 mmHg — ABNORMAL LOW (ref 83–108)

## 2022-06-08 LAB — HEPARIN LEVEL (UNFRACTIONATED): Heparin Unfractionated: 0.42 IU/mL (ref 0.30–0.70)

## 2022-06-08 LAB — COOXEMETRY PANEL
Carboxyhemoglobin: 1.8 % — ABNORMAL HIGH (ref 0.5–1.5)
Carboxyhemoglobin: 2 % — ABNORMAL HIGH (ref 0.5–1.5)
Methemoglobin: <0.7 % (ref 0.0–1.5)
Methemoglobin: <0.7 % (ref 0.0–1.5)
O2 Saturation: 74.7 %
O2 Saturation: 76.2 %
Total hemoglobin: 11 g/dL — ABNORMAL LOW (ref 12.0–16.0)
Total hemoglobin: 10.6 g/dL — ABNORMAL LOW (ref 12.0–16.0)

## 2022-06-08 LAB — CULTURE, BLOOD (ROUTINE X 2)

## 2022-06-08 LAB — RENAL FUNCTION PANEL
Albumin: 1.9 g/dL — ABNORMAL LOW (ref 3.5–5.0)
Anion gap: 14 (ref 5–15)
BUN: 38 mg/dL — ABNORMAL HIGH (ref 8–23)
CO2: 23 mmol/L (ref 22–32)
Calcium: 7.1 mg/dL — ABNORMAL LOW (ref 8.9–10.3)
Chloride: 101 mmol/L (ref 98–111)
Creatinine, Ser: 3.03 mg/dL — ABNORMAL HIGH (ref 0.61–1.24)
GFR, Estimated: 20 mL/min — ABNORMAL LOW (ref >60)
Glucose, Bld: 117 mg/dL — ABNORMAL HIGH (ref 70–99)
Phosphorus: 4.8 mg/dL — ABNORMAL HIGH (ref 2.5–4.6)
Potassium: 4.7 mmol/L (ref 3.5–5.1)
Sodium: 138 mmol/L (ref 135–145)

## 2022-06-08 LAB — URINE CULTURE: Culture: >=100000 — AB

## 2022-06-08 LAB — GLUCOSE, CAPILLARY
Glucose-Capillary: 104 mg/dL — ABNORMAL HIGH (ref 70–99)
Glucose-Capillary: 130 mg/dL — ABNORMAL HIGH (ref 70–99)
Glucose-Capillary: 130 mg/dL — ABNORMAL HIGH (ref 70–99)

## 2022-06-08 LAB — CULTURE, RESPIRATORY W GRAM STAIN: Culture: NORMAL

## 2022-06-08 LAB — APTT: aPTT: 147 seconds — ABNORMAL HIGH (ref 24–36)

## 2022-06-08 LAB — TRIGLYCERIDES: Triglycerides: 229 mg/dL — ABNORMAL HIGH (ref <150)

## 2022-06-08 LAB — MAGNESIUM: Magnesium: 2.3 mg/dL (ref 1.7–2.4)

## 2022-06-08 MED ORDER — PHENYLEPHRINE HCL-NACL 20-0.9 MG/250ML-% IV SOLN
0.0000 ug/min | INTRAVENOUS | Status: DC
  Administered 2022-06-08: 20 ug/min via INTRAVENOUS
  Filled 2022-06-08: qty 500

## 2022-06-08 MED ORDER — QUETIAPINE FUMARATE 50 MG PO TABS: 50.0000 mg | ORAL_TABLET | Freq: Every day | ORAL | Status: DC

## 2022-06-08 MED ORDER — PHENYLEPHRINE CONCENTRATED 100MG/250ML (0.4 MG/ML) INFUSION SIMPLE
0.0000 ug/min | INTRAVENOUS | Status: DC
  Administered 2022-06-08: 40 ug/min via INTRAVENOUS
  Filled 2022-06-08: qty 250

## 2022-06-13 ENCOUNTER — Ambulatory Visit: Payer: Medicare Other | Admitting: Internal Medicine

## 2022-06-20 NOTE — Progress Notes (Signed)
   With family at bedside was premedicated with versed and fentanyl.   I performed terminal extubation personally with help of bedside RN.  Patient passed quickly and quietly.   Pronounced at 1130a.  Glori Bickers, MD  11:54 AM

## 2022-06-20 NOTE — Progress Notes (Signed)
NAME:  [name], MRN:  [id number], DOB:  08/24/1940, LOS: 3 ADMISSION DATE:  06/12/2022 CONSULTATION DATE:  06/12/2022 REFERRING MD:  Christy Gentles - EDP CHIEF COMPLAINT:  CHB, hypotension, ?sepsis   History of Present Illness:  [name] 82 year old gentleman with a history of provoked PE, mild COPD, OSA, severe AS who presented with acute SOB and weakness that began last evening. He has a mild chronic cough that is unchanged. With EMS he was noted to have a heart rate in the 30s and hypotension, as low as 42/20. He was stared on epi and atropine in the field. He was TC paced in the ED with an appropriate response. Cardiology evaluated the patient at bedside and stopped TC pacing and felt that he was a poor candidate for TV pacing. CXR concerning for possible R pneumonia, and he was given fluids and antibiotics for sepsis after blood cultures were collected. He denies change in baseline cough. He has been compliant with his inhalers. He has increase SOB that began suddenly last night. He denies CP. Recently he has had a decreased appetite since treatment for a UTI. He complaints today of feeling thirsty.   After admission but while still awaiting ICU bed (in ED) pt had cardiac arrest. Initially PEA, then VT. He had a TVP placed emergently 1/17 after ROSC   Pertinent Medical History:   Past Medical History:  Diagnosis Date   Anxiety    reports scotch helps that    Aortic stenosis, mild 08/18/2018   Mean gradient 14 mmHg 04/2018   Arthritis    " it's everywhere"   Blood transfusion without reported diagnosis    Cataract    having cataract surgery July 2016   Chronic bronchitis ([place])    Chronic kidney disease    hx kidney stones   Colon polyps    Complication of anesthesia    after surgery 07-2013 unable to sleep for 3 days   COPD (chronic obstructive pulmonary disease) ([place])    Coronary artery calcification seen on CAT scan 08/18/2018   GERD (gastroesophageal reflux disease)    History  of kidney stones    Hyperlipidemia    Hypertension    Prostate hypertrophy    Pulmonary embolism ([place]) 12/2014   post TJR   Seasonal allergies    Skin cancer    squamous cell, 1 melanoma left shoulder, one episode- under tongue, has had a total of 17-18 episodes    Sleep apnea    had surgery in 2000 for sleep apnea, no CPAP needed   Thoracic aortic aneurysm ([place]) 05/04/2019   4.6 cm 04/2019   Significant Hospital Events: Including procedures, antibiotic start and stop dates in addition to other pertinent events   1/17 admitted, cardiac arrest, started on epi infusion 1/18 awake and follwing commands. Dc bicarb gtt. Nephro cpnsult 1/19 CRRT. Changing zosyn to mero. Ecoli in urine and staph epi in blood (vs contaminant)    Interim History / Subjective:   Patient is on max dose Levophed at 40 mics, epinephrine at 20 mics and vasopressin at 0.04 Last night he was started on phenylephrine His blood pressure fluctuating from systolic 92-330 Remained afebrile On CRRT  Objective:  Blood pressure 106/62, pulse (!) 132, temperature 97.7 F (36.5 C), resp. rate 15, height '5\' 10"'$  (1.778 m), weight 88.6 kg, SpO2 99 %. CVP:  [1 mmHg-7 mmHg] 2 mmHg  Vent Mode: PRVC FiO2 (%):  [40 %] 40 % Set Rate:  [15 bmp] 15 bmp  Vt Set:  [580 mL] 580 mL PEEP:  [5 cmH20] 5 cmH20 Plateau Pressure:  [17 cmH20-21 cmH20] 18 cmH20   Intake/Output Summary (Last 24 hours) at 07/02/2022 0923 Last data filed at 07-02-22 0900 Gross per 24 hour  Intake 3349.1 ml  Output 3357 ml  Net -7.9 ml   Filed Weights   06/06/22 0500 06/07/22 0428 07-02-2022 0500  Weight: 82 kg 87 kg 88.6 kg      Physical exam: General: Crtitically ill-appearing male, orally intubated HEENT: [place]/AT, eyes anicteric.  ETT and OGT in place Neuro: Sedated, not following commands.  Eyes are closed.  Pupils 3 mm bilateral reactive to light Chest: Bilateral basal crackles, no wheezes or rhonchi Heart: Paced rhythm, system with murmur  heard Abdomen: Soft, nondistended, bowel sounds present Skin: No rash    Resolved Hospital Problem List:    Assessment & Plan:  PEA arrest followed by VT 35-40 min downtime after arresting in ED PEA -->  VT s/p defibrillation x 2 Continue supportive care  Acute septic-metabolic encephalopathy Intermittent agitation  Due to sepsis renal failure, sedation, shock Propofol was stopped Continue Versed and fentanyl infusion with RASS goal -1 Continue Klonopin and oxycodone Continue Seroquel  Acute respiratory failure with hypoxia and hypercarbia Probable aspiration PNA R ptx due to CPR s/p anterior chest tube Pulmonary edema  Left pleural effusion  Continue on protective ventilation VAP prevention bundle in place PAD protocol with Versed and fentanyl Continue broad-spectrum antibiotics with meropenem Continue chest tube on suction Cultures  Septic shock 2/2 E.coli UTI, PNA, Staph epi bacteremia  Lactic acidosis Patient is requiring multiple vasopressor support currently on Levophed, epinephrine, vasopressin and phenylephrine Urine culture grew pansensitive E. coli Respiratory culture is negative Blood culture grew staph epi MSSE Continue meropenem Trend lactate  Cardiogenic shock Complete heart block  Severe AS Demand ischemia  Echocardiogram showed preserved LVEF, ok RV. Initially cards fellow declined TVP but then had arrest req emergent TVP 1/17 Advanced heart failure is following Trend coox, cvp   AKI on CKD 3b, ischemic/septic ATN  Acute on chronic urinary retention Patient had a chronic foley at home for a while, more recently I/Os at home  He remained anuric Continue CRRT Monitor intake and output Avoid nephrotoxic agents  Shock liver in the setting of cardiac arrest Coagulopathy  Trend these and PT/INR  Anemia Thrombocytopenia  Due to critical illness Trend CBC with platelets  Hypertriglyceridemia due to propofol infusion Propofol was  stopped Continue Versed Repeat triglyceride level  DNR status    Best Practice: (right click and "Reselect all SmartList Selections" daily)   Diet/type: NPO DVT prophylaxis: SCD GI prophylaxis: H2B Lines: Central line, Arterial Line, and yes and it is still needed Foley:  Yes, and it is still needed Code Status:  DNR Last date of multidisciplinary goals of care discussion: 07-02-22 patient family was updated at bedside considering being on multiple vasopressor support, unable to tolerate CRRT and getting hypotensive, high likelihood he will go into cardiac arrest, patient's family decided to proceed with comfort care after all family member comes and visit  Labs:  CBC: Recent Labs  Lab 06/11/2022 0349 06/06/2022 0407 06/06/22 0401 06/06/22 0747 06/06/22 0753 06/07/22 0440 07-02-2022 0028 07/02/2022 0406  WBC 17.9*  --  21.2*  --   --  21.0*  --  9.8  HGB 11.4*   < > 11.4* 11.6* 11.2* 10.8* 10.9* 10.9*  HCT 35.8*   < > 34.3* 34.0* 33.0* 32.5* 32.0* 33.3*  MCV  97.8  --  92.2  --   --  94.2  --  95.7  PLT 119*  --  70*  --   --  72*  --  63*   < > = values in this interval not displayed.   Basic Metabolic Panel: Recent Labs  Lab 06/06/22 0401 06/06/22 0747 06/06/22 1041 06/07/22 0440 06/07/22 1558 19-Jun-2022 0028 06/19/2022 0406  NA 140   < > 139 141 139 139 138  K 3.6   < > 3.8 4.2 4.4 4.8 4.7  CL 95*  --  93* 97* 100  --  101  CO2 30  --  '30 26 25  '$ --  23  GLUCOSE 155*  --  126* 119* 128*  --  117*  BUN 53*  --  58* 65* 58*  --  38*  CREATININE 4.28*  --  4.63* 5.39* 4.15*  --  3.03*  CALCIUM 7.5*  --  7.6* 7.1* 6.7*  --  7.1*  MG 2.4  --  2.2  --   --   --  2.3  PHOS 7.0*  --   --   --  6.7*  --  4.8*   < > = values in this interval not displayed.   GFR: Estimated Creatinine Clearance: 22.1 mL/min (A) (by C-G formula based on SCr of 3.03 mg/dL (H)). Recent Labs  Lab 06/09/2022 0349 06/17/2022 0629 06/07/2022 1105 05/20/2022 1536 06/06/22 0401 06/06/22 0700 06/07/22 0101  06/07/22 0440 Jun 19, 2022 0406  WBC 17.9*  --   --   --  21.2*  --   --  21.0* 9.8  LATICACIDVEN 6.3*   < > >9.0* >9.0*  --  3.1* 1.4  --   --    < > = values in this interval not displayed.   Liver Function Tests: Recent Labs  Lab 05/23/2022 0349 05/21/2022 1105 06/06/22 1041 06/07/22 0440 06/07/22 1558 06-19-2022 0406  AST 170* 2,524* 5,861* 2,099*  --   --   ALT 80* 1,183* 1,719* 1,229*  --   --   ALKPHOS 28* 40 48 49  --   --   BILITOT 1.3* 1.8* 3.8* 4.6*  --   --   PROT 5.3* 4.7* 4.5* 4.5*  --   --   ALBUMIN 2.4* 2.0* 2.0* 1.9* 1.9* 1.9*   No results for input(s): "LIPASE", "AMYLASE" in the last 168 hours. No results for input(s): "AMMONIA" in the last 168 hours.  ABG:    Component Value Date/Time   PHART 7.305 (L) 2022/06/19 0028   PCO2ART 53.7 (H) 19-Jun-2022 0028   PO2ART 78 (L) 2022-06-19 0028   HCO3 26.8 06/19/2022 0028   TCO2 28 2022-06-19 0028   ACIDBASEDEF 7.0 (H) 06/19/2022 1129   O2SAT 76.2 06-19-22 0841    Coagulation Profile: Recent Labs  Lab 05/21/2022 0349 06/06/22 0156 06/07/22 0440  INR 2.4* 4.9* 3.1*   Cardiac Enzymes: No results for input(s): "CKTOTAL", "CKMB", "CKMBINDEX", "TROPONINI" in the last 168 hours.  HbA1C: Hgb A1c MFr Bld  Date/Time Value Ref Range Status  06/09/2022 07:30 AM 5.8 (H) 4.8 - 5.6 % Final    Comment:    (NOTE) Pre diabetes:          5.7%-6.4%  Diabetes:              >6.4%  Glycemic control for   <7.0% adults with diabetes     CBG: Recent Labs  Lab 06/07/22 1536 06/07/22 2003 Jun 19, 2022 1017 06/19/2022 0413 06-19-2022 0759  GLUCAP 143* 121* 130* 130* 104*   This patient is critically ill with multiple organ system failure which requires frequent high complexity decision making, assessment, support, evaluation, and titration of therapies. This was completed through the application of advanced monitoring technologies and extensive interpretation of multiple databases.  During this encounter critical care time was  devoted to patient care services described in this note for 44 minutes.    Jacky Kindle, MD [facility] See Amion for pager If no response to pager, please call [phone] until 7pm After 7pm, Please call E-link [phone]

## 2022-06-20 NOTE — Progress Notes (Addendum)
At approximately 0000, RN in room and patient became agitated. Due to recent events and patient having an inability to tolerate his agitation, 50 mcg of fentanyl was given via pump bolus. A few minutes later, BP dropped as low as 01'M systolic. E-Link Emergency Button pressed and RN on screen. Drips were titrated accordingly to try and increase BP. MD on screen shortly after and phenylephrine ordered, as well as being told to increase versed so patient would be sedated. MD stated "do what you have to so you can keep him sedated" in regards to level of versed needed.

## 2022-06-20 NOTE — Progress Notes (Signed)
Advanced Heart Failure Rounding Note  PCP-Cardiologist: Skeet Latch, MD   Subjective:     Remains intubated. Will awaken some  Remains hypotensive despite max dose pressors.   Anuric. On CVVHD  Keeping even.   Objective:   Weight Range: 88.6 kg Body mass index is 28.03 kg/m.   Vital Signs:   Temp:  [96.4 F (35.8 C)-100 F (37.8 C)] 98.4 F (36.9 C) 06-28-2022 1000) Pulse Rate:  [61-150] 132 28-Jun-2022 0900) Resp:  [15-20] 15 06-28-2022 1000) BP: (71-156)/(32-79) 89/52 06-28-2022 1000) SpO2:  [94 %-100 %] 99 % 2022-06-28 0900) Arterial Line BP: (63-246)/(-34-46) 90/32 Jun 28, 2022 1000) FiO2 (%):  [40 %] 40 % 28-Jun-2022 0801) Weight:  [88.6 kg] 88.6 kg June 28, 2022 0500) Last BM Date : 06/05/21  Weight change: Filed Weights   06/06/22 0500 06/07/22 0428 2022-06-28 0500  Weight: 82 kg 87 kg 88.6 kg    Intake/Output:   Intake/Output Summary (Last 24 hours) at 2022-06-28 1104 Last data filed at 06/28/22 0900 Gross per 24 hour  Intake 3151.39 ml  Output 3092 ml  Net 59.39 ml     Physical Exam    General:  Intubated ill-appearing HEENT: normal + ETT Neck: supple. RIJ TLC LIJ HD cath Cor: irregular tachy Lungs: coarse Abdomen: soft, nontender, nondistended. No hepatosplenomegaly. No bruits or masses. Good bowel sounds. Extremities: mottled cool  Neuro: sedated will arouse to voice  Telemetry   SR 130-140s Personally reviewed  Labs    CBC Recent Labs    06/07/22 0440 June 28, 2022 0028 06/28/22 0406  WBC 21.0*  --  9.8  HGB 10.8* 10.9* 10.9*  HCT 32.5* 32.0* 33.3*  MCV 94.2  --  95.7  PLT 72*  --  63*    Basic Metabolic Panel Recent Labs    06/06/22 1041 06/07/22 0440 06/07/22 1558 28-Jun-2022 0028 06/28/22 0406  NA 139   < > 139 139 138  K 3.8   < > 4.4 4.8 4.7  CL 93*   < > 100  --  101  CO2 30   < > 25  --  23  GLUCOSE 126*   < > 128*  --  117*  BUN 58*   < > 58*  --  38*  CREATININE 4.63*   < > 4.15*  --  3.03*  CALCIUM 7.6*   < > 6.7*  --  7.1*  MG 2.2  --    --   --  2.3  PHOS  --   --  6.7*  --  4.8*   < > = values in this interval not displayed.    Liver Function Tests Recent Labs    06/06/22 1041 06/07/22 0440 06/07/22 1558 Jun 28, 2022 0406  AST 5,861* 2,099*  --   --   ALT 1,719* 1,229*  --   --   ALKPHOS 48 49  --   --   BILITOT 3.8* 4.6*  --   --   PROT 4.5* 4.5*  --   --   ALBUMIN 2.0* 1.9* 1.9* 1.9*    No results for input(s): "LIPASE", "AMYLASE" in the last 72 hours. Cardiac Enzymes No results for input(s): "CKTOTAL", "CKMB", "CKMBINDEX", "TROPONINI" in the last 72 hours.  BNP: BNP (last 3 results) Recent Labs    06/14/2022 0238 06/06/22 0401 06/07/22 0440  BNP 1,545.5* 3,856.6* 4,124.0*     ProBNP (last 3 results) No results for input(s): "PROBNP" in the last 8760 hours.   D-Dimer No results for input(s): "DDIMER" in  the last 72 hours. Hemoglobin A1C No results for input(s): "HGBA1C" in the last 72 hours.  Fasting Lipid Panel Recent Labs    22-Jun-2022 0406  TRIG 229*    Thyroid Function Tests No results for input(s): "TSH", "T4TOTAL", "T3FREE", "THYROIDAB" in the last 72 hours.  Invalid input(s): "FREET3"  Other results:   Imaging    No results found.   Medications:     Scheduled Medications:  Chlorhexidine Gluconate Cloth  6 each Topical Daily   docusate  100 mg Per Tube BID   famotidine  20 mg Per Tube QHS   feeding supplement (PROSource TF20)  60 mL Per Tube Q4H   feeding supplement (VITAL 1.5 CAL)  1,000 mL Per Tube Q24H   fluticasone furoate-vilanterol  1 puff Inhalation Daily   insulin aspart  1-3 Units Subcutaneous Q4H   multivitamin  1 tablet Per Tube QHS   mouth rinse  15 mL Mouth Rinse Q2H   polyethylene glycol  17 g Per Tube Daily   QUEtiapine  50 mg Oral QHS   sodium chloride flush  10-40 mL Intracatheter Q12H    Infusions:   prismasol BGK 4/2.5 400 mL/hr at 06/22/2022 0016    prismasol BGK 4/2.5 400 mL/hr at Jun 22, 2022 0020   sodium chloride 250 mL (06/07/22 1210)    epinephrine 20 mcg/min (06/22/22 0900)   fentaNYL infusion INTRAVENOUS 50 mcg/hr (22-Jun-2022 0900)   heparin 950 Units/hr (June 22, 2022 0900)   meropenem (MERREM) IV Stopped (June 22, 2022 0555)   midazolam Stopped (06/22/2022 0751)   norepinephrine (LEVOPHED) Adult infusion 40 mcg/min (22-Jun-2022 0900)   prismasol BGK 4/2.5 1,500 mL/hr at 2022-06-22 4097   vasopressin 0.03 Units/min (06-22-2022 0900)    PRN Medications: acetaminophen (TYLENOL) oral liquid 160 mg/5 mL, clonazePAM, fentaNYL (SUBLIMAZE) injection, fentaNYL (SUBLIMAZE) injection, heparin, mouth rinse, oxyCODONE, sodium chloride, sodium chloride flush     Assessment/Plan   1. PEA/Bradycardic/VT arrest -Initially transcutaneously placed. Developed PEA arrest as well as episodes of VT for which he was defibrillated - s/p emergent temporary pacer  - now with rhythm recovery with clearing of acidosis.   2. Shock, likely cardiogenic and septic: -In setting of complete heart block followed by cardiac arrest requiring temporary pacer - Complicated by severe baseline AS - Echo 06/04/2022 EF 60-65% severe AS RV ok  - Remains hypotensive despite max pressors    3. AKI on CKD IIIb due to ATN/shock: -Baseline Scr 1.5-1.8 -Scr 3.67 -> 4.3 ->5.4  -CVVHD started 06/07/22   4. Severe aortic valve stenosis: -Echo 09/23: EF 60-65%, moderate LVH, RV okay, severe AS with mean gradient 37 mmhg, AVA 0.8 cm2 and DI 0.24   5. Acute respiratory failure with hypoxia -Intubated, vent management per CCM   6. R pneumothorax due to CPR - now with chest tube  7. Shock liver -In setting of shock - trend LFTs - avoid hepatotoxic agents   8. 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 I did not find prior hx of Afib in record - Heparin gt  9. Elevated LFTs/shock liver LFTs trending down  He has refractory hypotension despite max pressor support. Family requesting comfort care. Agree that this is the most appropriate course.   Will proceed  with terminal extubation when full family present.   CRITICAL CARE Performed by: Glori Bickers  Total critical care time: 45 minutes  Critical care time was exclusive of separately billable procedures and treating other patients.  Critical care was necessary to treat or prevent imminent or  life-threatening deterioration.  Critical care was time spent personally by me (independent of midlevel providers or residents) on the following activities: development of treatment plan with patient and/or surrogate as well as nursing, discussions with consultants, evaluation of patient's response to treatment, examination of patient, obtaining history from patient or surrogate, ordering and performing treatments and interventions, ordering and review of laboratory studies, ordering and review of radiographic studies, pulse oximetry and re-evaluation of patient's condition.     Length of Stay: 3  Glori Bickers, MD  2022/07/06, 11:04 AM  Advanced Heart Failure Team Pager [phone] (M-F; 7a - 5p)  Please contact [facility] for night-coverage after hours (5p -7a ) and weekends on [email or web address]

## 2022-06-20 NOTE — Progress Notes (Signed)
ANTICOAGULATION CONSULT NOTE - Follow Up Consult  Pharmacy Consult for Heparin (Apixaban on hold) Indication:  History of PE  Allergies  Allergen Reactions   Cefaclor Other (See Comments)     skin split    Patient Measurements: Height: '5\' 10"'$  (177.8 cm) Weight: 88.6 kg (195 lb 5.2 oz) IBW/kg (Calculated) : 73  Vital Signs: Temp: 96.6 F (35.9 C) Jun 12, 2022 0630) BP: 96/79 Jun 12, 2022 0600) Pulse Rate: 77 06-12-22 0630)  Labs: Recent Labs    06/06/22 0156 06/06/22 0401 06/06/22 0747 06/06/22 1041 06/07/22 0440 06/07/22 1558 June 12, 2022 0028 06/12/2022 0406  HGB  --  11.4*   < >  --  10.8*  --  10.9* 10.9*  HCT  --  34.3*   < >  --  32.5*  --  32.0* 33.3*  PLT  --  70*  --   --  72*  --   --  63*  APTT 121*  --   --  117* 160*  --   --  147*  LABPROT 45.2*  --   --   --  31.6*  --   --   --   INR 4.9*  --   --   --  3.1*  --   --   --   HEPARINUNFRC 0.88*  --   --  0.71* 0.55  --   --  0.42  CREATININE  --  4.28*  --  4.63* 5.39* 4.15*  --  3.03*   < > = values in this interval not displayed.     Estimated Creatinine Clearance: 22.1 mL/min (A) (by C-G formula based on SCr of 3.03 mg/dL (H)).   Medical History: Past Medical History:  Diagnosis Date   Anxiety    reports scotch helps that    Aortic stenosis, mild 08/18/2018   Mean gradient 14 mmHg 04/2018   Arthritis    " it's everywhere"   Blood transfusion without reported diagnosis    Cataract    having cataract surgery July 2016   Chronic bronchitis ([place])    Chronic kidney disease    hx kidney stones   Colon polyps    Complication of anesthesia    after surgery 07-2013 unable to sleep for 3 days   COPD (chronic obstructive pulmonary disease) ([place])    Coronary artery calcification seen on CAT scan 08/18/2018   GERD (gastroesophageal reflux disease)    History of kidney stones    Hyperlipidemia    Hypertension    Prostate hypertrophy    Pulmonary embolism ([place]) 12/2014   post TJR   Seasonal allergies    Skin  cancer    squamous cell, 1 melanoma left shoulder, one episode- under tongue, has had a total of 17-18 episodes    Sleep apnea    had surgery in 2000 for sleep apnea, no CPAP needed   Thoracic aortic aneurysm ([place]) 05/04/2019   4.6 cm 04/2019     Assessment: 82 y/o M in the ED with bradycardia. On apixaban PTA for history of PE. Holding apixaban and starting heparin for now. Last dose of apixaban was 1/16 at 1400, ok to start heparin for now. Anticipate using aPTT to dose.   Heparin level came back therapeutic at 0.42 (aPTT came back at 147 - will disregard and stop monitoring). Hgb 10.9, plt trending down to 63. No s/sx of bleeding or infusion issues. Started on CRRT yesterday.  Goal of Therapy:  Heparin level 0.3-0.7 units/ml Monitor platelets by anticoagulation protocol:  Yes   Plan:  Continue heparin infusion at 950 units/hr Daily CBC/Heparin level Monitor for bleeding  Antonietta Jewel, PharmD, [facility]  Phone: [phone] June 27, 2022 7:18 AM  Please check AMION for all [place] phone numbers After 10:00 PM, call [place] [phone]

## 2022-06-20 NOTE — Progress Notes (Signed)
Patient ID: [name], male   DOB: 1940/11/25, 82 y.o.   MRN: [id number] S: Pt seen and examined while on CRRT.  Labs and orders reviewed and adjustments made as indicated.  He has had ongoing issues with hypotension and sedation.  Unable to UF much due to increasing pressor requirements. O:BP 93/69   Pulse (!) 143   Temp (!) 97.3 F (36.3 C)   Resp 17   Ht '5\' 10"'$  (1.778 m)   Wt 88.6 kg   SpO2 94%   BMI 28.03 kg/m   Intake/Output Summary (Last 24 hours) at 06/16/22 0906 Last data filed at 06-16-2022 0700 Gross per 24 hour  Intake 3086.77 ml  Output 3357 ml  Net -270.23 ml   Intake/Output: I/O last 3 completed shifts: In: 4339.7 [I.V.:3598.7; NG/GT:441; IV Piggyback:300] Out: 9326 [Urine:303; Emesis/NG output:900; Chest Tube:180]  Intake/Output this shift:  No intake/output data recorded. Weight change: 1.6 kg Gen: intubated and sedated CVS: tachycardic at 143 Resp:ventilated BS bilaterally Abd:+BS, soft, NT/ND Ext: trace pretibial and presacral edema 1 + edema of hands, cyanosis of toes bilaterally  Recent Labs  Lab 05/28/2022 0349 06/19/2022 0407 05/25/2022 0752 06/04/2022 1105 06/06/2022 1129 06/06/22 0401 06/06/22 0747 06/06/22 0753 06/06/22 1041 06/07/22 0440 06/07/22 1558 06/16/22 0028 06/16/22 0406  NA 133* 134*   < > 146*   < > 140 139 139 139 141 139 139 138  K 4.6 4.5   < > 4.0   < > 3.6 3.6 3.6 3.8 4.2 4.4 4.8 4.7  CL 99 100  --  99  --  95*  --   --  93* 97* 100  --  101  CO2 19*  --   --  18*  --  30  --   --  '30 26 25  '$ --  23  GLUCOSE 165* 161*  --  141*  --  155*  --   --  126* 119* 128*  --  117*  BUN 45* 39*  --  45*  --  53*  --   --  58* 65* 58*  --  38*  CREATININE 3.67* 3.60*  --  3.83*  --  4.28*  --   --  4.63* 5.39* 4.15*  --  3.03*  ALBUMIN 2.4*  --   --  2.0*  --   --   --   --  2.0* 1.9* 1.9*  --  1.9*  CALCIUM 8.1*  --   --  8.4*  --  7.5*  --   --  7.6* 7.1* 6.7*  --  7.1*  PHOS  --   --   --   --   --  7.0*  --   --   --   --  6.7*  --   4.8*  AST 170*  --   --  2,524*  --   --   --   --  7,124* 2,099*  --   --   --   ALT 80*  --   --  1,183*  --   --   --   --  1,719* 1,229*  --   --   --    < > = values in this interval not displayed.   Liver Function Tests: Recent Labs  Lab 05/22/2022 1105 06/06/22 1041 06/07/22 0440 06/07/22 1558 2022-06-16 0406  AST 2,524* 5,861* 2,099*  --   --   ALT 1,183* 1,719* 1,229*  --   --  ALKPHOS 40 48 49  --   --   BILITOT 1.8* 3.8* 4.6*  --   --   PROT 4.7* 4.5* 4.5*  --   --   ALBUMIN 2.0* 2.0* 1.9* 1.9* 1.9*   No results for input(s): "LIPASE", "AMYLASE" in the last 168 hours. No results for input(s): "AMMONIA" in the last 168 hours. CBC: Recent Labs  Lab 06/12/2022 0349 06/16/2022 0407 06/06/22 0401 06/06/22 0747 06/07/22 0440 06/22/2022 0028 2022-06-22 0406  WBC 17.9*  --  21.2*  --  21.0*  --  9.8  HGB 11.4*   < > 11.4*   < > 10.8* 10.9* 10.9*  HCT 35.8*   < > 34.3*   < > 32.5* 32.0* 33.3*  MCV 97.8  --  92.2  --  94.2  --  95.7  PLT 119*  --  70*  --  72*  --  63*   < > = values in this interval not displayed.   Cardiac Enzymes: No results for input(s): "CKTOTAL", "CKMB", "CKMBINDEX", "TROPONINI" in the last 168 hours. CBG: Recent Labs  Lab 06/07/22 1536 06/07/22 2003 2022-06-22 0027 2022/06/22 0413 June 22, 2022 0759  GLUCAP 143* 121* 130* 130* 104*    Iron Studies: No results for input(s): "IRON", "TIBC", "TRANSFERRIN", "FERRITIN" in the last 72 hours. Studies/Results: DG CHEST PORT 1 VIEW  Result Date: 06/07/2022 CLINICAL DATA:  Central line placement EXAM: PORTABLE CHEST 1 VIEW COMPARISON:  Chest x-ray June 06, 2022 FINDINGS: Interval placement of an additional left approach central venous catheter, with the tip projecting over the upper SVC. Stable positioning of the endotracheal tube, NG tube, second left approach central venous catheter, and right transvenous pacing line. Stable positioning of right pleural pigtail catheter. Stable cardiomediastinal contours.  Unchanged right basilar opacity. Increased left heterogeneous basilar opacity and new small left pleural effusion. No large pneumothorax. No acute osseous abnormality. The visualized upper abdomen unremarkable. IMPRESSION: 1. Interval placement of a left approach central venous catheter, terminating in the upper SVC. 2. Increased left heterogeneous basilar pulmonary opacity and new small left pleural effusion. Unchanged right basilar pulmonary opacity. Electronically Signed   By: Beryle Flock M.D.   On: 06/07/2022 11:01   ECHOCARDIOGRAM LIMITED  Result Date: 06/06/2022    ECHOCARDIOGRAM LIMITED REPORT   Patient Name:   [name] Date of Exam: 06/06/2022 Medical Rec #:  [id number]     Height:       70.0 in Accession #:    [id number]    Weight:       180.8 lb Date of Birth:  13-Feb-1941      BSA:          2.000 m Patient Age:    89 years      BP:           111/93 mmHg Patient Gender: M             HR:           69 bpm. Exam Location:  Inpatient Procedure: Limited Echo, Color Doppler and Cardiac Doppler Indications:    complete heart block. Aortic stenosis and regurgitation.  History:        Patient has prior history of Echocardiogram examinations, most                 recent 01/24/2022. Traumatic pneumothorax. Chronic kidney disease,                 Aortic Valve Disease; Risk Factors:Sleep Apnea, Hypertension  and                 Dyslipidemia.  Sonographer:    [name] RDCS Referring Phys: [id number] [facility] HENDERSON  Sonographer Comments: Echo performed with patient supine and on artificial respirator. Image acquisition challenging due to uncooperative patient and Image acquisition challenging due to respiratory motion. IMPRESSIONS  1. Left ventricular ejection fraction, by estimation, is 65 to 70%. The left ventricle has normal function. The left ventricle has no regional wall motion abnormalities.  2. Right ventricular systolic function is normal. The right ventricular size is normal.  3. Mild mitral  valve regurgitation. Moderate to severe mitral annular calcification.  4. AVA ~ >1 cm2, DI is normal as well. AV gradient 42 mmhg in the severe range, heart is hyperdynamic. Mod-severe AS. There is severe calcifcation of the aortic valve. Moderate to severe aortic valve stenosis.  5. The inferior vena cava is dilated in size with >50% respiratory variability, suggesting right atrial pressure of 8 mmHg. Conclusion(s)/Recommendation(s): Compared to prior study 01/24/2022, less MR. FINDINGS  Left Ventricle: Left ventricular ejection fraction, by estimation, is 65 to 70%. The left ventricle has normal function. The left ventricle has no regional wall motion abnormalities. Right Ventricle: The right ventricular size is normal. Right ventricular systolic function is normal. Pericardium: There is no evidence of pericardial effusion. Mitral Valve: Moderate to severe mitral annular calcification. Mild mitral valve regurgitation. MV peak gradient, 7.7 mmHg. The mean mitral valve gradient is 3.0 mmHg. Tricuspid Valve: Tricuspid valve regurgitation is not demonstrated. Aortic Valve: AVA ~ >1 cm2, DI is normal as well. AV gradient 42 mmhg in the severe range, heart is hyperdynamic. Mod-severe AS. There is severe calcifcation of the aortic valve. Aortic regurgitation PHT measures 259 msec. Moderate to severe aortic stenosis is present. Aortic valve mean gradient measures 42.0 mmHg. Aortic valve peak gradient measures 77.4 mmHg. Venous: The inferior vena cava is dilated in size with greater than 50% respiratory variability, suggesting right atrial pressure of 8 mmHg. Additional Comments: Spectral Doppler performed. Color Doppler performed.  IVC IVC diam: 2.80 cm AORTIC VALVE AV Vmax:           440.00 cm/s AV Vmean:          298.000 cm/s AV VTI:            0.698 m AV Peak Grad:      77.4 mmHg AV Mean Grad:      42.0 mmHg LVOT Vmax:         149.50 cm/s LVOT Vmean:        100.400 cm/s LVOT VTI:          0.276 m LVOT/AV VTI ratio: 0.40  AI PHT:            259 msec MITRAL VALVE MV Peak grad: 7.7 mmHg  SHUNTS MV Mean grad: 3.0 mmHg  Systemic VTI: 0.28 m MV Vmax:      1.39 m/s MV Vmean:     74.2 cm/s Placido Sou signed by Phineas Inches Signature Date/Time: 06/06/2022/10:55:25 AM    Final     Chlorhexidine Gluconate Cloth  6 each Topical Daily   docusate  100 mg Per Tube BID   famotidine  20 mg Per Tube QHS   feeding supplement (PROSource TF20)  60 mL Per Tube Q4H   feeding supplement (VITAL 1.5 CAL)  1,000 mL Per Tube Q24H   fluticasone furoate-vilanterol  1 puff Inhalation Daily   insulin aspart  1-3 Units Subcutaneous Q4H   multivitamin  1 tablet Per Tube QHS   mouth rinse  15 mL Mouth Rinse Q2H   polyethylene glycol  17 g Per Tube Daily   QUEtiapine  50 mg Oral QHS   sodium chloride flush  10-40 mL Intracatheter Q12H    BMET    Component Value Date/Time   NA 138 06-22-2022 0406   NA 140 04/08/2022 0815   K 4.7 2022/06/22 0406   CL 101 22-Jun-2022 0406   CO2 23 06-22-2022 0406   GLUCOSE 117 (H) 06-22-2022 0406   BUN 38 (H) Jun 22, 2022 0406   BUN 27 04/08/2022 0815   CREATININE 3.03 (H) 2022/06/22 0406   CALCIUM 7.1 (L) 06/22/22 0406   GFRNONAA 20 (L) 06-22-2022 0406   GFRAA >60 12/04/2019 0321   CBC    Component Value Date/Time   WBC 9.8 06/22/22 0406   RBC 3.48 (L) 06/22/2022 0406   HGB 10.9 (L) Jun 22, 2022 0406   HGB 13.8 03/18/2022 0855   HCT 33.3 (L) 06/22/2022 0406   HCT 42.2 03/18/2022 0855   PLT 63 (L) 22-Jun-2022 0406   PLT 163 03/18/2022 0855   MCV 95.7 2022-06-22 0406   MCV 94 03/18/2022 0855   MCH 31.3 22-Jun-2022 0406   MCHC 32.7 2022-06-22 0406   RDW 15.1 June 22, 2022 0406   RDW 14.1 03/18/2022 0855   LYMPHSABS 2.0 03/13/2016 1608   MONOABS 1.0 03/13/2016 1608   EOSABS 0.2 03/13/2016 1608   BASOSABS 0.1 03/13/2016 1608    Assessment/Plan:   AKI/CKD stage IIIb - presumably due to ischemic ATN in setting of PEA arrest.  Remains oliguric/anuric and worsening BUN/Cr.  PCCM  placed temp RIJ HD catheter and initiated CRRT 06/07/22.  Prescription: CRRT 4K/2.5Ca baths, pre and post-filter 400 mL/hr, dialysate 1500 mL/hr, UF 50-150 mL/hr as bp tolerates.    Avoid nephrotoxic medications including NSAIDs and iodinated intravenous contrast exposure unless the latter is absolutely indicated.   Preferred narcotic agents for pain control are hydromorphone, fentanyl, and methadone. Morphine should not be used.  Avoid Baclofen and avoid oral sodium phosphate and magnesium citrate based laxatives / bowel preps.  Continue strict Input and Output monitoring.  Will monitor the patient closely with you and intervene or adjust therapy as indicated by changes in clinical status/labs  Cardiogenic shock - likely with septic component.  Continue with pressor support PEA arrest followed by VT - per PCCM and Cardiology E. Coli UTI - abx per PCCM Acute hypoxic respiratory failure - on vent per PCCM Right pneumothorax due to CPR - s/p chest tube CAP - antibiotics per primary svc Shock liver - improving. Anemia of CKD - stable Thrombocytopenia Disposition - poor overall prognosis.  Recurrent episodes of hypotension despite 3 pressors.  Family at bedside.  Will continue with CRRT for now, however if he cannot tolerate it, would recommend transition to comfort care.  Donetta Potts, MD [facility]

## 2022-06-20 DEATH — deceased

## 2022-07-11 ENCOUNTER — Ambulatory Visit (HOSPITAL_BASED_OUTPATIENT_CLINIC_OR_DEPARTMENT_OTHER): Payer: Medicare Other | Admitting: Cardiovascular Disease

## 2022-07-19 NOTE — Death Summary Note (Signed)
DEATH SUMMARY   Patient Details  Name: [name] MRN: [id number] DOB: 03-15-1941  Admission/Discharge Information   Admit Date:  Jun 11, 2022  Date of Death: Date of Death: 06/14/22  Time of Death: Time of Death: 08-05-28  Length of Stay: 3  Referring Physician: Shon Baton, MD   Reason(s) for Hospitalization  PEA arrest followed by VT  Acute septic-metabolic encephalopathy Intermittent agitation  Acute respiratory failure with hypoxia and hypercarbia Probable aspiration PNA R pneumothorax due to CPR s/p anterior chest tube Pulmonary edema  Left pleural effusion  Septic shock 2/2 E.coli UTI, PNA, Staph epi bacteremia  Lactic acidosis Cardiogenic shock Complete heart block  Severe AS Demand ischemia  AKI on CKD 3b, ischemic/septic ATN  Acute on chronic urinary retention Shock liver in the setting of cardiac arrest Coagulopathy  Anemia Thrombocytopenia  Hypertriglyceridemia due to propofol infusion   Diagnoses  Preliminary cause of death: Withdrawal of care in the setting of multisystem organ failure Secondary Diagnoses (including complications and co-morbidities):  Principal Problem:   Heart block AV complete ([place]) Active Problems:   Peripheral vascular disease (HCC)   Aortic stenosis, severe   Thoracic aortic aneurysm (HCC)   Complete heart block (HCC)   Cardiac arrest, cause unspecified ([place])   Heart block   AKI (acute kidney injury) ([place])   Shock ([place])   Pneumothorax, traumatic   Acute respiratory failure with hypoxia ([place])   Septic shock ([place])   Cardiogenic shock ([place])   Cardiac arrest ([place])   Acute renal failure with tubular necrosis (HCC)   Coagulopathy ([place])   Brief Hospital Course (including significant findings, care, treatment, and services provided and events leading to death)  [name] is a 82 y.o. year old male with history of provoked PE, mild COPD, OSA, severe AS who presented with acute SOB and weakness that began last evening. He has a  mild chronic cough that is unchanged. With EMS he was noted to have a heart rate in the 30s and hypotension, as low as 42/20. He was stared on epi and atropine in the field. He was TC paced in the ED with an appropriate response. Cardiology evaluated the patient at bedside and stopped TC pacing and felt that he was a poor candidate for TV pacing. CXR concerning for possible R pneumonia, and he was given fluids and antibiotics for sepsis after blood cultures were collected. He denies change in baseline cough. He has been compliant with his inhalers. He has increase SOB that began suddenly last night. He denies CP. Recently he has had a decreased appetite since treatment for a UTI. He complaints today of feeling thirsty.     After admission but while still awaiting ICU bed (in ED) pt had cardiac arrest. Initially PEA, then VT. He had a TVP placed emergently 11-Jun-2022 after ROSC   Patient was admitted to ICU, he remained on full support mechanical ventilator, hypercapnia was cleared with adjustment of vent setting, he was started on broad-spectrum antibiotics, he went into renal failure requiring CRRT, intermittently patient was getting agitated requiring multiple medication.  Urine culture grew E. coli and blood culture showed staph bacteremia, he was on antibiotics with meropenem, he was requiring multiple vasopressor support due to cardiogenic shock and complete heart block, TVP was in place patient did have history of severe aortic stenosis, with current condition he was not a candidate for surgical intervention, he was not shock liver due to severe cardiogenic/septic shock. With progressive decline in multisystem organ failure, goals  of care discussions were carried with family, patient was made DNR per patient's family request, as his condition was progressively getting worse, patient's family decided to proceed with palliative extubation and comfort care. Patient was placed on comfort care, he passed on  02-Jul-2022 at 11:30 AM.  Patient's family was at bedside  Pertinent Labs and Studies  Significant Diagnostic Studies DG CHEST PORT 1 VIEW  Result Date: 06/07/2022 CLINICAL DATA:  Central line placement EXAM: PORTABLE CHEST 1 VIEW COMPARISON:  Chest x-ray June 06, 2022 FINDINGS: Interval placement of an additional left approach central venous catheter, with the tip projecting over the upper SVC. Stable positioning of the endotracheal tube, NG tube, second left approach central venous catheter, and right transvenous pacing line. Stable positioning of right pleural pigtail catheter. Stable cardiomediastinal contours. Unchanged right basilar opacity. Increased left heterogeneous basilar opacity and new small left pleural effusion. No large pneumothorax. No acute osseous abnormality. The visualized upper abdomen unremarkable. IMPRESSION: 1. Interval placement of a left approach central venous catheter, terminating in the upper SVC. 2. Increased left heterogeneous basilar pulmonary opacity and new small left pleural effusion. Unchanged right basilar pulmonary opacity. Electronically Signed   By: Beryle Flock M.D.   On: 06/07/2022 11:01   ECHOCARDIOGRAM LIMITED  Result Date: 06/06/2022    ECHOCARDIOGRAM LIMITED REPORT   Patient Name:   [name] Date of Exam: 06/06/2022 Medical Rec #:  [id number]     Height:       70.0 in Accession #:    [id number]    Weight:       180.8 lb Date of Birth:  26-Mar-1941      BSA:          2.000 m Patient Age:    82 years      BP:           111/93 mmHg Patient Gender: M             HR:           69 bpm. Exam Location:  Inpatient Procedure: Limited Echo, Color Doppler and Cardiac Doppler Indications:    complete heart block. Aortic stenosis and regurgitation.  History:        Patient has prior history of Echocardiogram examinations, most                 recent 01/24/2022. Traumatic pneumothorax. Chronic kidney disease,                 Aortic Valve Disease; Risk Factors:Sleep  Apnea, Hypertension and                 Dyslipidemia.  Sonographer:    [name] RDCS Referring Phys: [id number] [facility] H HENDERSON  Sonographer Comments: Echo performed with patient supine and on artificial respirator. Image acquisition challenging due to uncooperative patient and Image acquisition challenging due to respiratory motion. IMPRESSIONS  1. Left ventricular ejection fraction, by estimation, is 65 to 70%. The left ventricle has normal function. The left ventricle has no regional wall motion abnormalities.  2. Right ventricular systolic function is normal. The right ventricular size is normal.  3. Mild mitral valve regurgitation. Moderate to severe mitral annular calcification.  4. AVA ~ >1 cm2, DI is normal as well. AV gradient 42 mmhg in the severe range, heart is hyperdynamic. Mod-severe AS. There is severe calcifcation of the aortic valve. Moderate to severe aortic valve stenosis.  5. The inferior vena cava is dilated in size with >50%  respiratory variability, suggesting right atrial pressure of 8 mmHg. Conclusion(s)/Recommendation(s): Compared to prior study 01/24/2022, less MR. FINDINGS  Left Ventricle: Left ventricular ejection fraction, by estimation, is 65 to 70%. The left ventricle has normal function. The left ventricle has no regional wall motion abnormalities. Right Ventricle: The right ventricular size is normal. Right ventricular systolic function is normal. Pericardium: There is no evidence of pericardial effusion. Mitral Valve: Moderate to severe mitral annular calcification. Mild mitral valve regurgitation. MV peak gradient, 7.7 mmHg. The mean mitral valve gradient is 3.0 mmHg. Tricuspid Valve: Tricuspid valve regurgitation is not demonstrated. Aortic Valve: AVA ~ >1 cm2, DI is normal as well. AV gradient 42 mmhg in the severe range, heart is hyperdynamic. Mod-severe AS. There is severe calcifcation of the aortic valve. Aortic regurgitation PHT measures 259 msec. Moderate to severe  aortic stenosis is present. Aortic valve mean gradient measures 42.0 mmHg. Aortic valve peak gradient measures 77.4 mmHg. Venous: The inferior vena cava is dilated in size with greater than 50% respiratory variability, suggesting right atrial pressure of 8 mmHg. Additional Comments: Spectral Doppler performed. Color Doppler performed.  IVC IVC diam: 2.80 cm AORTIC VALVE AV Vmax:           440.00 cm/s AV Vmean:          298.000 cm/s AV VTI:            0.698 m AV Peak Grad:      77.4 mmHg AV Mean Grad:      42.0 mmHg LVOT Vmax:         149.50 cm/s LVOT Vmean:        100.400 cm/s LVOT VTI:          0.276 m LVOT/AV VTI ratio: 0.40 AI PHT:            259 msec MITRAL VALVE MV Peak grad: 7.7 mmHg  SHUNTS MV Mean grad: 3.0 mmHg  Systemic VTI: 0.28 m MV Vmax:      1.39 m/s MV Vmean:     74.2 cm/s Phineas Inches Electronically signed by Phineas Inches Signature Date/Time: 06/06/2022/10:55:25 AM    Final    DG Chest Port 1 View  Result Date: 06/06/2022 CLINICAL DATA:  Hypoxia EXAM: PORTABLE CHEST 1 VIEW COMPARISON:  Chest x-ray dated June 05, 2022 FINDINGS: ETT, left subclavian line and enteric tube are unchanged in position. Unchanged position of transvenous pacing line. Stable right-sided chest tube. Cardiac and mediastinal contours within normal limits. Increased right basilar opacity. No large pleural effusion or evidence of pneumothorax. IMPRESSION: 1. Increased right basilar opacity, likely due to worsened atelectasis. 2. Stable right-sided chest tube.  No evidence of pneumothorax. 3. Stable support apparatus. Electronically Signed   By: Yetta Glassman M.D.   On: 06/06/2022 08:23   DG CHEST PORT 1 VIEW  Result Date: 06/03/2022 CLINICAL DATA:  [id number] S/P chest tube placement [facility]) [id number] EXAM: PORTABLE CHEST 1 VIEW COMPARISON:  Radiograph 05/23/2022 FINDINGS: Endotracheal tube overlies the midthoracic trachea approximately 5.0 cm above the carina. Orogastric tube tip and side port overlie the stomach.  Defibrillator pads overlie the chest. There is a left approach central venous catheter with tip overlying the mid SVC. Right neck catheter overlies the right atrium, obscured by a defibrillator pad. Unchanged cardiomediastinal silhouette. Interval right apical pigtail chest tube placement with significantly decreased right-sided pneumothorax. No visible residual pneumothorax identified. Expansion of the right lung with moderate distal airspace disease. Suspected trace effusions. There are right upper rib fractures, involving  ribs 4 and 5. Subcutaneous emphysema along the right chest wall. IMPRESSION: Decreased right pneumothorax after apical chest tube placement, without residual pneumothorax identified. Re-expansion of the right lung with interstitial and airspace opacities, which could represent re-expansion edema. Right lateral fourth and fifth rib fractures. Electronically Signed   By: Maurine Simmering M.D.   On: 06/02/2022 14:31   DG CHEST PORT 1 VIEW  Result Date: 06/04/2022 CLINICAL DATA:  [id number] Encounter for central line placement [id number] EXAM: PORTABLE CHEST 1 VIEW COMPARISON:  Chest XR, 06/06/2022.  CT chest, 04/15/2022. FINDINGS: Support lines: Intubation with well-positioned ETT and tip at the level of the clavicular heads. RIGHT IJ sheath with monitoring catheter tip within the RV. We with enteric decompression tube with tip and side port projecting inferior to the diaphragm and within the stomach. Numerous overlying cutaneous pacers and leads. Cardiomediastinal silhouette is within normal limits given technique and degree of inflation. The LEFT lung is well inflated. New, at least moderate volume RIGHT pneumothorax. No pleural effusion No acute displaced fracture. IMPRESSION: 1. New, at least moderate volume RIGHT pneumothorax. 2. Well-positioned ETT with catheter tip at the midthoracic trachea. Additional lines and tubes as above. These results were called by telephone at the time of interpretation on  06/17/2022 at 10:46 am to provider [facility] , who verbally acknowledged these results. Electronically Signed   By: Michaelle Birks M.D.   On: 06/17/2022 10:48   CARDIAC CATHETERIZATION  Result Date: 05/26/2022 1.  Successful temporary pacemaker placement from the right internal jugular site with threshold of 0.2 mA; the pacemaker was set to an output of 10 mA with a rate of 80 bpm. 2.  Successful radial arterial line placement. Recommendation: Due to pH is 7.14 with a bicarbonate of 16, 3 A of bicarbonate were administered in the lab.  The patient will be transported to the [place] unit for further evaluation and treatment.  The patient's family was updated on the procedure.   DG Chest Port 1 View  Result Date: 05/21/2022 CLINICAL DATA:  Shortness of breath. EXAM: PORTABLE CHEST 1 VIEW COMPARISON:  03/10/2007. FINDINGS: The heart size and mediastinal contours are within normal limits. There is atherosclerotic calcification of the aorta. Patchy airspace disease is noted in the mid right lung. There is subsegmental atelectasis at the left lung base. No effusion or pneumothorax. 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 Cervical spinal fusion hardware is present. No acute osseous abnormality. IMPRESSION: Patchy airspace disease in the mid right lung, concerning for developing infiltrates. Electronically Signed   By: Brett Fairy M.D.   On: 06/09/2022 04:13    Microbiology No results found for this or any previous visit (from the past 240 hour(s)).  Lab Basic Metabolic Panel: No results for input(s): "NA", "K", "CL", "CO2", "GLUCOSE", "BUN", "CREATININE", "CALCIUM", "MG", "PHOS" in the last 168 hours. Liver Function Tests: No results for input(s): "AST", "ALT", "ALKPHOS", "BILITOT", "PROT", "ALBUMIN" in the last 168 hours. No results for input(s): "LIPASE", "AMYLASE" in the last 168 hours. No results for input(s): "AMMONIA" in the last 168 hours. CBC: No results for input(s): "WBC",  "NEUTROABS", "HGB", "HCT", "MCV", "PLT" in the last 168 hours. Cardiac Enzymes: No results for input(s): "CKTOTAL", "CKMB", "CKMBINDEX", "TROPONINI" in the last 168 hours. Sepsis Labs: No results for input(s): "PROCALCITON", "WBC", "LATICACIDVEN" in the last 168 hours.  Procedures/Operations     [employer] 06/24/2022, 6:00 PM
